# Patient Record
Sex: Male | Born: 1940 | Race: White | Hispanic: No | Marital: Married | Smoking: Former smoker
Health system: Southern US, Community
[De-identification: ages and names within clinical notes are randomized; demographics above are authoritative.]

## PROBLEM LIST (undated history)

## (undated) ENCOUNTER — Emergency Department (HOSPITAL_COMMUNITY): Payer: Medicare PPO

## (undated) DIAGNOSIS — I471 Supraventricular tachycardia, unspecified: Secondary | ICD-10-CM

## (undated) DIAGNOSIS — Z9289 Personal history of other medical treatment: Secondary | ICD-10-CM

## (undated) DIAGNOSIS — E7800 Pure hypercholesterolemia, unspecified: Secondary | ICD-10-CM

## (undated) DIAGNOSIS — M1990 Unspecified osteoarthritis, unspecified site: Secondary | ICD-10-CM

## (undated) DIAGNOSIS — R918 Other nonspecific abnormal finding of lung field: Secondary | ICD-10-CM

## (undated) DIAGNOSIS — I712 Thoracic aortic aneurysm, without rupture, unspecified: Secondary | ICD-10-CM

## (undated) DIAGNOSIS — Z8669 Personal history of other diseases of the nervous system and sense organs: Secondary | ICD-10-CM

## (undated) DIAGNOSIS — I259 Chronic ischemic heart disease, unspecified: Secondary | ICD-10-CM

## (undated) DIAGNOSIS — Z972 Presence of dental prosthetic device (complete) (partial): Secondary | ICD-10-CM

## (undated) DIAGNOSIS — I2510 Atherosclerotic heart disease of native coronary artery without angina pectoris: Secondary | ICD-10-CM

## (undated) DIAGNOSIS — Z8701 Personal history of pneumonia (recurrent): Secondary | ICD-10-CM

## (undated) DIAGNOSIS — I10 Essential (primary) hypertension: Secondary | ICD-10-CM

## (undated) DIAGNOSIS — Z973 Presence of spectacles and contact lenses: Secondary | ICD-10-CM

## (undated) DIAGNOSIS — K219 Gastro-esophageal reflux disease without esophagitis: Secondary | ICD-10-CM

## (undated) DIAGNOSIS — I4892 Unspecified atrial flutter: Secondary | ICD-10-CM

## (undated) DIAGNOSIS — I4819 Other persistent atrial fibrillation: Secondary | ICD-10-CM

## (undated) HISTORY — DX: Pure hypercholesterolemia, unspecified: E78.00

## (undated) HISTORY — PX: TONSILLECTOMY: SUR1361

## (undated) HISTORY — DX: Thoracic aortic aneurysm, without rupture, unspecified: I71.20

## (undated) HISTORY — DX: Supraventricular tachycardia, unspecified: I47.10

## (undated) HISTORY — DX: Personal history of other medical treatment: Z92.89

## (undated) HISTORY — DX: Chronic ischemic heart disease, unspecified: I25.9

## (undated) HISTORY — DX: Unspecified atrial flutter: I48.92

## (undated) HISTORY — PX: OTHER SURGICAL HISTORY: SHX169

## (undated) HISTORY — DX: Essential (primary) hypertension: I10

## (undated) HISTORY — DX: Thoracic aortic aneurysm, without rupture: I71.2

## (undated) HISTORY — PX: COLONOSCOPY: SHX174

## (undated) HISTORY — DX: Personal history of other diseases of the nervous system and sense organs: Z86.69

## (undated) HISTORY — DX: Other persistent atrial fibrillation: I48.19

## (undated) HISTORY — DX: Other nonspecific abnormal finding of lung field: R91.8

## (undated) HISTORY — PX: EYE SURGERY: SHX253

## (undated) HISTORY — DX: Supraventricular tachycardia: I47.1

---

## 1973-04-09 HISTORY — PX: RETINAL DETACHMENT SURGERY: SHX105

## 1995-04-10 HISTORY — PX: OTHER SURGICAL HISTORY: SHX169

## 1995-04-10 HISTORY — PX: ELBOW SURGERY: SHX618

## 1997-10-12 ENCOUNTER — Ambulatory Visit (HOSPITAL_COMMUNITY): Admission: RE | Admit: 1997-10-12 | Discharge: 1997-10-12 | Payer: Self-pay | Admitting: Neurological Surgery

## 1998-06-03 ENCOUNTER — Ambulatory Visit (HOSPITAL_BASED_OUTPATIENT_CLINIC_OR_DEPARTMENT_OTHER): Admission: RE | Admit: 1998-06-03 | Discharge: 1998-06-03 | Payer: Self-pay | Admitting: Orthopedic Surgery

## 2000-07-09 ENCOUNTER — Ambulatory Visit (HOSPITAL_COMMUNITY): Admission: RE | Admit: 2000-07-09 | Discharge: 2000-07-09 | Payer: Self-pay | Admitting: Gastroenterology

## 2002-04-09 HISTORY — PX: CORONARY STENT PLACEMENT: SHX1402

## 2002-06-22 ENCOUNTER — Ambulatory Visit (HOSPITAL_COMMUNITY): Admission: RE | Admit: 2002-06-22 | Discharge: 2002-06-23 | Payer: Self-pay | Admitting: Cardiology

## 2002-07-22 ENCOUNTER — Ambulatory Visit (HOSPITAL_COMMUNITY): Admission: RE | Admit: 2002-07-22 | Discharge: 2002-07-22 | Payer: Self-pay | Admitting: Cardiology

## 2002-07-22 ENCOUNTER — Encounter: Payer: Self-pay | Admitting: Cardiology

## 2003-09-03 ENCOUNTER — Ambulatory Visit (HOSPITAL_COMMUNITY): Admission: RE | Admit: 2003-09-03 | Discharge: 2003-09-03 | Payer: Self-pay | Admitting: Cardiology

## 2003-09-09 ENCOUNTER — Encounter: Admission: RE | Admit: 2003-09-09 | Discharge: 2003-09-09 | Payer: Self-pay | Admitting: Cardiology

## 2003-11-24 ENCOUNTER — Emergency Department (HOSPITAL_COMMUNITY): Admission: EM | Admit: 2003-11-24 | Discharge: 2003-11-24 | Payer: Self-pay | Admitting: Family Medicine

## 2004-04-17 ENCOUNTER — Encounter: Admission: RE | Admit: 2004-04-17 | Discharge: 2004-04-17 | Payer: Self-pay | Admitting: Internal Medicine

## 2004-09-05 ENCOUNTER — Emergency Department (HOSPITAL_COMMUNITY): Admission: EM | Admit: 2004-09-05 | Discharge: 2004-09-05 | Payer: Self-pay | Admitting: Nurse Practitioner

## 2005-11-05 ENCOUNTER — Encounter: Admission: RE | Admit: 2005-11-05 | Discharge: 2005-11-05 | Payer: Self-pay | Admitting: Cardiology

## 2005-12-06 ENCOUNTER — Encounter: Admission: RE | Admit: 2005-12-06 | Discharge: 2005-12-06 | Payer: Self-pay | Admitting: Internal Medicine

## 2006-01-01 ENCOUNTER — Ambulatory Visit (HOSPITAL_COMMUNITY): Admission: RE | Admit: 2006-01-01 | Discharge: 2006-01-01 | Payer: Self-pay | Admitting: Gastroenterology

## 2007-02-10 ENCOUNTER — Encounter: Admission: RE | Admit: 2007-02-10 | Discharge: 2007-02-10 | Payer: Self-pay | Admitting: Internal Medicine

## 2008-02-10 ENCOUNTER — Encounter: Admission: RE | Admit: 2008-02-10 | Discharge: 2008-02-10 | Payer: Self-pay | Admitting: Internal Medicine

## 2009-05-09 ENCOUNTER — Encounter: Admission: RE | Admit: 2009-05-09 | Discharge: 2009-05-09 | Payer: Self-pay | Admitting: Cardiology

## 2009-11-10 ENCOUNTER — Ambulatory Visit: Payer: Self-pay | Admitting: Cardiology

## 2009-11-20 ENCOUNTER — Encounter: Admission: RE | Admit: 2009-11-20 | Discharge: 2009-11-20 | Payer: Self-pay | Admitting: Internal Medicine

## 2010-02-10 ENCOUNTER — Ambulatory Visit: Payer: Self-pay | Admitting: Cardiology

## 2010-03-09 HISTORY — PX: CERVICAL FUSION: SHX112

## 2010-03-14 ENCOUNTER — Inpatient Hospital Stay (HOSPITAL_COMMUNITY)
Admission: RE | Admit: 2010-03-14 | Discharge: 2010-03-17 | Payer: Self-pay | Source: Home / Self Care | Attending: Neurosurgery | Admitting: Neurosurgery

## 2010-05-09 ENCOUNTER — Other Ambulatory Visit: Payer: Self-pay | Admitting: Cardiology

## 2010-05-09 DIAGNOSIS — I719 Aortic aneurysm of unspecified site, without rupture: Secondary | ICD-10-CM

## 2010-05-10 ENCOUNTER — Other Ambulatory Visit (INDEPENDENT_AMBULATORY_CARE_PROVIDER_SITE_OTHER): Payer: Medicare Other

## 2010-05-10 DIAGNOSIS — I2510 Atherosclerotic heart disease of native coronary artery without angina pectoris: Secondary | ICD-10-CM

## 2010-05-10 DIAGNOSIS — I10 Essential (primary) hypertension: Secondary | ICD-10-CM

## 2010-05-10 DIAGNOSIS — E7800 Pure hypercholesterolemia, unspecified: Secondary | ICD-10-CM

## 2010-05-12 ENCOUNTER — Ambulatory Visit
Admission: RE | Admit: 2010-05-12 | Discharge: 2010-05-12 | Disposition: A | Discharge status: Home / Self Care | Payer: Medicare Other | Source: Ambulatory Visit | Attending: Cardiology | Admitting: Cardiology

## 2010-05-12 DIAGNOSIS — I719 Aortic aneurysm of unspecified site, without rupture: Secondary | ICD-10-CM

## 2010-05-12 MED ORDER — IOHEXOL 300 MG/ML  SOLN: 125.0000 mL | Freq: Once | INTRAMUSCULAR | Status: AC | PRN

## 2010-05-29 ENCOUNTER — Ambulatory Visit (INDEPENDENT_AMBULATORY_CARE_PROVIDER_SITE_OTHER): Payer: Medicare Other | Admitting: Cardiology

## 2010-05-29 DIAGNOSIS — I471 Supraventricular tachycardia: Secondary | ICD-10-CM

## 2010-06-19 LAB — APTT: aPTT: 28 seconds (ref 24–37)

## 2010-06-19 LAB — URINALYSIS, ROUTINE W REFLEX MICROSCOPIC
Bilirubin Urine: NEGATIVE
Glucose, UA: NEGATIVE mg/dL
Protein, ur: NEGATIVE mg/dL
Specific Gravity, Urine: 1.021 (ref 1.005–1.030)
Urobilinogen, UA: 0.2 mg/dL (ref 0.0–1.0)

## 2010-06-19 LAB — DIFFERENTIAL
Basophils Absolute: 0 10*3/uL (ref 0.0–0.1)
Eosinophils Absolute: 0 10*3/uL (ref 0.0–0.7)
Eosinophils Relative: 1 % (ref 0–5)
Monocytes Absolute: 0.5 10*3/uL (ref 0.1–1.0)

## 2010-06-19 LAB — CBC
MCHC: 33.9 g/dL (ref 30.0–36.0)
Platelets: 224 10*3/uL (ref 150–400)
RBC: 4.77 MIL/uL (ref 4.22–5.81)
RDW: 12.7 % (ref 11.5–15.5)
WBC: 5.2 10*3/uL (ref 4.0–10.5)

## 2010-06-19 LAB — CROSSMATCH
ABO/RH(D): O POS
Unit division: 0
Unit division: 0

## 2010-06-19 LAB — COMPREHENSIVE METABOLIC PANEL
AST: 22 U/L (ref 0–37)
BUN: 17 mg/dL (ref 6–23)
Chloride: 107 mEq/L (ref 96–112)
Creatinine, Ser: 1.03 mg/dL (ref 0.4–1.5)
GFR calc Af Amer: >60 mL/min (ref >60)
Potassium: 3.9 mEq/L (ref 3.5–5.1)
Total Protein: 6.8 g/dL (ref 6.0–8.3)

## 2010-06-19 LAB — POCT I-STAT 4, (NA,K, GLUC, HGB,HCT)
Glucose, Bld: 119 mg/dL — ABNORMAL HIGH (ref 70–99)
HCT: 36 % — ABNORMAL LOW (ref 39.0–52.0)
Hemoglobin: 12.2 g/dL — ABNORMAL LOW (ref 13.0–17.0)
Potassium: 3.9 mEq/L (ref 3.5–5.1)

## 2010-06-19 LAB — PROTIME-INR
INR: 1.01 (ref 0.00–1.49)
Prothrombin Time: 13.5 seconds (ref 11.6–15.2)

## 2010-06-19 LAB — URINE MICROSCOPIC-ADD ON

## 2010-06-19 LAB — SURGICAL PCR SCREEN: Staphylococcus aureus: NEGATIVE

## 2010-06-19 LAB — GLUCOSE, CAPILLARY: Glucose-Capillary: 120 mg/dL — ABNORMAL HIGH (ref 70–99)
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
## 2010-08-25 NOTE — Cardiovascular Report (Signed)
NAME:  SPIKE, DESILETS NO.:  [id number]   MEDICAL RECORD NO.:  [id number]                   PATIENT TYPE:  OIB   LOCATION:  2870                                 FACILITY:  [facility]   PHYSICIAN:  Colleen Can. Deborah Chalk, M.D.            DATE OF BIRTH:  1940-07-24   DATE OF PROCEDURE:  06/22/2002  DATE OF DISCHARGE:                              CARDIAC CATHETERIZATION   HISTORY:  The patient presented with substernal chest pain, had abnormal  stress Cardiolite study.  He had no ischemia, but had a fixed inferior scar  without redistribution.  With that, he is referred for catheterization.   PROCEDURES:  1. Left heart catheterization with selective coronary angiography.  2. Left ventricular angiography.  3. Stent placement to the right coronary artery.   TYPE AND SITE OF ENTRY:  Percutaneous right femoral artery.   CATHETERS:  A 6 French 4 curved Judkins right and left coronary catheters, 6  French 5 curved Judkins left coronary catheter, a Amplatz L2 curved Judkins  left coronary catheter.   CONTRAST MATERIAL:  Omnipaque.   MEDICATIONS GIVEN PRIOR TO THE PROCEDURE:  Valium 10 mg p.o.   MEDICATIONS GIVEN DURING THE PROCEDURE:  Versed 2 mg IV, heparin,  Integrilin, IV nitroglycerin.   COMMENTS:  The patient tolerated the procedure well.  There is a dual ostium  of the left coronary artery that was difficult to engage and subsequently  was engaged with Amplatz catheters.   HEMODYNAMIC DATA:  The aortic pressure 114/93, LV is 107/1-22.  There was no  aortic gradient noted on pullback.   LEFT VENTRICULOGRAPHY:  Left ventricular angiogram was performed in the RAO  position.  Overall cardiac size and silhouette were normal.  There was mild  inferior hypokinesis.  The global ejection fraction would be estimated to be  55%.   CORONARY ARTERIOGRAPHY:  1. 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  There is a dual ostium of     the left circumflex and  left anterior descending.  2. Left circumflex:  The left circumflex arises with an intermediate     coronary with minimal irregularities.  The left circumflex continues as     an obtuse marginal and has 40-60% narrowing in its midportion.  There is     a small continuation branch of the left circumflex that has a subtotal     obstruction.  It would be small and would not be suitable for bypass     grafting. There clearly are irregularities in the main portion of this     obtuse marginal and proximal left circumflex.  3. Left anterior descending:  The left anterior descending was difficult to     engage.  There is no left main coronary artery present.  The left     anterior descending is a moderate sized and free of significant     obstructive disease.  4. Right coronary  artery:  The right coronary artery is a large dominant     vessel.  There is a 90% focal stenosis proximally before the first bend     in the branch.  There is a 60-70% stenosis in the midportion of the     posterior descending vessel, although the posterior descending is a     relatively small vessel.  There is a continuation branch in the right     coronary artery of the posterolateral branch and there is lumpy, bumpy     atherosclerosis scattered throughout the vessel.   ANGIOPLASTY PROCEDURE:  Using the JR4 guide as a pull guide, a 7 [place], we  easily placed the catheter in the ostia of the right coronary artery and Hi-  Torque Floppy guide wire was passed distally.  It was felt that a 3.5 x 18  mm stent was appropriate in size, although the proximal portion of the  vessel appeared to be 4.0 mm in diameter.  The stent was placed and inflated  to a maximum of 20 atmospheres.  Subsequently, returned with a 8 mm x 4.0 mm  Quantum Maverick balloon inflated to a maximum of 22 atmospheres to provide  a proximal flare to the stent of approximately 4.25 mm proximally.  The  final angiographic result was excellent with  essentially no residual  stenosis and no luminal irregularities after the stent was placed.   OVERALL IMPRESSION:  1. Well preserved global left ventricular function with minimal inferior     hypokinesis.  2. Successful stent placement to the proximal right coronary artery.  3. Residual disease in the posterior descending vessel and in the     continuation branch of the left circumflex with moderate proximal left     circumflex disease.  4. 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                                               Colleen Can. Deborah Chalk, M.D.    SNT/MEDQ  D:  06/22/2002  T:  06/22/2002  Job:  [id number]   cc:   Loraine Leriche A. Waynard Edwards, M.D.  [address]  [place]  [place] [address]  Fax: [phone]   Barry Dienes. Eloise Harman, M.D.  [address]  [place]  [place] [address]  Fax: [phone]
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
## 2010-08-25 NOTE — Cardiovascular Report (Signed)
NAME:  Ronald [name], DENZ NO.:  [id number]   MEDICAL RECORD NO.:  [id number]                   PATIENT TYPE:  OIB   LOCATION:  2861                                 FACILITY:  [facility]   PHYSICIAN:  Colleen Can. Deborah Chalk, M.D.            DATE OF BIRTH:  1941/04/03   DATE OF PROCEDURE:  09/03/2003  DATE OF DISCHARGE:  09/03/2003                              CARDIAC CATHETERIZATION   HISTORY:  Mr. Folz has had previous stent placement of the right coronary  artery.  He presents with atypical chest pain.   PROCEDURE:  1. Left heart catheterization with selective coronary angiography.  2. Left ventricular angiography.  3. Angio-Seal.   TYPE AND SITE OF ENTRY:  Percutaneous right femoral artery.   CATHETERS:  1. 6 French L2 and L3 Amplatz catheters.  2. 6 French right coronary catheter.  3. 6 French pigtail ventriculographic catheter.   CONTRAST MATERIAL:  Omnipaque.   COMMENTS:  The patient tolerated the procedure well.   HEMODYNAMIC DATA:  The aortic pressure was 112/73, LV was 114/15-18.   ANGIOGRAPHIC DATA:  1. The left main coronary artery is basically nonexistent and there is a     dual ostium to the left system.  2. Left circumflex:  There is a left circumflex with two large marginal     vessels.  The uppermost one would probably be what would be considered to     be an intermediate type distribution.  The lower marginal vessel is large     with a 30-40% narrowing.  There is initial concern that this may be more     severe, but in evaluation, there appears to be  branches that arise at     this location and there is no greater than 30-40% narrowing noted.  3. Left anterior descending.  Left anterior descending is a reasonably large     vessel that extends to the apex.  It has irregularities but no     significant focal narrowing.  4. Right coronary artery is a large dominant vessel.  The stent in the     proximal right coronary artery is widely  patent with no narrowing.     Posterior descending vessel has a 40-50% narrowing in midportion, there     is a very large posterolateral branch.   LEFT VENTRICULAR ANGIOGRAM:  Left ventricular angiogram was performed in the  RAO position.  Overall cardiac size was at the upper limits of normal.  The  regional wall motion was felt to be normal with a global ejection fraction  in the 50% range.  The aortic root was large and almost perpendicular to the  left ventricular cavity.   OVERALL IMPRESSION:  1. Normal left ventricular function.  2. Probable enlarged aortic root.  3. Patent stent in the right coronary artery with a distal 40-50% narrowing;     and a 40-50% narrowing in a left  circumflex marginal vessel.   DISCUSSION:  It is felt that Mr. Meenach probably does not have significant  ischemia or obstructive coronary disease at this point in time.  I think we  can manage him medically.  His aortic root is prominent and I would like to  make sure we don't have any excessive dilatation of his aortic root and have  a noninvasive way to monitor that and we will obtain a CT scan of the chest.                                               Colleen Can. Deborah Chalk, M.D.    SNT/MEDQ  D:  09/03/2003  T:  09/05/2003  Job:  [id number]

## 2010-09-29 ENCOUNTER — Other Ambulatory Visit: Payer: Self-pay | Admitting: Cardiovascular Disease

## 2010-09-29 DIAGNOSIS — I714 Abdominal aortic aneurysm, without rupture: Secondary | ICD-10-CM

## 2010-11-09 ENCOUNTER — Ambulatory Visit: Payer: Medicare Other | Admitting: Cardiovascular Disease

## 2010-11-13 ENCOUNTER — Encounter: Payer: Self-pay | Admitting: *Deleted

## 2010-11-14 ENCOUNTER — Ambulatory Visit (INDEPENDENT_AMBULATORY_CARE_PROVIDER_SITE_OTHER): Payer: Medicare Other | Admitting: Cardiovascular Disease

## 2010-11-14 ENCOUNTER — Encounter: Payer: Self-pay | Admitting: Cardiovascular Disease

## 2010-11-14 VITALS — BP 102/72 | HR 62 | Ht 79.0 in

## 2010-11-14 DIAGNOSIS — I712 Thoracic aortic aneurysm, without rupture, unspecified: Secondary | ICD-10-CM

## 2010-11-14 DIAGNOSIS — E785 Hyperlipidemia, unspecified: Secondary | ICD-10-CM

## 2010-11-14 DIAGNOSIS — I2510 Atherosclerotic heart disease of native coronary artery without angina pectoris: Secondary | ICD-10-CM

## 2010-11-14 LAB — LIPID PANEL
HDL: 48.6 mg/dL (ref >39.00)
LDL Cholesterol: 107 mg/dL — ABNORMAL HIGH (ref 0–99)
Total CHOL/HDL Ratio: 4

## 2010-11-14 LAB — HEPATIC FUNCTION PANEL
AST: 24 U/L (ref 0–37)
Alkaline Phosphatase: 52 U/L (ref 39–117)
Total Bilirubin: 0.9 mg/dL (ref 0.3–1.2)

## 2010-11-14 NOTE — Patient Instructions (Signed)
Your physician recommends that you schedule a follow-up appointment in: 6 months  Your physician has requested that you have an echocardiogram. Echocardiography is a painless test that uses sound waves to create images of your heart. It provides your doctor with information about the size and shape of your heart and how well your heart's chambers and valves are working. This procedure takes approximately one hour. There are no restrictions for this procedure.    

## 2010-11-14 NOTE — Assessment & Plan Note (Addendum)
Stable. No changes. Will get echo to assess LV function. Will check lipids today.

## 2010-11-14 NOTE — Assessment & Plan Note (Signed)
Stable by CTA February 2012. Will repeat February 2013.

## 2010-11-14 NOTE — Progress Notes (Signed)
History of Present Illness:69 yo WM with h/o CAD s/p stent RCA 2004, HTN, HLD, dilated aortic root (Ascending aortic aneurysm)  here today for cardiac follow up. He has been followed in the past by Dr. Deborah Chalk. His last CTA chest was February 2012. Last stress test was 3/10 and no ischemia.   He has had no chest pain or SOB. He is not limited by any dizziness, weakness. He does not tolerate statins which caused muscle aches and cramping.   Past Medical History  Diagnosis Date  . Hypertension   . Hypercholesteremia     managed by dr Waynard Edwards  . 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   . Thoracic aortic aneurysm     Past Surgical History  Procedure Date  . Cervical fusion     c2-c7  . Rotator cuff surgery   . Elbow surgery     Current Outpatient Prescriptions  Medication Sig Dispense Refill  . aspirin 325 MG tablet Take 325 mg by mouth daily.        . Cholecalciferol (VITAMIN D) 1000 UNITS capsule Take 1,000 Units by mouth daily.        . Coenzyme Q10 (COQ-10) 200 MG CAPS Take 1 capsule by mouth daily.        . colesevelam (WELCHOL) 625 MG tablet Take 1,875 mg by mouth 2 (two) times daily with a meal.        . ezetimibe (ZETIA) 10 MG tablet Take 5 mg by mouth daily.        . fenofibrate 160 MG tablet Take 160 mg by mouth daily.        . Glucosamine-Chondroit-Vit C-Mn (GLUCOSAMINE CHONDR 1500 COMPLX) CAPS Take 2 capsules by mouth daily.        Marland Kitchen lisinopril (PRINIVIL,ZESTRIL) 40 MG tablet Take 20 mg by mouth daily.        . Omega-3 Fatty Acids (FISH OIL) 1000 MG CAPS Take 6 capsules by mouth daily.        Marland Kitchen omeprazole (PRILOSEC) 20 MG capsule Take 20 mg by mouth daily.          Allergies  Allergen Reactions  . Statins   . Phenergan     History   Social History  . Marital Status: Married    Spouse Name: N/A    Number of Children: 1  . Years of Education: N/A   Occupational  History  . retired Engineer, site    Social History Main Topics  . Smoking status: Former Games developer  . Smokeless tobacco: Former Neurosurgeon  . Alcohol Use: Yes     occasional  . Drug Use: Not on file  . Sexually Active: Not on file   Other Topics Concern  . Not on file   Social History Narrative  . No narrative on file    Family History  Problem Relation Age of Onset  . Heart disease Father     Review of Systems:  As stated in the HPI and otherwise negative.   BP 102/72  Pulse 62  Ht 6\' 7"  (2.007 m)  Physical Examination: General: Well developed, well nourished, NAD HEENT: OP clear, mucus membranes moist SKIN: warm, dry. No rashes. Neuro: No focal deficits Musculoskeletal: Muscle strength 5/5 all ext Psychiatric: Mood and affect normal Neck: No JVD, no carotid bruits, no thyromegaly, no lymphadenopathy. Lungs:Clear bilaterally, no wheezes, rhonci,  crackles Cardiovascular: Regular rate and rhythm. No murmurs, gallops or rubs. Abdomen:Soft. Bowel sounds present. Non-tender.  Extremities: No lower extremity edema. Pulses are 2 + in the bilateral DP/PT.  EKG:NSR, rate 62 bpm. Incomplete RBBB.

## 2010-11-15 ENCOUNTER — Telehealth: Payer: Self-pay | Admitting: Cardiovascular Disease

## 2010-11-15 NOTE — Telephone Encounter (Signed)
Rescheduled patients blood work.

## 2010-11-15 NOTE — Telephone Encounter (Signed)
Pt has lab work Friday and was in yesterday, had lab work , wants to make sure he still needs to come friday

## 2010-11-16 ENCOUNTER — Encounter: Payer: Self-pay | Admitting: *Deleted

## 2010-11-17 ENCOUNTER — Other Ambulatory Visit: Payer: Medicare Other | Admitting: *Deleted

## 2010-11-27 ENCOUNTER — Ambulatory Visit (INDEPENDENT_AMBULATORY_CARE_PROVIDER_SITE_OTHER): Payer: Medicare Other | Admitting: *Deleted

## 2010-11-27 ENCOUNTER — Ambulatory Visit (HOSPITAL_COMMUNITY): Payer: Medicare Other | Attending: Cardiovascular Disease | Admitting: Radiology

## 2010-11-27 DIAGNOSIS — I712 Thoracic aortic aneurysm, without rupture, unspecified: Secondary | ICD-10-CM | POA: Insufficient documentation

## 2010-11-27 DIAGNOSIS — E785 Hyperlipidemia, unspecified: Secondary | ICD-10-CM | POA: Insufficient documentation

## 2010-11-27 DIAGNOSIS — I079 Rheumatic tricuspid valve disease, unspecified: Secondary | ICD-10-CM | POA: Insufficient documentation

## 2010-11-27 DIAGNOSIS — I2510 Atherosclerotic heart disease of native coronary artery without angina pectoris: Secondary | ICD-10-CM

## 2010-11-27 DIAGNOSIS — I10 Essential (primary) hypertension: Secondary | ICD-10-CM | POA: Insufficient documentation

## 2010-11-27 DIAGNOSIS — I080 Rheumatic disorders of both mitral and aortic valves: Secondary | ICD-10-CM | POA: Insufficient documentation

## 2010-11-27 LAB — BASIC METABOLIC PANEL
CO2: 27 mEq/L (ref 19–32)
Calcium: 9.4 mg/dL (ref 8.4–10.5)
Creatinine, Ser: 1 mg/dL (ref 0.4–1.5)
GFR: 78.08 mL/min (ref >60.00)
Glucose, Bld: 115 mg/dL — ABNORMAL HIGH (ref 70–99)
Sodium: 142 mEq/L (ref 135–145)

## 2011-05-11 ENCOUNTER — Other Ambulatory Visit: Payer: Self-pay | Admitting: Cardiovascular Disease

## 2011-05-11 LAB — CREATININE, SERUM: Creat: 0.9 mg/dL (ref 0.50–1.35)

## 2011-05-14 ENCOUNTER — Ambulatory Visit
Admission: RE | Admit: 2011-05-14 | Discharge: 2011-05-14 | Disposition: A | Discharge status: Home / Self Care | Payer: Medicare Other | Source: Ambulatory Visit | Attending: Cardiovascular Disease | Admitting: Cardiovascular Disease

## 2011-05-14 DIAGNOSIS — I714 Abdominal aortic aneurysm, without rupture: Secondary | ICD-10-CM

## 2011-05-14 MED ORDER — IOHEXOL 300 MG/ML  SOLN
100.0000 mL | Freq: Once | INTRAMUSCULAR | Status: AC | PRN
  Administered 2011-05-14: 100 mL via INTRAVENOUS

## 2011-05-17 ENCOUNTER — Ambulatory Visit: Payer: Medicare Other | Admitting: Cardiovascular Disease

## 2011-06-04 ENCOUNTER — Ambulatory Visit (INDEPENDENT_AMBULATORY_CARE_PROVIDER_SITE_OTHER): Payer: Medicare Other | Admitting: Cardiovascular Disease

## 2011-06-04 ENCOUNTER — Encounter: Payer: Self-pay | Admitting: Cardiovascular Disease

## 2011-06-04 VITALS — BP 127/76 | HR 68 | Wt 289.8 lb

## 2011-06-04 DIAGNOSIS — I712 Thoracic aortic aneurysm, without rupture, unspecified: Secondary | ICD-10-CM

## 2011-06-04 DIAGNOSIS — I2510 Atherosclerotic heart disease of native coronary artery without angina pectoris: Secondary | ICD-10-CM

## 2011-06-04 NOTE — Progress Notes (Signed)
History of Present Illness: 71 yo WM with h/o CAD s/p stent RCA 2004, HTN, HLD, dilated aortic root (Ascending aortic aneurysm) here today for cardiac follow up. He has been followed in the past by Dr. Deborah Chalk. His last CTA chest was February 2012. Last stress test was March 2010 and there was no ischemia. Echo August 2012 with LVEF 55-60%. CTA chest outlined below with 5.0 cm thoracic aortic aneurysm. This has grown 0.6 mm in the last year.   He tells me today that he has had no chest pain or SOB. He is not limited by any dizziness, weakness. He does not tolerate statins which caused muscle aches and cramping. He has been active.    Primary Care Physician: [name]  Last Lipid Profile: August 2012 with LDL 107, total chol 143  HDL 49  Past Medical History  Diagnosis Date  . Hypertension   . Hypercholesteremia     managed by dr Waynard Edwards  . 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   . Thoracic aortic aneurysm     Past Surgical History  Procedure Date  . Cervical fusion     c2-c7  . Rotator cuff surgery   . Elbow surgery   . Retinal detachment surgery     Current Outpatient Prescriptions  Medication Sig Dispense Refill  . aspirin 325 MG tablet Take 325 mg by mouth daily.        . Cholecalciferol (VITAMIN D) 1000 UNITS capsule Take 1,000 Units by mouth daily.        . Coenzyme Q10 (COQ-10) 200 MG CAPS Take 1 capsule by mouth daily.        . colesevelam (WELCHOL) 625 MG tablet Take 1,875 mg by mouth 2 (two) times daily with a meal.        . ezetimibe (ZETIA) 10 MG tablet Take 5 mg by mouth daily.        . fenofibrate 160 MG tablet Take 160 mg by mouth daily.        . Glucosamine-Chondroit-Vit C-Mn (GLUCOSAMINE CHONDR 1500 COMPLX) CAPS Take 2 capsules by mouth daily.        Marland Kitchen lisinopril (PRINIVIL,ZESTRIL) 40 MG tablet Take 20 mg by mouth daily.        . Omega-3 Fatty Acids (FISH OIL)  1000 MG CAPS Take 6 capsules by mouth daily.        Marland Kitchen omeprazole (PRILOSEC) 20 MG capsule Take 20 mg by mouth daily.          Allergies  Allergen Reactions  . Statins   . Phenergan     History   Social History  . Marital Status: Married    Spouse Name: N/A    Number of Children: 1  . Years of Education: N/A   Occupational History  . retired Engineer, site    Social History Main Topics  . Smoking status: Former Games developer  . Smokeless tobacco: Former Neurosurgeon  . Alcohol Use: Yes     occasional  . Drug Use: Not on file  . Sexually Active: Not on file   Other Topics Concern  . Not on file   Social History Narrative  . No narrative on file    Family History  Problem Relation Age of Onset  . Heart disease Father     Review of Systems:  As stated in the HPI  and otherwise negative.   BP 127/76  Pulse 68  Wt 289 lb 12.8 oz (131.452 kg)  Physical Examination: General: Well developed, well nourished, NAD HEENT: OP clear, mucus membranes moist SKIN: warm, dry. No rashes. Neuro: No focal deficits Musculoskeletal: Muscle strength 5/5 all ext Psychiatric: Mood and affect normal Neck: No JVD, no carotid bruits, no thyromegaly, no lymphadenopathy. Lungs:Clear bilaterally, no wheezes, rhonci, crackles Cardiovascular: Regular rate and rhythm. No murmurs, gallops or rubs. Abdomen:Soft. Bowel sounds present. Non-tender.  Extremities: No lower extremity edema. Pulses are 2 + in the bilateral DP/PT.

## 2011-06-04 NOTE — Assessment & Plan Note (Signed)
Stable. No angina. He does not tolerate statins. No changes

## 2011-06-04 NOTE — Patient Instructions (Signed)
Your physician wants you to follow-up in: 6 months.  You will receive a reminder letter in the mail two months in advance. If you don't receive a letter, please call our office to schedule the follow-up appointment.  You have been referred to Dr. Tyrone Sage at [facility]

## 2011-06-04 NOTE — Assessment & Plan Note (Addendum)
His thoracic aortic aneurysm is now 5.0 cm and has grown 0.33mm in the last year. He is asymptomatic. Will refer to Dr. Tyrone Sage for consideration for aortic root replacement. BP is well controlled.

## 2011-06-06 ENCOUNTER — Ambulatory Visit (INDEPENDENT_AMBULATORY_CARE_PROVIDER_SITE_OTHER): Payer: Medicare Other | Admitting: Nurse Practitioner

## 2011-06-06 ENCOUNTER — Other Ambulatory Visit: Payer: Self-pay | Admitting: Nurse Practitioner

## 2011-06-06 ENCOUNTER — Encounter: Payer: Self-pay | Admitting: Cardiothoracic Surgery

## 2011-06-06 ENCOUNTER — Institutional Professional Consult (permissible substitution) (INDEPENDENT_AMBULATORY_CARE_PROVIDER_SITE_OTHER): Payer: Medicare Other | Admitting: Cardiothoracic Surgery

## 2011-06-06 ENCOUNTER — Encounter: Payer: Self-pay | Admitting: Nurse Practitioner

## 2011-06-06 VITALS — BP 129/79 | HR 77 | Resp 16 | Ht 79.0 in | Wt 285.0 lb

## 2011-06-06 VITALS — BP 152/96 | HR 66 | Resp 20

## 2011-06-06 DIAGNOSIS — Z01810 Encounter for preprocedural cardiovascular examination: Secondary | ICD-10-CM

## 2011-06-06 DIAGNOSIS — I712 Thoracic aortic aneurysm, without rupture: Secondary | ICD-10-CM

## 2011-06-06 DIAGNOSIS — R079 Chest pain, unspecified: Secondary | ICD-10-CM

## 2011-06-06 DIAGNOSIS — I2510 Atherosclerotic heart disease of native coronary artery without angina pectoris: Secondary | ICD-10-CM

## 2011-06-06 DIAGNOSIS — Z01818 Encounter for other preprocedural examination: Secondary | ICD-10-CM

## 2011-06-06 DIAGNOSIS — IMO0001 Reserved for inherently not codable concepts without codable children: Secondary | ICD-10-CM

## 2011-06-06 NOTE — Progress Notes (Signed)
 Ronald [name] Date of Birth: 03/12/1941 Medical Record #[id number]  History of Present Illness: Ronald [name] is seen today as a work in visit. He is seen for Dr. McAlhany. He was here earlier this week to establish his cardiology care. He is a former patient of Dr. Tennant's. He has been doing well from a cardiac standpoint with no symptoms. He has had recent CT scan to follow up on his thoracic aneurysm. This has showed a change over the last year. He was referred to Dr. Rondo Spittler and saw him earlier this afternoon. Surgery has been advised. He will need to be recathed. He presents here to schedule this procedure. He is currently having no symptoms. No chest pain or shortness of breath. Blood pressure has been good at home.   Current Outpatient Prescriptions on File Prior to Visit  Medication Sig Dispense Refill  . aspirin 325 MG tablet Take 325 mg by mouth daily.        . Cholecalciferol (VITAMIN D) 1000 UNITS capsule Take 1,000 Units by mouth daily.        . Coenzyme Q10 (COQ-10) 200 MG CAPS Take 1 capsule by mouth daily.        . colesevelam (WELCHOL) 625 MG tablet Take 1,875 mg by mouth 2 (two) times daily with a meal.        . ezetimibe (ZETIA) 10 MG tablet Take 5 mg by mouth daily.        . Glucosamine-Chondroit-Vit C-Mn (GLUCOSAMINE CHONDR 1500 COMPLX) CAPS Take 2 capsules by mouth daily.        . lisinopril (PRINIVIL,ZESTRIL) 40 MG tablet Take 20 mg by mouth daily.        . Omega-3 Fatty Acids (FISH OIL) 1000 MG CAPS Take 6 capsules by mouth daily.        . omeprazole (PRILOSEC) 20 MG capsule Take 20 mg by mouth daily.          Allergies  Allergen Reactions  . Vesicare (Solifenacin Succinate) Itching and Rash  . Statins Other (See Comments)    myalgias  . Phenergan     Agitation and incoherence    Past Medical History  Diagnosis Date  . Hypertension   . Hypercholesteremia     managed by dr perini  . 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   . Thoracic aortic aneurysm     last scan in Jan 2013. Now measuring 5.0cm. Referred to Dr. Trino Higinbotham  . Malaria   . SVT (supraventricular tachycardia)     remote episode during exercise test    Past Surgical History  Procedure Date  . Cervical fusion 03-2010    c2-c7  . Rotator cuff surgery 1997  . Elbow surgery 1997  . Retinal detachment surgery 1975  . Coronary stent placement 2004    History  Smoking status  . Former Smoker  . Quit date: 04/09/1969  Smokeless tobacco  . Former User  . Types: Chew  . Quit date: 03/09/2010    History  Alcohol Use  . 0.5 oz/week  . 1 drink(s) per week    occasional    Family History  Problem Relation Age of Onset  . Heart disease Father     Review of Systems: The review of systems is per the HPI.  All other systems were reviewed and are negative.  Physical Exam: BP   152/96  Pulse 66  Resp 20 Patient is very pleasant and in no acute distress. He is tall. Skin is warm and dry. Color is normal.  HEENT is unremarkable. Normocephalic/atraumatic. PERRL. Sclera are nonicteric. Neck is supple. No masses. No JVD. Lungs are clear. Cardiac exam shows a regular rate and rhythm. Abdomen is soft. Extremities are without edema. Gait and ROM are intact. No gross neurologic deficits noted.  LABORATORY DATA: Pending  Ct Angio Chest W/cm &/or Wo Cm  05/14/2011  *RADIOLOGY REPORT*  Clinical Data: Ascending aortic aneurysm.  CT ANGIOGRAPHY CHEST  Technique:  Multidetector CT imaging of the chest using the standard protocol during bolus administration of intravenous contrast. Multiplanar reconstructed images including MIPs were obtained and reviewed to evaluate the vascular anatomy.  Contrast: 100mL OMNIPAQUE IOHEXOL 300 MG/ML IV SOLN  Comparison: Chest c t a to 06/2010.  Findings:  Mediastinum: When compared to the prior examination, the size of the ascending thoracic aortic aneurysm has  slightly increased, currently measuring approximately 5 cm in diameter (measured on images 69 and 74 of series 5).  The mid arch measures 3.4 cm (previously approximately 3.0 cm).  Descending thoracic aorta measures approximately 2.8 cm at the level of the left main pulmonary artery (unchanged).  Mild focal dilatation of the isthmus (3.9 cm) is unchanged as well.  No evidence of aortic dissection at this time.  Sinuses of Valsalva also appeared dilated (measuring approximately 5.2 cm on coronal images); please note that the accuracy of this measurement is exceedingly limited on a non gated examination). There is atherosclerosis of the thoracic aorta, the great vessels of the mediastinum and the coronary arteries, including calcified atherosclerotic plaque in the left anterior descending, left circumflex and right coronary arteries. Heart size is mildly enlarged. There is no significant pericardial fluid, thickening or pericardial calcification. No pathologically enlarged mediastinal or hilar lymph nodes. Esophagus is unremarkable in appearance.   .  Lungs/Pleura: As with prior examination as there are numerous tiny sub 4 mm pulmonary nodules scattered throughout the lungs bilaterally that are unchanged in size, number and distribution compared to a remote prior studies 11/05/2005, and at this time these can be considered radiographically benign. There are otherwise no new or enlarging suspicious appearing pulmonary nodules or masses identified on today's CT examination.  No consolidative airspace disease.  No pleural effusions.  Upper Abdomen: Visualized portions of the upper abdomen are unremarkable.  Musculoskeletal: There are no aggressive appearing lytic or blastic lesions noted in the visualized portions of the skeleton. Orthopedic fixation hardware in the lower cervical spine incompletely imaged.  IMPRESSION:  1.  Increase in size of the ascending thoracic aortic aneurysm, which currently measures 5 cm in  diameter (and has grown 6 mm in the prior year). There is also dilatation of the aortic root, as above (please note that accurate measurement of the root is not possible on this non gated CT examination). 2.  Atherosclerosis, including three-vessel coronary artery disease. Please note that although the presence of coronary artery calcium documents the presence of coronary artery disease, the severity of this disease and any potential stenosis cannot be assessed on this non-gated CT examination.  Assessment for potential risk factor modification, dietary therapy or pharmacologic therapy may be warranted, if clinically indicated. 3.  Unchanged tiny (4 mm and less) pulmonary nodules scattered throughout the lungs bilaterally, which could be considered radiographically benign and is these are stable compared to prior study from 2007.  No imaging follow-up for these nodules   is needed in the future.  Original Report Authenticated By: DANIEL W. ENTRIKIN, M.D.   Assessment / Plan:  

## 2011-06-06 NOTE — Patient Instructions (Signed)
You are scheduled for an outpatient cardiac catheterization on Friday, March 1st with Dr. Clifton Mclane.  Go the [facility] at [facility] on Friday, March 1st at 9:30am.  Call the Heart & Vascular Center at [phone] if you are unable to make your appointment.  The code to get into the parking garage under the building is 6000. You must have someone available to drive you home. Someone needs to be with you for the first 24 hours after you arrive home. Please wear clothes that are easy to get on and off.  Do not eat or drink after midnight on Thursday. You may have water only with your medications on the morning of your procedure.

## 2011-06-06 NOTE — Assessment & Plan Note (Signed)
Currently being evaluated by Dr. Tyrone Sage. Surgery has been recommended. Will proceed with cardiac cath.

## 2011-06-06 NOTE — Progress Notes (Deleted)
[address]            [place] [address]          [phone]      ZINEDINE ELLNER [facility] Medical Record #[id number] Date of Birth: 10-Jul-1940  Referring: Verne Carrow, * Primary Care: Ezequiel Kayser, MD, MD  Chief Complaint:    Chief Complaint  Patient presents with  . Thoracic Aortic Aneurysm    eval and treat...ct chest 05/14/11    History of Present Illness:          Current Activity/ Functional Status: {functional status:19517}   Past Medical History  Diagnosis Date  . Hypertension   . Hypercholesteremia     managed by dr Waynard Edwards  . 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   . Thoracic aortic aneurysm     Past Surgical History  Procedure Date  . Cervical fusion     c2-c7  . Rotator cuff surgery   . Elbow surgery   . Retinal detachment surgery     Family History  Problem Relation Age of Onset  . Heart disease Father     History   Social History  . Marital Status: Married    Spouse Name: N/A    Number of Children: 1  . Years of Education: N/A   Occupational History  . retired Engineer, site    Social History Main Topics  . Smoking status: Former Games developer  . Smokeless tobacco: Former Neurosurgeon  . Alcohol Use: 0.5 oz/week    1 drink(s) per week     occasional  . Drug Use: No  . Sexually Active: Not on file   Other Topics Concern  . Not on file   Social History Narrative  . No narrative on file    History  Smoking status  . Former Smoker  Smokeless tobacco  . Former Neurosurgeon    History  Alcohol Use  . 0.5 oz/week  . 1 drink(s) per week    occasional     Allergies  Allergen Reactions  . Statins Other (See Comments)    myalgias  . Phenergan     Agitation and incoherence    Current Outpatient Prescriptions  Medication Sig Dispense Refill  . aspirin 325 MG tablet Take 325 mg by mouth daily.         . Cholecalciferol (VITAMIN D) 1000 UNITS capsule Take 1,000 Units by mouth daily.        . Coenzyme Q10 (COQ-10) 200 MG CAPS Take 1 capsule by mouth daily.        . colesevelam (WELCHOL) 625 MG tablet Take 1,875 mg by mouth 2 (two) times daily with a meal.        . ezetimibe (ZETIA) 10 MG tablet Take 5 mg by mouth daily.        . Glucosamine-Chondroit-Vit C-Mn (GLUCOSAMINE CHONDR 1500 COMPLX) CAPS Take 2 capsules by mouth daily.        Marland Kitchen lisinopril (PRINIVIL,ZESTRIL) 40 MG tablet Take 20 mg by mouth daily.        . Omega-3 Fatty Acids (FISH OIL) 1000 MG CAPS Take 6 capsules by mouth daily.        Marland Kitchen omeprazole (PRILOSEC) 20 MG capsule Take 20 mg by mouth daily.  Review of Systems:     Cardiac Review of Systems: Y or N  Chest Pain [  n  ]  Resting SOB [  n ] Exertional SOB  [ n ]  Orthopnea [n  ]   Pedal Edema [ y  ]    Palpitations [n  ] Syncope  [ n ]   Presyncope [  n ]  General Review of Systems: [Y] = yes [  ]=no Constitional: recent weight change [  n]; anorexia [ n ]; fatigue [n  ]; nausea [ n ]; night sweats [n  ]; fever [ n ]; or chills [  ];                                                                                                                                          Dental: poor dentition[  ]; Last Dentist visit: ***  Eye : blurred vision [  ]; diplopia [   ]; vision changes [  ];  Amaurosis fugax[  ]; Resp: cough [  ];  wheezing[  ];  hemoptysis[  ]; shortness of breath[  ]; paroxysmal nocturnal dyspnea[  ]; dyspnea on exertion[  ]; or orthopnea[  ];  GI:  gallstones[  ], vomiting[  ];  dysphagia[  ]; melena[  ];  hematochezia [  ]; heartburn[  ];   Hx of  Colonoscopy[ y ]; GU: kidney stones [  ]; hematuria[n  ];   dysuria [ n ];  nocturia[  ];  history of     obstruction [  ];             Skin: rash, swelling[  ];, hair loss[  ];  peripheral edema[  ];  or itching[  ]; Musculosketetal: myalgias[  ];  joint swelling[  ];  joint erythema[  ];  joint  pain[  ];  back pain[  ];  Heme/Lymph: bruising[  ];  bleeding[  ];  anemia[  ];  Neuro: TIA[  n];  headaches[  ];  stroke[ n ];  vertigo[  ];  seizures[n  ];   paresthesias[  ];  difficulty walking[  ];  Psych:depression[  ]; anxiety[  ];  Endocrine: diabetes[  ];  thyroid dysfunction[n  ];  Immunizations: Flu [ y ]; Pneumococcal[y  ]; also shingles  Other:  Physical Exam: BP 129/79  Pulse 77  Resp 16  Ht 6\' 7"  (2.007 m)  Wt 285 lb (129.275 kg)  BMI 32.11 kg/m2  SpO2 94%  {Physical Exam:3041110}   Diagnostic Studies & Laboratory data:     Recent Radiology Findings:  Ct Angio Chest W/cm &/or Wo Cm  05/14/2011  *RADIOLOGY REPORT*  Clinical Data: Ascending aortic aneurysm.  CT ANGIOGRAPHY CHEST  Technique:  Multidetector CT imaging of the chest using the standard protocol during bolus administration of intravenous contrast. Multiplanar reconstructed images including MIPs were obtained  and reviewed to evaluate the vascular anatomy.  Contrast: OMNIPAQUE IOHEXOL 300 MG/ML IV SOLN  Comparison: Chest c t a to 06/2010.  Findings:  Mediastinum: When compared to the prior examination, the size of the ascending thoracic aortic aneurysm has slightly increased, currently measuring approximately 5 cm in diameter (measured on images 69 and 74 of series 5).  The mid arch measures 3.4 cm (previously approximately 3.0 cm).  Descending thoracic aorta measures approximately 2.8 cm at the level of the left main pulmonary artery (unchanged).  Mild focal dilatation of the isthmus (3.9 cm) is unchanged as well.  No evidence of aortic dissection at this time.  Sinuses of Valsalva also appeared dilated (measuring approximately 5.2 cm on coronal images); please note that the accuracy of this measurement is exceedingly limited on a non gated examination). There is atherosclerosis of the thoracic aorta, the great vessels of the mediastinum and the coronary arteries, including calcified atherosclerotic plaque in the  left anterior descending, left circumflex and right coronary arteries. Heart size is mildly enlarged. There is no significant pericardial fluid, thickening or pericardial calcification. No pathologically enlarged mediastinal or hilar lymph nodes. Esophagus is unremarkable in appearance.   .  Lungs/Pleura: As with prior examination as there are numerous tiny sub 4 mm pulmonary nodules scattered throughout the lungs bilaterally that are unchanged in size, number and distribution compared to a remote prior studies 11/05/2005, and at this time these can be considered radiographically benign. There are otherwise no new or enlarging suspicious appearing pulmonary nodules or masses identified on today's CT examination.  No consolidative airspace disease.  No pleural effusions.  Upper Abdomen: Visualized portions of the upper abdomen are unremarkable.  Musculoskeletal: There are no aggressive appearing lytic or blastic lesions noted in the visualized portions of the skeleton. Orthopedic fixation hardware in the lower cervical spine incompletely imaged.  IMPRESSION:  1.  Increase in size of the ascending thoracic aortic aneurysm, which currently measures 5 cm in diameter (and has grown 6 mm in the prior year). There is also dilatation of the aortic root, as above (please note that accurate measurement of the root is not possible on this non gated CT examination). 2.  Atherosclerosis, including three-vessel coronary artery disease. Please note that although the presence of coronary artery calcium documents the presence of coronary artery disease, the severity of this disease and any potential stenosis cannot be assessed on this non-gated CT examination.  Assessment for potential risk factor modification, dietary therapy or pharmacologic therapy may be warranted, if clinically indicated. 3.  Unchanged tiny (4 mm and less) pulmonary nodules scattered throughout the lungs bilaterally, which could be considered radiographically  benign and is these are stable compared to prior study from 2007.  No imaging follow-up for these nodules is needed in the future.  Original Report Authenticated By: Florencia Reasons, M.D.    Recent Lab Findings: Lab Results  Component Value Date   WBC 5.2 03/10/2010   HGB 12.2* 03/14/2010   HCT 36.0* 03/14/2010   PLT 224 03/10/2010   GLUCOSE 115* 11/27/2010   CHOL 184 11/14/2010   TRIG 143.0 11/14/2010   HDL 48.60 11/14/2010   LDLCALC 107* 11/14/2010   ALT 23 11/14/2010   AST 24 11/14/2010   NA 142 11/27/2010   K 4.7 11/27/2010   CL 108 11/27/2010   CREATININE 0.90 05/11/2011   BUN 16 05/11/2011   CO2 27 11/27/2010   INR 1.01 03/10/2010      Assessment / Plan:  Delight Ovens MD  Beeper [phone] Office [phone] 06/06/2011 2:39 PM

## 2011-06-06 NOTE — Progress Notes (Signed)
[address]            [place] [id number]          [phone]      BRADEN [facility] Medical Record #[id number] Date of Birth: 1940/05/06  Referring: Verne Carrow, * Primary Care: Ezequiel Kayser, MD, MD  Chief Complaint:    Chief Complaint  Patient presents with  . Thoracic Aortic Aneurysm    eval and treat...ct chest 05/14/11    History of Present Illness:    Patient is a 71 year old male with known coronary occlusive disease having a stents placed in his right coronary artery 7to 8 years ago. He is also known to have a dilated ascending aorta this is been followed since 2004 with serial CT scans. The most recent scan 2013 that showed his descending aorta to dilate to 5 cm. Scans dating back to 2004 had been very consistent without change at 4.5 cm. Because of the change over the past year and previously the size of been stable for 8 years precipitated evaluation by cardiac surgery. The patient has no history of aortic insufficiency has no signs or symptoms of congestive heart failure he has no angina.  There is no family history of aortic aneurysm or aortic dissections. His father died at age 54 in 74 sudden death while at a picnic attributed to "cardiac spasm".    Current Activity/ Functional Status: Patient is independent with mobility/ambulation, transfers, ADL's, IADL's.   Past Medical History  Diagnosis Date  . Hypertension   . Hypercholesteremia     managed by dr Waynard Edwards  . 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   . Thoracic aortic aneurysm     last scan in Jan 2013. Now measuring 5.0cm. Referred to Dr. Tyrone Sage  . Malaria  patient had 3 episodes of malaria while serving in [place]   . SVT (supraventricular tachycardia)     remote episode during exercise test  . Lung nodules     noted on past CT scans    Past  Surgical History  Procedure Date  . Cervical fusion 03-2010    c2-c7  . Rotator cuff surgery 1997  . Elbow surgery 1997  . Retinal detachment surgery 1975  . Coronary stent placement 2004    Family History  Problem Relation Age of Onset  . Heart disease Father     History   Social History  . Marital Status: Married    Spouse Name: N/A    Number of Children: 1  . Years of Education: N/A   Occupational History  . 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 Smoking status: Former Smoker    Quit date: 04/09/1969  . Smokeless tobacco: Former Neurosurgeon    Types: Chew    Quit date: 03/09/2010  . Alcohol Use: 0.5 oz/week    1 drink(s) per week     occasional  . Drug Use: No  . Sexually Active: Yes    Social History Narrative  .  patient's mother is currently undergoing treatment for metastatic terminal lung cancer     History  Smoking status  . Former Smoker  . Quit date: 04/09/1969  Smokeless tobacco  .  Former Neurosurgeon  . Types: Chew  . Quit date: 03/09/2010    History  Alcohol Use  . 0.5 oz/week  . 1 drink(s) per week    occasional     Allergies  Allergen Reactions  . Vesicare (Solifenacin Succinate) Itching and Rash  . Statins Other (See Comments)    myalgias  . Phenergan     Agitation and incoherence    Current Outpatient Prescriptions  Medication Sig Dispense Refill  . aspirin 325 MG tablet Take 325 mg by mouth daily.        . Cholecalciferol (VITAMIN D) 1000 UNITS capsule Take 1,000 Units by mouth daily.        . Coenzyme Q10 (COQ-10) 200 MG CAPS Take 1 capsule by mouth daily.        . colesevelam (WELCHOL) 625 MG tablet Take 1,875 mg by mouth 2 (two) times daily with a meal.        . ezetimibe (ZETIA) 10 MG tablet Take 5 mg by mouth daily.        . Glucosamine-Chondroit-Vit C-Mn (GLUCOSAMINE CHONDR 1500 COMPLX) CAPS Take 2 capsules by mouth daily.        Marland Kitchen lisinopril (PRINIVIL,ZESTRIL) 40 MG  tablet Take 20 mg by mouth daily.        . Omega-3 Fatty Acids (FISH OIL) 1000 MG CAPS Take 6 capsules by mouth daily.        Marland Kitchen omeprazole (PRILOSEC) 20 MG capsule Take 20 mg by mouth daily.             Review of Systems:     Cardiac Review of Systems: Y or N  Chest Pain [  n  ]  Resting SOB [ n  ] Exertional SOB  [ n ]  Orthopnea [n  ]   Pedal Edema [  n ]    Palpitations [ n ] Syncope  [ n ]   Presyncope [ n  ]  General Review of Systems: [Y] = yes [  ]=no Constitional: recent weight change [  ]; anorexia [  ]; fatigue [  ]; nausea [  ]; night sweats [  ]; fever [  ]; or chills [  ];                                                                                                                                          Dental: poor dentition[ y caps]; Last Dentist visit: last 6 months  Eye : blurred vision [  ]; diplopia [   ]; vision changes [  ];  Amaurosis fugax[  ]; Resp: cough [  ];  wheezing[  ];  hemoptysis[  ]; shortness of breath[  ]; paroxysmal nocturnal dyspnea[  ]; dyspnea on exertion[  ]; or orthopnea[  ];  GI:  gallstones[  ], vomiting[  ];  dysphagia[  ]; melena[  ];  hematochezia [  ]; heartburn[  ];   Hx of  Colonoscopy[y  ]; GU: kidney stones [  ]; hematuria[  ];   dysuria [ y ];  nocturia[  y];  history of     obstruction [ n ];             Skin: rash, swelling[  ];, hair loss[  ];  peripheral edema[  ];  or itching[  ]; Musculosketetal: myalgias[  ];  joint swelling[  ];  joint erythema[  ];  joint pain[  ];  back pain[  ];  Heme/Lymph: bruising[  ];  bleeding[  ];  anemia[  ];  Neuro: TIA[  ];  headaches[  ];  stroke[  ];  vertigo[  ];  seizures[  ];   paresthesias[  ];  difficulty walking[  ];  Psych:depression[  ]; anxiety[  ];  Endocrine: diabetes[ n ];  thyroid dysfunction[ n ];  Immunizations: Flu [ y ]; Pneumococcal[y  ]; and shingles  Other:  Physical Exam: BP 129/79  Pulse 77  Resp 16  Ht 6\' 7"  (2.007 m)  Wt 285 lb (129.275 kg)  BMI 32.11 kg/m2  SpO2  94%  General appearance: alert, cooperative, appears stated age and no distress Neurologic: intact Heart: regular rate and rhythm, S1, S2 normal, no murmur, click, rub or gallop Lungs: clear to auscultation bilaterally Abdomen: soft, non-tender; bowel sounds normal; no masses,  no organomegaly Extremities: extremities normal, atraumatic, no cyanosis or edema, Homans sign is negative, no sign of DVT and varicose veins noted no adenopathy cervical or axillary Patient has large body stature with a body surface area of 2.6 however he has no features of Marfan's,  Diagnostic Studies & Laboratory data:     Recent Radiology Findings:  Ct Angio Chest W/cm &/or Wo Cm  05/14/2011  *RADIOLOGY REPORT*  Clinical Data: Ascending aortic aneurysm.  CT ANGIOGRAPHY CHEST  Technique:  Multidetector CT imaging of the chest using the standard protocol during bolus administration of intravenous contrast. Multiplanar reconstructed images including MIPs were obtained and reviewed to evaluate the vascular anatomy.  Contrast: OMNIPAQUE IOHEXOL 300 MG/ML IV SOLN  Comparison: Chest c t a to 06/2010.  Findings:  Mediastinum: When compared to the prior examination, the size of the ascending thoracic aortic aneurysm has slightly increased, currently measuring approximately 5 cm in diameter (measured on images 69 and 74 of series 5).  The mid arch measures 3.4 cm (previously approximately 3.0 cm).  Descending thoracic aorta measures approximately 2.8 cm at the level of the left main pulmonary artery (unchanged).  Mild focal dilatation of the isthmus (3.9 cm) is unchanged as well.  No evidence of aortic dissection at this time.  Sinuses of Valsalva also appeared dilated (measuring approximately 5.2 cm on coronal images); please note that the accuracy of this measurement is exceedingly limited on a non gated examination). There is atherosclerosis of the thoracic aorta, the great vessels of the mediastinum and the coronary  arteries, including calcified atherosclerotic plaque in the left anterior descending, left circumflex and right coronary arteries. Heart size is mildly enlarged. There is no significant pericardial fluid, thickening or pericardial calcification. No pathologically enlarged mediastinal or hilar lymph nodes. Esophagus is unremarkable in appearance.   .  Lungs/Pleura: As with prior examination as there are numerous tiny sub 4 mm pulmonary nodules scattered throughout the lungs bilaterally that are unchanged in size, number and distribution compared to a remote prior studies 11/05/2005, and at this time  these can be considered radiographically benign. There are otherwise no new or enlarging suspicious appearing pulmonary nodules or masses identified on today's CT examination.  No consolidative airspace disease.  No pleural effusions.  Upper Abdomen: Visualized portions of the upper abdomen are unremarkable.  Musculoskeletal: There are no aggressive appearing lytic or blastic lesions noted in the visualized portions of the skeleton. Orthopedic fixation hardware in the lower cervical spine incompletely imaged.  IMPRESSION:  1.  Increase in size of the ascending thoracic aortic aneurysm, which currently measures 5 cm in diameter (and has grown 6 mm in the prior year). There is also dilatation of the aortic root, as above (please note that accurate measurement of the root is not possible on this non gated CT examination). 2.  Atherosclerosis, including three-vessel coronary artery disease. Please note that although the presence of coronary artery calcium documents the presence of coronary artery disease, the severity of this disease and any potential stenosis cannot be assessed on this non-gated CT examination.  Assessment for potential risk factor modification, dietary therapy or pharmacologic therapy may be warranted, if clinically indicated. 3.  Unchanged tiny (4 mm and less) pulmonary nodules scattered throughout the  lungs bilaterally, which could be considered radiographically benign and is these are stable compared to prior study from 2007.  No imaging follow-up for these nodules is needed in the future.  Original Report Authenticated By: Florencia Reasons, M.D.     Recent Lab Findings: Lab Results  Component Value Date   WBC 5.2 03/10/2010   HGB 12.2* 03/14/2010   HCT 36.0* 03/14/2010   PLT 224 03/10/2010   GLUCOSE 115* 11/27/2010   CHOL 184 11/14/2010   TRIG 143.0 11/14/2010   HDL 48.60 11/14/2010   LDLCALC 107* 11/14/2010   ALT 23 11/14/2010   AST 24 11/14/2010   NA 142 11/27/2010   K 4.7 11/27/2010   CL 108 11/27/2010   CREATININE 0.90 05/11/2011   BUN 16 05/11/2011   CO2 27 11/27/2010   INR 1.01 03/10/2010      Assessment / Plan:     Known dilated ascending aorta Since at least 2004, most concerning now is that over the past year the aorta is current half centimeter in one year time documented by CT with no change in the previous 7 years. Because of this speed of dilatation discussed with the patient the risks and options of replacement of a descending aorta to prevent catastrophic events of aortic dissection. The risks and options of her discussed with the patient in detail. The risks and lethal nature of acute aortic dissection was explained to him. The option of waiting with followup CT scan was also discussed. Although the aneurysm has not reached 5.5 cm the accelerated  rate of change is a concern. We will contact the [place] office to proceed with preop cardiac cath. Following outpatient cardiac catheterization I will see the patient in the office to review the findings and arrange for operative intervention. The patient and his wife have had their questions answered and are willing to proceed.      Delight Ovens MD  Beeper [phone] Office [phone] 06/06/2011 4:48 PM

## 2011-06-06 NOTE — Assessment & Plan Note (Signed)
Patient has known CAD with prior stenting of the RCA. 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 He is currently being evaluated for probably surgery for his thoracic aneurysm. Will need repeat cath. Brett Canales is anxious to proceed.  I have scheduled with Dr. Clifton Romone for this Friday. Procedure has been reviewed in full detail and he is willing to proceed. He will need left heart cath with aortic root.

## 2011-06-06 NOTE — Patient Instructions (Signed)
Thoracic Aortic Aneurysm An aneurysm is the enlargement (dilatation), bulging or ballooning out of part of the wall of a vein or artery. An Aortic Aneurysm is a bulging in the largest artery of the body. This artery supplies blood from the heart to the rest of the body. The first part of the aorta is called the thoracic aorta. It leaves the heart, ascends (rises), arches, and descends (goes down) through the chest until it reaches the diaphragm (the muscular partition between the chest and abdomen (belly). The second part of the aorta is then called the abdominal aorta after it has passed the diaphragm and continues down through the abdomen. The abdominal aorta ends where it splits to form the two iliac arteries that go to the legs. Aortic aneurysms can develop anywhere along the length of the aorta. A thoracic aortic aneurysm (TAA) occurs in the first part of the aorta, between the heart and the diaphragm. The major importance of an aneurysm is that it can rupture or tear (dissect), causing death unless diagnosed and treated promptly. CAUSES   Most thoracic aortic aneurysms are related to arteriosclerosis. The arteriosclerosis can weaken the aortic wall. The pressure of the blood being pumped through the aorta causes it to balloon out at the site of weakness. Therefore, elevated blood pressure (hypertension) is associated with aneurysm. Other risk factors include:  Age over 52.     Tobacco use.     Male sex.     Family history of aneurysm.  Additional causes of thoracic aortic aneurysms include:  Genetics (passed by birth).     Injury: After physical trauma to the aorta.     Inflammation of blood vessels.     Hardening of the arteries.     Infection.  SYMPTOMS  Many aneurysms do not cause problems. A small, unchanging or slowly changing aneurysm may produce no symptoms until it suddenly ruptures or dissects (separation of the layers of the aortic wall) without warning. It may then cause  death. The symptoms (problems) of a developing aneurysm will partly depend on its size and rate of growth. Thoracic aortic aneurysms may cause pain in the:  Chest.     Back.    Sides.    Abdomen.  The pain most often has a deep quality as if it is boring into the person. It may cause:  Heart failure.     Heart attack.     Hoarseness.    Cough.    Shortness of breath.     Swallowing problems.  DIAGNOSIS   A thoracic aortic aneurysm may be suspected based on your symptoms. It may also be detected by x-ray or CT studies done for unrelated reasons.   Several different imaging studies can be used to confirm a TAA:  An echocardiogram is an ultrasound test to examine the heart. It can also examine the first parts of the aorta. Sometimes, this test is done by putting you to sleep and inserting a flexible telescope through your mouth into your esophagus, which is next to the aorta; excellent pictures of the aorta can be obtained. This is called a transesophageal echocardiogram (TEE).     CT scanning of the chest is accurate at showing the exact size and shape of the aneurysm.     MRI scanning is accurate, and is used for certain types of TAA.     An aortic angiogram shows the source of the major blood vessels arising from the aorta. It reveals the size and extent  of any aneurysm. It can also show a clot clinging to the wall of the aneurysm (mural thrombus). The angiogram may give information about a tear of the aorta.  TREATMENT   Treatment for a thoracic aortic aneurysm depends on:  Location.     Size.    Other factors.     Rate of growth.     Underlying cause.  Medical treatment is used for smaller or complicated aneurysms, or those that do not cause symptoms. These include:  Stopping smoking.     Blood pressure control.     Control of cholesterol.  Surgical treatment is used for aneurysms that cause symptoms, or for those that are large or growing in size. The surgical  technique depends on the location of the aneurysm. HOME CARE INSTRUCTIONS    If you smoke, stop. If you do not, do not start.     Take all medications as prescribed.     Your caregiver will tell you when to have your aneurysm rechecked, either by echocardiogram or CT scan. Be sure to keep this and all follow-up appointments.  SEEK MEDICAL CARE IF:    You develop mild pain in your chest, upper back, sides, or abdomen.     You develop cough, hoarseness or trouble swallowing.  SEEK IMMEDIATE MEDICAL CARE IF:    You develop severe chest or abdominal pain, or severe pain moving (radiating) to your back.     You suddenly develop cold or blue toes or feet.     You suddenly develop lightheadedness or fainting spells.     You develop trouble breathing.  Document Released: 03/26/2005 Document Revised: 12/06/2010 Document Reviewed: 02/12/2007 [facility] Patient Information 2012 [place], [place].

## 2011-06-07 LAB — BASIC METABOLIC PANEL
BUN: 23 mg/dL (ref 6–23)
CO2: 30 mEq/L (ref 19–32)
Calcium: 9.1 mg/dL (ref 8.4–10.5)
Chloride: 105 mEq/L (ref 96–112)
Creatinine, Ser: 1.1 mg/dL (ref 0.4–1.5)
GFR: 69.91 mL/min (ref >60.00)
Glucose, Bld: 83 mg/dL (ref 70–99)
Potassium: 3.8 mEq/L (ref 3.5–5.1)
Sodium: 141 mEq/L (ref 135–145)

## 2011-06-07 LAB — PROTIME-INR
INR: 1 ratio (ref 0.8–1.0)
Prothrombin Time: 10.6 s (ref 10.2–12.4)

## 2011-06-07 LAB — APTT: aPTT: 23.5 s (ref 21.7–28.8)

## 2011-06-07 LAB — CBC WITH DIFFERENTIAL/PLATELET
Basophils Absolute: 0 10*3/uL (ref 0.0–0.1)
Basophils Relative: 0.2 % (ref 0.0–3.0)
Eosinophils Absolute: 0 10*3/uL (ref 0.0–0.7)
Eosinophils Relative: 0.3 % (ref 0.0–5.0)
HCT: 43.6 % (ref 39.0–52.0)
Hemoglobin: 14.8 g/dL (ref 13.0–17.0)
Lymphocytes Relative: 23.8 % (ref 12.0–46.0)
Lymphs Abs: 2.6 10*3/uL (ref 0.7–4.0)
MCHC: 34.1 g/dL (ref 30.0–36.0)
MCV: 93.7 fl (ref 78.0–100.0)
Monocytes Absolute: 1 10*3/uL (ref 0.1–1.0)
Monocytes Relative: 8.8 % (ref 3.0–12.0)
Neutro Abs: 7.4 10*3/uL (ref 1.4–7.7)
Neutrophils Relative %: 66.9 % (ref 43.0–77.0)
Platelets: 267 10*3/uL (ref 150.0–400.0)
RBC: 4.65 Mil/uL (ref 4.22–5.81)
RDW: 13.7 % (ref 11.5–14.6)
WBC: 11.1 10*3/uL — ABNORMAL HIGH (ref 4.5–10.5)

## 2011-06-08 ENCOUNTER — Inpatient Hospital Stay (HOSPITAL_BASED_OUTPATIENT_CLINIC_OR_DEPARTMENT_OTHER)
Admission: RE | Admit: 2011-06-08 | Discharge: 2011-06-08 | Disposition: A | Discharge status: Home / Self Care | Payer: Medicare Other | Source: Ambulatory Visit | Attending: Cardiovascular Disease | Admitting: Cardiovascular Disease

## 2011-06-08 ENCOUNTER — Telehealth: Payer: Self-pay | Admitting: Cardiothoracic Surgery

## 2011-06-08 ENCOUNTER — Encounter (HOSPITAL_BASED_OUTPATIENT_CLINIC_OR_DEPARTMENT_OTHER)
Admission: RE | Disposition: A | Discharge status: Home / Self Care | Payer: Self-pay | Source: Ambulatory Visit | Attending: Cardiovascular Disease

## 2011-06-08 DIAGNOSIS — I712 Thoracic aortic aneurysm, without rupture, unspecified: Secondary | ICD-10-CM | POA: Insufficient documentation

## 2011-06-08 DIAGNOSIS — I2510 Atherosclerotic heart disease of native coronary artery without angina pectoris: Secondary | ICD-10-CM

## 2011-06-08 DIAGNOSIS — I359 Nonrheumatic aortic valve disorder, unspecified: Secondary | ICD-10-CM

## 2011-06-08 DIAGNOSIS — Z01818 Encounter for other preprocedural examination: Secondary | ICD-10-CM

## 2011-06-08 SURGERY — JV LEFT HEART CATHETERIZATION WITH CORONARY ANGIOGRAM
Anesthesia: Moderate Sedation

## 2011-06-08 MED ORDER — ASPIRIN 81 MG PO CHEW
324.0000 mg | CHEWABLE_TABLET | ORAL | Status: AC
  Administered 2011-06-08: 324 mg via ORAL

## 2011-06-08 MED ORDER — DIAZEPAM 5 MG PO TABS: 10.0000 mg | ORAL_TABLET | ORAL | Status: DC

## 2011-06-08 MED ORDER — SODIUM CHLORIDE 0.9 % IV SOLN: 250.0000 mL | INTRAVENOUS | Status: DC | PRN

## 2011-06-08 MED ORDER — SODIUM CHLORIDE 0.9 % IV SOLN
INTRAVENOUS | Status: DC
  Administered 2011-06-08: 11:00:00 via INTRAVENOUS

## 2011-06-08 MED ORDER — ONDANSETRON HCL 4 MG/2ML IJ SOLN: 4.0000 mg | Freq: Four times a day (QID) | INTRAMUSCULAR | Status: DC | PRN

## 2011-06-08 MED ORDER — SODIUM CHLORIDE 0.9 % IJ SOLN: 3.0000 mL | Freq: Two times a day (BID) | INTRAMUSCULAR | Status: DC

## 2011-06-08 MED ORDER — SODIUM CHLORIDE 0.9 % IJ SOLN: 3.0000 mL | INTRAMUSCULAR | Status: DC | PRN

## 2011-06-08 MED ORDER — ACETAMINOPHEN 325 MG PO TABS: 650.0000 mg | ORAL_TABLET | ORAL | Status: DC | PRN

## 2011-06-08 MED ORDER — DIAZEPAM 5 MG PO TABS
5.0000 mg | ORAL_TABLET | ORAL | Status: AC
  Administered 2011-06-08: 5 mg via ORAL

## 2011-06-08 MED ORDER — SODIUM CHLORIDE 0.9 % IV SOLN: INTRAVENOUS | Status: AC

## 2011-06-08 NOTE — H&P (View-Only) (Signed)
Ronald [name] Date of Birth: 1940/12/23 Medical Record #[id number]  History of Present Illness: Ronald [name] is seen today as a work in visit. He is seen for Dr. Clifton [name]. He was here earlier this week to establish his cardiology care. He is a former patient of Dr. Ronnald [name]. He has been doing well from a cardiac standpoint with no symptoms. He has had recent CT scan to follow up on his thoracic aneurysm. This has showed a change over the last year. He was referred to Dr. [name] and saw him earlier this afternoon. Surgery has been advised. He will need to be recathed. He presents here to schedule this procedure. He is currently having no symptoms. No chest pain or shortness of breath. Blood pressure has been good at home.   Current Outpatient Prescriptions on File Prior to Visit  Medication Sig Dispense Refill  . aspirin 325 MG tablet Take 325 mg by mouth daily.        . Cholecalciferol (VITAMIN D) 1000 UNITS capsule Take 1,000 Units by mouth daily.        . Coenzyme Q10 (COQ-10) 200 MG CAPS Take 1 capsule by mouth daily.        . colesevelam (WELCHOL) 625 MG tablet Take 1,875 mg by mouth 2 (two) times daily with a meal.        . ezetimibe (ZETIA) 10 MG tablet Take 5 mg by mouth daily.        . Glucosamine-Chondroit-Vit C-Mn (GLUCOSAMINE CHONDR 1500 COMPLX) CAPS Take 2 capsules by mouth daily.        Marland Kitchen lisinopril (PRINIVIL,ZESTRIL) 40 MG tablet Take 20 mg by mouth daily.        . Omega-3 Fatty Acids (FISH OIL) 1000 MG CAPS Take 6 capsules by mouth daily.        Marland Kitchen omeprazole (PRILOSEC) 20 MG capsule Take 20 mg by mouth daily.          Allergies  Allergen Reactions  . Vesicare (Solifenacin Succinate) Itching and Rash  . Statins Other (See Comments)    myalgias  . Phenergan     Agitation and incoherence    Past Medical History  Diagnosis Date  . Hypertension   . Hypercholesteremia     managed by dr Ronald [name]  . 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   . Thoracic aortic aneurysm     last scan in Jan 2013. Now measuring 5.0cm. Referred to Dr. [name]  . Malaria   . SVT (supraventricular tachycardia)     remote episode during exercise test    Past Surgical History  Procedure Date  . Cervical fusion 03-2010    c2-c7  . Rotator cuff surgery 1997  . Elbow surgery 1997  . Retinal detachment surgery 1975  . Coronary stent placement 2004    History  Smoking status  . Former Smoker  . Quit date: 04/09/1969  Smokeless tobacco  . Former Neurosurgeon  . Types: Chew  . Quit date: 03/09/2010    History  Alcohol Use  . 0.5 oz/week  . 1 drink(s) per week    occasional    Family History  Problem Relation Age of Onset  . Heart disease Father     Review of Systems: The review of systems is per the HPI.  All other systems were reviewed and are negative.  Physical Exam: BP  152/96  Pulse 66  Resp 20 Patient is very pleasant and in no acute distress. He is tall. Skin is warm and dry. Color is normal.  HEENT is unremarkable. Normocephalic/atraumatic. PERRL. Sclera are nonicteric. Neck is supple. No masses. No JVD. Lungs are clear. Cardiac exam shows a regular rate and rhythm. Abdomen is soft. Extremities are without edema. Gait and ROM are intact. No gross neurologic deficits noted.  LABORATORY DATA: Pending  Ct Angio Chest W/cm &/or Wo Cm  05/14/2011  *RADIOLOGY REPORT*  Clinical Data: Ascending aortic aneurysm.  CT ANGIOGRAPHY CHEST  Technique:  Multidetector CT imaging of the chest using the standard protocol during bolus administration of intravenous contrast. Multiplanar reconstructed images including MIPs were obtained and reviewed to evaluate the vascular anatomy.  Contrast: OMNIPAQUE IOHEXOL 300 MG/ML IV SOLN  Comparison: Chest c t a to 06/2010.  Findings:  Mediastinum: When compared to the prior examination, the size of the ascending thoracic aortic aneurysm has  slightly increased, currently measuring approximately 5 cm in diameter (measured on images 69 and 74 of series 5).  The mid arch measures 3.4 cm (previously approximately 3.0 cm).  Descending thoracic aorta measures approximately 2.8 cm at the level of the left main pulmonary artery (unchanged).  Mild focal dilatation of the isthmus (3.9 cm) is unchanged as well.  No evidence of aortic dissection at this time.  Sinuses of Valsalva also appeared dilated (measuring approximately 5.2 cm on coronal images); please note that the accuracy of this measurement is exceedingly limited on a non gated examination). There is atherosclerosis of the thoracic aorta, the great vessels of the mediastinum and the coronary arteries, including calcified atherosclerotic plaque in the left anterior descending, left circumflex and right coronary arteries. Heart size is mildly enlarged. There is no significant pericardial fluid, thickening or pericardial calcification. No pathologically enlarged mediastinal or hilar lymph nodes. Esophagus is unremarkable in appearance.   .  Lungs/Pleura: As with prior examination as there are numerous tiny sub 4 mm pulmonary nodules scattered throughout the lungs bilaterally that are unchanged in size, number and distribution compared to a remote prior studies 11/05/2005, and at this time these can be considered radiographically benign. There are otherwise no new or enlarging suspicious appearing pulmonary nodules or masses identified on today's CT examination.  No consolidative airspace disease.  No pleural effusions.  Upper Abdomen: Visualized portions of the upper abdomen are unremarkable.  Musculoskeletal: There are no aggressive appearing lytic or blastic lesions noted in the visualized portions of the skeleton. Orthopedic fixation hardware in the lower cervical spine incompletely imaged.  IMPRESSION:  1.  Increase in size of the ascending thoracic aortic aneurysm, which currently measures 5 cm in  diameter (and has grown 6 mm in the prior year). There is also dilatation of the aortic root, as above (please note that accurate measurement of the root is not possible on this non gated CT examination). 2.  Atherosclerosis, including three-vessel coronary artery disease. Please note that although the presence of coronary artery calcium documents the presence of coronary artery disease, the severity of this disease and any potential stenosis cannot be assessed on this non-gated CT examination.  Assessment for potential risk factor modification, dietary therapy or pharmacologic therapy may be warranted, if clinically indicated. 3.  Unchanged tiny (4 mm and less) pulmonary nodules scattered throughout the lungs bilaterally, which could be considered radiographically benign and is these are stable compared to prior study from 2007.  No imaging follow-up for these nodules  is needed in the future.  Original Report Authenticated By: Florencia Reasons, M.D.   Assessment / Plan:

## 2011-06-08 NOTE — Interval H&P Note (Signed)
History and Physical Interval Note:  06/08/2011 11:24 AM  Ronald [name]  has presented today for cardiac cath with the diagnosis of enlarged aortic root.  The various methods of treatment have been discussed with the patient and family. After consideration of risks, benefits and other options for treatment, the patient has consented to  Procedure(s) (LRB): JV LEFT HEART CATHETERIZATION WITH CORONARY ANGIOGRAM (N/A) as a surgical intervention .  The patients' history has been reviewed, patient examined, no change in status, stable for surgery.  I have reviewed the patients' chart and labs.  Questions were answered to the patient's satisfaction.     [name]

## 2011-06-08 NOTE — Progress Notes (Signed)
Discharge instructions completed, ambulated in hall without bleeding from right groin site, discharged to home via wheelchair with wife. 

## 2011-06-08 NOTE — CV Procedure (Signed)
   Cardiac Catheterization Operative Report  Ronald [name] [id number] 3/1/201312:05 PM Ezequiel Kayser, MD, MD  Procedure Performed:  1. Left Heart Catheterization 2. Selective Coronary Angiography 3. Left ventricular angiogram 4. Aortic root angiogram  Operator: Verne Carrow, MD  Indication:  Pt with known CAD and thoracic aortic aneurysm. The aortic aneurysm has enlarged over the last year and we recently referred [name] to see Dr. Tyrone Sage. Plans are being made for aortic root replacement.   Cardiac cath today to exclude progression of CAD.                             Procedure Details: The risks, benefits, complications, treatment options, and expected outcomes were discussed with the patient. The patient and/or family concurred with the proposed plan, giving informed consent. The patient was brought to the cath lab after IV hydration was begun and oral premedication was given. The patient was further sedated with Versed and Fentanyl. The right groin was prepped and draped in the usual manner. Using the modified Seldinger access technique, a 4 French sheath was placed in the right femoral artery. A JL-5 catheter was used to engage the LAD with sub-selective shots of the Circumflex. An AL-1 catheter was used to non-selectively inject the Circumflex artery. A 3DRC catheter was used to engage and inject the RCA.  A pigtail catheter was used to perform a left ventricular angiogram. The catheter was pulled back across the aortic valve. An aortic root angiogram was performed.   There were no immediate complications. The patient was taken to the recovery area in stable condition.   Hemodynamic Findings: Central aortic pressure: 130/72 Left ventricular pressure: 130/17/24  Angiographic Findings:  Left main:  There are separate ostia for the LAD and Circumflex.  Left Anterior Descending Artery: Large vessel that courses to the apex. No obstructive disease noted.Small diagonal  branch.   Circumflex Artery: Large caliber vessel with no obstructive disease noted.   Right Coronary Artery: Very large, dominant vessel with patent proximal stent. There is mild plaque disease in the mid and distal RCA. The small caliber PDA has 60% stenosis.   Left Ventricular Angiogram: Dilated LV. LVEF=50%.   Aortic root: Dilated aortic root and ascending aorta.   Impression: 1. Single vessel CAD with patent stent RCA 2. Preserved LV systolic function 3. 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   Recommendations: Pt will follow up with Dr. Tyrone Sage for further discussions regarding aortic root replacement.        Complications:  None. The patient tolerated the procedure well.

## 2011-06-08 NOTE — Telephone Encounter (Signed)
Pt's wife Darel Hong called to schedule f/u appt with Dr. Tyrone Sage to further discuss surgery s/p Cath today.  Advised her we will check with Dr. Tyrone Sage & call them with an appt.

## 2011-06-11 ENCOUNTER — Ambulatory Visit: Payer: Medicare Other | Admitting: Cardiothoracic Surgery

## 2011-06-12 ENCOUNTER — Ambulatory Visit: Payer: Medicare Other | Admitting: Physician Assistant

## 2011-06-14 ENCOUNTER — Ambulatory Visit (INDEPENDENT_AMBULATORY_CARE_PROVIDER_SITE_OTHER): Payer: Medicare Other | Admitting: Cardiothoracic Surgery

## 2011-06-14 ENCOUNTER — Encounter: Payer: Self-pay | Admitting: Cardiothoracic Surgery

## 2011-06-14 VITALS — BP 122/80 | HR 64 | Resp 20 | Ht 79.0 in | Wt 286.0 lb

## 2011-06-14 DIAGNOSIS — I712 Thoracic aortic aneurysm, without rupture, unspecified: Secondary | ICD-10-CM

## 2011-06-14 DIAGNOSIS — I2510 Atherosclerotic heart disease of native coronary artery without angina pectoris: Secondary | ICD-10-CM

## 2011-06-14 NOTE — Progress Notes (Signed)
[address]            [place] [address]          [phone]        Ronald [name] [facility] Medical Record #[id number] Date of Birth: [birth year]  Referring: Verne Carrow, * Primary Care: Ezequiel Kayser, MD, MD  Chief Complaint:    Chief Complaint  Patient presents with  . Follow-up    Further discuss surgery, cardiac cath 06/08/11     History of Present Illness:    Patient is a 71 year old male with known coronary occlusive disease having a stents placed in his right coronary artery 7to 8 years ago. He is also known to have a dilated ascending aorta this is been followed since 2004 with serial CT scans. The most recent scan 2013 that showed his descending aorta to dilate to 5 cm. Scans dating back to 2004 had been very consistent without change at 4.5 cm. Because of the change over the past year and previously the size of been stable for 8 years precipitated evaluation by cardiac surgery. The patient has no history of aortic insufficiency has no signs or symptoms of congestive heart failure he has no angina.  There is no family history of aortic aneurysm or aortic dissections. His father died at age 69 in 8 sudden death while at a picnic attributed to "cardiac spasm". The patient returns today after a cardiac catheterization was performed last week.   Current Activity/ Functional Status: Patient is independent with mobility/ambulation, transfers, ADL's, IADL's.   Past Medical History  Diagnosis Date  . Hypertension   . Hypercholesteremia     managed by dr Waynard Edwards  . 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   . Thoracic aortic aneurysm     last scan in Jan 2013. Now measuring 5.0cm. Referred to Dr. Tyrone Sage  . Malaria  patient had 3 episodes of malaria while serving in [place]   . SVT (supraventricular tachycardia)     remote episode during exercise test  . Lung nodules     noted on past CT scans    Past Surgical History  Procedure Date  . Cervical fusion 03-2010    c2-c7  . Rotator cuff surgery 1997  . Elbow surgery 1997  . Retinal detachment surgery 1975  . Coronary stent placement 2004    Family History  Problem Relation Age of Onset  . Heart disease Father     History   Social History  . Marital Status: Married    Spouse Name: N/A    Number of Children: 1  . Years of Education: N/A   Occupational History  . 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 Smoking status: Former Smoker    Quit date: 04/09/1969  . Smokeless tobacco: Former Neurosurgeon    Types: Chew    Quit date: 03/09/2010  . Alcohol Use: 0.5 oz/week    1 drink(s) per week     occasional  . Drug Use: No  . Sexually Active: Yes    Social History Narrative  .  patient's  mother is currently undergoing treatment for metastatic terminal lung cancer     History  Smoking status  . Former Smoker  . Quit date: 04/09/1969  Smokeless tobacco  . Former Neurosurgeon  . Types: Chew  . Quit date: 03/09/2010    History  Alcohol Use  . 0.5 oz/week  . 1 drink(s) per week    occasional     Allergies  Allergen Reactions  . Vesicare (Solifenacin Succinate) Itching and Rash  . Statins Other (See Comments)    myalgias  . Phenergan     Agitation and incoherence    Current Outpatient Prescriptions  Medication Sig Dispense Refill  . aspirin 325 MG tablet Take 325 mg by mouth daily.        . Cholecalciferol (VITAMIN D) 1000 UNITS capsule Take 1,000 Units by mouth daily.        . Coenzyme Q10 (COQ-10) 200 MG CAPS Take 1 capsule by mouth daily.        . colesevelam (WELCHOL) 625 MG tablet Take 1,875 mg by mouth 2 (two) times daily with a meal.        . ezetimibe (ZETIA) 10 MG tablet Take 5 mg by mouth daily.        . Glucosamine-Chondroit-Vit C-Mn (GLUCOSAMINE CHONDR  1500 COMPLX) CAPS Take 2 capsules by mouth daily.        Marland Kitchen lisinopril (PRINIVIL,ZESTRIL) 40 MG tablet Take 20 mg by mouth daily.        . Omega-3 Fatty Acids (FISH OIL) 1000 MG CAPS Take 6 capsules by mouth daily.        Marland Kitchen omeprazole (PRILOSEC) 20 MG capsule Take 20 mg by mouth daily.             Review of Systems:     Cardiac Review of Systems: Y or N  Chest Pain [  n  ]  Resting SOB [ n  ] Exertional SOB  [ n ]  Orthopnea [n  ]   Pedal Edema [  n ]    Palpitations [ n ] Syncope  [ n ]   Presyncope [ n  ]  General Review of Systems: [Y] = yes [  ]=no Constitional: recent weight change [  ]; anorexia [  ]; fatigue [  ]; nausea [  ]; night sweats [  ]; fever [  ]; or chills [  ];                                                                                                                                          Dental: poor dentition[ y caps]; Last Dentist visit: last 6 months  Eye : blurred vision [  ]; diplopia [   ]; vision changes [  ];  Amaurosis fugax[  ]; Resp: cough [  ];  wheezing[  ];  hemoptysis[  ]; shortness of breath[  ];  paroxysmal nocturnal dyspnea[  ]; dyspnea on exertion[  ]; or orthopnea[  ];  GI:  gallstones[  ], vomiting[  ];  dysphagia[  ]; melena[  ];  hematochezia [  ]; heartburn[  ];   Hx of  Colonoscopy[y  ]; GU: kidney stones [  ]; hematuria[  ];   dysuria [ y ];  nocturia[  y];  history of     obstruction [ n ];             Skin: rash, swelling[  ];, hair loss[  ];  peripheral edema[  ];  or itching[  ]; Musculosketetal: myalgias[  ];  joint swelling[  ];  joint erythema[  ];  joint pain[  ];  back pain[  ];  Heme/Lymph: bruising[  ];  bleeding[  ];  anemia[  ];  Neuro: TIA[  ];  headaches[  ];  stroke[  ];  vertigo[  ];  seizures[  ];   paresthesias[  ];  difficulty walking[  ];  Psych:depression[  ]; anxiety[  ];  Endocrine: diabetes[ n ];  thyroid dysfunction[ n ];  Immunizations: Flu [ y ]; Pneumococcal[y  ]; and shingles  Other:  Physical Exam: BP  122/80  Pulse 64  Resp 20  Ht 6\' 7"  (2.007 m)  Wt 286 lb (129.729 kg)  BMI 32.22 kg/m2  SpO2 94%  General appearance: alert, cooperative, appears stated age and no distress Neurologic: intact Heart: regular rate and rhythm, S1, S2 normal, no murmur, click, rub or gallop Lungs: clear to auscultation bilaterally Abdomen: soft, non-tender; bowel sounds normal; no masses,  no organomegaly Extremities: extremities normal, atraumatic, no cyanosis or edema, Homans sign is negative, no sign of DVT and varicose veins noted no adenopathy cervical or axillary Patient has large body stature with a body surface area of 2.6 however he has no features of Marfan's,  Diagnostic Studies & Laboratory data:     Recent Radiology Findings:  Ct Angio Chest W/cm &/or Wo Cm  05/14/2011  *RADIOLOGY REPORT*  Clinical Data: Ascending aortic aneurysm.  CT ANGIOGRAPHY CHEST  Technique:  Multidetector CT imaging of the chest using the standard protocol during bolus administration of intravenous contrast. Multiplanar reconstructed images including MIPs were obtained and reviewed to evaluate the vascular anatomy.  Contrast: OMNIPAQUE IOHEXOL 300 MG/ML IV SOLN  Comparison: Chest c t a to 06/2010.  Findings:  Mediastinum: When compared to the prior examination, the size of the ascending thoracic aortic aneurysm has slightly increased, currently measuring approximately 5 cm in diameter (measured on images 69 and 74 of series 5).  The mid arch measures 3.4 cm (previously approximately 3.0 cm).  Descending thoracic aorta measures approximately 2.8 cm at the level of the left main pulmonary artery (unchanged).  Mild focal dilatation of the isthmus (3.9 cm) is unchanged as well.  No evidence of aortic dissection at this time.  Sinuses of Valsalva also appeared dilated (measuring approximately 5.2 cm on coronal images); please note that the accuracy of this measurement is exceedingly limited on a non gated examination). There is  atherosclerosis of the thoracic aorta, the great vessels of the mediastinum and the coronary arteries, including calcified atherosclerotic plaque in the left anterior descending, left circumflex and right coronary arteries. Heart size is mildly enlarged. There is no significant pericardial fluid, thickening or pericardial calcification. No pathologically enlarged mediastinal or hilar lymph nodes. Esophagus is unremarkable in appearance.   .  Lungs/Pleura: As with prior examination as there are  numerous tiny sub 4 mm pulmonary nodules scattered throughout the lungs bilaterally that are unchanged in size, number and distribution compared to a remote prior studies 11/05/2005, and at this time these can be considered radiographically benign. There are otherwise no new or enlarging suspicious appearing pulmonary nodules or masses identified on today's CT examination.  No consolidative airspace disease.  No pleural effusions.  Upper Abdomen: Visualized portions of the upper abdomen are unremarkable.  Musculoskeletal: There are no aggressive appearing lytic or blastic lesions noted in the visualized portions of the skeleton. Orthopedic fixation hardware in the lower cervical spine incompletely imaged.  IMPRESSION:  1.  Increase in size of the ascending thoracic aortic aneurysm, which currently measures 5 cm in diameter (and has grown 6 mm in the prior year). There is also dilatation of the aortic root, as above (please note that accurate measurement of the root is not possible on this non gated CT examination). 2.  Atherosclerosis, including three-vessel coronary artery disease. Please note that although the presence of coronary artery calcium documents the presence of coronary artery disease, the severity of this disease and any potential stenosis cannot be assessed on this non-gated CT examination.  Assessment for potential risk factor modification, dietary therapy or pharmacologic therapy may be warranted, if clinically  indicated. 3.  Unchanged tiny (4 mm and less) pulmonary nodules scattered throughout the lungs bilaterally, which could be considered radiographically benign and is these are stable compared to prior study from 2007.  No imaging follow-up for these nodules is needed in the future.  Original Report Authenticated By: Florencia Reasons, M.D.     Recent Lab Findings: Lab Results  Component Value Date   WBC 11.1* 06/06/2011   HGB 14.8 06/06/2011   HCT 43.6 06/06/2011   PLT 267.0 06/06/2011   GLUCOSE 83 06/06/2011   CHOL 184 11/14/2010   TRIG 143.0 11/14/2010   HDL 48.60 11/14/2010   LDLCALC 107* 11/14/2010   ALT 23 11/14/2010   AST 24 11/14/2010   NA 141 06/06/2011   K 3.8 06/06/2011   CL 105 06/06/2011   CREATININE 1.1 06/06/2011   BUN 23 06/06/2011   CO2 30 06/06/2011   INR 1.0 06/06/2011   BERDELL HOSTETLER  [id number]  3/1/201312:05 PM  [name] A, MD, MD  Procedure Performed:  1. Left Heart Catheterization 2. Selective Coronary Angiography 3. Left ventricular angiogram 4. Aortic root angiogram Operator: Verne Carrow, MD  Indication: Pt with known CAD and thoracic aortic aneurysm. The aortic aneurysm has enlarged over the last year and we recently referred Mr. Hatlestad to see Dr. Tyrone Sage. Plans are being made for aortic root replacement. Cardiac cath today to exclude progression of CAD.  Procedure Details:  The risks, benefits, complications, treatment options, and expected outcomes were discussed with the patient. The patient and/or family concurred with the proposed plan, giving informed consent. The patient was brought to the cath lab after IV hydration was begun and oral premedication was given. The patient was further sedated with Versed and Fentanyl. The right groin was prepped and draped in the usual manner. Using the modified Seldinger access technique, a 4 French sheath was placed in the right femoral artery. A JL-5 catheter was used to engage the LAD with sub-selective shots of the  Circumflex. An AL-1 catheter was used to non-selectively inject the Circumflex artery. A 3DRC catheter was used to engage and inject the RCA. A pigtail catheter was used to perform a left ventricular angiogram. The catheter was pulled  back across the aortic valve. An aortic root angiogram was performed.  There were no immediate complications. The patient was taken to the recovery area in stable condition.  Hemodynamic Findings:  Central aortic pressure: 130/72  Left ventricular pressure: 130/17/24  Angiographic Findings:  Left main: There are separate ostia for the LAD and Circumflex.  Left Anterior Descending Artery: Large vessel that courses to the apex. No obstructive disease noted.Small diagonal branch.  Circumflex Artery: Large caliber vessel with no obstructive disease noted.  Right Coronary Artery: Very large, dominant vessel with patent proximal stent. There is mild plaque disease in the mid and distal RCA. The small caliber PDA has 60% stenosis.  Left Ventricular Angiogram: Dilated LV. LVEF=50%.  Aortic root: Dilated aortic root and ascending aorta.  Impression:  1. 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 Preserved LV systolic function  3. 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       Assessment / Plan:     Known dilated ascending aorta Since at least 2004, most concerning now is that over the past year the aorta is current half centimeter in one year time documented by CT with no change in the previous 7 years.  The risks and lethal nature of acute aortic dissection was explained to him. On further examination of the CAT scan and the cardiac catheterization the patient's coronary disease is stable. He has no aortic insufficiency. Close measurement of the descending aorta above the root appears that it has not changed and remains in the range of 4.5 cm. The 5 cm measurement appeared to be done in the aortic root. I discussed these findings with the patient and with the  risks and options involved both with replacement of his ascending aorta or waiting we have come to the decision, to repeat his CT scan in 6 months cardiac gated CT to obtain good measurements of the aortic root.  The patient is on blood pressure medication but is not currently on a beta blocker, cardiology will review this and make sure there is no contraindication to beta blocker. I plan to see him back in 6 months    Delight Ovens MD  Beeper [phone] Office [phone] 06/14/2011 5:42 PM

## 2011-06-15 ENCOUNTER — Encounter: Payer: Self-pay | Admitting: *Deleted

## 2011-10-26 ENCOUNTER — Encounter: Payer: Self-pay | Admitting: Cardiology

## 2011-11-21 ENCOUNTER — Other Ambulatory Visit: Payer: Self-pay | Admitting: Cardiothoracic Surgery

## 2011-11-21 DIAGNOSIS — I2510 Atherosclerotic heart disease of native coronary artery without angina pectoris: Secondary | ICD-10-CM

## 2011-11-21 DIAGNOSIS — I712 Thoracic aortic aneurysm, without rupture: Secondary | ICD-10-CM

## 2011-11-23 ENCOUNTER — Encounter: Payer: Self-pay | Admitting: *Deleted

## 2011-11-26 ENCOUNTER — Ambulatory Visit (INDEPENDENT_AMBULATORY_CARE_PROVIDER_SITE_OTHER): Payer: Medicare Other | Admitting: *Deleted

## 2011-11-26 DIAGNOSIS — I2510 Atherosclerotic heart disease of native coronary artery without angina pectoris: Secondary | ICD-10-CM

## 2011-11-26 LAB — BASIC METABOLIC PANEL
BUN: 17 mg/dL (ref 6–23)
Chloride: 110 mEq/L (ref 96–112)
Glucose, Bld: 119 mg/dL — ABNORMAL HIGH (ref 70–99)
Potassium: 4.5 mEq/L (ref 3.5–5.1)

## 2011-12-04 ENCOUNTER — Ambulatory Visit (INDEPENDENT_AMBULATORY_CARE_PROVIDER_SITE_OTHER): Payer: Medicare Other | Admitting: Cardiovascular Disease

## 2011-12-04 ENCOUNTER — Encounter: Payer: Self-pay | Admitting: Cardiovascular Disease

## 2011-12-04 VITALS — BP 125/85 | HR 61 | Ht 79.0 in | Wt 289.0 lb

## 2011-12-04 DIAGNOSIS — I2510 Atherosclerotic heart disease of native coronary artery without angina pectoris: Secondary | ICD-10-CM

## 2011-12-04 DIAGNOSIS — I712 Thoracic aortic aneurysm, without rupture: Secondary | ICD-10-CM

## 2011-12-04 NOTE — Progress Notes (Signed)
History of Present Illness: 71 yo WM with h/o CAD s/p stent RCA 2004, HTN, HLD, dilated aortic root (Ascending aortic aneurysm) here today for cardiac follow up. He has been followed in the past by Dr. Deborah Chalk. His last CTA chest was February 2012. Last stress test was March 2010 and there was no ischemia. Echo August 2012 with LVEF 55-60%. CTA chest  February 2013 with questionable enlargement of thoracic aorta. He was seen by Dr. Tyrone Sage March 2013 and he felt the aorta size was stable. Plans were made for f/u CTA chest for next month.    He tells me today that he has had no chest pain or SOB. He is not limited by any dizziness, weakness. He does not tolerate statins which caused muscle aches and cramping but has tolerated Livalo and Zetia.  He has been active. No beta blocker secondary to bradycardia.   Primary Care Physician: [name]   Last Lipid Profile: August 2012 with LDL 107, total chol 143 HDL 49   Past Medical History  Diagnosis Date  . Hypertension   . Hypercholesteremia     managed by dr Waynard Edwards  . 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   . Thoracic aortic aneurysm     last scan in Jan 2013. Now measuring 5.0cm. Referred to Dr. Tyrone Sage  . Malaria   . SVT (supraventricular tachycardia)     remote episode during exercise test  . Lung nodules     noted on past CT scans  . Hx of Bell's palsy     left face    Past Surgical History  Procedure Date  . Cervical fusion 03-2010    c2-c7  . Rotator cuff surgery 1997  . Elbow surgery 1997  . Retinal detachment surgery 1975  . Coronary stent placement 2004    Current Outpatient Prescriptions  Medication Sig Dispense Refill  . aspirin 325 MG tablet Take 325 mg by mouth daily.        . Cholecalciferol (VITAMIN D) 1000 UNITS capsule Take 1,000 Units by mouth daily.        . Coenzyme Q10 (COQ-10) 200 MG CAPS Take 1 capsule by  mouth daily.        . colesevelam (WELCHOL) 625 MG tablet Take 1,875 mg by mouth 2 (two) times daily with a meal.        . ezetimibe (ZETIA) 10 MG tablet Take 5 mg by mouth daily.        . Glucosamine-Chondroit-Vit C-Mn (GLUCOSAMINE CHONDR 1500 COMPLX) CAPS Take 2 capsules by mouth daily.        Marland Kitchen lisinopril (PRINIVIL,ZESTRIL) 40 MG tablet Take 20 mg by mouth daily.        . Omega-3 Fatty Acids (FISH OIL) 1000 MG CAPS Take 6 capsules by mouth daily.        Marland Kitchen omeprazole (PRILOSEC) 20 MG capsule Take 20 mg by mouth daily.        . Pitavastatin Calcium (LIVALO) 2 MG TABS Take by mouth. 1 tab every other day        Allergies  Allergen Reactions  . Vesicare (Solifenacin Succinate) Itching and Rash  . Statins Other (See Comments)    myalgias  . Promethazine Hcl     Agitation and incoherence    History   Social History  . Marital Status: Married  Spouse Name: N/A    Number of Children: 1  . Years of Education: N/A   Occupational History  . retired Engineer, site    Social History Main Topics  . Smoking status: Former Smoker    Quit date: 04/09/1969  . Smokeless tobacco: Former Neurosurgeon    Types: Chew    Quit date: 03/09/2010  . Alcohol Use: 0.5 oz/week    1 drink(s) per week     occasional  . Drug Use: No  . Sexually Active: Yes   Other Topics Concern  . Not on file   Social History Narrative  . No narrative on file    Family History  Problem Relation Age of Onset  . Heart disease Father     Review of Systems:  As stated in the HPI and otherwise negative.   BP 125/85  Pulse 61  Ht 6\' 7"  (2.007 m)  Wt 289 lb (131.09 kg)  BMI 32.56 kg/m2  Physical Examination: General: Well developed, well nourished, NAD HEENT: OP clear, mucus membranes moist SKIN: warm, dry. No rashes. Neuro: No focal deficits Musculoskeletal: Muscle strength 5/5 all ext Psychiatric: Mood and affect normal Neck: No JVD, no carotid bruits, no thyromegaly, no lymphadenopathy. Lungs:Clear  bilaterally, no wheezes, rhonci, crackles Cardiovascular: Regular rate and rhythm. No murmurs, gallops or rubs. Abdomen:Soft. Bowel sounds present. Non-tender.  Extremities: No lower extremity edema. Pulses are 2 + in the bilateral DP/PT.  Assessment and Plan:   1. CAD: He has no angina. Will continue ASA, statin. No ischemic workup necessary at this time. BP and lipids well controlled.   2. Thoracic Aortic aneurysm: Will plan CTA chest next month to reassess. NO beta blocker at this time secondary to bradycardia. BP is well controlled.

## 2011-12-04 NOTE — Patient Instructions (Addendum)
Your physician wants you to follow-up in:  12 months.  You will receive a reminder letter in the mail two months in advance. If you don't receive a letter, please call our office to schedule the follow-up appointment.   

## 2011-12-26 ENCOUNTER — Ambulatory Visit (HOSPITAL_COMMUNITY)
Admission: RE | Admit: 2011-12-26 | Discharge: 2011-12-26 | Disposition: A | Discharge status: Home / Self Care | Payer: Medicare Other | Source: Ambulatory Visit | Attending: Cardiothoracic Surgery | Admitting: Cardiothoracic Surgery

## 2011-12-26 DIAGNOSIS — I2510 Atherosclerotic heart disease of native coronary artery without angina pectoris: Secondary | ICD-10-CM

## 2011-12-26 DIAGNOSIS — I712 Thoracic aortic aneurysm, without rupture, unspecified: Secondary | ICD-10-CM | POA: Insufficient documentation

## 2011-12-26 MED ORDER — METOPROLOL TARTRATE 1 MG/ML IV SOLN
5.0000 mg | Freq: Once | INTRAVENOUS | Status: AC
  Administered 2011-12-26: 5 mg via INTRAVENOUS

## 2011-12-26 MED ORDER — IOHEXOL 350 MG/ML SOLN
100.0000 mL | Freq: Once | INTRAVENOUS | Status: AC | PRN
  Administered 2011-12-26: 100 mL via INTRAVENOUS

## 2011-12-26 MED ORDER — NITROGLYCERIN 0.4 MG/SPRAY TL SOLN
1.0000 | Status: DC | PRN
Start: 2011-12-26 — End: 2011-12-26

## 2011-12-26 MED ORDER — NITROGLYCERIN 0.4 MG SL SUBL
0.4000 mg | SUBLINGUAL_TABLET | SUBLINGUAL | Status: DC | PRN
  Administered 2011-12-26: 0.4 mg via SUBLINGUAL

## 2011-12-26 MED ORDER — METOPROLOL TARTRATE 1 MG/ML IV SOLN
INTRAVENOUS | Status: AC
  Administered 2011-12-26: 5 mg via INTRAVENOUS
  Filled 2011-12-26: qty 5

## 2011-12-26 MED ORDER — NITROGLYCERIN 0.4 MG SL SUBL
SUBLINGUAL_TABLET | SUBLINGUAL | Status: AC
  Administered 2011-12-26: 0.4 mg via SUBLINGUAL
  Filled 2011-12-26: qty 25

## 2012-01-03 ENCOUNTER — Encounter: Payer: Self-pay | Admitting: Cardiothoracic Surgery

## 2012-01-03 ENCOUNTER — Ambulatory Visit (INDEPENDENT_AMBULATORY_CARE_PROVIDER_SITE_OTHER): Payer: Medicare Other | Admitting: Cardiothoracic Surgery

## 2012-01-03 VITALS — BP 121/79 | HR 83 | Resp 18 | Ht 79.0 in | Wt 285.0 lb

## 2012-01-03 DIAGNOSIS — I2510 Atherosclerotic heart disease of native coronary artery without angina pectoris: Secondary | ICD-10-CM

## 2012-01-03 DIAGNOSIS — I712 Thoracic aortic aneurysm, without rupture: Secondary | ICD-10-CM

## 2012-01-03 NOTE — Progress Notes (Signed)
[address]            [place] [address]          [phone]       Ronald [name] [facility] Medical Record #[id number] Date of Birth: Jul 18, 1940  Referring: Kathleene Hazel* Primary Care: Ezequiel Kayser, MD  Chief Complaint:    Chief Complaint  Patient presents with  . Follow-up    6 month f/y for dilated ascending aorta    History of Present Illness:    Patient is a 71 year old male with known coronary occlusive disease having a stents placed in his right coronary artery 7to 8 years ago. He is also known to have a dilated ascending aorta this is been followed since 2004 with serial CT scans. The most recent scan 2013 that showed his descending aorta to dilate to 5 cm. Scans dating back to 2004 had been very consistent without change at 4.5 cm. Because of the change over the past year and previously the size of been stable for 8 years precipitated evaluation by cardiac surgery. The patient has no history of aortic insufficiency has no signs or symptoms of congestive heart failure he has no angina.  There is no family history of aortic aneurysm or aortic dissections. His father died at age 25 in 18 sudden death while at a picnic attributed to "cardiac spasm". The patient returns today after a cardiac catheterization was performed last week.   Current Activity/ Functional Status: Patient is independent with mobility/ambulation, transfers, ADL's, IADL's.   Past Medical History  Diagnosis Date  . Hypertension   . Hypercholesteremia     managed by dr Waynard Edwards  . 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   . Thoracic aortic aneurysm     last scan in Jan 2013. Now measuring 5.0cm. Referred to Dr. Tyrone Sage  . Malaria  patient had 3 episodes of malaria while serving in [place]   . SVT (supraventricular  tachycardia)     remote episode during exercise test  . Lung nodules     noted on past CT scans    Past Surgical History  Procedure Date  . Cervical fusion 03-2010    c2-c7  . Rotator cuff surgery 1997  . Elbow surgery 1997  . Retinal detachment surgery 1975  . Coronary stent placement 2004    Family History  Problem Relation Age of Onset  . Heart disease Father     History   Social History  . Marital Status: Married    Spouse Name: N/A    Number of Children: 1  . Years of Education: N/A   Occupational History  . 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 Smoking status: Former Smoker    Quit date: 04/09/1969  . Smokeless tobacco: Former Neurosurgeon    Types: Chew    Quit date: 03/09/2010  . Alcohol Use: 0.5 oz/week    1 drink(s) per week     occasional  . Drug Use: No  .  Sexually Active: Yes    Social History Narrative  .  patient's mother is currently undergoing treatment for metastatic terminal lung cancer     History  Smoking status  . Former Smoker  . Quit date: 04/09/1969  Smokeless tobacco  . Former Neurosurgeon  . Types: Chew  . Quit date: 03/09/2010    History  Alcohol Use  . 0.5 oz/week  . 1 drink(s) per week    occasional     Allergies  Allergen Reactions  . Vesicare (Solifenacin Succinate) Itching and Rash  . Statins Other (See Comments)    myalgias  . Promethazine Hcl     Agitation and incoherence    Current Outpatient Prescriptions  Medication Sig Dispense Refill  . aspirin 325 MG tablet Take 325 mg by mouth daily.        . Cholecalciferol (VITAMIN D) 1000 UNITS capsule Take 1,000 Units by mouth daily.        . Coenzyme Q10 (COQ-10) 200 MG CAPS Take 1 capsule by mouth daily.        . colesevelam (WELCHOL) 625 MG tablet Take 1,875 mg by mouth 2 (two) times daily with a meal.        . ezetimibe (ZETIA) 10 MG tablet Take 5 mg by mouth daily.        . fluticasone (FLONASE) 50  MCG/ACT nasal spray Place 1 spray into the nose daily. As needed      . Glucosamine-Chondroit-Vit C-Mn (GLUCOSAMINE CHONDR 1500 COMPLX) CAPS Take 2 capsules by mouth daily.        Marland Kitchen lisinopril (PRINIVIL,ZESTRIL) 40 MG tablet Take 20 mg by mouth daily.        . Omega-3 Fatty Acids (FISH OIL) 1000 MG CAPS Take 6 capsules by mouth daily.        Marland Kitchen omeprazole (PRILOSEC) 20 MG capsule Take 20 mg by mouth daily.        . Pitavastatin Calcium (LIVALO) 2 MG TABS Take by mouth. 1 tab every day           Review of Systems:     Cardiac Review of Systems: Y or N  Chest Pain [  n  ]  Resting SOB [ n  ] Exertional SOB  [ n ]  Orthopnea [n  ]   Pedal Edema [  mild ]    Palpitations [ n ] Syncope  [ n ]   Presyncope [ n  ]  General Review of Systems: [Y] = yes [  ]=no Constitional: recent weight change [  ]; anorexia [  ]; fatigue [  ]; nausea [  ]; night sweats [  ]; fever [  ]; or chills [  ];  Dental: poor dentition[ y caps]; Last Dentist visit: last 6 months  Eye : blurred vision [  ]; diplopia [   ]; vision changes [  ];  Amaurosis fugax[  ]; Resp: cough [  ];  wheezing[  ];  hemoptysis[  ]; shortness of breath[  ]; paroxysmal nocturnal dyspnea[  ]; dyspnea on exertion[  ]; or orthopnea[  ];  GI:  gallstones[  ], vomiting[  ];  dysphagia[  ]; melena[  ];  hematochezia [  ]; heartburn[  ];   Hx of  Colonoscopy[y  ]; GU: kidney stones [  ]; hematuria[  ];   dysuria [ y ];  nocturia[  y];  history of     obstruction [ n ];             Skin: rash, swelling[  ];, hair loss[  ];  peripheral edema[  ];  or itching[  ]; Musculosketetal: myalgias[  ];  joint swelling[  ];  joint erythema[  ];  joint pain[  ];  back pain[  ];  Heme/Lymph: bruising[  ];  bleeding[  ];  anemia[  ];  Neuro: TIA[  ];  headaches[  ];  stroke[  ];  vertigo[  ];  seizures[  ];   paresthesias[  ];   difficulty walking[  ];  Psych:depression[  ]; anxiety[  ];  Endocrine: diabetes[ n ];  thyroid dysfunction[ n ];  Immunizations: Flu [ y ]; Pneumococcal[y  ]; and shingles  Other:  Physical Exam: BP 121/79  Pulse 83  Resp 18  Ht 6\' 7"  (2.007 m)  Wt 285 lb (129.275 kg)  BMI 32.11 kg/m2  SpO2 98%  General appearance: alert, cooperative, appears stated age and no distress Neurologic: intact Heart: regular rate and rhythm, S1, S2 normal, no murmur, click, rub or gallop Lungs: clear to auscultation bilaterally Abdomen: soft, non-tender; bowel sounds normal; no masses,  no organomegaly Extremities: extremities normal, atraumatic, no cyanosis or edema, Homans sign is negative, no sign of DVT and varicose veins noted no adenopathy cervical or axillary Patient has large body stature with a body surface area of 2.6 however he has no features of Marfan's,  Diagnostic Studies & Laboratory data:     Recent Radiology Findings:  Ct Coronary Morp W/cta Cor W/score W/ca W/cm &/or Wo/cm  12/26/2011  *RADIOLOGY REPORT*  INDICATION:  Ascending thoracic aortic aneurysm and aneurysmal dilatation of the aortic root.  Follow-up study.  CT ANGIOGRAPHY OF THE HEART, CORONARY ARTERY, STRUCTURE, AND MORPHOLOGY  CONTRAST:  100 ml of Omnipaque-300.  COMPARISON:  CTA chest 05/14/2011.  TECHNIQUE:  CT angiography of the coronary vessels was performed on a 256 channel system using prospective ECG gating.  A scout and noncontrast exam (for calcium scoring) were performed.  Circulation time was measured using a test bolus.  Coronary CTA was performed with sub mm slice collimation during portions of the cardiac cycle after prior injection of iodinated contrast.  Imaging post processing was performed on an independent workstation creating multiplanar and 3-D images, and quantitative analysis of the heart and coronary arteries.  Note that this exam targets the heart and the chest was not imaged in its entirety.   PREMEDICATION: Lopressor 5 mg, IV Nitroglycerin 400 mcg, sublingual.  FINDINGS: Technical quality:  Good  Heart rate:  55-60  CORONARY ARTERIES: Left main coronary artery:  Not present (there are separate ostia for the left anterior descending and left circumflex coronary arteries directly off the left sinus of  Valsalva). Left anterior descending:  Mildly diseased with mixed calcified noncalcified atherosclerotic plaque with areas of stenosis up to 25 - 50% in the mid LAD. Left circumflex:  Large caliber vessel that quickly gives rise to two large obtuse marginal vessels.  Proximal left circumflex demonstrates only minimal calcified plaque but no associated stenosis. Obtuse Marginal 1: Mildly diseased with calcified plaque with no associated luminal stenosis. Obtuse Marginal 2: Extensive calcified atherosclerotic plaque proximally with stenosis >50% (possibly >75%), however, secondary to the high degree of calcification, accurate assessment for luminal stenosis is not possible on this examination. Right coronary artery:  There is a stent in the proximal right coronary artery which is grossly patent.  There is some calcified plaque at the distal end of the stent where the vessel is slightly acutely angulated.  There appears to be some stenosis in this region, however, it is favored to be approximately 25 - 50% (accurate assessment is limited by the high-density the stent material and the heavily calcified plaque).  The remainder of the RCA is otherwise mildly diseased with nonobstructive plaque. Posterior descending artery:  Patent with a mixed calcified noncalcified plaque.  Secondary to the small size of the vessel and excessive image noise, accurate assessment for stenosis is not possible on this examination, however, there appears to be at least 25 - 50% stenosis of the mid PDA. Dominance:  Right  CORONARY CALCIUM: Total Agatston Score:  655 MESA database percentile:  80th Comment - it appears as though the  patient has moved or breathed during image acquisition, such that the a portion of the coronary arteries was incompletely visualized.  This likely underestimates the patient's true calcium score.  AORTA AND PULMONARY MEASUREMENTS: Aortic root (21 - 40 mm):             29 mm  at the annulus             49 mm  at the sinuses of Valsalva         36 mm  at the sinotubular junction Ascending aorta ( <  40 mm):  50 mm Aortic arch : 34 mm Descending aorta:  28 mm Main pulmonary artery:  ( <  30 mm):  35 mm  EXTRACARDIAC FINDINGS: Several tiny pulmonary nodules measuring 4 mm are scattered throughout the lungs bilaterally and are completely unchanged compared to prior examinations (dating back to 2007); these can be considered radiographically benign requiring no further imaging follow-up.  Small calcified granulomas are also noted in the posterior aspect of the left upper lobe.  Multiple calcified left hilar and mediastinal lymph nodes are noted.  No acute consolidative airspace disease.  No pleural effusions.  Visualized portions of the upper abdomen are unremarkable.  IMPRESSION:  1. Ascending thoracic aortic aneurysm is similar in size measuring 4.9 cm on today's examination.  No evidence of thoracic aortic dissection at this time. 2.  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  The patient's aortic valve appears to be tricuspid.  Additionally, there is no effacement of the sinotubular junction. 3.  Multivessel coronary artery disease, as detailed above.  Most importantly, there is a heavily calcified plaque in the obtuse marginal 2 branch (a large vessel) which appears to be stenotic (i.e., at least 50% diameter stenosis, and possbily in excess of 75%), however, accurate assessment for percentage stenosis is limited on this examination secondary to the high degree of calcification. Clinical correlation is recommended,  with consideration for further  evaluation with stress testing if there is clinical concern for inducible ischemia. 4.  Coronary artery calcium scoring is at least 655 (the patient moved during the image acquisition, resulting in incomplete coverage of the coronary arteries which likely underestimates the true calcium score), which is 80th percentile for patient's of matched age, gender and race/ethnicity. 5.  Right coronary artery dominance.   Original Report Authenticated By: Florencia Reasons, M.D.     Recent Lab Findings: Lab Results  Component Value Date   WBC 11.1* 06/06/2011   HGB 14.8 06/06/2011   HCT 43.6 06/06/2011   PLT 267.0 06/06/2011   GLUCOSE 119* 11/26/2011   CHOL 184 11/14/2010   TRIG 143.0 11/14/2010   HDL 48.60 11/14/2010   LDLCALC 107* 11/14/2010   ALT 23 11/14/2010   AST 24 11/14/2010   NA 142 11/26/2011   K 4.5 11/26/2011   CL 110 11/26/2011   CREATININE 0.9 11/26/2011   BUN 17 11/26/2011   CO2 26 11/26/2011   INR 1.0 06/06/2011   AVEL CUBBISON  [id number]  3/1/201312:05 PM  [name] A, MD, MD  Procedure Performed:  1. Left Heart Catheterization 2. Selective Coronary Angiography 3. Left ventricular angiogram 4. Aortic root angiogram Operator: Verne Carrow, MD  Indication: Pt with known CAD and thoracic aortic aneurysm. The aortic aneurysm has enlarged over the last year and we recently referred Ronald [name] to see Dr. Tyrone Sage. Plans are being made for aortic root replacement. Cardiac cath today to exclude progression of CAD.  Procedure Details:  The risks, benefits, complications, treatment options, and expected outcomes were discussed with the patient. The patient and/or family concurred with the proposed plan, giving informed consent. The patient was brought to the cath lab after IV hydration was begun and oral premedication was given. The patient was further sedated with Versed and Fentanyl. The right groin was prepped and draped in the usual manner. Using the modified Seldinger access technique, a  4 French sheath was placed in the right femoral artery. A JL-5 catheter was used to engage the LAD with sub-selective shots of the Circumflex. An AL-1 catheter was used to non-selectively inject the Circumflex artery. A 3DRC catheter was used to engage and inject the RCA. A pigtail catheter was used to perform a left ventricular angiogram. The catheter was pulled back across the aortic valve. An aortic root angiogram was performed.  There were no immediate complications. The patient was taken to the recovery area in stable condition.  Hemodynamic Findings:  Central aortic pressure: 130/72  Left ventricular pressure: 130/17/24  Angiographic Findings:  Left main: There are separate ostia for the LAD and Circumflex.  Left Anterior Descending Artery: Large vessel that courses to the apex. No obstructive disease noted.Small diagonal branch.  Circumflex Artery: Large caliber vessel with no obstructive disease noted.  Right Coronary Artery: Very large, dominant vessel with patent proximal stent. There is mild plaque disease in the mid and distal RCA. The small caliber PDA has 60% stenosis.  Left Ventricular Angiogram: Dilated LV. LVEF=50%.  Aortic root: Dilated aortic root and ascending aorta.  Impression:  1. 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 Preserved LV systolic function  3. 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       Assessment / Plan:     Known dilated ascending aorta Since at least 2004, most concerning now is that over the past year the aorta is current half centimeter in one year time documented  by CT with no change in the previous 7 years.  The risks and lethal nature of acute aortic dissection was explained to him. On further examination of the CAT scan and the cardiac catheterization the patient's coronary disease is stable. He has no aortic insufficiency. the aortic root.  More detailed ct of aortic root has measurement at 4.9 cm Will plan to continue to follow,  patient not anxious to have surgery  The patient is on blood pressure medication but is not currently on a beta blocker, cardiology has  Reviewed  this and make sure there is no contraindication to beta blocker. I plan to see him back in 9 months with repeat cta.    Delight Ovens MD  Beeper [phone] Office [phone] 01/03/2012 2:15 PM

## 2012-01-03 NOTE — Patient Instructions (Signed)
Watch salt intake and monitor home BP Return in 9-10 months

## 2012-09-24 ENCOUNTER — Other Ambulatory Visit: Payer: Self-pay

## 2012-09-24 DIAGNOSIS — I712 Thoracic aortic aneurysm, without rupture: Secondary | ICD-10-CM

## 2012-11-07 ENCOUNTER — Other Ambulatory Visit: Payer: Self-pay | Admitting: Cardiothoracic Surgery

## 2012-11-07 LAB — CREATININE, SERUM: Creat: 0.95 mg/dL (ref 0.50–1.35)

## 2012-11-07 LAB — BUN: BUN: 17 mg/dL (ref 6–23)

## 2012-11-13 ENCOUNTER — Ambulatory Visit (INDEPENDENT_AMBULATORY_CARE_PROVIDER_SITE_OTHER): Payer: Medicare PPO | Admitting: Cardiothoracic Surgery

## 2012-11-13 ENCOUNTER — Encounter: Payer: Self-pay | Admitting: Cardiothoracic Surgery

## 2012-11-13 ENCOUNTER — Ambulatory Visit
Admission: RE | Admit: 2012-11-13 | Discharge: 2012-11-13 | Disposition: A | Discharge status: Home / Self Care | Payer: Medicare PPO | Source: Ambulatory Visit | Attending: Cardiothoracic Surgery | Admitting: Cardiothoracic Surgery

## 2012-11-13 VITALS — BP 119/78 | HR 65 | Resp 20 | Ht 79.0 in | Wt 257.0 lb

## 2012-11-13 DIAGNOSIS — I712 Thoracic aortic aneurysm, without rupture: Secondary | ICD-10-CM

## 2012-11-13 MED ORDER — IOHEXOL 350 MG/ML SOLN
100.0000 mL | Freq: Once | INTRAVENOUS | Status: AC | PRN
  Administered 2012-11-13: 100 mL via INTRAVENOUS

## 2012-11-13 NOTE — Progress Notes (Signed)
[address]       [place] [address]             [phone]                                                       Ronald [name] [facility] Medical Record #[id number] Date of Birth: Jun 10, 1940  Referring: Ronald [name]* Primary Care: Ronald [name]  Chief Complaint:    Chief Complaint  Patient presents with  . Follow-up    9 month F/U with CTA Chest, surveillance of ascending aortic aneurysm     History of Present Illness:    Patient is [name] 72 year old male with known coronary occlusive disease having [name] stents placed in his right coronary artery 7 to 8 years ago. He is also known to have [name] dilated ascending aorta this is been followed since 2004 with serial CT scans. He returns today for follow up cta of the chest. Scans dating back to 2004 had been very consistent without change at 4.5 cm.  precipitated evaluation by cardiac The patient has no history of aortic insufficiency has no signs or symptoms of congestive heart failure he has no angina. There is no family history of aortic aneurysm or aortic dissections. His father died at age 31 in 46 sudden death while at [name] picnic attributed to "cardiac spasm". The patient returns today after [name] cardiac catheterization was performed last week.   Current Activity/ Functional Status: Patient is independent with mobility/ambulation, transfers, ADL's, IADL's.   Past Medical History  Diagnosis Date  . Hypertension   . Hypercholesteremia     managed by dr Waynard Edwards  . Ischemic heart disease     He had [name] stent in his right coronary artery in 2004; He does have [name] dual ostium of the left coronary system.   . Thoracic aortic aneurysm     last scan in Jan 2013. Now measuring 5.0cm. Referred to Dr. Tyrone Sage  . Malaria  patient had 3 episodes of malaria while serving in [place]   . SVT (supraventricular tachycardia)     remote episode during exercise test  . Lung nodules     noted on past CT scans     Past Surgical History  Procedure Laterality Date  . Cervical fusion  03-2010    c2-c7  . Rotator cuff surgery  1997  . Elbow surgery  1997  . Retinal detachment surgery  1975  . Coronary stent placement  2004    Family History  Problem Relation Age of Onset  . Heart disease Father     History   Social History  . Marital Status: Married    Spouse Name: N/[name]    Number of Children: 1  . Years of Education: N/[name]   Occupational History  . 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 Smoking status: Former Smoker    Quit date: 04/09/1969  . Smokeless tobacco: Former Neurosurgeon    Types: Chew    Quit date: 03/09/2010  . Alcohol Use: 0.5 oz/week    1 drink(s) per week     occasional  . Drug Use: No  . Sexually Active: Yes    Social History Narrative  .  patient's mother is currently undergoing treatment for metastatic terminal lung cancer     History  Smoking status  . Former Smoker  . Quit date: 04/09/1969  Smokeless tobacco  . Former Neurosurgeon  . Types: Chew  . Quit date: 03/09/2010    History  Alcohol Use  . 0.5 oz/week  . 1 drink(s) per week    Comment: occasional     Allergies  Allergen Reactions  . Vesicare (Solifenacin Succinate) Itching and Rash  . Statins Other (See Comments)    myalgias  . Promethazine Hcl     Agitation and incoherence    Current Outpatient Prescriptions  Medication Sig Dispense Refill  . aspirin 325 MG tablet Take 325 mg by mouth daily.        . Coenzyme Q10 (COQ-10) 200 MG CAPS Take 1 capsule by mouth daily.        . colesevelam (WELCHOL) 625 MG tablet Take 1,875 mg by mouth 2 (two) times daily with [name] meal.        . ezetimibe (ZETIA) 10 MG tablet Take 5 mg by mouth daily.        . fluticasone (FLONASE) 50 MCG/ACT nasal spray Place 1 spray into the nose daily. As needed      . Glucosamine-Chondroit-Vit C-Mn (GLUCOSAMINE CHONDR 1500 COMPLX) CAPS Take 2 capsules by mouth  daily. With Vit. D      . lisinopril (PRINIVIL,ZESTRIL) 40 MG tablet Take 20 mg by mouth daily.        . Omega-3 Fatty Acids (FISH OIL) 1000 MG CAPS Take 6 capsules by mouth daily.        Marland Kitchen omeprazole (PRILOSEC) 20 MG capsule Take 20 mg by mouth daily.        . Pitavastatin Calcium (LIVALO) 2 MG TABS Take by mouth. 1 tab every day       No current facility-administered medications for this visit.       Review of Systems:     Cardiac Review of Systems: Y or N  Chest Pain [  n  ]  Resting SOB [ n  ] Exertional SOB  [ n ]  Orthopnea [n  ]   Pedal Edema [  mild ]    Palpitations [ n ] Syncope  [ n ]   Presyncope [ n  ]  General Review of Systems: [Y] = yes [  ]=no Constitional: recent weight change [  ]; anorexia [  ]; fatigue [  ]; nausea [  ]; night sweats [  ]; fever [  ]; or chills [  ];                                                                                                                                          Dental: poor dentition[ y caps]; Last Dentist visit: last 6 months  Eye : blurred vision [  ];  diplopia [   ]; vision changes [  ];  Amaurosis fugax[  ]; Resp: cough [  ];  wheezing[  ];  hemoptysis[  ]; shortness of breath[  ]; paroxysmal nocturnal dyspnea[  ]; dyspnea on exertion[  ]; or orthopnea[  ];  GI:  gallstones[  ], vomiting[  ];  dysphagia[  ]; melena[  ];  hematochezia [  ]; heartburn[  ];   Hx of  Colonoscopy[y  ]; GU: kidney stones [  ]; hematuria[  ];   dysuria [ y ];  nocturia[  y];  history of     obstruction [ n ];             Skin: rash, swelling[  ];, hair loss[  ];  peripheral edema[  ];  or itching[  ]; Musculosketetal: myalgias[  ];  joint swelling[  ];  joint erythema[  ];  joint pain[  ];  back pain[  ];  Heme/Lymph: bruising[  ];  bleeding[  ];  anemia[  ];  Neuro: TIA[  ];  headaches[  ];  stroke[  ];  vertigo[  ];  seizures[  ];   paresthesias[  ];  difficulty walking[  ];  Psych:depression[  ]; anxiety[  ];  Endocrine: diabetes[ n ];   thyroid dysfunction[ n ];  Immunizations: Flu [ y ]; Pneumococcal[y  ]; and shingles  Other:  Physical Exam: BP 119/78  Pulse 65  Resp 20  Ht 6\' 7"  (2.007 m)  Wt 257 lb (116.574 kg)  BMI 28.94 kg/m2  SpO2 99%  General appearance: alert, cooperative, appears stated age and no distress Neurologic: intact Heart: regular rate and rhythm, S1, S2 normal, no murmur, click, rub or gallop Lungs: clear to auscultation bilaterally Abdomen: soft, non-tender; bowel sounds normal; no masses,  no organomegaly Extremities: extremities normal, atraumatic, no cyanosis or edema, Homans sign is negative, no sign of DVT and varicose veins noted no adenopathy cervical or axillary Patient has large body stature with [name] body surface area of 2.6 however he has no features of Marfan's,  Diagnostic Studies & Laboratory data:     Recent Radiology Findings: Ct Angio Chest Aorta W/cm &/or Wo/cm  11/13/2012   *RADIOLOGY REPORT*  Clinical Data: Aortic aneurysm  CT ANGIOGRAPHY CHEST  Technique:  Multidetector CT imaging of the chest using the standard protocol during bolus administration of intravenous contrast. Multiplanar reconstructed images including MIPs were obtained and reviewed to evaluate the vascular anatomy.  Contrast: OMNIPAQUE IOHEXOL 350 MG/ML SOLN  Comparison: 12/26/2011  Findings: Aortic diameter at the sinus of Valsalva, sinotubular junction, and ascending aorta are 5.0 cm, 3.7 cm, and 4.6 cm respectively.  No evidence of aortic dissection or transection. Little if any plaque within the thoracic aorta.  Innominate artery, right vertebral artery, right subclavian artery, right common carotid artery, left common carotid artery, left subclavian artery, and left vertebral artery are all widely patent within the confines of the examination.  Right coronary artery stent is noted.  Calcification in the circumflex and left anterior descending coronary vessels are noted. Distal right coronary artery  calcifications are noted.  No obvious filling defects in the pulmonary arterial tree to suggest acute pulmonary thromboembolism.  Negative abnormal mediastinal adenopathy.  No pericardial effusion.  Sub centimeter right thyroid hypodensity on image 17 of series 4.  No pneumothorax.  No pleural effusion.  Calcified lymph nodes in the mediastinum or are noted.  Sub centimeter pulmonary nodules are stable.  Cervical spine fusion  hardware is in place.  The right-sided screw within the C7 vertebral body has retracted and is within the prevertebral soft tissues.  Images of the upper abdomen demonstrate diffuse hepatic steatosis. Prominence of the adrenal glands without focal mass is stable.  IMPRESSION: Maximal diameter of the ascending aorta is 4.6 cm.  Previously, it was measured at 4.9 cm.  It is therefore not enlarging.  No evidence of aortic dissection.  Sub centimeter right thyroid hypodensity is stable in retrospect compared with [name] study from 2012.  This supports benign etiology.  Cervical spine fusion hardware is in place.  One of the screws in C7 has retracted and is residing within the prevertebral soft tissues.   Original Report Authenticated By: Jolaine Click, M.D.    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  Follow-up study.  CT ANGIOGRAPHY OF THE HEART, CORONARY ARTERY, STRUCTURE, AND MORPHOLOGY  CONTRAST:  100 ml of Omnipaque-300.  COMPARISON:  CTA chest 05/14/2011.  TECHNIQUE:  CT angiography of the coronary vessels was performed on [name] 256 channel system using prospective ECG gating.  [name] scout and noncontrast exam (for calcium scoring) were performed.  Circulation time was measured using [name] test bolus.  Coronary CTA was performed with sub mm slice collimation during portions of the cardiac cycle after prior injection of iodinated contrast.  Imaging post processing was  performed on an independent workstation creating multiplanar and 3-D images, and quantitative analysis of the heart and coronary arteries.  Note that this exam targets the heart and the chest was not imaged in its entirety.  PREMEDICATION: Lopressor 5 mg, IV Nitroglycerin 400 mcg, sublingual.  FINDINGS: Technical quality:  Good  Heart rate:  55-60  CORONARY ARTERIES: Left main coronary artery:  Not present (there are separate ostia for the left anterior descending and left circumflex coronary arteries directly off the left sinus of Valsalva). Left anterior descending:  Mildly diseased with mixed calcified noncalcified atherosclerotic plaque with areas of stenosis up to 25 - 50% in the mid LAD. Left circumflex:  Large caliber vessel that quickly gives rise to two large obtuse marginal vessels.  Proximal left circumflex demonstrates only minimal calcified plaque but no associated stenosis. Obtuse Marginal 1: Mildly diseased with calcified plaque with no associated luminal stenosis. Obtuse Marginal 2: Extensive calcified atherosclerotic plaque proximally with stenosis >50% (possibly >75%), however, secondary to the high degree of calcification, accurate assessment for luminal stenosis is not possible on this examination. Right coronary artery:  There is [name] stent in the proximal right coronary artery which is grossly patent.  There is some calcified plaque at the distal end of the stent where the vessel is slightly acutely angulated.  There appears to be some stenosis in this region, however, it is favored to be approximately 25 - 50% (accurate assessment is limited by the high-density the stent material and the heavily calcified plaque).  The remainder of the RCA is otherwise mildly diseased with nonobstructive plaque. Posterior descending artery:  Patent with [name] mixed calcified noncalcified plaque.  Secondary to the small size of the vessel and excessive image noise, accurate assessment for stenosis is not possible on  this examination, however, there appears to be at least 25 - 50% stenosis of the mid PDA. Dominance:  Right  CORONARY CALCIUM: Total Agatston Score:  655 MESA database percentile:  80th Comment - it appears as though  the patient has moved or breathed during image acquisition, such that the [name] portion of the coronary arteries was incompletely visualized.  This likely underestimates the patient's true calcium score.  AORTA AND PULMONARY MEASUREMENTS: Aortic root (21 - 40 mm):             29 mm  at the annulus             49 mm  at the sinuses of Valsalva         36 mm  at the sinotubular junction Ascending aorta ( <  40 mm):  50 mm Aortic arch : 34 mm Descending aorta:  28 mm Main pulmonary artery:  ( <  30 mm):  35 mm  EXTRACARDIAC FINDINGS: Several tiny pulmonary nodules measuring 4 mm are scattered throughout the lungs bilaterally and are completely unchanged compared to prior examinations (dating back to 2007); these can be considered radiographically benign requiring no further imaging follow-up.  Small calcified granulomas are also noted in the posterior aspect of the left upper lobe.  Multiple calcified left hilar and mediastinal lymph nodes are noted.  No acute consolidative airspace disease.  No pleural effusions.  Visualized portions of the upper abdomen are unremarkable.  IMPRESSION:  1. Ascending thoracic aortic aneurysm is similar in size measuring 4.9 cm on today's examination.  No evidence of thoracic aortic dissection at this time. 2.  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  The patient's aortic valve appears to be tricuspid.  Additionally, there is no effacement of the sinotubular junction. 3.  Multivessel coronary artery disease, as detailed above.  Most importantly, there is [name] heavily calcified plaque in the obtuse marginal 2 branch ([name] large vessel) which appears to be stenotic (i.e., at least 50% diameter stenosis, and possbily in  excess of 75%), however, accurate assessment for percentage stenosis is limited on this examination secondary to the high degree of calcification. Clinical correlation is recommended, with consideration for further evaluation with stress testing if there is clinical concern for inducible ischemia. 4.  Coronary artery calcium scoring is at least 655 (the patient moved during the image acquisition, resulting in incomplete coverage of the coronary arteries which likely underestimates the true calcium score), which is 80th percentile for patient's of matched age, gender and race/ethnicity. 5.  Right coronary artery dominance.   Original Report Authenticated By: Florencia Reasons, M.D.     Recent Lab Findings: Lab Results  Component Value Date   WBC 11.1* 06/06/2011   HGB 14.8 06/06/2011   HCT 43.6 06/06/2011   PLT 267.0 06/06/2011   GLUCOSE 119* 11/26/2011   CHOL 184 11/14/2010   TRIG 143.0 11/14/2010   HDL 48.60 11/14/2010   LDLCALC 107* 11/14/2010   ALT 23 11/14/2010   AST 24 11/14/2010   NA 142 11/26/2011   K 4.5 11/26/2011   CL 110 11/26/2011   CREATININE 0.95 11/07/2012   BUN 17 11/07/2012   CO2 26 11/26/2011   INR 1.0 06/06/2011   SKANDA WORLDS  [id number]  3/1/201312:05 PM  Ronald Ran A, MD, [name]  Procedure Performed:  1. Left Heart Catheterization 2. Selective Coronary Angiography 3. Left ventricular angiogram 4. Aortic root angiogram Operator: Verne Carrow, [name]  Indication: Pt with known CAD and thoracic aortic aneurysm. The aortic aneurysm has enlarged over the last year and we recently referred Mr. Brager to see Dr. Tyrone Sage. Plans are being made for aortic root replacement. Cardiac cath  today to exclude progression of CAD.  Procedure Details:  The risks, benefits, complications, treatment options, and expected outcomes were discussed with the patient. The patient and/or family concurred with the proposed plan, giving informed consent. The patient was brought to the cath lab after IV  hydration was begun and oral premedication was given. The patient was further sedated with Versed and Fentanyl. The right groin was prepped and draped in the usual manner. Using the modified Seldinger access technique, [name] 4 French sheath was placed in the right femoral artery. [name] JL-5 catheter was used to engage the LAD with sub-selective shots of the Circumflex. An AL-1 catheter was used to non-selectively inject the Circumflex artery. [name] 3DRC catheter was used to engage and inject the RCA. [name] pigtail catheter was used to perform [name] left ventricular angiogram. The catheter was pulled back across the aortic valve. An aortic root angiogram was performed.  There were no immediate complications. The patient was taken to the recovery area in stable condition.  Hemodynamic Findings:  Central aortic pressure: 130/72  Left ventricular pressure: 130/17/24  Angiographic Findings:  Left main: There are separate ostia for the LAD and Circumflex.  Left Anterior Descending Artery: Large vessel that courses to the apex. No obstructive disease noted.Small diagonal branch.  Circumflex Artery: Large caliber vessel with no obstructive disease noted.  Right Coronary Artery: Very large, dominant vessel with patent proximal stent. There is mild plaque disease in the mid and distal RCA. The small caliber PDA has 60% stenosis.  Left Ventricular Angiogram: Dilated LV. LVEF=50%.  Aortic root: Dilated aortic root and ascending aorta.  Impression:  1. 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 Preserved LV systolic function  3. 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     Aortic Size Index=     4.6    /Body surface area is 2.55 meters squared. = 1.8  < 2.75 cm/m2      4% risk per year 2.75 to 4.25          8% risk per year > 4.25 cm/m2    20% risk per year  cross sectional area of aorta cm2/height in meters > 10 consider  surgery 16.6/2= 8.3    Assessment / Plan:    Known dilated ascending aorta since at  least 2004,   The symptoms,  risks and lethal nature of acute aortic dissection was explained to him and his wife. CT scan reviewed with patient with current size recommend continued obervation No beta blocker per cardiology    I plan to see him back in 12  months with repeat cta.    Delight Ovens [name]  Beeper [phone] Office [phone] 11/13/2012 10:50 AM

## 2012-12-03 ENCOUNTER — Encounter: Payer: Self-pay | Admitting: Cardiovascular Disease

## 2012-12-03 ENCOUNTER — Ambulatory Visit (INDEPENDENT_AMBULATORY_CARE_PROVIDER_SITE_OTHER): Payer: Medicare PPO | Admitting: Cardiovascular Disease

## 2012-12-03 VITALS — BP 120/74 | HR 68 | Ht 79.0 in | Wt 262.0 lb

## 2012-12-03 DIAGNOSIS — I2510 Atherosclerotic heart disease of native coronary artery without angina pectoris: Secondary | ICD-10-CM

## 2012-12-03 DIAGNOSIS — I712 Thoracic aortic aneurysm, without rupture, unspecified: Secondary | ICD-10-CM

## 2012-12-03 NOTE — Patient Instructions (Addendum)
Your physician wants you to follow-up in:  12 months.  You will receive a reminder letter in the mail two months in advance. If you don't receive a letter, please call our office to schedule the follow-up appointment.   

## 2012-12-03 NOTE — Progress Notes (Signed)
History of Present Illness: 72 yo WM with h/o CAD s/p stent RCA 2004, HTN, HLD, dilated aortic root (Ascending aortic aneurysm) here today for cardiac follow up. He has been followed in the past by Dr. Deborah Chalk. Last stress test was March 2010 and there was no ischemia. Echo August 2012 with LVEF 55-60%. CTA chest February 2013 with questionable enlargement of thoracic aorta. He was seen by Dr. Tyrone Sage and he felt the aorta size was stable.   He tells me today that he has had no exertional chest pain, SOB, dizziness, near syncope or syncope. He had one episode of hearburn followed by diarrhea two weeks ago. No recurrence. He does not tolerate statins which caused muscle aches and cramping but has tolerated Welchol and Zetia. He has been active. No beta blocker secondary to bradycardia.   Primary Care Physician: [name]   Last Lipid Profile: Followed in primary care  Past Medical History  Diagnosis Date  . Hypertension   . Hypercholesteremia     managed by dr Waynard Edwards  . 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   . Thoracic aortic aneurysm     last scan in Jan 2013. Now measuring 5.0cm. Referred to Dr. Tyrone Sage  . Malaria   . SVT (supraventricular tachycardia)     remote episode during exercise test  . Lung nodules     noted on past CT scans  . Hx of Bell's palsy     left face    Past Surgical History  Procedure Laterality Date  . Cervical fusion  03-2010    c2-c7  . Rotator cuff surgery  1997  . Elbow surgery  1997  . Retinal detachment surgery  1975  . Coronary stent placement  2004    Current Outpatient Prescriptions  Medication Sig Dispense Refill  . aspirin 325 MG tablet Take 325 mg by mouth daily.        . Coenzyme Q10 (COQ-10) 200 MG CAPS Take 1 capsule by mouth daily.        . colesevelam (WELCHOL) 625 MG tablet Take 1,875 mg by mouth 2 (two) times daily with a meal.         . ezetimibe (ZETIA) 10 MG tablet Take 5 mg by mouth daily.        . fluticasone (FLONASE) 50 MCG/ACT nasal spray Place 1 spray into the nose daily. As needed      . Glucosamine-Chondroit-Vit C-Mn (GLUCOSAMINE CHONDR 1500 COMPLX) CAPS Take 2 capsules by mouth daily. With Vit. D      . lisinopril (PRINIVIL,ZESTRIL) 40 MG tablet Take 20 mg by mouth daily.        . Omega-3 Fatty Acids (FISH OIL) 1000 MG CAPS Take 6 capsules by mouth daily.        Marland Kitchen omeprazole (PRILOSEC) 20 MG capsule Take 20 mg by mouth daily.        . Pitavastatin Calcium (LIVALO) 2 MG TABS Take by mouth. 1 tab every day       No current facility-administered medications for this visit.    Allergies  Allergen Reactions  . Vesicare [Solifenacin Succinate] Itching and Rash  . Statins Other (See Comments)    myalgias  . Promethazine Hcl     Agitation and incoherence    History   Social History  . Marital Status: Married    Spouse  Name: N/A    Number of Children: 1  . Years of Education: N/A   Occupational History  . retired Engineer, site    Social History Main Topics  . Smoking status: Former Smoker    Quit date: 04/09/1969  . Smokeless tobacco: Former Neurosurgeon    Types: Chew    Quit date: 03/09/2010  . Alcohol Use: 0.5 oz/week    1 drink(s) per week     Comment: occasional  . Drug Use: No  . Sexual Activity: Yes   Other Topics Concern  . Not on file   Social History Narrative  . No narrative on file    Family History  Problem Relation Age of Onset  . Heart disease Father     Review of Systems:  As stated in the HPI and otherwise negative.   BP 120/74  Pulse 68  Ht 6\' 7"  (2.007 m)  Wt 262 lb (118.842 kg)  BMI 29.5 kg/m2  Physical Examination: General: Well developed, well nourished, NAD HEENT: OP clear, mucus membranes moist SKIN: warm, dry. No rashes. Neuro: No focal deficits Musculoskeletal: Muscle strength 5/5 all ext Psychiatric: Mood and affect normal Neck: No JVD, no carotid  bruits, no thyromegaly, no lymphadenopathy. Lungs:Clear bilaterally, no wheezes, rhonci, crackles Cardiovascular: Regular rate and rhythm. No murmurs, gallops or rubs. Abdomen:Soft. Bowel sounds present. Non-tender.  Extremities: No lower extremity edema. Pulses are 2 + in the bilateral DP/PT.  EKG: NSR, rate 62 bpm. Incomplete RBBB  Cardiac cath 06/08/11: Left main: There are separate ostia for the LAD and Circumflex.  Left Anterior Descending Artery: Large vessel that courses to the apex. No obstructive disease noted.Small diagonal branch.  Circumflex Artery: Large caliber vessel with no obstructive disease noted.  Right Coronary Artery: Very large, dominant vessel with patent proximal stent. There is mild plaque disease in the mid and distal RCA. The small caliber PDA has 60% stenosis.  Left Ventricular Angiogram: Dilated LV. LVEF=50%.  Aortic root: Dilated aortic root and ascending aorta.  Impression:  1. 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 Preserved LV systolic function  3. 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    Assessment and Plan:   1. CAD: He has no angina. Last cath March 2013 with stable disease. Will continue ASA, statin. BP and lipids well controlled.   2. Thoracic Aortic aneurysm: Followed closely by Dr. Tyrone Sage. No plans for surgical intervention as this has been stable for years. NO beta blocker at this time secondary to bradycardia. BP is well controlled.

## 2013-02-02 ENCOUNTER — Other Ambulatory Visit: Payer: Self-pay | Admitting: Internal Medicine

## 2013-02-02 DIAGNOSIS — M545 Low back pain: Secondary | ICD-10-CM

## 2013-02-10 ENCOUNTER — Ambulatory Visit
Admission: RE | Admit: 2013-02-10 | Discharge: 2013-02-10 | Disposition: A | Discharge status: Home / Self Care | Payer: Medicare PPO | Source: Ambulatory Visit | Attending: Internal Medicine | Admitting: Internal Medicine

## 2013-02-10 DIAGNOSIS — M545 Low back pain: Secondary | ICD-10-CM

## 2013-05-10 HISTORY — PX: SHOULDER ARTHROSCOPY: SHX128

## 2013-05-22 ENCOUNTER — Encounter: Payer: Self-pay | Admitting: Cardiovascular Disease

## 2013-05-22 ENCOUNTER — Ambulatory Visit (INDEPENDENT_AMBULATORY_CARE_PROVIDER_SITE_OTHER): Payer: Medicare PPO | Admitting: Cardiovascular Disease

## 2013-05-22 VITALS — BP 138/90 | HR 68 | Ht 79.0 in | Wt 267.0 lb

## 2013-05-22 DIAGNOSIS — I2510 Atherosclerotic heart disease of native coronary artery without angina pectoris: Secondary | ICD-10-CM

## 2013-05-22 DIAGNOSIS — Z01810 Encounter for preprocedural cardiovascular examination: Secondary | ICD-10-CM

## 2013-05-22 DIAGNOSIS — IMO0001 Reserved for inherently not codable concepts without codable children: Secondary | ICD-10-CM

## 2013-05-22 DIAGNOSIS — I712 Thoracic aortic aneurysm, without rupture, unspecified: Secondary | ICD-10-CM

## 2013-05-22 NOTE — Patient Instructions (Signed)
Your physician wants you to follow-up in:  September 2015. You will receive a reminder letter in the mail two months in advance. If you don't receive a letter, please call our office to schedule the follow-up appointment.

## 2013-05-22 NOTE — Progress Notes (Signed)
History of Present Illness:72 yo WM with h/o CAD s/p stent RCA 2004, HTN, HLD, dilated aortic root (Ascending aortic aneurysm) here today for cardiac follow up. He has been followed in the past by Dr. Deborah Chalk. Last stress test was March 2010 and there was no ischemia. Echo August 2012 with LVEF 55-60%. CTA chest February 2013 with questionable enlargement of thoracic aorta. He was seen by Dr. Tyrone Sage and he felt the aorta size was stable.   He tells me today that he has had no exertional chest pain, SOB, dizziness, near syncope or syncope. He has had muscle pains in the past on statins. He has been having muscle aches lately on Livalo and Zetia. This has been managed in primary care. He has been active. No beta blocker secondary to bradycardia.   Primary Care Physician: [name]   Last Lipid Profile: Followed in primary care  Past Medical History  Diagnosis Date  . Hypertension   . Hypercholesteremia     managed by dr Waynard Edwards  . 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   . Thoracic aortic aneurysm     last scan in Jan 2013. Now measuring 5.0cm. Referred to Dr. Tyrone Sage  . Malaria   . SVT (supraventricular tachycardia)     remote episode during exercise test  . Lung nodules     noted on past CT scans  . Hx of Bell's palsy     left face    Past Surgical History  Procedure Laterality Date  . Cervical fusion  03-2010    c2-c7  . Rotator cuff surgery  1997  . Elbow surgery  1997  . Retinal detachment surgery  1975  . Coronary stent placement  2004    Current Outpatient Prescriptions  Medication Sig Dispense Refill  . aspirin 325 MG tablet Take 325 mg by mouth daily.        . Coenzyme Q10 (COQ-10) 200 MG CAPS Take 1 capsule by mouth daily.        . colesevelam (WELCHOL) 625 MG tablet Take 1,875 mg by mouth 2 (two) times daily with a meal.        . ezetimibe (ZETIA) 10 MG tablet  Take 5 mg by mouth daily. Take 1 1/2 tab Tuesday ,Friday      . fluticasone (FLONASE) 50 MCG/ACT nasal spray Place 1 spray into the nose daily. As needed      . Glucosamine-Chondroit-Vit C-Mn (GLUCOSAMINE CHONDR 1500 COMPLX) CAPS Take 2 capsules by mouth daily. With Vit. D      . lisinopril (PRINIVIL,ZESTRIL) 40 MG tablet Take 20 mg by mouth daily.        . Omega-3 Fatty Acids (FISH OIL) 1000 MG CAPS Take 6 capsules by mouth daily.        Marland Kitchen omeprazole (PRILOSEC) 20 MG capsule Take 20 mg by mouth daily.        . Pitavastatin Calcium (LIVALO) 2 MG TABS Take 2 mg by mouth 2 (two) times a week. 1 tab Monday, Thursday       No current facility-administered medications for this visit.    Allergies  Allergen Reactions  . Vesicare [Solifenacin Succinate] Itching and Rash  . Statins Other (See Comments)    myalgias  . Promethazine Hcl     Agitation and incoherence    History   Social History  .  Marital Status: Married    Spouse Name: N/A    Number of Children: 1  . Years of Education: N/A   Occupational History  . retired Engineer, siteschool teacher    Social History Main Topics  . Smoking status: Former Smoker    Quit date: 04/09/1969  . Smokeless tobacco: Former NeurosurgeonUser    Types: Chew    Quit date: 03/09/2010  . Alcohol Use: 0.5 oz/week    1 drink(s) per week     Comment: occasional  . Drug Use: No  . Sexual Activity: Yes   Other Topics Concern  . Not on file   Social History Narrative  . No narrative on file    Family History  Problem Relation Age of Onset  . Heart disease Father     Review of Systems:  As stated in the HPI and otherwise negative.   BP 138/90  Pulse 68  Ht 6\' 7"  (2.007 m)  Wt 267 lb (121.11 kg)  BMI 30.07 kg/m2  Physical Examination: General: Well developed, well nourished, NAD HEENT: OP clear, mucus membranes moist SKIN: warm, dry. No rashes. Neuro: No focal deficits Musculoskeletal: Muscle strength 5/5 all ext Psychiatric: Mood and affect  normal Neck: No JVD, no carotid bruits, no thyromegaly, no lymphadenopathy. Lungs:Clear bilaterally, no wheezes, rhonci, crackles Cardiovascular: Regular rate and rhythm. No murmurs, gallops or rubs. Abdomen:Soft. Bowel sounds present. Non-tender.  Extremities: No lower extremity edema. Pulses are 2 + in the bilateral DP/PT.  Cardiac cath 06/08/11: Left main: There are separate ostia for the LAD and Circumflex.  Left Anterior Descending Artery: Large vessel that courses to the apex. No obstructive disease noted.Small diagonal branch.  Circumflex Artery: Large caliber vessel with no obstructive disease noted.  Right Coronary Artery: Very large, dominant vessel with patent proximal stent. There is mild plaque disease in the mid and distal RCA. The small caliber PDA has 60% stenosis.  Left Ventricular Angiogram: Dilated LV. LVEF=50%.  Aortic root: Dilated aortic root and ascending aorta.  Impression:  1. 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 Preserved LV systolic function  3. 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   Assessment and Plan:   1. CAD: He has no angina. Last cath March 2013 with stable disease. Will continue ASA, statin. BP and lipids well controlled.   2. Thoracic Aortic aneurysm: Followed closely by Dr. Tyrone SageGerhardt. No plans for surgical intervention as this has been stable for years. NO beta blocker at this time secondary to bradycardia. BP is well controlled. He will see Dr. Tyrone SageGerhardt in August and have CTA repeated then.   3. Pre-operative cardiovascular examination: He has had no recent chest pain worrisome for angina. He is very active. Only limited by his muscle pains. He does not need an ischemic workup prior to the planned surgical procedure.

## 2013-05-25 ENCOUNTER — Telehealth: Payer: Self-pay | Admitting: Cardiovascular Disease

## 2013-05-25 NOTE — Telephone Encounter (Signed)
Received request from Nurse fax box, documents faxed for surgical clearance. [facility] Fax number: [phone] Attention: [id number]/kdm

## 2013-06-22 ENCOUNTER — Telehealth: Payer: Self-pay | Admitting: *Deleted

## 2013-06-22 NOTE — Telephone Encounter (Signed)
I received my chart message from pt's wife regarding pt.  (message in pt's wife's chart--Judy Gaunce-DOB- 08/12/1939). I responded to my chart message that I would call her. I placed call to pt's wife and left message to call back.

## 2013-06-22 NOTE — Telephone Encounter (Signed)
Spoke with pt's wife and answered her questions about location of stent placement in 2004.

## 2013-06-22 NOTE — Telephone Encounter (Signed)
Follow up ° ° ° ° ° °Returning Pat's call °

## 2013-09-07 ENCOUNTER — Encounter: Payer: Self-pay | Admitting: Cardiology

## 2013-10-05 ENCOUNTER — Other Ambulatory Visit: Payer: Self-pay | Admitting: Orthopedic Surgery

## 2013-10-07 ENCOUNTER — Encounter (HOSPITAL_BASED_OUTPATIENT_CLINIC_OR_DEPARTMENT_OTHER): Payer: Self-pay | Admitting: *Deleted

## 2013-10-07 NOTE — Progress Notes (Signed)
10/07/13 1248  OBSTRUCTIVE SLEEP APNEA  Have you ever been diagnosed with sleep apnea through a sleep study? No  Do you snore loudly (loud enough to be heard through closed doors)?  0  Do you often feel tired, fatigued, or sleepy during the daytime? 0  Has anyone observed you stop breathing during your sleep? 0  Do you have, or are you being treated for high blood pressure? 1  BMI more than 35 kg/m2? 0  Age over 215 years old? 1  Neck circumference greater than 40 cm/16 inches? 1  Gender: 1  Obstructive Sleep Apnea Score 4  Score 4 or greater  Results sent to PCP
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
## 2013-10-11 NOTE — H&P (Signed)
Ronald [name] is an 73 y.o. male.   CC / Reason for Visit: Left long finger triggering HPI: This patient returns for reevaluation.  He indicates that the injection provided on 04-30-13 caused the catching to be improved for about 10 days, but then it recurred.  He has been in the midst of recovering from shoulder surgery.  He would like to proceed with more definitive treatment.    Presenting history follows: This patient is a 73 year old male who presents for evaluation of his left long finger.  He reports that it is locked in flexion in the mornings, but loosens up some throughout the day.  He also has a cord that has developed in the palm.  Past Medical History  Diagnosis Date  . Hypertension   . Hypercholesteremia     managed by dr Ronald [name]  . 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   . Thoracic aortic aneurysm     last scan in Jan 2013. Now measuring 5.0cm. Referred to Dr. [name]  . Malaria   . SVT (supraventricular tachycardia)     remote episode during exercise test  . Lung nodules     noted on past CT scans  . Hx of Bell's palsy     left face  . Wears glasses   . Wears dentures     bottom    Past Surgical History  Procedure Laterality Date  . Cervical fusion  03-2010    c2-c7  . Rotator cuff surgery  1997    lt  . Elbow surgery  1997    lt and rt  . Retinal detachment surgery  1975    lt  . Coronary stent placement  2004  . Tonsillectomy    . Eye surgery    . Colonoscopy      x4  . Shoulder arthroscopy  2/15    right-got cardiac clearance-[facility]    Family History  Problem Relation Age of Onset  . Heart disease Father    Social History:  reports that he quit smoking about 44 years ago. He quit smokeless tobacco use about 3 years ago. His smokeless tobacco use included Chew. He reports that he drinks about .5 ounces of alcohol per week. He reports that he does not use  illicit drugs.  Allergies:  Allergies  Allergen Reactions  . Vesicare [Solifenacin Succinate] Itching and Rash  . Statins Other (See Comments)    myalgias  . Promethazine Hcl     Agitation and incoherence    No prescriptions prior to admission    No results found for this or any previous visit (from the past 48 hour(s)). No results found.  Review of Systems  All other systems reviewed and are negative.   Height 6\' 7"  (2.007 m), weight 121.11 kg (267 lb). Physical Exam  Constitutional:  WD, WN, NAD HEENT:  NCAT, EOMI Neuro/Psych:  Alert & oriented to person, place, and time; appropriate mood & affect Lymphatic: No generalized UE edema or lymphadenopathy Extremities / MSK:  Both UE are normal with respect to appearance, ranges of motion, joint stability, muscle strength/tone, sensation, & perfusion except as otherwise noted:  Left long finger has a small pretendinous cord in the palm that causes no significant MP or distal contracture.  He does have tenderness over the A1 pulley, with catching with each flexion cycle  Labs /  Xrays:  No radiographic studies obtained today.  Assessment:  1.  Left hand Dupuytren's disease 2.  Left long finger stenosing tenosynovitis  Plan: We reviewed the treatment options.  He would like to proceed with surgical release.  Ronald MessierKathy will help to arrange this at a time that needs his approval.  The details of the operative procedure were discussed with the patient.  Questions were invited and answered.  In addition to the goal of the procedure, the risks of the procedure to include but not limited to bleeding; infection; damage to the nerves or blood vessels that could result in bleeding, numbness, weakness, chronic pain, and the need for additional procedures; stiffness; the need for revision surgery; and anesthetic risks, the worst of which is death, were reviewed.  No specific outcome was guaranteed or implied.  Informed consent was obtained.    Ronald [name] A. 10/11/2013, 12:13 PM

## 2013-10-12 ENCOUNTER — Ambulatory Visit (HOSPITAL_BASED_OUTPATIENT_CLINIC_OR_DEPARTMENT_OTHER): Payer: Medicare PPO | Admitting: Anesthesiology

## 2013-10-12 ENCOUNTER — Ambulatory Visit (HOSPITAL_BASED_OUTPATIENT_CLINIC_OR_DEPARTMENT_OTHER)
Admission: RE | Admit: 2013-10-12 | Discharge: 2013-10-12 | Disposition: A | Discharge status: Home / Self Care | Payer: Medicare PPO | Source: Ambulatory Visit | Attending: Orthopedic Surgery | Admitting: Orthopedic Surgery

## 2013-10-12 ENCOUNTER — Encounter (HOSPITAL_BASED_OUTPATIENT_CLINIC_OR_DEPARTMENT_OTHER)
Admission: RE | Disposition: A | Discharge status: Home / Self Care | Payer: Self-pay | Source: Ambulatory Visit | Attending: Orthopedic Surgery

## 2013-10-12 ENCOUNTER — Encounter (HOSPITAL_BASED_OUTPATIENT_CLINIC_OR_DEPARTMENT_OTHER): Payer: Medicare PPO | Admitting: Anesthesiology

## 2013-10-12 ENCOUNTER — Encounter (HOSPITAL_BASED_OUTPATIENT_CLINIC_OR_DEPARTMENT_OTHER): Payer: Self-pay | Admitting: Anesthesiology

## 2013-10-12 DIAGNOSIS — Z87891 Personal history of nicotine dependence: Secondary | ICD-10-CM | POA: Insufficient documentation

## 2013-10-12 DIAGNOSIS — Z9861 Coronary angioplasty status: Secondary | ICD-10-CM | POA: Insufficient documentation

## 2013-10-12 DIAGNOSIS — E7800 Pure hypercholesterolemia, unspecified: Secondary | ICD-10-CM | POA: Insufficient documentation

## 2013-10-12 DIAGNOSIS — I2510 Atherosclerotic heart disease of native coronary artery without angina pectoris: Secondary | ICD-10-CM | POA: Insufficient documentation

## 2013-10-12 DIAGNOSIS — M65849 Other synovitis and tenosynovitis, unspecified hand: Secondary | ICD-10-CM

## 2013-10-12 DIAGNOSIS — I10 Essential (primary) hypertension: Secondary | ICD-10-CM | POA: Insufficient documentation

## 2013-10-12 DIAGNOSIS — I498 Other specified cardiac arrhythmias: Secondary | ICD-10-CM | POA: Insufficient documentation

## 2013-10-12 DIAGNOSIS — I739 Peripheral vascular disease, unspecified: Secondary | ICD-10-CM | POA: Insufficient documentation

## 2013-10-12 DIAGNOSIS — M65839 Other synovitis and tenosynovitis, unspecified forearm: Secondary | ICD-10-CM | POA: Insufficient documentation

## 2013-10-12 DIAGNOSIS — M6530 Trigger finger, unspecified finger: Secondary | ICD-10-CM | POA: Insufficient documentation

## 2013-10-12 HISTORY — DX: Presence of dental prosthetic device (complete) (partial): Z97.2

## 2013-10-12 HISTORY — PX: TRIGGER FINGER RELEASE: SHX641

## 2013-10-12 HISTORY — DX: Presence of spectacles and contact lenses: Z97.3

## 2013-10-12 LAB — POCT I-STAT, CHEM 8
BUN: 18 mg/dL (ref 6–23)
CHLORIDE: 107 meq/L (ref 96–112)
CREATININE: 0.8 mg/dL (ref 0.50–1.35)
Calcium, Ion: 1.2 mmol/L (ref 1.13–1.30)
Glucose, Bld: 111 mg/dL — ABNORMAL HIGH (ref 70–99)
HEMATOCRIT: 47 % (ref 39.0–52.0)
Hemoglobin: 16 g/dL (ref 13.0–17.0)
Potassium: 3.9 mEq/L (ref 3.7–5.3)
Sodium: 143 mEq/L (ref 137–147)
TCO2: 25 mmol/L (ref 0–100)

## 2013-10-12 SURGERY — RELEASE, A1 PULLEY, FOR TRIGGER FINGER
Anesthesia: Monitor Anesthesia Care | Site: Finger | Laterality: Left

## 2013-10-12 MED ORDER — DEXTROSE 5 % IV SOLN
3.0000 g | INTRAVENOUS | Status: AC
  Administered 2013-10-12: 3 g via INTRAVENOUS

## 2013-10-12 MED ORDER — LIDOCAINE HCL 2 % IJ SOLN
INTRAMUSCULAR | Status: DC | PRN
  Administered 2013-10-12: 2.5 mL

## 2013-10-12 MED ORDER — LIDOCAINE HCL (CARDIAC) 20 MG/ML IV SOLN
INTRAVENOUS | Status: DC | PRN
  Administered 2013-10-12: 30 mg via INTRAVENOUS

## 2013-10-12 MED ORDER — HYDROCODONE-ACETAMINOPHEN 5-325 MG PO TABS: 1.0000 | ORAL_TABLET | ORAL | Status: DC | PRN

## 2013-10-12 MED ORDER — FENTANYL CITRATE 0.05 MG/ML IJ SOLN: 25.0000 ug | INTRAMUSCULAR | Status: DC | PRN

## 2013-10-12 MED ORDER — ONDANSETRON HCL 4 MG/2ML IJ SOLN: 4.0000 mg | Freq: Four times a day (QID) | INTRAMUSCULAR | Status: DC | PRN

## 2013-10-12 MED ORDER — FENTANYL CITRATE 0.05 MG/ML IJ SOLN
INTRAMUSCULAR | Status: DC | PRN
  Administered 2013-10-12 (×2): 50 ug via INTRAVENOUS

## 2013-10-12 MED ORDER — FENTANYL CITRATE 0.05 MG/ML IJ SOLN
INTRAMUSCULAR | Status: AC
  Filled 2013-10-12: qty 4

## 2013-10-12 MED ORDER — LACTATED RINGERS IV SOLN
INTRAVENOUS | Status: DC
  Administered 2013-10-12: 09:00:00 via INTRAVENOUS

## 2013-10-12 MED ORDER — BUPIVACAINE-EPINEPHRINE 0.5% -1:200000 IJ SOLN
INTRAMUSCULAR | Status: DC | PRN
  Administered 2013-10-12: 2.5 mL

## 2013-10-12 MED ORDER — OXYCODONE HCL 5 MG PO TABS: 5.0000 mg | ORAL_TABLET | Freq: Once | ORAL | Status: DC | PRN

## 2013-10-12 MED ORDER — BUPIVACAINE-EPINEPHRINE (PF) 0.5% -1:200000 IJ SOLN
INTRAMUSCULAR | Status: AC
  Filled 2013-10-12: qty 30

## 2013-10-12 MED ORDER — OXYCODONE HCL 5 MG/5ML PO SOLN: 5.0000 mg | Freq: Once | ORAL | Status: DC | PRN

## 2013-10-12 MED ORDER — CHLORHEXIDINE GLUCONATE 4 % EX LIQD: 60.0000 mL | Freq: Once | CUTANEOUS | Status: DC

## 2013-10-12 MED ORDER — LIDOCAINE HCL 2 % IJ SOLN
INTRAMUSCULAR | Status: AC
  Filled 2013-10-12: qty 20

## 2013-10-12 MED ORDER — MIDAZOLAM HCL 2 MG/2ML IJ SOLN
INTRAMUSCULAR | Status: AC
  Filled 2013-10-12: qty 2

## 2013-10-12 MED ORDER — MIDAZOLAM HCL 5 MG/5ML IJ SOLN
INTRAMUSCULAR | Status: DC | PRN
  Administered 2013-10-12: 2 mg via INTRAVENOUS

## 2013-10-12 MED ORDER — PROPOFOL INFUSION 10 MG/ML OPTIME
INTRAVENOUS | Status: DC | PRN
  Administered 2013-10-12: 50 ug/kg/min via INTRAVENOUS

## 2013-10-12 SURGICAL SUPPLY — 39 items
BANDAGE COBAN STERILE 2 (GAUZE/BANDAGES/DRESSINGS) ×3 IMPLANT
BLADE MINI RND TIP GREEN BEAV (BLADE) IMPLANT
BLADE SURG 15 STRL LF DISP TIS (BLADE) ×1 IMPLANT
BLADE SURG 15 STRL SS (BLADE) ×2
BNDG CMPR 9X4 STRL LF SNTH (GAUZE/BANDAGES/DRESSINGS) ×1
BNDG CONFORM 2 STRL LF (GAUZE/BANDAGES/DRESSINGS) ×3 IMPLANT
BNDG ESMARK 4X9 LF (GAUZE/BANDAGES/DRESSINGS) ×3 IMPLANT
CHLORAPREP W/TINT 26ML (MISCELLANEOUS) ×3 IMPLANT
COVER MAYO STAND STRL (DRAPES) ×3 IMPLANT
COVER TABLE BACK 60X90 (DRAPES) ×3 IMPLANT
CUFF TOURNIQUET SINGLE 18IN (TOURNIQUET CUFF) ×3 IMPLANT
DRAPE EXTREMITY T 121X128X90 (DRAPE) ×3 IMPLANT
DRAPE SURG 17X23 STRL (DRAPES) ×3 IMPLANT
DRSG EMULSION OIL 3X3 NADH (GAUZE/BANDAGES/DRESSINGS) ×3 IMPLANT
GLOVE BIO SURGEON STRL SZ7.5 (GLOVE) ×3 IMPLANT
GLOVE BIOGEL PI IND STRL 7.0 (GLOVE) ×1 IMPLANT
GLOVE BIOGEL PI IND STRL 8 (GLOVE) ×1 IMPLANT
GLOVE BIOGEL PI INDICATOR 7.0 (GLOVE) ×2
GLOVE BIOGEL PI INDICATOR 8 (GLOVE) ×2
GLOVE ECLIPSE 6.5 STRL STRAW (GLOVE) ×3 IMPLANT
GOWN STRL REUS W/ TWL LRG LVL3 (GOWN DISPOSABLE) ×2 IMPLANT
GOWN STRL REUS W/TWL LRG LVL3 (GOWN DISPOSABLE) ×4
NDL SAFETY ECLIPSE 18X1.5 (NEEDLE) IMPLANT
NEEDLE HYPO 18GX1.5 SHARP (NEEDLE)
NEEDLE HYPO 25X1 1.5 SAFETY (NEEDLE) ×3 IMPLANT
NS IRRIG 1000ML POUR BTL (IV SOLUTION) ×3 IMPLANT
PACK BASIN DAY SURGERY FS (CUSTOM PROCEDURE TRAY) ×3 IMPLANT
PADDING CAST ABS 4INX4YD NS (CAST SUPPLIES)
PADDING CAST ABS COTTON 4X4 ST (CAST SUPPLIES) IMPLANT
PADDING CAST COTTON 2X4 NS (CAST SUPPLIES) ×3 IMPLANT
SPONGE GAUZE 4X4 12PLY STER LF (GAUZE/BANDAGES/DRESSINGS) ×3 IMPLANT
STOCKINETTE 4X48 STRL (DRAPES) ×3 IMPLANT
SUT VICRYL RAPIDE 4-0 (SUTURE) ×3 IMPLANT
SUT VICRYL RAPIDE 4/0 PS 2 (SUTURE) IMPLANT
SYR BULB 3OZ (MISCELLANEOUS) ×3 IMPLANT
SYRINGE 12CC LL (MISCELLANEOUS) ×3 IMPLANT
TOWEL OR 17X24 6PK STRL BLUE (TOWEL DISPOSABLE) ×3 IMPLANT
TOWEL OR NON WOVEN STRL DISP B (DISPOSABLE) IMPLANT
UNDERPAD 30X30 INCONTINENT (UNDERPADS AND DIAPERS) ×3 IMPLANT

## 2013-10-12 NOTE — Op Note (Signed)
10/12/2013  9:48 AM  PATIENT:  Ronald [name]  73 y.o. male  PRE-OPERATIVE DIAGNOSIS:  Left long finger stenosing tenosynovitis  POST-OPERATIVE DIAGNOSIS:  Same  PROCEDURE:  Left long finger trigger release and tendon debridement  SURGEON: Cliffton Astersavid A. Janee Mornhompson, MD  PHYSICIAN ASSISTANT: None  ANESTHESIA:  local and MAC  SPECIMENS:  None  DRAINS:   None  PREOPERATIVE INDICATIONS:  Ronald [name] is a  73 y.o. male with left long finger stenosing tenosynovitis, having failed nonoperative management  The risks benefits and alternatives were discussed with the patient preoperatively including but not limited to the risks of infection, bleeding, nerve injury, cardiopulmonary complications, the need for revision surgery, among others, and the patient verbalized understanding and consented to proceed.  OPERATIVE IMPLANTS: None  OPERATIVE PROCEDURE:  After receiving prophylactic antibiotics, the patient was escorted to the operative theatre and placed in a supine position.   A surgical "time-out" was performed during which the planned procedure, proposed operative site, and the correct patient identity were compared to the operative consent and agreement confirmed by the circulating nurse according to current facility policy. The incision site was marked and anesthetized with a mixture of lidocaine and Marcaine bearing epinephrine. He was sedated by anesthesia. Following application of a tourniquet to the operative extremity, the exposed skin was prepped with Chloraprep and draped in the usual sterile fashion.  The limb was exsanguinated with an Esmarch bandage and the tourniquet inflated to approximately 100mmHg higher than systolic BP.  A curvilinear incision was made coursing just distal and parallel to the distal palmar crease over the long finger ray. The skin was incised sharply with scalpel, subcutaneous tissues dissected with blunt and spreading dissection. The radial and ulnar  neurovascular bundles were retracted and spreading dissection performed with a hemostat to reveal the A1 pulley. The distal subcutaneous flap was elevated and the A1 pulley was incised in its midline. Additionally, the exposure caused some degree of cordotomy of the forming Dupuytren's disease along this ray. The patient was asked to flex and extend the digit fully and there was no more catching or locking, no triggering could be palpated. The tendons were pulled into view, with thickened tenosynovium was encountered in the region of pathology and there was some fraying of the tendon which was debrided. The tendons were returned to her bed, the wound irrigated, and the tourniquet released. The skin was closed with 4-0 Vicryl Rapide interrupted sutures and a light dressing was applied. He was taken to the recovery room.  DISPOSITION: He will be discharged home today with typical instructions returning in 10-15 days.

## 2013-10-12 NOTE — Transfer of Care (Signed)
Immediate Anesthesia Transfer of Care Note  Patient: Ronald SquibbJames S Cressy  Procedure(s) Performed: Procedure(s): LEFT LONG FINGER TRIGGER RELEASE (Left)  Patient Location: PACU  Anesthesia Type:MAC  Level of Consciousness: awake  Airway & Oxygen Therapy: Patient Spontanous Breathing and Patient connected to face mask oxygen  Post-op Assessment: Report given to PACU RN and Post -op Vital signs reviewed and stable  Post vital signs: Reviewed and stable  Complications: No apparent anesthesia complications

## 2013-10-12 NOTE — Interval H&P Note (Signed)
History and Physical Interval Note:  10/12/2013 9:03 AM  Ronald [name]  has presented today for surgery, with the diagnosis of LEFT LONG FINGER TRIGGER FINGER  The various methods of treatment have been discussed with the patient and family. After consideration of risks, benefits and other options for treatment, the patient has consented to  Procedure(s): LEFT LONG FINGER TRIGGER RELEASE (Left) as a surgical intervention .  The patient's history has been reviewed, patient examined, no change in status, stable for surgery.  I have reviewed the patient's chart and labs.  Questions were answered to the patient's satisfaction.     Raea Magallon A.

## 2013-10-12 NOTE — Anesthesia Preprocedure Evaluation (Signed)
Anesthesia Evaluation  Patient identified by MRN, date of birth, ID band Patient awake    Reviewed: Allergy & Precautions, H&P , NPO status , Patient's Chart, lab work & pertinent test results  Airway Mallampati: II  Neck ROM: full    Dental   Pulmonary former smoker,          Cardiovascular hypertension, + CAD, + Cardiac Stents and + Peripheral Vascular Disease     Neuro/Psych    GI/Hepatic   Endo/Other  obese  Renal/GU      Musculoskeletal   Abdominal   Peds  Hematology   Anesthesia Other Findings   Reproductive/Obstetrics                           Anesthesia Physical Anesthesia Plan  ASA: III  Anesthesia Plan: MAC   Post-op Pain Management:    Induction: Intravenous  Airway Management Planned: Simple Face Mask  Additional Equipment:   Intra-op Plan:   Post-operative Plan:   Informed Consent: I have reviewed the patients History and Physical, chart, labs and discussed the procedure including the risks, benefits and alternatives for the proposed anesthesia with the patient or authorized representative who has indicated his/her understanding and acceptance.     Plan Discussed with: CRNA, Anesthesiologist and Surgeon  Anesthesia Plan Comments:         Anesthesia Quick Evaluation

## 2013-10-12 NOTE — Discharge Instructions (Addendum)
Discharge Instructions   You have a light dressing on your hand.  You may begin gentle FULL motion of your fingers and hand immediately, but you should not do any heavy lifting or gripping.  Elevate your hand to reduce pain & swelling of the digits.  Ice over the operative site may be helpful to reduce pain & swelling.  DO NOT USE HEAT. Pain medicine has been prescribed for you.  Use your medicine as needed over the first 48 hours, and then you can begin to taper your use. You may use Tylenol in place of your prescribed pain medication, but not IN ADDITION to it. Leave the dressing in place until the third day after your surgery and then remove it, leaving it open to air.  After the bandage has been removed you may shower, but do not soak the incision.  You may drive a car when you are off of prescription pain medications and can safely control your vehicle with both hands. We will address whether therapy will be required or not when you return to the office. You may have already made your follow-up appointment when we completed your preop visit.  If not, please call our office today or the next business day to make your return appointment for 10-15 days after surgery.   Please call [phone] during normal business hours or [phone] after hours for any problems. Including the following:  - excessive redness of the incisions - drainage for more than 4 days - fever of more than 101.5 F  *Please note that pain medications will not be refilled after hours or on weekends.   Post Anesthesia Home Care Instructions  Activity: Get plenty of rest for the remainder of the day. A responsible adult should stay with you for 24 hours following the procedure.  For the next 24 hours, DO NOT: -Drive a car -Advertising copywriterperate machinery -Drink alcoholic beverages -Take any medication unless instructed by your physician -Make any legal decisions or sign important papers.  Meals: Start with liquid foods  such as gelatin or soup. Progress to regular foods as tolerated. Avoid greasy, spicy, heavy foods. If nausea and/or vomiting occur, drink only clear liquids until the nausea and/or vomiting subsides. Call your physician if vomiting continues.  Special Instructions/Symptoms: Your throat may feel dry or sore from the anesthesia or the breathing tube placed in your throat during surgery. If this causes discomfort, gargle with warm salt water. The discomfort should disappear within 24 hours.

## 2013-10-12 NOTE — Anesthesia Postprocedure Evaluation (Signed)
Anesthesia Post Note  Patient: Ronald [name]  Procedure(s) Performed: Procedure(s) (LRB): LEFT LONG FINGER TRIGGER RELEASE (Left)  Anesthesia type: MAC  Patient location: PACU  Post pain: Pain level controlled and Adequate analgesia  Post assessment: Post-op Vital signs reviewed, Patient's Cardiovascular Status Stable and Respiratory Function Stable  Last Vitals:  Filed Vitals:   10/12/13 1025  BP: 117/80  Pulse: 51  Temp: 36.4 C  Resp: 18    Post vital signs: Reviewed and stable  Level of consciousness: awake, alert  and oriented  Complications: No apparent anesthesia complications

## 2013-10-13 ENCOUNTER — Encounter (HOSPITAL_BASED_OUTPATIENT_CLINIC_OR_DEPARTMENT_OTHER): Payer: Self-pay | Admitting: Orthopedic Surgery

## 2013-10-26 ENCOUNTER — Other Ambulatory Visit: Payer: Self-pay | Admitting: *Deleted

## 2013-10-26 DIAGNOSIS — I712 Thoracic aortic aneurysm, without rupture, unspecified: Secondary | ICD-10-CM

## 2013-11-17 ENCOUNTER — Encounter: Payer: Self-pay | Admitting: Cardiovascular Disease

## 2013-11-17 ENCOUNTER — Ambulatory Visit (INDEPENDENT_AMBULATORY_CARE_PROVIDER_SITE_OTHER): Payer: Medicare PPO | Admitting: Cardiovascular Disease

## 2013-11-17 VITALS — BP 131/84 | HR 68 | Ht 79.0 in | Wt 268.0 lb

## 2013-11-17 DIAGNOSIS — I2510 Atherosclerotic heart disease of native coronary artery without angina pectoris: Secondary | ICD-10-CM

## 2013-11-17 DIAGNOSIS — E785 Hyperlipidemia, unspecified: Secondary | ICD-10-CM

## 2013-11-17 DIAGNOSIS — I712 Thoracic aortic aneurysm, without rupture, unspecified: Secondary | ICD-10-CM

## 2013-11-17 DIAGNOSIS — I10 Essential (primary) hypertension: Secondary | ICD-10-CM

## 2013-11-17 NOTE — Progress Notes (Signed)
History of Present Illness:72 yo WM with h/o CAD s/p stent RCA 2004, HTN, HLD, dilated aortic root (Ascending aortic aneurysm) here today for cardiac follow up. He has been followed in the past by Dr. Deborah Chalkennant. Echo August 2012 with LVEF 55-60%. CTA chest February 2013 with questionable enlargement of thoracic aorta. He was seen by Dr. Tyrone SageGerhardt and he felt the aorta size was stable. He is followed yearly by Dr. Tyrone SageGerhardt with yearly CTA chest. Last Chest CTA August 2014 with maximal diameter of ascending aorta 4.6 cm. He has not tolerated statins due to muscle aches. This is followed in primary care. No beta blocker secondary to bradycardia.   He tells me today that he has had no exertional chest pain, SOB, dizziness, near syncope or syncope. He has been active. He has had finger surgery and shoulder surgery since his last visit here.   Primary Care Physician: Rodrigo RanMark Perini   Last Lipid Profile: Followed in primary care  Past Medical History  Diagnosis Date  . Hypertension   . Hypercholesteremia     managed by dr Waynard Edwardsperini  . 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   . Thoracic aortic aneurysm     last scan in Jan 2013. Now measuring 5.0cm. Referred to Dr. Tyrone SageGerhardt  . Malaria   . SVT (supraventricular tachycardia)     remote episode during exercise test  . Lung nodules     noted on past CT scans  . Hx of Bell's palsy     left face  . Wears glasses   . Wears dentures     bottom    Past Surgical History  Procedure Laterality Date  . Cervical fusion  03-2010    c2-c7  . Rotator cuff surgery  1997    lt  . Elbow surgery  1997    lt and rt  . Retinal detachment surgery  1975    lt  . Coronary stent placement  2004  . Tonsillectomy    . Eye surgery    . Colonoscopy      x4  . Shoulder arthroscopy  2/15    right-got cardiac clearance-gsc  . Trigger finger release Left 10/12/2013   Procedure: LEFT LONG FINGER TRIGGER RELEASE;  Surgeon: Jodi Marbleavid A Thompson, MD;  Location: [facility];  Service: Orthopedics;  Laterality: Left;    Current Outpatient Prescriptions  Medication Sig Dispense Refill  . aspirin 325 MG tablet Take 325 mg by mouth daily.        . Coenzyme Q10 (COQ-10) 200 MG CAPS Take 1 capsule by mouth daily.        . colesevelam (WELCHOL) 625 MG tablet Take 1,875 mg by mouth 2 (two) times daily with a meal.        . ezetimibe (ZETIA) 10 MG tablet Take 5 mg by mouth daily.       . fluticasone (FLONASE) 50 MCG/ACT nasal spray Place 1 spray into the nose daily. As needed      . Glucosamine-Chondroit-Vit C-Mn (GLUCOSAMINE CHONDR 1500 COMPLX) CAPS Take 2 capsules by mouth daily. With Vit. D      . HYDROcodone-acetaminophen (NORCO) 5-325 MG per tablet Take 1-2 tablets by mouth every 4 (four) hours as needed.  40 tablet  0  . lisinopril (PRINIVIL,ZESTRIL) 40 MG tablet Take 20 mg by mouth daily.        .Marland Kitchen  Omega-3 Fatty Acids (FISH OIL) 1000 MG CAPS Take 6 capsules by mouth daily.        Marland Kitchen omeprazole (PRILOSEC) 20 MG capsule Take 20 mg by mouth daily.        . Pitavastatin Calcium (LIVALO) 2 MG TABS Take 2 mg by mouth every morning.        No current facility-administered medications for this visit.    Allergies  Allergen Reactions  . Vesicare [Solifenacin Succinate] Itching and Rash  . Statins Other (See Comments)    myalgias  . Promethazine Hcl     Agitation and incoherence    History   Social History  . Marital Status: Married    Spouse Name: N/A    Number of Children: 1  . Years of Education: N/A   Occupational History  . retired Engineer, site    Social History Main Topics  . Smoking status: Former Smoker    Quit date: 04/09/1969  . Smokeless tobacco: Former Neurosurgeon    Types: Chew    Quit date: 03/09/2010  . Alcohol Use: 0.5 oz/week    1 drink(s) per week     Comment: occasional  . Drug Use: No  . Sexual Activity: Yes   Other  Topics Concern  . Not on file   Social History Narrative  . No narrative on file    Family History  Problem Relation Age of Onset  . Heart disease Father     Review of Systems:  As stated in the HPI and otherwise negative.   BP 131/84  Pulse 68  Ht 6\' 7"  (2.007 m)  Wt 268 lb (121.564 kg)  BMI 30.18 kg/m2  Physical Examination: General: Well developed, well nourished, NAD HEENT: OP clear, mucus membranes moist SKIN: warm, dry. No rashes. Neuro: No focal deficits Musculoskeletal: Muscle strength 5/5 all ext Psychiatric: Mood and affect normal Neck: No JVD, no carotid bruits, no thyromegaly, no lymphadenopathy. Lungs:Clear bilaterally, no wheezes, rhonci, crackles Cardiovascular: Regular rate and rhythm. No murmurs, gallops or rubs. Abdomen:Soft. Bowel sounds present. Non-tender.  Extremities: No lower extremity edema. Pulses are 2 + in the bilateral DP/PT.  Cardiac cath 06/08/11: Left main: There are separate ostia for the LAD and Circumflex.  Left Anterior Descending Artery: Large vessel that courses to the apex. No obstructive disease noted.Small diagonal branch.  Circumflex Artery: Large caliber vessel with no obstructive disease noted.  Right Coronary Artery: Very large, dominant vessel with patent proximal stent. There is mild plaque disease in the mid and distal RCA. The small caliber PDA has 60% stenosis.  Left Ventricular Angiogram: Dilated LV. LVEF=50%.  Aortic root: Dilated aortic root and ascending aorta.  Impression:  1. 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 Preserved LV systolic function  3. 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   EKG: NSR, rate 65 bpm. Incomplete RBBB.   Assessment and Plan:   1. CAD: He has no angina. Last cath March 2013 with stable disease. Will continue ASA, statin. No beta blocker due to bradycardia.   2. Thoracic Aortic aneurysm: Followed by Dr. Tyrone Sage. No plans for surgical intervention as this has been stable  for years. No beta blocker at this time secondary to bradycardia. BP is well controlled. He will see Dr. Tyrone Sage this month and have CTA repeated then.   3. HTN: BP well controlled. No changes today.   4. HLD: Managed in primary care.

## 2013-11-17 NOTE — Patient Instructions (Signed)
Your physician recommends that you continue on your current medications as directed. Please refer to the Current Medication list given to you today.  Your physician wants you to follow-up in: 12 months with Dr. Clifton JamesMcAlhany. You will receive a reminder letter in the mail two months in advance. If you don't receive a letter, please call our office to schedule the follow-up appointment.

## 2013-12-02 ENCOUNTER — Other Ambulatory Visit: Payer: Self-pay | Admitting: *Deleted

## 2013-12-02 DIAGNOSIS — I712 Thoracic aortic aneurysm, without rupture, unspecified: Secondary | ICD-10-CM

## 2013-12-02 LAB — BUN: BUN: 16 mg/dL (ref 6–23)

## 2013-12-02 LAB — CREATININE, SERUM: Creat: 0.6 mg/dL (ref 0.50–1.35)

## 2013-12-03 ENCOUNTER — Encounter: Payer: Self-pay | Admitting: Cardiothoracic Surgery

## 2013-12-03 ENCOUNTER — Ambulatory Visit
Admission: RE | Admit: 2013-12-03 | Discharge: 2013-12-03 | Disposition: A | Discharge status: Home / Self Care | Payer: Medicare PPO | Source: Ambulatory Visit | Attending: Cardiothoracic Surgery | Admitting: Cardiothoracic Surgery

## 2013-12-03 ENCOUNTER — Ambulatory Visit (INDEPENDENT_AMBULATORY_CARE_PROVIDER_SITE_OTHER): Payer: Medicare PPO | Admitting: Cardiothoracic Surgery

## 2013-12-03 VITALS — BP 115/78 | HR 72 | Ht 79.0 in | Wt 268.0 lb

## 2013-12-03 DIAGNOSIS — I712 Thoracic aortic aneurysm, without rupture, unspecified: Secondary | ICD-10-CM

## 2013-12-03 DIAGNOSIS — R918 Other nonspecific abnormal finding of lung field: Secondary | ICD-10-CM

## 2013-12-03 MED ORDER — IOHEXOL 350 MG/ML SOLN
80.0000 mL | Freq: Once | INTRAVENOUS | Status: AC | PRN
Start: 2013-12-03 — End: 2013-12-03
  Administered 2013-12-03: 80 mL via INTRAVENOUS

## 2013-12-03 NOTE — Progress Notes (Signed)
[address]       [place] [address]             [phone]                                                       Ronald [name] [facility] Medical Record #[id number] Date of Birth: 05/29/1940  Referring: Kathleene Hazel* Primary Care: Ezequiel Kayser, MD  Chief Complaint:    Dilated aorta   History of Present Illness:    Patient is a 73 year old male with known coronary occlusive disease having a stents placed in his right coronary artery 7 to 8 years ago. He is also known to have a dilated ascending aorta this is been followed since 2004 with serial CT scans. He returns today for follow up CTA  of the chest. Scans dating back to 2004 had been very consistent without change at 4.5 cm. The patient has no history of aortic insufficiency has no signs or symptoms of congestive heart failure he has no angina. There is no family history of aortic aneurysm or aortic dissections. His father died at age 42 in 62 sudden death while at a picnic attributed to "cardiac spasm".   Current Activity/ Functional Status: Patient is independent with mobility/ambulation, transfers, ADL's, IADL's.   Past Medical History  Diagnosis Date  . Hypertension   . Hypercholesteremia     managed by dr Waynard Edwards  . 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   . Thoracic aortic aneurysm     last scan in Jan 2013. Now measuring 5.0cm. Referred to Dr. Tyrone Sage  . Malaria  patient had 3 episodes of malaria while serving in [place]   . SVT (supraventricular tachycardia)     remote episode during exercise test  . Lung nodules     noted on past CT scans    Past Surgical History  Procedure Laterality Date  . Cervical fusion  03-2010    c2-c7  . Rotator cuff surgery  1997    lt  . Elbow surgery  1997    lt and rt  . Retinal detachment surgery  1975    lt  . Coronary stent  placement  2004  . Tonsillectomy    . Eye surgery    . Colonoscopy      x4  . Shoulder arthroscopy  2/15    right-got cardiac clearance-gsc  . Trigger finger release Left 10/12/2013    Procedure: LEFT LONG FINGER TRIGGER RELEASE;  Surgeon: Jodi Marble, MD;  Location: [facility];  Service: Orthopedics;  Laterality: Left;    Family History  Problem Relation Age of Onset  . Heart disease Father     History   Social History  . Marital Status: Married    Spouse Name: N/A    Number of Children: 1  . Years of Education: N/A   Occupational History  . 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 Smoking status: Former Smoker    Quit date: 04/09/1969  . Smokeless tobacco: Former Neurosurgeon    Types: Sports administrator  Quit date: 03/09/2010  . Alcohol Use: 0.5 oz/week    1 drink(s) per week     occasional  . Drug Use: No  . Sexually Active: Yes    Social History Narrative  .  patient's mother is currently undergoing treatment for metastatic terminal lung cancer     History  Smoking status  . Former Smoker  . Quit date: 04/09/1969  Smokeless tobacco  . Former Neurosurgeon  . Types: Chew  . Quit date: 03/09/2010    History  Alcohol Use  . 0.5 oz/week  . 1 drink(s) per week    Comment: occasional     Allergies  Allergen Reactions  . Vesicare [Solifenacin Succinate] Itching and Rash  . Statins Other (See Comments)    myalgias  . Promethazine Hcl     Agitation and incoherence    Current Outpatient Prescriptions  Medication Sig Dispense Refill  . aspirin 325 MG tablet Take 325 mg by mouth daily.        . Coenzyme Q10 (COQ-10) 200 MG CAPS Take 1 capsule by mouth daily.        . colesevelam (WELCHOL) 625 MG tablet Take by mouth 2 (two) times daily with a meal. All tabs taken  At same time(6)      . ezetimibe (ZETIA) 10 MG tablet Take 5 mg by mouth daily.       . fluticasone (FLONASE) 50 MCG/ACT nasal spray  Place 1 spray into the nose daily. As needed      . Glucosamine-Chondroit-Vit C-Mn (GLUCOSAMINE CHONDR 1500 COMPLX) CAPS Take 2 capsules by mouth daily. With Vit. D      . lisinopril (PRINIVIL,ZESTRIL) 40 MG tablet Take 20 mg by mouth daily.        . Omega-3 Fatty Acids (FISH OIL) 1000 MG CAPS Take 6 capsules by mouth daily.        Marland Kitchen omeprazole (PRILOSEC) 20 MG capsule Take 20 mg by mouth daily.        . Pitavastatin Calcium (LIVALO) 2 MG TABS Take 2 mg by mouth every morning.       Marland Kitchen HYDROcodone-acetaminophen (NORCO) 5-325 MG per tablet Take 1-2 tablets by mouth every 4 (four) hours as needed.  40 tablet  0   No current facility-administered medications for this visit.       Review of Systems:     Cardiac Review of Systems: Y or N  Chest Pain [  n  ]  Resting SOB [ n  ] Exertional SOB  [ n ]  Orthopnea [n  ]   Pedal Edema [  mild ]    Palpitations [ n ] Syncope  [ n ]   Presyncope [ n  ]  General Review of Systems: [Y] = yes [  ]=no Constitional: recent weight change [  ]; anorexia [  ]; fatigue [  ]; nausea [  ]; night sweats [  ]; fever [  ]; or chills [  ];  Dental: poor dentition[ y caps]; Last Dentist visit: last 6 months  Eye : blurred vision [  ]; diplopia [   ]; vision changes [  ];  Amaurosis fugax[  ]; Resp: cough [  ];  wheezing[  ];  hemoptysis[  ]; shortness of breath[  ]; paroxysmal nocturnal dyspnea[  ]; dyspnea on exertion[  ]; or orthopnea[  ];  GI:  gallstones[  ], vomiting[  ];  dysphagia[  ]; melena[  ];  hematochezia [  ]; heartburn[  ];   Hx of  Colonoscopy[y  ]; GU: kidney stones [  ]; hematuria[  ];   dysuria [ y ];  nocturia[  y];  history of     obstruction [ n ];             Skin: rash, swelling[  ];, hair loss[  ];  peripheral edema[  ];  or itching[  ]; Musculosketetal: myalgias[  ];  joint swelling[  ];  joint erythema[  ];   joint pain[  ];  back pain[  ];  Heme/Lymph: bruising[  ];  bleeding[  ];  anemia[  ];  Neuro: TIA[  ];  headaches[  ];  stroke[  ];  vertigo[  ];  seizures[  ];   paresthesias[  ];  difficulty walking[  ];  Psych:depression[  ]; anxiety[  ];  Endocrine: diabetes[ n ];  thyroid dysfunction[ n ];  Immunizations: Flu [ y ]; Pneumococcal[y  ]; and shingles  Other:  Physical Exam: BP 115/78  Pulse 72  Ht 6\' 7"  (2.007 m)  Wt 268 lb (121.564 kg)  BMI 30.18 kg/m2  SpO2 96%  General appearance: alert, cooperative, appears stated age and no distress Neurologic: intact Heart: regular rate and rhythm, S1, S2 normal, no murmur, click, rub or gallop Lungs: clear to auscultation bilaterally Abdomen: soft, non-tender; bowel sounds normal; no masses,  no organomegaly Extremities: extremities normal, atraumatic, no cyanosis or edema, Homans sign is negative, no sign of DVT and varicose veins noted no adenopathy cervical or axillary Patient has large body stature with a body surface area of 2.6 however he has no features of Marfan's,  Diagnostic Studies & Laboratory data:     Recent Radiology Findings: C  12/03/2013   CLINICAL DATA:  Thoracic aortic aneurysm.  EXAM: CT ANGIOGRAPHY CHEST WITH CONTRAST  TECHNIQUE: Multidetector CT imaging of the chest was performed using the standard protocol during bolus administration of intravenous contrast. Multiplanar CT image reconstructions and MIPs were obtained to evaluate the vascular anatomy.  CONTRAST:  80mL OMNIPAQUE IOHEXOL 350 MG/ML SOLN  COMPARISON:  CT scan of November 13, 2012 and December 26, 2011.  FINDINGS: No pneumothorax or pleural effusion is noted. Stable 4.5 mm nodule is noted in left lingula laterally which is unchanged compared with 2013 and can be considered benign at this point. 4.8 cm nodule is noted in the medial portion of the superior segment of the right lower lobe best seen on image number 26 of series 5. This is unchanged compared to prior  exam. No acute pulmonary disease is noted. Coronary artery calcifications are again noted. No significant mediastinal mass or adenopathy is noted. Ascending thoracic aorta has maximum measured diameter 4.6 cm which is not changed compared to prior exam. No dissection or significant atheromatous disease is noted. Normal caliber of descending thoracic aorta is noted. Visualized portion of upper abdomen appears normal. The great vessels appear to be widely patent without significant stenosis. No significant osseous  abnormality is noted.  Review of the MIP images confirms the above findings.  IMPRESSION: 4.6 cm ascending thoracic aortic aneurysm is noted which is unchanged compared to prior exam.  Coronary artery calcifications are noted consistent with coronary artery disease.  Stable stable 4.8 cm nodule seen in superior segment of right lower lobe ; followup chest CT in 12 months is recommended to ensure stability.   Electronically Signed   By: Roque Lias M.D.   On: 12/03/2013 14:47   I have discussed with radiology and reviewed the films, should be 4.8 mm not cm nodule  Ct Angio Chest Aorta W/cm &/or Wo/cm  11/13/2012   *RADIOLOGY REPORT*  Clinical Data: Aortic aneurysm  CT ANGIOGRAPHY CHEST  Technique:  Multidetector CT imaging of the chest using the standard protocol during bolus administration of intravenous contrast. Multiplanar reconstructed images including MIPs were obtained and reviewed to evaluate the vascular anatomy.  Contrast: OMNIPAQUE IOHEXOL 350 MG/ML SOLN  Comparison: 12/26/2011  Findings: Aortic diameter at the sinus of Valsalva, sinotubular junction, and ascending aorta are 5.0 cm, 3.7 cm, and 4.6 cm respectively.  No evidence of aortic dissection or transection. Little if any plaque within the thoracic aorta.  Innominate artery, right vertebral artery, right subclavian artery, right common carotid artery, left common carotid artery, left subclavian artery, and left vertebral artery are  all widely patent within the confines of the examination.  Right coronary artery stent is noted.  Calcification in the circumflex and left anterior descending coronary vessels are noted. Distal right coronary artery calcifications are noted.  No obvious filling defects in the pulmonary arterial tree to suggest acute pulmonary thromboembolism.  Negative abnormal mediastinal adenopathy.  No pericardial effusion.  Sub centimeter right thyroid hypodensity on image 17 of series 4.  No pneumothorax.  No pleural effusion.  Calcified lymph nodes in the mediastinum or are noted.  Sub centimeter pulmonary nodules are stable.  Cervical spine fusion hardware is in place.  The right-sided screw within the C7 vertebral body has retracted and is within the prevertebral soft tissues.  Images of the upper abdomen demonstrate diffuse hepatic steatosis. Prominence of the adrenal glands without focal mass is stable.  IMPRESSION: Maximal diameter of the ascending aorta is 4.6 cm.  Previously, it was measured at 4.9 cm.  It is therefore not enlarging.  No evidence of aortic dissection.  Sub centimeter right thyroid hypodensity is stable in retrospect compared with a study from 2012.  This supports benign etiology.  Cervical spine fusion hardware is in place.  One of the screws in C7 has retracted and is residing within the prevertebral soft tissues.   Original Report Authenticated By: Jolaine Click, M.D.    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  Follow-up study.  CT ANGIOGRAPHY OF THE HEART, CORONARY ARTERY, STRUCTURE, AND MORPHOLOGY  CONTRAST:  100 ml of Omnipaque-300.  COMPARISON:  CTA chest 05/14/2011.  TECHNIQUE:  CT angiography of the coronary vessels was performed on a 256 channel system using prospective ECG gating.  A scout and noncontrast exam (for calcium scoring) were performed.   Circulation time was measured using a test bolus.  Coronary CTA was performed with sub mm slice collimation during portions of the cardiac cycle after prior injection of iodinated contrast.  Imaging post processing was performed on an independent workstation creating multiplanar and 3-D images, and quantitative  analysis of the heart and coronary arteries.  Note that this exam targets the heart and the chest was not imaged in its entirety.  PREMEDICATION: Lopressor 5 mg, IV Nitroglycerin 400 mcg, sublingual.  FINDINGS: Technical quality:  Good  Heart rate:  55-60  CORONARY ARTERIES: Left main coronary artery:  Not present (there are separate ostia for the left anterior descending and left circumflex coronary arteries directly off the left sinus of Valsalva). Left anterior descending:  Mildly diseased with mixed calcified noncalcified atherosclerotic plaque with areas of stenosis up to 25 - 50% in the mid LAD. Left circumflex:  Large caliber vessel that quickly gives rise to two large obtuse marginal vessels.  Proximal left circumflex demonstrates only minimal calcified plaque but no associated stenosis. Obtuse Marginal 1: Mildly diseased with calcified plaque with no associated luminal stenosis. Obtuse Marginal 2: Extensive calcified atherosclerotic plaque proximally with stenosis >50% (possibly >75%), however, secondary to the high degree of calcification, accurate assessment for luminal stenosis is not possible on this examination. Right coronary artery:  There is a stent in the proximal right coronary artery which is grossly patent.  There is some calcified plaque at the distal end of the stent where the vessel is slightly acutely angulated.  There appears to be some stenosis in this region, however, it is favored to be approximately 25 - 50% (accurate assessment is limited by the high-density the stent material and the heavily calcified plaque).  The remainder of the RCA is otherwise mildly diseased with  nonobstructive plaque. Posterior descending artery:  Patent with a mixed calcified noncalcified plaque.  Secondary to the small size of the vessel and excessive image noise, accurate assessment for stenosis is not possible on this examination, however, there appears to be at least 25 - 50% stenosis of the mid PDA. Dominance:  Right  CORONARY CALCIUM: Total Agatston Score:  655 MESA database percentile:  80th Comment - it appears as though the patient has moved or breathed during image acquisition, such that the a portion of the coronary arteries was incompletely visualized.  This likely underestimates the patient's true calcium score.  AORTA AND PULMONARY MEASUREMENTS: Aortic root (21 - 40 mm):             29 mm  at the annulus             49 mm  at the sinuses of Valsalva         36 mm  at the sinotubular junction Ascending aorta ( <  40 mm):  50 mm Aortic arch : 34 mm Descending aorta:  28 mm Main pulmonary artery:  ( <  30 mm):  35 mm  EXTRACARDIAC FINDINGS: Several tiny pulmonary nodules measuring 4 mm are scattered throughout the lungs bilaterally and are completely unchanged compared to prior examinations (dating back to 2007); these can be considered radiographically benign requiring no further imaging follow-up.  Small calcified granulomas are also noted in the posterior aspect of the left upper lobe.  Multiple calcified left hilar and mediastinal lymph nodes are noted.  No acute consolidative airspace disease.  No pleural effusions.  Visualized portions of the upper abdomen are unremarkable.  IMPRESSION:  1. Ascending thoracic aortic aneurysm is similar in size measuring 4.9 cm on today's examination.  No evidence of thoracic aortic dissection at this time. 2.  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  The patient's aortic valve appears to be tricuspid.  Additionally, there is no effacement of the sinotubular junction. 3.  Multivessel coronary  artery disease, as detailed above.  Most importantly, there is a heavily calcified plaque in the obtuse marginal 2 branch (a large vessel) which appears to be stenotic (i.e., at least 50% diameter stenosis, and possbily in excess of 75%), however, accurate assessment for percentage stenosis is limited on this examination secondary to the high degree of calcification. Clinical correlation is recommended, with consideration for further evaluation with stress testing if there is clinical concern for inducible ischemia. 4.  Coronary artery calcium scoring is at least 655 (the patient moved during the image acquisition, resulting in incomplete coverage of the coronary arteries which likely underestimates the true calcium score), which is 80th percentile for patient's of matched age, gender and race/ethnicity. 5.  Right coronary artery dominance.   Original Report Authenticated By: Florencia Reasons, M.D.     Recent Lab Findings: Lab Results  Component Value Date   WBC 11.1* 06/06/2011   HGB 16.0 10/12/2013   HCT 47.0 10/12/2013   PLT 267.0 06/06/2011   GLUCOSE 111* 10/12/2013   CHOL 184 11/14/2010   TRIG 143.0 11/14/2010   HDL 48.60 11/14/2010   LDLCALC 107* 11/14/2010   ALT 23 11/14/2010   AST 24 11/14/2010   NA 143 10/12/2013   K 3.9 10/12/2013   CL 107 10/12/2013   CREATININE 0.60 12/02/2013   BUN 16 12/02/2013   CO2 26 11/26/2011   INR 1.0 06/06/2011    Procedure Performed:  1. Left Heart Catheterization 2. Selective Coronary Angiography 3. Left ventricular angiogram 4. Aortic root angiogram Operator: Verne Carrow, MD  Indication: Pt with known CAD and thoracic aortic aneurysm. The aortic aneurysm has enlarged over the last year and we recently referred Mr. Balling to see Dr. Tyrone Sage. Plans are being made for aortic root replacement. Cardiac cath today to exclude progression of CAD.  Procedure Details:  The risks, benefits, complications, treatment options, and expected outcomes were discussed with the  patient. The patient and/or family concurred with the proposed plan, giving informed consent. The patient was brought to the cath lab after IV hydration was begun and oral premedication was given. The patient was further sedated with Versed and Fentanyl. The right groin was prepped and draped in the usual manner. Using the modified Seldinger access technique, a 4 French sheath was placed in the right femoral artery. A JL-5 catheter was used to engage the LAD with sub-selective shots of the Circumflex. An AL-1 catheter was used to non-selectively inject the Circumflex artery. A 3DRC catheter was used to engage and inject the RCA. A pigtail catheter was used to perform a left ventricular angiogram. The catheter was pulled back across the aortic valve. An aortic root angiogram was performed.  There were no immediate complications. The patient was taken to the recovery area in stable condition.  Hemodynamic Findings:  Central aortic pressure: 130/72  Left ventricular pressure: 130/17/24  Angiographic Findings:  Left main: There are separate ostia for the LAD and Circumflex.  Left Anterior Descending Artery: Large vessel that courses to the apex. No obstructive disease noted.Small diagonal branch.  Circumflex Artery: Large caliber vessel with no obstructive disease noted.  Right Coronary Artery: Very large, dominant vessel with patent proximal stent. There is mild plaque disease in the mid and distal RCA. The small caliber PDA has 60% stenosis.  Left Ventricular Angiogram: Dilated LV. LVEF=50%.  Aortic root: Dilated aortic root and ascending aorta.  Impression:  1. Single  vessel CAD with patent stent RCA  2. Preserved LV systolic function  3. 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     Aortic Size Index=     4.6    /Body surface area is 2.60 meters squared. = 1.8  < 2.75 cm/m2      4% risk per year 2.75 to 4.25          8% risk per year > 4.25 cm/m2    20% risk per year  cross  sectional area of aorta cm2/height in meters > 10 consider  surgery 16.6/2= 8.3    Assessment / Plan:    Known dilated ascending aorta since at least 2004,   The symptoms,  risks and lethal nature of acute aortic dissection was explained to him and his wife. CT scan reviewed with patient with current size recommend continued obervation No beta blocker per cardiology    I plan to see him back in 12  months with repeat cta to follow both the size of the aorta and the small pulmonary nodules    Delight Ovens MD  Beeper [phone] Office [phone] 12/03/2013 4:19 PM

## 2014-04-29 ENCOUNTER — Encounter (HOSPITAL_COMMUNITY): Payer: Self-pay | Admitting: Emergency Medicine

## 2014-04-29 ENCOUNTER — Emergency Department (HOSPITAL_COMMUNITY): Payer: Medicare PPO

## 2014-04-29 ENCOUNTER — Emergency Department (HOSPITAL_COMMUNITY)
Admission: EM | Admit: 2014-04-29 | Discharge: 2014-04-30 | Disposition: A | Discharge status: Home / Self Care | Payer: Medicare PPO | Attending: Emergency Medicine | Admitting: Emergency Medicine

## 2014-04-29 DIAGNOSIS — M25551 Pain in right hip: Secondary | ICD-10-CM

## 2014-04-29 DIAGNOSIS — Y9389 Activity, other specified: Secondary | ICD-10-CM | POA: Diagnosis not present

## 2014-04-29 DIAGNOSIS — Y9289 Other specified places as the place of occurrence of the external cause: Secondary | ICD-10-CM | POA: Insufficient documentation

## 2014-04-29 DIAGNOSIS — Z8619 Personal history of other infectious and parasitic diseases: Secondary | ICD-10-CM | POA: Diagnosis not present

## 2014-04-29 DIAGNOSIS — Z8669 Personal history of other diseases of the nervous system and sense organs: Secondary | ICD-10-CM | POA: Diagnosis not present

## 2014-04-29 DIAGNOSIS — W11XXXA Fall on and from ladder, initial encounter: Secondary | ICD-10-CM | POA: Diagnosis not present

## 2014-04-29 DIAGNOSIS — Z955 Presence of coronary angioplasty implant and graft: Secondary | ICD-10-CM | POA: Insufficient documentation

## 2014-04-29 DIAGNOSIS — Y998 Other external cause status: Secondary | ICD-10-CM | POA: Insufficient documentation

## 2014-04-29 DIAGNOSIS — Z8673 Personal history of transient ischemic attack (TIA), and cerebral infarction without residual deficits: Secondary | ICD-10-CM | POA: Diagnosis not present

## 2014-04-29 DIAGNOSIS — S79911A Unspecified injury of right hip, initial encounter: Secondary | ICD-10-CM | POA: Diagnosis not present

## 2014-04-29 DIAGNOSIS — Z7982 Long term (current) use of aspirin: Secondary | ICD-10-CM | POA: Diagnosis not present

## 2014-04-29 DIAGNOSIS — Z79899 Other long term (current) drug therapy: Secondary | ICD-10-CM | POA: Diagnosis not present

## 2014-04-29 DIAGNOSIS — W19XXXA Unspecified fall, initial encounter: Secondary | ICD-10-CM

## 2014-04-29 DIAGNOSIS — E780 Pure hypercholesterolemia: Secondary | ICD-10-CM | POA: Insufficient documentation

## 2014-04-29 DIAGNOSIS — Z87891 Personal history of nicotine dependence: Secondary | ICD-10-CM | POA: Insufficient documentation

## 2014-04-29 DIAGNOSIS — I10 Essential (primary) hypertension: Secondary | ICD-10-CM | POA: Diagnosis not present

## 2014-04-29 MED ORDER — HYDROCODONE-ACETAMINOPHEN 5-325 MG PO TABS
1.0000 | ORAL_TABLET | Freq: Once | ORAL | Status: AC
  Administered 2014-04-29: 2 via ORAL
  Filled 2014-04-29: qty 2

## 2014-04-29 NOTE — ED Provider Notes (Signed)
CSN: [id number]     Arrival date & time 04/29/14  2203 History  This chart was scribed for non-physician practitioner, Jinny Sanders, PA-C working with Loren Racer, MD by Gwenyth Ober, ED scribe. This patient was seen in room TR10C/TR10C and the patient's care was started at 11:16 PM   Chief Complaint  Patient presents with  . Fall   The history is provided by the patient. No language interpreter was used.   HPI Comments: Ronald [name] is a 74 y.o. male who presents to the Emergency Department complaining of constant, gradually worsening, moderate right hip pain that started 9 hours ago after he fell off of a ladder. He states he fell about 4 feet, from the third rung when he missed a step climbing down and landed on his right hip. Pt reports pain is worse with bearing weight. He has tried rest and Aleve 8 hours ago with no relief to symptoms. Pt denies numbness, weakness and dizziness as associated symptoms.    Past Medical History  Diagnosis Date  . Hypertension   . Hypercholesteremia     managed by dr Waynard Edwards  . 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   . Thoracic aortic aneurysm     last scan in Jan 2013. Now measuring 5.0cm. Referred to Dr. Tyrone Sage  . Malaria   . SVT (supraventricular tachycardia)     remote episode during exercise test  . Lung nodules     noted on past CT scans  . Hx of Bell's palsy     left face  . Wears glasses   . Wears dentures     bottom   Past Surgical History  Procedure Laterality Date  . Cervical fusion  03-2010    c2-c7  . Rotator cuff surgery  1997    lt  . Elbow surgery  1997    lt and rt  . Retinal detachment surgery  1975    lt  . Coronary stent placement  2004  . Tonsillectomy    . Eye surgery    . Colonoscopy      x4  . Shoulder arthroscopy  2/15    right-got cardiac clearance-gsc  . Trigger finger release Left 10/12/2013     Procedure: LEFT LONG FINGER TRIGGER RELEASE;  Surgeon: Jodi Marble, MD;  Location: [facility];  Service: Orthopedics;  Laterality: Left;   Family History  Problem Relation Age of Onset  . Heart disease Father    History  Substance Use Topics  . Smoking status: Former Smoker    Quit date: 04/09/1969  . Smokeless tobacco: Former Neurosurgeon    Types: Chew    Quit date: 03/09/2010  . Alcohol Use: 0.5 oz/week    1 drink(s) per week     Comment: occasional    Review of Systems  Musculoskeletal: Positive for arthralgias.  Neurological: Negative for dizziness, weakness and numbness.   Allergies  Vesicare; Statins; and Promethazine hcl  Home Medications   Prior to Admission medications   Medication Sig Start Date End Date Taking? Authorizing Provider  aspirin 325 MG tablet Take 325 mg by mouth daily.      Historical Provider, MD  Coenzyme Q10 (COQ-10) 200 MG CAPS Take 1 capsule by mouth daily.      Historical Provider, MD  colesevelam (WELCHOL) 625 MG tablet Take by mouth 2 (  two) times daily with a meal. All tabs taken  At same time(6)    Historical Provider, MD  ezetimibe (ZETIA) 10 MG tablet Take 5 mg by mouth daily.     Historical Provider, MD  fluticasone (FLONASE) 50 MCG/ACT nasal spray Place 1 spray into the nose daily. As needed 12/20/11   Historical Provider, MD  Glucosamine-Chondroit-Vit C-Mn (GLUCOSAMINE CHONDR 1500 COMPLX) CAPS Take 2 capsules by mouth daily. With Vit. D    Historical Provider, MD  HYDROcodone-acetaminophen (NORCO/VICODIN) 5-325 MG per tablet Take 1-2 tablets by mouth every 6 (six) hours as needed for moderate pain or severe pain. 04/30/14   Monte FantasiaJoseph W Miciah Covelli, PA-C  lisinopril (PRINIVIL,ZESTRIL) 40 MG tablet Take 20 mg by mouth daily.      Historical Provider, MD  Omega-3 Fatty Acids (FISH OIL) 1000 MG CAPS Take 6 capsules by mouth daily.      Historical Provider, MD  omeprazole (PRILOSEC) 20 MG capsule Take 20 mg by mouth daily.      Historical  Provider, MD  Pitavastatin Calcium (LIVALO) 2 MG TABS Take 2 mg by mouth every morning.     Historical Provider, MD   BP 114/71 mmHg  Pulse 77  Temp(Src) 98.1 F (36.7 C) (Oral)  Resp 18  Ht 6\' 7"  (2.007 m)  Wt 275 lb (124.739 kg)  BMI 30.97 kg/m2  SpO2 95% Physical Exam  Constitutional: He appears well-developed and well-nourished. No distress.  HENT:  Head: Normocephalic and atraumatic.  Eyes: Conjunctivae and EOM are normal.  Neck: Neck supple. No tracheal deviation present.  Cardiovascular: Normal rate.   Pulmonary/Chest: Effort normal. No respiratory distress.  Musculoskeletal: Normal range of motion.  Right hip exam: No obvious erythema, deformity, edema, warmth, ecchymosis, signs of injury. Patient has limited range of motion to active range of motion due to pain, patient has full range of motion with passive range of motion. 5/5 motor strength of his hip, knee and ankle. DP pulse 2+. Distal sensation intact. Point tenderness noted in internal trochanteric region with circumduction, external/internal rotation of hip.  Neurological: He has normal strength. No cranial nerve deficit or sensory deficit. He displays a negative Romberg sign. Gait normal. GCS eye subscore is 4. GCS verbal subscore is 5. GCS motor subscore is 6.  Patient fully alert, answering questions appropriately in full, clear sentences. Cranial nerves II through XII grossly intact. Motor strength 5 out of 5 in all major muscle groups of upper and lower extremities. Distal sensation intact.  Skin: Skin is warm and dry.  Psychiatric: He has a normal mood and affect. His behavior is normal.  Nursing note and vitals reviewed.   ED Course  Procedures (including critical care time) DIAGNOSTIC STUDIES: Oxygen Saturation is 95% on RA, normal by my interpretation.    COORDINATION OF CARE: 11:36 PM Discussed x-ray results and treatment plan with pt. Pt agreed to plan.    Labs Review Labs Reviewed - No data to  display  Imaging Review Ct Hip Right Wo Contrast  04/30/2014   CLINICAL DATA:  Larey SeatFell off a ladder yesterday and landed on right leg. Twisted leg during fall. Patient states the fall did not hurt but rather the twisting motion. Right hip pain. Difficulty walking.  EXAM: CT OF THE RIGHT HIP WITHOUT CONTRAST  TECHNIQUE: Multidetector CT imaging of the right hip was performed according to the standard protocol. Multiplanar CT image reconstructions were also generated.  COMPARISON:  None.  FINDINGS: No fracture or bone lesion. There are degenerative  changes of the right hip with joint space narrowing, most prominent superior laterally, mild superior acetabular subchondral sclerosis and cystic change and marginal osteophytes from the base of the femoral head. No joint effusion. Surrounding musculature is unremarkable.  IMPRESSION: No fracture or acute finding. Degenerative changes of the right hip, chronic.   Electronically Signed   By: Amie Portland M.D.   On: 04/30/2014 01:38   Dg Hip Unilat With Pelvis 2-3 Views Right  04/29/2014   CLINICAL DATA:  Pt states that he was trying to change a lightbulb in his carport and missed a rung on the ladder as he was trying to come down. Pt states that he has right hip pain.  EXAM: DG HIP W/ PELVIS 2-3V*R*  COMPARISON:  None.  FINDINGS: No fracture.  No bone lesion.  Bilateral hip joint degenerative change with superior lateral joint space narrowing, mild subchondral sclerosis along the superior acetabula and marginal osteophytes from the bases of the femoral heads.  SI joints symphysis pubis are normally aligned.  Soft tissues are unremarkable.  IMPRESSION: No fracture or acute finding.  Bilateral hip osteoarthritis.   Electronically Signed   By: Amie Portland M.D.   On: 04/29/2014 23:12     EKG Interpretation None      MDM   Final diagnoses:  Right hip pain  Fall, initial encounter   Patient here complaining of right hip pain status post fall. Patient denying  loss of consciousness. Neuro exam benign. Patient has point tenderness to the trochanteric region of his right hip with range of motion of his leg. Patient neurovascularly intact.  Radiographs unremarkable for acute pathology, patient having pain out of proportion to his exam, we'll follow-up with CT hip.  CT hip with impression: No fracture or acute finding. Degenerative changes of the right hip, chronic.  Patient's pain treated in the ER, patient able to ambulate with mild difficulty due to pain. Patient hemodynamically stable, we'll discharge at this time, and have patient follow-up with his orthopedic surgeon at [facility]. I discussed RICE and conservative therapies with patient, discharged with prescription for pain medicine, discussed return precautions with patient, and patient verbalizes understanding and agreement of this plan. I encouraged patient to call or return to the ER should he have any questions or concerns.  I personally performed the services described in this documentation, which was scribed in my presence. The recorded information has been reviewed and is accurate.  BP 114/71 mmHg  Pulse 77  Temp(Src) 98.1 F (36.7 C) (Oral)  Resp 18  Ht  (2.007 m)  Wt 275 lb (124.739 kg)  BMI 30.97 kg/m2  SpO2 95%  Signed,  Ladona Mow, PA-C 2:00 AM  Patient seen and discussed with Dr. Loren Racer, M.D.   Monte Fantasia, PA-C 04/30/14 0200  Loren Racer, MD 04/30/14 [phone]

## 2014-04-29 NOTE — ED Notes (Signed)
Pt. missed his step and fell while going down the ladder this afternoon , no LOC / denies dizziness , alert and oriented , reports pain at right hip .

## 2014-04-30 ENCOUNTER — Emergency Department (HOSPITAL_COMMUNITY): Payer: Medicare PPO

## 2014-04-30 ENCOUNTER — Encounter (HOSPITAL_COMMUNITY): Payer: Self-pay

## 2014-04-30 MED ORDER — HYDROCODONE-ACETAMINOPHEN 5-325 MG PO TABS: 1.0000 | ORAL_TABLET | Freq: Four times a day (QID) | ORAL | Status: DC | PRN

## 2014-04-30 NOTE — Discharge Instructions (Signed)
Hip Pain Your hip is the joint between your upper legs and your lower pelvis. The bones, cartilage, tendons, and muscles of your hip joint perform a lot of work each day supporting your body weight and allowing you to move around. Hip pain can range from a minor ache to severe pain in one or both of your hips. Pain may be felt on the inside of the hip joint near the groin, or the outside near the buttocks and upper thigh. You may have swelling or stiffness as well.  HOME CARE INSTRUCTIONS   Take medicines only as directed by your health care provider.  Apply ice to the injured area:  Put ice in a plastic bag.  Place a towel between your skin and the bag.  Leave the ice on for 15-20 minutes at a time, 3-4 times a day.  Keep your leg raised (elevated) when possible to lessen swelling.  Avoid activities that cause pain.  Follow specific exercises as directed by your health care provider.  Sleep with a pillow between your legs on your most comfortable side.  Record how often you have hip pain, the location of the pain, and what it feels like. SEEK MEDICAL CARE IF:   You are unable to put weight on your leg.  Your hip is red or swollen or very tender to touch.  Your pain or swelling continues or worsens after 1 week.  You have increasing difficulty walking.  You have a fever. SEEK IMMEDIATE MEDICAL CARE IF:   You have fallen.  You have a sudden increase in pain and swelling in your hip. MAKE SURE YOU:   Understand these instructions.  Will watch your condition.  Will get help right away if you are not doing well or get worse. Document Released: 09/13/2009 Document Revised: 08/10/2013 Document Reviewed: 11/20/2012 [employer] Patient Information 2015 [employer]. This information is not intended to replace advice given to you by your health care provider. Make sure you discuss any questions you have with your health care provider.  

## 2014-04-30 NOTE — ED Notes (Signed)
Pt transported to CT ?

## 2014-05-07 DIAGNOSIS — M25551 Pain in right hip: Secondary | ICD-10-CM | POA: Diagnosis not present

## 2014-05-10 NOTE — Progress Notes (Signed)
Anesthesia Chart Review: SAME DAY WORK-UP. Case was just added on today, so Tresa EndoKelly from Dr. Greig RightMurphy's called and asked for anesthesia to review chart for any preoperative recommendations.  Patient is a 74 year old male scheduled for ORIF right femoral neck fracture on 05/11/14 by Dr. Renaye Rakersim Murphy. He fell off a ladder on 04/29/14.  History includes CAD s/p RCA stent '04, TAA and pulmonary nodules (followed by CT surgeon Dr. 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  PCP is Dr. Waynard EdwardsPerini. Primary cardiologist is Dr. Clifton JamesMcAlhany, last visit 11/17/13. Ascending TAA measured 4.6 cm by 12/03/13 CT and was felt stable. 12 month follow-up recommended.  11/17/13 EKG: NSR, incomplete right BBB. Borderline LAD.  06/08/11 cardiac cath:  Impression: 1. Single vessel CAD with patent stent RCA. 2. Preserved LV systolic function. LVEF 50%.  3. 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   11/27/10 Echo: - Left ventricle: The cavity size was normal. Wall thickness was increased in a pattern of moderate LVH. There was mild concentric hypertrophy. Systolic function was normal. The estimated ejection fraction was in the range of 55% to 60%. Wall motion was normal; there were no regional wall motion abnormalities. Left ventricular diastolic function parameters were normal. - Aortic valve: Trivial regurgitation. - Pulmonary arteries: Systolic pressure was mildly increased.  He is for labs on arrival.   I called and spoke with patient.  He denied chest pain, SOB, new edema, palpitations, syncope.  He denied any activity restrictions prior to his fall.  He feels at his baseline from a CV standpoint--denies any new symptoms since his last cardiology and CT surgery visits. Discussed with anesthesiologist Dr. Michelle Piperssey who agrees that if no acute change/new CV symptomology and if labs are acceptable then would plan to proceed. Further face to face evaluation tomorrow by  his anesthesiologist. I left a voice message with Tresa EndoKelly regarding above and also notified that patient is still on his ASA.  Velna Ochsllison Heaven Meeker, PA-C [facility]iology Phone [phone] 05/10/2014 2:58 PM

## 2014-05-11 ENCOUNTER — Ambulatory Visit (HOSPITAL_COMMUNITY)
Admission: RE | Admit: 2014-05-11 | Discharge: 2014-05-11 | Disposition: A | Discharge status: Home / Self Care | Payer: Medicare PPO | Source: Ambulatory Visit | Attending: Orthopedic Surgery | Admitting: Orthopedic Surgery

## 2014-05-11 ENCOUNTER — Ambulatory Visit (HOSPITAL_COMMUNITY): Payer: Medicare PPO | Admitting: Vascular Surgery

## 2014-05-11 ENCOUNTER — Encounter (HOSPITAL_COMMUNITY)
Admission: RE | Disposition: A | Discharge status: Home / Self Care | Payer: Self-pay | Source: Ambulatory Visit | Attending: Orthopedic Surgery

## 2014-05-11 ENCOUNTER — Encounter (HOSPITAL_COMMUNITY): Payer: Self-pay | Admitting: *Deleted

## 2014-05-11 ENCOUNTER — Ambulatory Visit (HOSPITAL_COMMUNITY): Payer: Medicare PPO

## 2014-05-11 DIAGNOSIS — S72001A Fracture of unspecified part of neck of right femur, initial encounter for closed fracture: Secondary | ICD-10-CM | POA: Insufficient documentation

## 2014-05-11 DIAGNOSIS — I712 Thoracic aortic aneurysm, without rupture: Secondary | ICD-10-CM | POA: Diagnosis not present

## 2014-05-11 DIAGNOSIS — Z9861 Coronary angioplasty status: Secondary | ICD-10-CM | POA: Diagnosis not present

## 2014-05-11 DIAGNOSIS — Z87891 Personal history of nicotine dependence: Secondary | ICD-10-CM | POA: Insufficient documentation

## 2014-05-11 DIAGNOSIS — I739 Peripheral vascular disease, unspecified: Secondary | ICD-10-CM | POA: Diagnosis not present

## 2014-05-11 DIAGNOSIS — K219 Gastro-esophageal reflux disease without esophagitis: Secondary | ICD-10-CM | POA: Insufficient documentation

## 2014-05-11 DIAGNOSIS — S72009A Fracture of unspecified part of neck of unspecified femur, initial encounter for closed fracture: Secondary | ICD-10-CM

## 2014-05-11 DIAGNOSIS — I2510 Atherosclerotic heart disease of native coronary artery without angina pectoris: Secondary | ICD-10-CM | POA: Diagnosis not present

## 2014-05-11 DIAGNOSIS — I10 Essential (primary) hypertension: Secondary | ICD-10-CM | POA: Diagnosis not present

## 2014-05-11 DIAGNOSIS — X58XXXA Exposure to other specified factors, initial encounter: Secondary | ICD-10-CM | POA: Insufficient documentation

## 2014-05-11 HISTORY — DX: Personal history of pneumonia (recurrent): Z87.01

## 2014-05-11 HISTORY — DX: Gastro-esophageal reflux disease without esophagitis: K21.9

## 2014-05-11 HISTORY — DX: Unspecified osteoarthritis, unspecified site: M19.90

## 2014-05-11 HISTORY — DX: Atherosclerotic heart disease of native coronary artery without angina pectoris: I25.10

## 2014-05-11 HISTORY — PX: HIP PINNING,CANNULATED: SHX1758

## 2014-05-11 LAB — CBC
HEMATOCRIT: 41.2 % (ref 39.0–52.0)
HEMOGLOBIN: 14 g/dL (ref 13.0–17.0)
MCH: 30.4 pg (ref 26.0–34.0)
MCHC: 34 g/dL (ref 30.0–36.0)
MCV: 89.4 fL (ref 78.0–100.0)
Platelets: 240 10*3/uL (ref 150–400)
RBC: 4.61 MIL/uL (ref 4.22–5.81)
RDW: 12.5 % (ref 11.5–15.5)
WBC: 5.9 10*3/uL (ref 4.0–10.5)

## 2014-05-11 LAB — BASIC METABOLIC PANEL
ANION GAP: 4 — AB (ref 5–15)
BUN: 22 mg/dL (ref 6–23)
CHLORIDE: 108 mmol/L (ref 96–112)
CO2: 26 mmol/L (ref 19–32)
CREATININE: 0.9 mg/dL (ref 0.50–1.35)
Calcium: 9.2 mg/dL (ref 8.4–10.5)
GFR calc Af Amer: >90 mL/min (ref >90)
GFR, EST NON AFRICAN AMERICAN: 84 mL/min — AB (ref >90)
Glucose, Bld: 105 mg/dL — ABNORMAL HIGH (ref 70–99)
Potassium: 4.3 mmol/L (ref 3.5–5.1)
Sodium: 138 mmol/L (ref 135–145)

## 2014-05-11 SURGERY — FIXATION, FEMUR, NECK, PERCUTANEOUS, USING SCREW
Anesthesia: General | Laterality: Right

## 2014-05-11 MED ORDER — PROPOFOL 10 MG/ML IV BOLUS
INTRAVENOUS | Status: DC | PRN
  Administered 2014-05-11: 160 mg via INTRAVENOUS

## 2014-05-11 MED ORDER — MIDAZOLAM HCL 2 MG/2ML IJ SOLN
INTRAMUSCULAR | Status: AC
  Filled 2014-05-11: qty 2

## 2014-05-11 MED ORDER — GLYCOPYRROLATE 0.2 MG/ML IJ SOLN
INTRAMUSCULAR | Status: AC
  Filled 2014-05-11: qty 2

## 2014-05-11 MED ORDER — CEFAZOLIN SODIUM 10 G IJ SOLR
3.0000 g | INTRAMUSCULAR | Status: AC
  Administered 2014-05-11: 3 g via INTRAVENOUS
  Filled 2014-05-11: qty 3000

## 2014-05-11 MED ORDER — NEOSTIGMINE METHYLSULFATE 10 MG/10ML IV SOLN
INTRAVENOUS | Status: DC | PRN
Start: 2014-05-11 — End: 2014-05-11
  Administered 2014-05-11: 4 mg via INTRAVENOUS

## 2014-05-11 MED ORDER — DEXTROSE-NACL 5-0.45 % IV SOLN: 100.0000 mL/h | INTRAVENOUS | Status: DC

## 2014-05-11 MED ORDER — HYDROMORPHONE HCL 1 MG/ML IJ SOLN
0.2500 mg | INTRAMUSCULAR | Status: DC | PRN
  Administered 2014-05-11 (×2): 0.5 mg via INTRAVENOUS

## 2014-05-11 MED ORDER — PHENYLEPHRINE HCL 10 MG/ML IJ SOLN
INTRAMUSCULAR | Status: DC | PRN
  Administered 2014-05-11 (×5): 80 ug via INTRAVENOUS

## 2014-05-11 MED ORDER — ONDANSETRON HCL 4 MG/2ML IJ SOLN
INTRAMUSCULAR | Status: DC | PRN
  Administered 2014-05-11: 4 mg via INTRAVENOUS

## 2014-05-11 MED ORDER — ROCURONIUM BROMIDE 100 MG/10ML IV SOLN
INTRAVENOUS | Status: DC | PRN
  Administered 2014-05-11: 50 mg via INTRAVENOUS

## 2014-05-11 MED ORDER — GLYCOPYRROLATE 0.2 MG/ML IJ SOLN
INTRAMUSCULAR | Status: DC | PRN
  Administered 2014-05-11: 0.6 mg via INTRAVENOUS

## 2014-05-11 MED ORDER — ACETAMINOPHEN 500 MG PO TABS
1000.0000 mg | ORAL_TABLET | Freq: Once | ORAL | Status: AC
  Administered 2014-05-11: 1000 mg via ORAL
  Filled 2014-05-11: qty 2

## 2014-05-11 MED ORDER — MUPIROCIN 2 % EX OINT
TOPICAL_OINTMENT | CUTANEOUS | Status: AC
  Filled 2014-05-11: qty 22

## 2014-05-11 MED ORDER — OXYCODONE-ACETAMINOPHEN 5-325 MG PO TABS: 2.0000 | ORAL_TABLET | ORAL | Status: DC | PRN

## 2014-05-11 MED ORDER — 0.9 % SODIUM CHLORIDE (POUR BTL) OPTIME
TOPICAL | Status: DC | PRN
  Administered 2014-05-11: 1000 mL

## 2014-05-11 MED ORDER — LIDOCAINE HCL (CARDIAC) 20 MG/ML IV SOLN
INTRAVENOUS | Status: AC
  Filled 2014-05-11: qty 5

## 2014-05-11 MED ORDER — LACTATED RINGERS IV SOLN
INTRAVENOUS | Status: DC
  Administered 2014-05-11 (×2): via INTRAVENOUS

## 2014-05-11 MED ORDER — FENTANYL CITRATE 0.05 MG/ML IJ SOLN
INTRAMUSCULAR | Status: DC | PRN
  Administered 2014-05-11: 100 ug via INTRAVENOUS

## 2014-05-11 MED ORDER — MIDAZOLAM HCL 5 MG/5ML IJ SOLN
INTRAMUSCULAR | Status: DC | PRN
  Administered 2014-05-11: 2 mg via INTRAVENOUS

## 2014-05-11 MED ORDER — MUPIROCIN 2 % EX OINT: 1.0000 "application " | TOPICAL_OINTMENT | Freq: Once | CUTANEOUS | Status: DC

## 2014-05-11 MED ORDER — PHENYLEPHRINE HCL 10 MG/ML IJ SOLN
10.0000 mg | INTRAVENOUS | Status: DC | PRN
  Administered 2014-05-11: 25 ug/min via INTRAVENOUS

## 2014-05-11 MED ORDER — ONDANSETRON HCL 4 MG/2ML IJ SOLN
INTRAMUSCULAR | Status: AC
  Filled 2014-05-11: qty 2

## 2014-05-11 MED ORDER — ROCURONIUM BROMIDE 50 MG/5ML IV SOLN
INTRAVENOUS | Status: AC
  Filled 2014-05-11: qty 1

## 2014-05-11 MED ORDER — DOCUSATE SODIUM 100 MG PO CAPS: 100.0000 mg | ORAL_CAPSULE | Freq: Two times a day (BID) | ORAL | Status: DC

## 2014-05-11 MED ORDER — HYDROMORPHONE HCL 1 MG/ML IJ SOLN
INTRAMUSCULAR | Status: AC
Start: 2014-05-11 — End: 2014-05-11
  Administered 2014-05-11: 0.5 mg via INTRAVENOUS
  Filled 2014-05-11: qty 1

## 2014-05-11 MED ORDER — LIDOCAINE HCL (CARDIAC) 20 MG/ML IV SOLN
INTRAVENOUS | Status: DC | PRN
  Administered 2014-05-11: 80 mg via INTRAVENOUS

## 2014-05-11 MED ORDER — FENTANYL CITRATE 0.05 MG/ML IJ SOLN
INTRAMUSCULAR | Status: AC
  Filled 2014-05-11: qty 5

## 2014-05-11 MED ORDER — PROPOFOL 10 MG/ML IV BOLUS
INTRAVENOUS | Status: AC
  Filled 2014-05-11: qty 20

## 2014-05-11 MED ORDER — ASPIRIN EC 325 MG PO TBEC: 325.0000 mg | DELAYED_RELEASE_TABLET | Freq: Every day | ORAL | Status: DC

## 2014-05-11 MED ORDER — PHENYLEPHRINE 40 MCG/ML (10ML) SYRINGE FOR IV PUSH (FOR BLOOD PRESSURE SUPPORT)
PREFILLED_SYRINGE | INTRAVENOUS | Status: AC
  Filled 2014-05-11: qty 10

## 2014-05-11 MED ORDER — NEOSTIGMINE METHYLSULFATE 10 MG/10ML IV SOLN
INTRAVENOUS | Status: AC
  Filled 2014-05-11: qty 1

## 2014-05-11 MED ORDER — ARTIFICIAL TEARS OP OINT
TOPICAL_OINTMENT | OPHTHALMIC | Status: DC | PRN
  Administered 2014-05-11: 1 via OPHTHALMIC

## 2014-05-11 SURGICAL SUPPLY — 36 items
COVER PERINEAL POST (MISCELLANEOUS) ×3 IMPLANT
COVER SURGICAL LIGHT HANDLE (MISCELLANEOUS) ×3 IMPLANT
DRAPE IMP U-DRAPE 54X76 (DRAPES) ×3 IMPLANT
DRAPE STERI IOBAN 125X83 (DRAPES) ×3 IMPLANT
DRSG EMULSION OIL 3X3 NADH (GAUZE/BANDAGES/DRESSINGS) ×3 IMPLANT
DRSG MEPILEX BORDER 4X4 (GAUZE/BANDAGES/DRESSINGS) ×3 IMPLANT
DRSG TEGADERM 4X4.75 (GAUZE/BANDAGES/DRESSINGS) ×3 IMPLANT
DURAPREP 26ML APPLICATOR (WOUND CARE) ×3 IMPLANT
ELECT REM PT RETURN 9FT ADLT (ELECTROSURGICAL) ×3
ELECTRODE REM PT RTRN 9FT ADLT (ELECTROSURGICAL) ×1 IMPLANT
GAUZE SPONGE 4X4 12PLY STRL (GAUZE/BANDAGES/DRESSINGS) ×3 IMPLANT
GLOVE BIO SURGEON STRL SZ7.5 (GLOVE) ×6 IMPLANT
GLOVE BIOGEL PI IND STRL 7.0 (GLOVE) ×3 IMPLANT
GLOVE BIOGEL PI IND STRL 8 (GLOVE) ×1 IMPLANT
GLOVE BIOGEL PI INDICATOR 7.0 (GLOVE) ×6
GLOVE BIOGEL PI INDICATOR 8 (GLOVE) ×2
GOWN STRL REUS W/ TWL LRG LVL3 (GOWN DISPOSABLE) ×1 IMPLANT
GOWN STRL REUS W/TWL LRG LVL3 (GOWN DISPOSABLE) ×2
KIT BASIN OR (CUSTOM PROCEDURE TRAY) ×3 IMPLANT
KIT ROOM TURNOVER OR (KITS) ×3 IMPLANT
LINER BOOT UNIVERSAL DISP (MISCELLANEOUS) ×3 IMPLANT
MANIFOLD NEPTUNE II (INSTRUMENTS) ×3 IMPLANT
NS IRRIG 1000ML POUR BTL (IV SOLUTION) ×3 IMPLANT
PACK GENERAL/GYN (CUSTOM PROCEDURE TRAY) ×3 IMPLANT
PAD ARMBOARD 7.5X6 YLW CONV (MISCELLANEOUS) ×6 IMPLANT
SCREW ASNIS 6.5X120MM 326120S (Screw) ×3 IMPLANT
SCREW CANNULATED 120X8.0MM (Screw) ×3 IMPLANT
SCREW CANNULATED 125X8.0MM (Screw) ×6 IMPLANT
STAPLER VISISTAT 35W (STAPLE) ×3 IMPLANT
SUT MON AB 2-0 CT1 36 (SUTURE) ×3 IMPLANT
SUT VIC AB 0 CT1 27 (SUTURE) ×2
SUT VIC AB 0 CT1 27XBRD ANBCTR (SUTURE) ×1 IMPLANT
TOWEL OR 17X24 6PK STRL BLUE (TOWEL DISPOSABLE) ×3 IMPLANT
TOWEL OR 17X26 10 PK STRL BLUE (TOWEL DISPOSABLE) ×3 IMPLANT
TOWEL OR NON WOVEN STRL DISP B (DISPOSABLE) ×3 IMPLANT
WATER STERILE IRR 1000ML POUR (IV SOLUTION) ×3 IMPLANT

## 2014-05-11 NOTE — Progress Notes (Signed)
   05/11/14 0823  OBSTRUCTIVE SLEEP APNEA  Have you ever been diagnosed with sleep apnea through a sleep study? No  Do you snore loudly (loud enough to be heard through closed doors)?  0  Do you often feel tired, fatigued, or sleepy during the daytime? 0  Has anyone observed you stop breathing during your sleep? 0  Do you have, or are you being treated for high blood pressure? 1  BMI more than 35 kg/m2? 0  Age over 72 years old? 1  Neck circumference greater than 40 cm/16 inches? 1  Gender: 1  Obstructive Sleep Apnea Score 4  Score 4 or greater  Results sent to PCP

## 2014-05-11 NOTE — Discharge Instructions (Signed)
Keep your dressings on for 5 days. On the 5th day you may remove your dressing and shower.  Bear weight as tolerated  General Anesthesia, Adult, Care After  Refer to this sheet in the next few weeks. These instructions provide you with information on caring for yourself after your procedure. Your health care provider may also give you more specific instructions. Your treatment has been planned according to current medical practices, but problems sometimes occur. Call your health care provider if you have any problems or questions after your procedure.  WHAT TO EXPECT AFTER THE PROCEDURE  After the procedure, it is typical to experience:  Sleepiness.  Nausea and vomiting. HOME CARE INSTRUCTIONS  For the first 24 hours after general anesthesia:  Have a responsible person with you.  Do not drive a car. If you are alone, do not take public transportation.  Do not drink alcohol.  Do not take medicine that has not been prescribed by your health care provider.  Do not sign important papers or make important decisions.  You may resume a normal diet and activities as directed by your health care provider.  Change bandages (dressings) as directed.  If you have questions or problems that seem related to general anesthesia, call the hospital and ask for the anesthetist or anesthesiologist on call. SEEK MEDICAL CARE IF:  You have nausea and vomiting that continue the day after anesthesia.  You develop a rash. SEEK IMMEDIATE MEDICAL CARE IF:  You have difficulty breathing.  You have chest pain.  You have any allergic problems. Document Released: 07/02/2000 Document Revised: 11/26/2012 Document Reviewed: 10/09/2012  [facility] Patient Information 2014 [place], [place].   What to eat:  For your first meals, you should eat lightly; only small meals initially.  If you do not have nausea, you may eat larger meals.  Avoid spicy, greasy and heavy food.    Sore Throat    A sore throat is a painful,  burning, sore, or scratchy feeling of the throat. There may be pain or tenderness when swallowing or talking. You may have other symptoms with a sore throat. These include coughing, sneezing, fever, or a swollen neck. A sore throat is often the first sign of another sickness. These sicknesses may include a cold, flu, strep throat, or an infection called mono. Most sore throats go away without medical treatment.  HOME CARE  Only take medicine as told by your doctor.  Drink enough fluids to keep your pee (urine) clear or pale yellow.  Rest as needed.  Try using throat sprays, lozenges, or suck on hard candy (if older than 4 years or as told).  Sip warm liquids, such as broth, herbal tea, or warm water with honey. Try sucking on frozen ice pops or drinking cold liquids.  Rinse the mouth (gargle) with salt water. Mix 1 teaspoon salt with 8 ounces of water.  Do not smoke. Avoid being around others when they are smoking.  Put a humidifier in your bedroom at night to moisten the air. You can also turn on a hot shower and sit in the bathroom for 5-10 minutes. Be sure the bathroom door is closed. GET HELP RIGHT AWAY IF:  You have trouble breathing.  You cannot swallow fluids, soft foods, or your spit (saliva).  You have more puffiness (swelling) in the throat.  Your sore throat does not get better in 7 days.  You feel sick to your stomach (nauseous) and throw up (vomit).  You have a fever or  lasting symptoms for more than 2-3 days.  You have a fever and your symptoms suddenly get worse. MAKE SURE YOU:  Understand these instructions.  Will watch your condition.  Will get help right away if you are not doing well or get worse. Document Released: 01/03/2008 Document Revised: 12/19/2011 Document Reviewed: 12/02/2011  [facility] Patient Information 2015 [place], [place]. This information is not intended to replace advice given to you by your health care provider. Make sure you discuss any questions you have  with your health care provider.

## 2014-05-11 NOTE — Anesthesia Procedure Notes (Signed)
Procedure Name: Intubation Date/Time: 05/11/2014 11:05 AM Performed by: Angelica PouSMITH, Ronald [name] Pre-anesthesia Checklist: Patient identified, Patient being monitored, Emergency Drugs available, Timeout performed and Suction available Patient Re-evaluated:Patient Re-evaluated prior to inductionOxygen Delivery Method: Circle system utilized Preoxygenation: Pre-oxygenation with 100% oxygen Intubation Type: IV induction Ventilation: Mask ventilation without difficulty Laryngoscope Size: Mac and 3 Grade View: Grade I Tube type: Oral Tube size: 7.0 mm Number of attempts: 1 Airway Equipment and Method: Stylet Placement Confirmation: ETT inserted through vocal cords under direct vision,  breath sounds checked- equal and bilateral and positive ETCO2 Secured at: 22 cm Tube secured with: Tape Dental Injury: Teeth and Oropharynx as per pre-operative assessment

## 2014-05-11 NOTE — Anesthesia Preprocedure Evaluation (Addendum)
Anesthesia Evaluation  Patient identified by MRN, date of birth, ID band Patient awake    Reviewed: Allergy & Precautions, NPO status , Patient's Chart, lab work & pertinent test results  History of Anesthesia Complications Negative for: history of anesthetic complications  Airway Mallampati: II  TM Distance: >3 FB Neck ROM: Full    Dental  (+) Dental Advisory Given   Pulmonary former smoker,  breath sounds clear to auscultation  Pulmonary exam normal       Cardiovascular hypertension, + CAD and + Peripheral Vascular Disease Rhythm:Regular Rate:Normal  Thoracic aortic aneurysm-- 5cm.   Neuro/Psych    GI/Hepatic Neg liver ROS, GERD-  Medicated and Controlled,  Endo/Other  negative endocrine ROS  Renal/GU negative Renal ROS     Musculoskeletal   Abdominal   Peds  Hematology   Anesthesia Other Findings   Reproductive/Obstetrics                           Anesthesia Physical Anesthesia Plan  ASA: II  Anesthesia Plan: General   Post-op Pain Management:    Induction: Intravenous  Airway Management Planned: Oral ETT  Additional Equipment:   Intra-op Plan:   Post-operative Plan: Extubation in OR  Informed Consent: I have reviewed the patients History and Physical, chart, labs and discussed the procedure including the risks, benefits and alternatives for the proposed anesthesia with the patient or authorized representative who has indicated his/her understanding and acceptance.   Dental advisory given  Plan Discussed with: CRNA, Surgeon and Anesthesiologist  Anesthesia Plan Comments:         Anesthesia Quick Evaluation

## 2014-05-11 NOTE — H&P (Signed)
ORTHOPAEDIC CONSULTATION  REQUESTING PHYSICIAN: Renette Butters, MD  Chief Complaint: right nondisplaced femoral neck fracture  HPI: Ronald [name] is a 74 y.o. male who complains of  pain  Past Medical History  Diagnosis Date  . Hypertension   . Hypercholesteremia     managed by dr Joylene Draft  . 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   . Thoracic aortic aneurysm     last scan in Jan 2013. Now measuring 5.0cm. Referred to Dr. Servando Snare  . Malaria   . SVT (supraventricular tachycardia)     remote episode during exercise test  . Lung nodules     noted on past CT scans  . Hx of Bell's palsy     left face  . Wears glasses   . Wears dentures     bottom   Past Surgical History  Procedure Laterality Date  . Cervical fusion  03-2010    c2-c7  . Rotator cuff surgery  1997    lt  . Elbow surgery  1997    lt and rt  . Retinal detachment surgery  1975    lt  . Coronary stent placement  2004  . Tonsillectomy    . Eye surgery    . Colonoscopy      x4  . Shoulder arthroscopy  2/15    right-got cardiac clearance-gsc  . Trigger finger release Left 10/12/2013    Procedure: LEFT LONG FINGER TRIGGER RELEASE;  Surgeon: Jolyn Nap, MD;  Location: [facility];  Service: Orthopedics;  Laterality: Left;   History   Social History  . Marital Status: Married    Spouse Name: N/A    Number of Children: 1  . Years of Education: N/A   Occupational History  . retired Education officer, [employer]    Social History Main Topics  . Smoking status: Former Smoker    Quit date: 04/09/1969  . Smokeless tobacco: Former Systems developer    Types: [place] date: 03/09/2010  . Alcohol Use: 0.5 oz/week    1 drink(s) per week     Comment: occasional  . Drug Use: No  . Sexual Activity: Yes   Other Topics Concern  . Not on file   Social History Narrative   Family History  Problem Relation  Age of Onset  . Heart disease Father    Allergies  Allergen Reactions  . Vesicare [Solifenacin Succinate] Itching and Rash  . Statins Other (See Comments)    myalgias  . Promethazine Hcl Other (See Comments)    Agitation and incoherence (phenergan)   Prior to Admission medications   Medication Sig Start Date End Date Taking? Authorizing Provider  aspirin 325 MG tablet Take 325 mg by mouth daily after supper.    Yes Historical Provider, MD  Coenzyme Q10 (COQ-10) 200 MG CAPS Take 200 mg by mouth daily after supper.    Yes Historical Provider, MD  colesevelam (WELCHOL) 625 MG tablet Take 3,750 mg by mouth daily after supper. 6 tablets   Yes Historical Provider, MD  ezetimibe (ZETIA) 10 MG tablet Take 5 mg by mouth daily.    Yes Historical Provider, MD  fluticasone (FLONASE) 50 MCG/ACT nasal spray Place 1 spray into the nose 2 (two) times daily as needed (seasonal allergies/ drainage).  12/20/11  Yes Historical Provider, MD  Glucosamine-Chondroit-Vit  C-Mn (GLUCOSAMINE CHONDR 1500 COMPLX) CAPS Take 2 capsules by mouth daily after supper.    Yes Historical Provider, MD  HYDROcodone-acetaminophen (NORCO/VICODIN) 5-325 MG per tablet Take 1-2 tablets by mouth every 6 (six) hours as needed for moderate pain or severe pain. 04/30/14  Yes Carrie Mew, PA-C  lisinopril (PRINIVIL,ZESTRIL) 40 MG tablet Take 20 mg by mouth daily after supper.    Yes Historical Provider, MD  Multiple Vitamin (MULTIVITAMIN WITH MINERALS) TABS tablet Take 1 tablet by mouth daily after supper.   Yes Historical Provider, MD  naproxen sodium (ANAPROX) 220 MG tablet Take 220-440 mg by mouth 2 (two) times daily as needed (pain).   Yes Historical Provider, MD  Omega-3 Fatty Acids (FISH OIL TRIPLE STRENGTH) 1400 MG CAPS Take 2,800 mg by mouth daily.   Yes Historical Provider, MD  omeprazole (PRILOSEC) 20 MG capsule Take 20 mg by mouth daily after supper.    Yes Historical Provider, MD  Pitavastatin Calcium 4 MG TABS Take 4 mg by  mouth daily after supper.   Yes Historical Provider, MD   No results found.  Positive ROS: All other systems have been reviewed and were otherwise negative with the exception of those mentioned in the HPI and as above.  Labs cbc No results for input(s): WBC, HGB, HCT, PLT in the last 72 hours.  Labs inflam No results for input(s): CRP in the last 72 hours.  Invalid input(s): ESR  Labs coag No results for input(s): INR, PTT in the last 72 hours.  Invalid input(s): PT  No results for input(s): NA, K, CL, CO2, GLUCOSE, BUN, CREATININE, CALCIUM in the last 72 hours.  Physical Exam: There were no vitals filed for this visit. General: Alert, no acute distress Cardiovascular: No pedal edema Respiratory: No cyanosis, no use of accessory musculature GI: No organomegaly, abdomen is soft and non-tender Skin: No lesions in the area of chief complaint other than those listed below in MSK exam.  Neurologic: Sensation intact distally Psychiatric: Patient is competent for consent with normal mood and affect Lymphatic: No axillary or cervical lymphadenopathy  MUSCULOSKELETAL:  RLE: pain with any ROM Other extremities are atraumatic with painless ROM and NVI.  Assessment: Nondisplaced R femoral neck fracture  Plan: Hip pinning    [name], D, MD Cell [phone]   05/11/2014 7:08 AM

## 2014-05-11 NOTE — Anesthesia Postprocedure Evaluation (Signed)
  Anesthesia Post-op Note  Patient: Ronald [name]  Procedure(s) Performed: Procedure(s): CANNULATED HIP PINNING (Right)  Patient Location: PACU  Anesthesia Type:General  Level of Consciousness: awake  Airway and Oxygen Therapy: Patient Spontanous Breathing  Post-op Pain: mild  Post-op Assessment: Post-op Vital signs reviewed  Post-op Vital Signs: Reviewed  Last Vitals:  Filed Vitals:   05/11/14 1315  BP: 120/75  Pulse: 50  Temp:   Resp: 15    Complications: No apparent anesthesia complications

## 2014-05-11 NOTE — Op Note (Signed)
05/11/2014  11:44 AM  PATIENT:  Ronald [name]    PRE-OPERATIVE DIAGNOSIS:  RIGHT FEMORAL NECK FRACTURE  POST-OPERATIVE DIAGNOSIS:  Same  PROCEDURE:  CANNULATED HIP PINNING  SURGEON:  Tani Virgo, D, MD  ASSISTANT: None  ANESTHESIA:   General  PREOPERATIVE INDICATIONS:  Ronald [name] is a  74 y.o. male who fell and was found to have a diagnosis of RIGHT FEMORAL NECK FRACTURE who elected for surgical management.    The risks benefits and alternatives were discussed with the patient preoperatively including but not limited to the risks of infection, bleeding, nerve injury, cardiopulmonary complications, blood clots, malunion, nonunion, avascular necrosis, the need for revision surgery, the potential for conversion to hemiarthroplasty, among others, and the patient was willing to proceed.  OPERATIVE IMPLANTS: 6.5 mm cannulated screws x1, 8.0 cannulated screws x 2  OPERATIVE FINDINGS: Clinical osteoporosis with weak bone, proximal femur  OPERATIVE PROCEDURE: The patient was brought to the operating room and placed in supine position. IV antibiotics were given. General anesthesia administered. Foley was also given. The patient was placed on the fracture table. The operative extremity was positioned, without any significant reduction maneuver and was prepped and draped in usual sterile fashion.  Time out was performed.  Small incisions were made distal to the greater trochanter, and 3 guidewires were introduced Into an inverted triangle configuration. The lengths were measured. The reduction was slightly valgus, and near-anatomic. I opened the cortex with a cannulated drill, and then placed the screws into position. Satisfactory fixation was achieved. I sequentially tightened the screws by hand.  I performed a live fluoroscopic exam and no screw penetrance was noted. All threads crossed the fracture site.   The wounds were irrigated copiously, and repaired with Vicryl with Steri-Strips  and sterile gauze. There no complications and the patient tolerated the procedure well.  The patient will be weightbearing as tolerated, VTE prophylaxis will be: ASA 325 daily   This note was generated using a template and dragon dictation system. In light of that, I have reviewed the note and all aspects of it are applicable to this case. Any dictation errors are due to the computerized dictation system.

## 2014-05-11 NOTE — Interval H&P Note (Signed)
History and Physical Interval Note:  05/11/2014 7:09 AM  Ronald [name]  has presented today for surgery, with the diagnosis of RIGHT FEMORAL NECK FRACTURE  The various methods of treatment have been discussed with the patient and family. After consideration of risks, benefits and other options for treatment, the patient has consented to  Procedure(s): CANNULATED HIP PINNING (Right) as a surgical intervention .  The patient's history has been reviewed, patient examined, no change in status, stable for surgery.  I have reviewed the patient's chart and labs.  Questions were answered to the patient's satisfaction.     MURPHY, TIMOTHY, D

## 2014-05-11 NOTE — Anesthesia Postprocedure Evaluation (Signed)
  Anesthesia Post-op Note  Patient: Ronald SquibbJames S [name]  Procedure(s) Performed: Procedure(s): CANNULATED HIP PINNING (Right)  Patient Location: PACU  Anesthesia Type:General  Level of Consciousness: awake  Airway and Oxygen Therapy: Patient Spontanous Breathing  Post-op Pain: mild  Post-op Assessment: Post-op Vital signs reviewed  Post-op Vital Signs: Reviewed  Last Vitals:  Filed Vitals:   05/11/14 1200  BP: 122/74  Pulse: 55  Temp: 36.4 C  Resp: 12    Complications: No apparent anesthesia complications

## 2014-05-11 NOTE — Transfer of Care (Signed)
Immediate Anesthesia Transfer of Care Note  Patient: Ronald [name]  Procedure(s) Performed: Procedure(s): CANNULATED HIP PINNING (Right)  Patient Location: PACU  Anesthesia Type:General  Level of Consciousness: awake, alert , oriented and patient cooperative  Airway & Oxygen Therapy: Patient Spontanous Breathing and Patient connected to nasal cannula oxygen  Post-op Assessment: Report given to RN, Post -op Vital signs reviewed and stable and Patient moving all extremities  Post vital signs: Reviewed and stable  Complications: No apparent anesthesia complications

## 2014-05-17 ENCOUNTER — Encounter (HOSPITAL_COMMUNITY): Payer: Self-pay | Admitting: Orthopedic Surgery

## 2014-05-24 ENCOUNTER — Encounter (HOSPITAL_COMMUNITY): Payer: Self-pay | Admitting: Orthopedic Surgery

## 2014-05-25 ENCOUNTER — Encounter (HOSPITAL_COMMUNITY): Payer: Self-pay | Admitting: Orthopedic Surgery

## 2014-08-06 ENCOUNTER — Emergency Department (HOSPITAL_COMMUNITY): Payer: Medicare PPO

## 2014-08-06 ENCOUNTER — Emergency Department (HOSPITAL_COMMUNITY)
Admission: EM | Admit: 2014-08-06 | Discharge: 2014-08-06 | Disposition: A | Discharge status: Home / Self Care | Payer: Medicare PPO | Attending: Emergency Medicine | Admitting: Emergency Medicine

## 2014-08-06 ENCOUNTER — Encounter (HOSPITAL_COMMUNITY): Payer: Self-pay

## 2014-08-06 DIAGNOSIS — N200 Calculus of kidney: Secondary | ICD-10-CM | POA: Insufficient documentation

## 2014-08-06 DIAGNOSIS — N201 Calculus of ureter: Secondary | ICD-10-CM | POA: Diagnosis not present

## 2014-08-06 DIAGNOSIS — K219 Gastro-esophageal reflux disease without esophagitis: Secondary | ICD-10-CM | POA: Diagnosis not present

## 2014-08-06 DIAGNOSIS — I10 Essential (primary) hypertension: Secondary | ICD-10-CM | POA: Insufficient documentation

## 2014-08-06 DIAGNOSIS — R109 Unspecified abdominal pain: Secondary | ICD-10-CM

## 2014-08-06 DIAGNOSIS — N3090 Cystitis, unspecified without hematuria: Secondary | ICD-10-CM | POA: Diagnosis not present

## 2014-08-06 DIAGNOSIS — M1990 Unspecified osteoarthritis, unspecified site: Secondary | ICD-10-CM | POA: Insufficient documentation

## 2014-08-06 DIAGNOSIS — I471 Supraventricular tachycardia: Secondary | ICD-10-CM | POA: Insufficient documentation

## 2014-08-06 DIAGNOSIS — E780 Pure hypercholesterolemia: Secondary | ICD-10-CM | POA: Diagnosis not present

## 2014-08-06 DIAGNOSIS — Z7951 Long term (current) use of inhaled steroids: Secondary | ICD-10-CM | POA: Insufficient documentation

## 2014-08-06 DIAGNOSIS — Z8701 Personal history of pneumonia (recurrent): Secondary | ICD-10-CM | POA: Insufficient documentation

## 2014-08-06 DIAGNOSIS — I2510 Atherosclerotic heart disease of native coronary artery without angina pectoris: Secondary | ICD-10-CM | POA: Insufficient documentation

## 2014-08-06 DIAGNOSIS — J9811 Atelectasis: Secondary | ICD-10-CM | POA: Diagnosis not present

## 2014-08-06 DIAGNOSIS — Z79899 Other long term (current) drug therapy: Secondary | ICD-10-CM | POA: Diagnosis not present

## 2014-08-06 DIAGNOSIS — Z7982 Long term (current) use of aspirin: Secondary | ICD-10-CM | POA: Insufficient documentation

## 2014-08-06 DIAGNOSIS — Z87891 Personal history of nicotine dependence: Secondary | ICD-10-CM | POA: Insufficient documentation

## 2014-08-06 DIAGNOSIS — K5730 Diverticulosis of large intestine without perforation or abscess without bleeding: Secondary | ICD-10-CM | POA: Diagnosis not present

## 2014-08-06 DIAGNOSIS — N1330 Unspecified hydronephrosis: Secondary | ICD-10-CM | POA: Diagnosis not present

## 2014-08-06 LAB — CBC WITH DIFFERENTIAL/PLATELET
BASOS ABS: 0 10*3/uL (ref 0.0–0.1)
Basophils Relative: 0 % (ref 0–1)
EOS PCT: 0 % (ref 0–5)
Eosinophils Absolute: 0 10*3/uL (ref 0.0–0.7)
HEMATOCRIT: 44 % (ref 39.0–52.0)
Hemoglobin: 14.7 g/dL (ref 13.0–17.0)
Lymphocytes Relative: 14 % (ref 12–46)
Lymphs Abs: 1.7 10*3/uL (ref 0.7–4.0)
MCH: 30.5 pg (ref 26.0–34.0)
MCHC: 33.4 g/dL (ref 30.0–36.0)
MCV: 91.3 fL (ref 78.0–100.0)
Monocytes Absolute: 1.5 10*3/uL — ABNORMAL HIGH (ref 0.1–1.0)
Monocytes Relative: 12 % (ref 3–12)
NEUTROS ABS: 9.2 10*3/uL — AB (ref 1.7–7.7)
Neutrophils Relative %: 74 % (ref 43–77)
Platelets: 234 10*3/uL (ref 150–400)
RBC: 4.82 MIL/uL (ref 4.22–5.81)
RDW: 13.3 % (ref 11.5–15.5)
WBC: 12.4 10*3/uL — ABNORMAL HIGH (ref 4.0–10.5)

## 2014-08-06 LAB — I-STAT CHEM 8, ED
BUN: 28 mg/dL — AB (ref 6–23)
CALCIUM ION: 1.2 mmol/L (ref 1.13–1.30)
CHLORIDE: 104 mmol/L (ref 96–112)
Creatinine, Ser: 1.7 mg/dL — ABNORMAL HIGH (ref 0.50–1.35)
Glucose, Bld: 141 mg/dL — ABNORMAL HIGH (ref 70–99)
HCT: 45 % (ref 39.0–52.0)
Hemoglobin: 15.3 g/dL (ref 13.0–17.0)
Potassium: 4.4 mmol/L (ref 3.5–5.1)
Sodium: 140 mmol/L (ref 135–145)
TCO2: 23 mmol/L (ref 0–100)

## 2014-08-06 LAB — URINALYSIS, ROUTINE W REFLEX MICROSCOPIC
BILIRUBIN URINE: NEGATIVE
Glucose, UA: NEGATIVE mg/dL
KETONES UR: 15 mg/dL — AB
Leukocytes, UA: NEGATIVE
NITRITE: NEGATIVE
Protein, ur: NEGATIVE mg/dL
Specific Gravity, Urine: 1.031 — ABNORMAL HIGH (ref 1.005–1.030)
Urobilinogen, UA: 0.2 mg/dL (ref 0.0–1.0)
pH: 5 (ref 5.0–8.0)

## 2014-08-06 LAB — COMPREHENSIVE METABOLIC PANEL
ALK PHOS: 68 U/L (ref 39–117)
ALT: 16 U/L (ref 0–53)
ANION GAP: 8 (ref 5–15)
AST: 22 U/L (ref 0–37)
Albumin: 4 g/dL (ref 3.5–5.2)
BILIRUBIN TOTAL: 1.1 mg/dL (ref 0.3–1.2)
BUN: 22 mg/dL (ref 6–23)
CALCIUM: 9 mg/dL (ref 8.4–10.5)
CHLORIDE: 104 mmol/L (ref 96–112)
CO2: 25 mmol/L (ref 19–32)
Creatinine, Ser: 1.91 mg/dL — ABNORMAL HIGH (ref 0.50–1.35)
GFR calc Af Amer: 39 mL/min — ABNORMAL LOW (ref >90)
GFR calc non Af Amer: 34 mL/min — ABNORMAL LOW (ref >90)
GLUCOSE: 141 mg/dL — AB (ref 70–99)
POTASSIUM: 4.4 mmol/L (ref 3.5–5.1)
Sodium: 137 mmol/L (ref 135–145)
TOTAL PROTEIN: 7.3 g/dL (ref 6.0–8.3)

## 2014-08-06 LAB — URINE MICROSCOPIC-ADD ON

## 2014-08-06 LAB — I-STAT CG4 LACTIC ACID, ED: LACTIC ACID, VENOUS: 0.62 mmol/L (ref 0.5–2.0)

## 2014-08-06 LAB — LIPASE, BLOOD: LIPASE: 38 U/L (ref 11–59)

## 2014-08-06 MED ORDER — SODIUM CHLORIDE 0.9 % IV BOLUS (SEPSIS)
1000.0000 mL | Freq: Once | INTRAVENOUS | Status: AC
  Administered 2014-08-06: 1000 mL via INTRAVENOUS

## 2014-08-06 MED ORDER — IOHEXOL 300 MG/ML  SOLN
80.0000 mL | Freq: Once | INTRAMUSCULAR | Status: AC | PRN
  Administered 2014-08-06: 80 mL via INTRAVENOUS

## 2014-08-06 MED ORDER — IOHEXOL 300 MG/ML  SOLN: 25.0000 mL | Freq: Once | INTRAMUSCULAR | Status: DC | PRN

## 2014-08-06 MED ORDER — HYDROCODONE-ACETAMINOPHEN 5-325 MG PO TABS: 1.0000 | ORAL_TABLET | Freq: Two times a day (BID) | ORAL | Status: DC | PRN

## 2014-08-06 MED ORDER — MORPHINE SULFATE 4 MG/ML IJ SOLN
6.0000 mg | Freq: Once | INTRAMUSCULAR | Status: AC
  Administered 2014-08-06: 6 mg via INTRAVENOUS
  Filled 2014-08-06 (×2): qty 2

## 2014-08-06 MED ORDER — CEPHALEXIN 500 MG PO CAPS: 500.0000 mg | ORAL_CAPSULE | Freq: Two times a day (BID) | ORAL | Status: DC

## 2014-08-06 MED ORDER — DEXTROSE 5 % IV SOLN
1.0000 g | Freq: Once | INTRAVENOUS | Status: AC
  Administered 2014-08-06: 1 g via INTRAVENOUS
  Filled 2014-08-06: qty 10

## 2014-08-06 MED ORDER — HYDROMORPHONE HCL 1 MG/ML IJ SOLN
1.0000 mg | Freq: Once | INTRAMUSCULAR | Status: AC
Start: 2014-08-06 — End: 2014-08-06
  Administered 2014-08-06: 1 mg via INTRAVENOUS
  Filled 2014-08-06: qty 1

## 2014-08-06 MED ORDER — KETOROLAC TROMETHAMINE 30 MG/ML IJ SOLN
15.0000 mg | Freq: Once | INTRAMUSCULAR | Status: AC
  Administered 2014-08-06: 15 mg via INTRAVENOUS
  Filled 2014-08-06: qty 1

## 2014-08-06 MED ORDER — ONDANSETRON HCL 4 MG/2ML IJ SOLN
4.0000 mg | Freq: Once | INTRAMUSCULAR | Status: AC
  Administered 2014-08-06: 4 mg via INTRAVENOUS
  Filled 2014-08-06: qty 2

## 2014-08-06 MED ORDER — MAGNESIUM CITRATE PO SOLN: 1.0000 | Freq: Once | ORAL | Status: DC

## 2014-08-06 NOTE — ED Provider Notes (Signed)
CSN: [id number]     Arrival date & time 08/06/14  6213 History  This chart was scribed for Tomasita Crumble, MD by Annye Asa, ED Scribe. This patient was seen in room D31C/D31C and the patient's care was started at 3:57 AM.    No chief complaint on file.  The history is provided by the patient. No language interpreter was used.     HPI Comments: Ronald [name] is a 74 y.o. male who presents to the Emergency Department complaining of 2 days of constant abdominal pain, intermittent nausea and vomiting (1x). Patient is unable to further specify the sensation of his abdominal pain; states, "It just hurts." His pain is unaffected by applied pressure. No modifying factors noted at this time. He denies diarrhea, fevers, diaphoresis, dysuria, hematuria. He denies recent medication changes.   Past Medical History  Diagnosis Date  . Hypertension   . Hypercholesteremia     managed by dr Waynard Edwards  . 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   . Thoracic aortic aneurysm     last scan in Jan 2013. Now measuring 5.0cm. Referred to Dr. Tyrone Sage  . Malaria   . SVT (supraventricular tachycardia)     remote episode during exercise test  . Lung nodules     noted on past CT scans  . Hx of Bell's palsy     left face  . Wears glasses   . Wears dentures     bottom  . History of pneumonia     74 years old  . GERD (gastroesophageal reflux disease)   . Arthritis   . Coronary artery disease     Stent 2004   Past Surgical History  Procedure Laterality Date  . Cervical fusion  03-2010    c2-c7  . Rotator cuff surgery  1997    lt  . Elbow surgery  1997    lt and rt  . Retinal detachment surgery  1975    lt  . Coronary stent placement  2004  . Tonsillectomy    . Eye surgery    . Colonoscopy      x4  . Shoulder arthroscopy  2/15    right-got cardiac clearance-gsc  . Trigger finger release Left 10/12/2013     Procedure: LEFT LONG FINGER TRIGGER RELEASE;  Surgeon: Jodi Marble, MD;  Location: [facility];  Service: Orthopedics;  Laterality: Left;  . Dental implant      x 2  . Hip pinning,cannulated Right 05/11/2014    Procedure: CANNULATED HIP PINNING;  Surgeon: Sheral Apley, MD;  Location: [facility] OR;  Service: Orthopedics;  Laterality: Right;   Family History  Problem Relation Age of Onset  . Heart disease Father    History  Substance Use Topics  . Smoking status: Former Smoker    Quit date: 04/09/1969  . Smokeless tobacco: Current User    Types: Chew     Comment: reports that when he smoked it was intermittent  . Alcohol Use: 0.5 oz/week    1 Standard drinks or equivalent per week     Comment: occasional mainly during holiday    Review of Systems  Constitutional: Negative for fever and diaphoresis.  Gastrointestinal: Positive for nausea, vomiting and abdominal pain. Negative for diarrhea.  Genitourinary: Negative for dysuria and hematuria.  All other systems reviewed and are negative.  Allergies  Vesicare; Statins; and Promethazine hcl  Home Medications   Prior to Admission medications   Medication Sig Start Date End Date Taking? Authorizing Provider  aspirin 325 MG tablet Take 325 mg by mouth daily after supper.     Historical Provider, MD  aspirin EC 325 MG tablet Take 1 tablet (325 mg total) by mouth daily. 05/11/14   Sheral Apley, MD  Coenzyme Q10 (COQ-10) 200 MG CAPS Take 200 mg by mouth daily after supper.     Historical Provider, MD  colesevelam (WELCHOL) 625 MG tablet Take 3,750 mg by mouth daily after supper. 6 tablets    Historical Provider, MD  docusate sodium (COLACE) 100 MG capsule Take 1 capsule (100 mg total) by mouth 2 (two) times daily. 05/11/14   Sheral Apley, MD  ezetimibe (ZETIA) 10 MG tablet Take 5 mg by mouth daily.     Historical Provider, MD  fluticasone (FLONASE) 50 MCG/ACT nasal spray Place 1 spray into the nose 2 (two) times  daily as needed (seasonal allergies/ drainage).  12/20/11   Historical Provider, MD  Glucosamine-Chondroit-Vit C-Mn (GLUCOSAMINE CHONDR 1500 COMPLX) CAPS Take 2 capsules by mouth daily after supper.     Historical Provider, MD  lisinopril (PRINIVIL,ZESTRIL) 40 MG tablet Take 20 mg by mouth daily after supper.     Historical Provider, MD  Multiple Vitamin (MULTIVITAMIN WITH MINERALS) TABS tablet Take 1 tablet by mouth daily after supper.    Historical Provider, MD  naproxen sodium (ANAPROX) 220 MG tablet Take 220-440 mg by mouth 2 (two) times daily as needed (pain).    Historical Provider, MD  Omega-3 Fatty Acids (FISH OIL TRIPLE STRENGTH) 1400 MG CAPS Take 2,800 mg by mouth daily.    Historical Provider, MD  omeprazole (PRILOSEC) 20 MG capsule Take 20 mg by mouth daily after supper.     Historical Provider, MD  oxyCODONE-acetaminophen (ROXICET) 5-325 MG per tablet Take 2 tablets by mouth every 4 (four) hours as needed. 05/11/14   Sheral Apley, MD  Pitavastatin Calcium 4 MG TABS Take 4 mg by mouth daily after supper.    Historical Provider, MD   BP 135/81 mmHg  Pulse 72  Temp(Src) 98.8 F (37.1 C) (Oral)  Resp 20  Ht  (2.007 m)  Wt 268 lb (121.564 kg)  BMI 30.18 kg/m2  SpO2 97% Physical Exam  Constitutional: He is oriented to person, place, and time. Vital signs are normal. He appears well-developed and well-nourished.  Non-toxic appearance. He does not appear ill. No distress.  HENT:  Head: Normocephalic and atraumatic.  Nose: Nose normal.  Mouth/Throat: Oropharynx is clear and moist. No oropharyngeal exudate.  Eyes: Conjunctivae and EOM are normal. Pupils are equal, round, and reactive to light. No scleral icterus.  Neck: Normal range of motion. Neck supple. No tracheal deviation, no edema, no erythema and normal range of motion present. No thyroid mass and no thyromegaly present.  Cardiovascular: Normal rate, regular rhythm, S1 normal, S2 normal, normal heart sounds, intact distal  pulses and normal pulses.  Exam reveals no gallop and no friction rub.   No murmur heard. Pulses:      Radial pulses are 2+ on the right side, and 2+ on the left side.       Dorsalis pedis pulses are 2+ on the right side, and 2+ on the left side.  Pulmonary/Chest: Effort normal and breath sounds normal. No respiratory distress. He has no wheezes. He has no rhonchi. He has no  rales.  Abdominal: Soft. Normal appearance and bowel sounds are normal. He exhibits no distension, no ascites and no mass. There is no hepatosplenomegaly. There is no tenderness. There is no rebound, no guarding and no CVA tenderness.  Musculoskeletal: Normal range of motion. He exhibits no edema or tenderness.  Lymphadenopathy:    He has no cervical adenopathy.  Neurological: He is alert and oriented to person, place, and time. He has normal strength. No cranial nerve deficit or sensory deficit.  Skin: Skin is warm, dry and intact. No petechiae and no rash noted. He is not diaphoretic. No erythema. No pallor.  Psychiatric: He has a normal mood and affect. His behavior is normal. Judgment normal.  Nursing note and vitals reviewed.   ED Course  Procedures   DIAGNOSTIC STUDIES: Oxygen Saturation is 97% on RA, adequate by my interpretation.    COORDINATION OF CARE: 3:59 AM Discussed treatment plan with pt at bedside and pt agreed to plan.   Labs Review Labs Reviewed  CBC WITH DIFFERENTIAL/PLATELET - Abnormal; Notable for the following:    WBC 12.4 (*)    Neutro Abs 9.2 (*)    Monocytes Absolute 1.5 (*)    All other components within normal limits  COMPREHENSIVE METABOLIC PANEL - Abnormal; Notable for the following:    Glucose, Bld 141 (*)    Creatinine, Ser 1.91 (*)    GFR calc non Af Amer 34 (*)    GFR calc Af Amer 39 (*)    All other components within normal limits  URINALYSIS, ROUTINE W REFLEX MICROSCOPIC - Abnormal; Notable for the following:    Color, Urine AMBER (*)    Specific Gravity, Urine 1.031 (*)     Hgb urine dipstick LARGE (*)    Ketones, ur 15 (*)    All other components within normal limits  I-STAT CHEM 8, ED - Abnormal; Notable for the following:    BUN 28 (*)    Creatinine, Ser 1.70 (*)    Glucose, Bld 141 (*)    All other components within normal limits  LIPASE, BLOOD  URINE MICROSCOPIC-ADD ON  I-STAT CG4 LACTIC ACID, ED    Imaging Review Ct Chest W Contrast  08/06/2014   CLINICAL DATA:  Persistent abdominal pain, intermittent nausea and vomiting, acute onset. Initial encounter.  EXAM: CT CHEST, ABDOMEN, AND PELVIS WITH CONTRAST  TECHNIQUE: Multidetector CT imaging of the chest, abdomen and pelvis was performed following the standard protocol during bolus administration of intravenous contrast.  CONTRAST:  80mL OMNIPAQUE IOHEXOL 300 MG/ML  SOLN  COMPARISON:  CTA of the chest performed 12/03/2013, and abdominal ultrasound performed 04/17/2004  FINDINGS: CT CHEST FINDINGS  Minimal bilateral dependent subsegmental atelectasis is noted. A calcified granuloma is noted near the left lung base. The lungs are otherwise clear. No focal consolidation, pleural effusion or pneumothorax is seen. No masses are identified.  Scattered coronary artery calcifications are seen. No mediastinal lymphadenopathy is appreciated. No pericardial effusion is identified. The great vessels are grossly unremarkable in appearance.  The thyroid gland is unremarkable in appearance. No axillary lymphadenopathy is seen.  No acute osseous abnormalities are identified. The patient is status post anterior cervical spinal fusion, partially imaged on this study.  CT ABDOMEN AND PELVIS FINDINGS  The liver and spleen are unremarkable in appearance. The gallbladder is within normal limits. The pancreas is unremarkable in appearance. There is mild prominence of the adrenal glands bilaterally, possibly reflecting mild adrenal hyperplasia.  Mild left-sided hydronephrosis is noted, with left-sided  perinephric stranding, and  prominence of the left ureter to the level of an obstructing 4 mm stone at the mid left ureter, 11 cm below the left renal pelvis. Mild nonspecific right-sided perinephric stranding is seen. No nonobstructing renal stones are identified.  Trace fluid tracking over the left iliopsoas muscle may reflect the ureteral obstruction.  The small bowel is unremarkable in appearance. The stomach is within normal limits. No acute vascular abnormalities are seen.  The appendix is normal in caliber, without evidence of appendicitis. The colon is partially filled with stool. Scattered diverticulosis is noted along the proximal sigmoid colon, without evidence of diverticulitis.  The bladder is mildly distended. Mild soft tissue inflammation about the bladder could reflect cystitis. The prostate remains normal in size. No inguinal lymphadenopathy is seen.  No acute osseous abnormalities are identified. Three pins are noted within the right femoral neck. A sclerotic focus within the left transverse process of L2 may reflect a bone island.  IMPRESSION: 1. Mild left-sided hydronephrosis, with an obstructing 4 mm stone noted at the mid left ureter, 11 cm below the left renal pelvis. 2. Mild soft tissue inflammation about the bladder could reflect cystitis. 3. Trace fluid tracking over the left iliopsoas muscle may reflect the ureteral obstruction. 4. Scattered diverticulosis along the proximal sigmoid colon, without evidence of diverticulitis. 5. Minimal bilateral dependent subsegmental atelectasis noted. Lungs otherwise clear. 6. Scattered coronary artery calcifications seen. 7. Mild prominence of the adrenal glands bilaterally may reflect mild adrenal hyperplasia.   Electronically Signed   By: Roanna Raider M.D.   On: 08/06/2014 05:44   Ct Abdomen Pelvis W Contrast  08/06/2014   CLINICAL DATA:  Persistent abdominal pain, intermittent nausea and vomiting, acute onset. Initial encounter.  EXAM: CT CHEST, ABDOMEN, AND PELVIS WITH  CONTRAST  TECHNIQUE: Multidetector CT imaging of the chest, abdomen and pelvis was performed following the standard protocol during bolus administration of intravenous contrast.  CONTRAST:  80mL OMNIPAQUE IOHEXOL 300 MG/ML  SOLN  COMPARISON:  CTA of the chest performed 12/03/2013, and abdominal ultrasound performed 04/17/2004  FINDINGS: CT CHEST FINDINGS  Minimal bilateral dependent subsegmental atelectasis is noted. A calcified granuloma is noted near the left lung base. The lungs are otherwise clear. No focal consolidation, pleural effusion or pneumothorax is seen. No masses are identified.  Scattered coronary artery calcifications are seen. No mediastinal lymphadenopathy is appreciated. No pericardial effusion is identified. The great vessels are grossly unremarkable in appearance.  The thyroid gland is unremarkable in appearance. No axillary lymphadenopathy is seen.  No acute osseous abnormalities are identified. The patient is status post anterior cervical spinal fusion, partially imaged on this study.  CT ABDOMEN AND PELVIS FINDINGS  The liver and spleen are unremarkable in appearance. The gallbladder is within normal limits. The pancreas is unremarkable in appearance. There is mild prominence of the adrenal glands bilaterally, possibly reflecting mild adrenal hyperplasia.  Mild left-sided hydronephrosis is noted, with left-sided perinephric stranding, and prominence of the left ureter to the level of an obstructing 4 mm stone at the mid left ureter, 11 cm below the left renal pelvis. Mild nonspecific right-sided perinephric stranding is seen. No nonobstructing renal stones are identified.  Trace fluid tracking over the left iliopsoas muscle may reflect the ureteral obstruction.  The small bowel is unremarkable in appearance. The stomach is within normal limits. No acute vascular abnormalities are seen.  The appendix is normal in caliber, without evidence of appendicitis. The colon is partially filled with  stool. Scattered  diverticulosis is noted along the proximal sigmoid colon, without evidence of diverticulitis.  The bladder is mildly distended. Mild soft tissue inflammation about the bladder could reflect cystitis. The prostate remains normal in size. No inguinal lymphadenopathy is seen.  No acute osseous abnormalities are identified. Three pins are noted within the right femoral neck. A sclerotic focus within the left transverse process of L2 may reflect a bone island.  IMPRESSION: 1. Mild left-sided hydronephrosis, with an obstructing 4 mm stone noted at the mid left ureter, 11 cm below the left renal pelvis. 2. Mild soft tissue inflammation about the bladder could reflect cystitis. 3. Trace fluid tracking over the left iliopsoas muscle may reflect the ureteral obstruction. 4. Scattered diverticulosis along the proximal sigmoid colon, without evidence of diverticulitis. 5. Minimal bilateral dependent subsegmental atelectasis noted. Lungs otherwise clear. 6. Scattered coronary artery calcifications seen. 7. Mild prominence of the adrenal glands bilaterally may reflect mild adrenal hyperplasia.   Electronically Signed   By: Roanna Raider M.D.   On: 08/06/2014 05:44     EKG Interpretation None      MDM   Final diagnoses:  None   Patient presents to the ED for worsening abdominal pain over the last 2 days. History is difficult to obtain but patient can not describe his symptoms well.  Will obtain CT scan of the abdomen for further assessment.  CT chest was also ordered to evaluate if his thoracic aneurysm has expanded into the abdomen at all.  CT reports kidney stone that is obstructing and causing hydro on the Left.  Creatine has also increased to 1.9.  Patient pain was controlled with morphine and dilaudid.  I withheld toradol due to AKI.  I spoke with Dr. Alfredia Ferguson with urology who rec for outpatient follow up despite these findings.  He has good pain control now.  UA is neg for infection however CT  findings show cystitis.  In addition to inc Cr, I will cover the patient in case there is a brewing infection.  He got ceftriaxone in the ED and he will be sent home with keflex.  Norco and stool softner Rx was provided for the patient as well.  He was told to see urology within 3 days.  His VS remain within his normal limits and he is safe for DC.  I personally performed the services described in this documentation, which was scribed in my presence. The recorded information has been reviewed and is accurate.       Tomasita Crumble, MD 08/06/14 [phone]

## 2014-08-06 NOTE — ED Notes (Signed)
Pt came from home, daughter at bedside, c/o abdominal pain 8/10 for 48 hours.

## 2014-08-06 NOTE — Discharge Instructions (Signed)
Kidney Stones Ronald [name], take antibiotics as prescribed. Use Motrin at home as needed for pain. Take prescribed pain medication for severe pain only. See urology within 3 days for close follow-up of your kidney stones and to recheck your kidney function. If any symptoms worsen come back to the emergency department immediately.  Thank you Kidney stones (urolithiasis) are solid masses that form inside your kidneys. The intense pain is caused by the stone moving through the kidney, ureter, bladder, and urethra (urinary tract). When the stone moves, the ureter starts to spasm around the stone. The stone is usually passed in your pee (urine).  HOME CARE  Drink enough fluids to keep your pee clear or pale yellow. This helps to get the stone out.  Strain all pee through the provided strainer. Do not pee without peeing through the strainer, not even once. If you pee the stone out, catch it in the strainer. The stone may be as small as a grain of salt. Take this to your doctor. This will help your doctor figure out what you can do to try to prevent more kidney stones.  Only take medicine as told by your doctor.  Follow up with your doctor as told.  Get follow-up X-rays as told by your doctor. GET HELP IF: You have pain that gets worse even if you have been taking pain medicine. GET HELP RIGHT AWAY IF:   Your pain does not get better with medicine.  You have a fever or shaking chills.  Your pain increases and gets worse over 18 hours.  You have new belly (abdominal) pain.  You feel faint or pass out.  You are unable to pee. MAKE SURE YOU:   Understand these instructions.  Will watch your condition.  Will get help right away if you are not doing well or get worse. Document Released: 09/12/2007 Document Revised: 11/26/2012 Document Reviewed: 08/27/2012 [facility] HospitalExit[facility] Patient Information 2015 [employer], [place]. This information is not intended to replace advice given to you by your health care  provider. Make sure you discuss any questions you have with your health care provider.

## 2014-09-28 DIAGNOSIS — Z13820 Encounter for screening for osteoporosis: Secondary | ICD-10-CM | POA: Diagnosis not present

## 2014-09-28 DIAGNOSIS — R7309 Other abnormal glucose: Secondary | ICD-10-CM | POA: Diagnosis not present

## 2014-09-28 DIAGNOSIS — Z125 Encounter for screening for malignant neoplasm of prostate: Secondary | ICD-10-CM | POA: Diagnosis not present

## 2014-09-28 DIAGNOSIS — I2510 Atherosclerotic heart disease of native coronary artery without angina pectoris: Secondary | ICD-10-CM | POA: Diagnosis not present

## 2014-09-28 DIAGNOSIS — I10 Essential (primary) hypertension: Secondary | ICD-10-CM | POA: Diagnosis not present

## 2014-09-28 DIAGNOSIS — E785 Hyperlipidemia, unspecified: Secondary | ICD-10-CM | POA: Diagnosis not present

## 2014-10-05 DIAGNOSIS — M542 Cervicalgia: Secondary | ICD-10-CM | POA: Diagnosis not present

## 2014-10-05 DIAGNOSIS — M545 Low back pain: Secondary | ICD-10-CM | POA: Diagnosis not present

## 2014-10-05 DIAGNOSIS — K130 Diseases of lips: Secondary | ICD-10-CM | POA: Diagnosis not present

## 2014-10-05 DIAGNOSIS — N200 Calculus of kidney: Secondary | ICD-10-CM | POA: Diagnosis not present

## 2014-10-05 DIAGNOSIS — N318 Other neuromuscular dysfunction of bladder: Secondary | ICD-10-CM | POA: Diagnosis not present

## 2014-10-05 DIAGNOSIS — E669 Obesity, unspecified: Secondary | ICD-10-CM | POA: Diagnosis not present

## 2014-10-05 DIAGNOSIS — I712 Thoracic aortic aneurysm, without rupture: Secondary | ICD-10-CM | POA: Diagnosis not present

## 2014-10-05 DIAGNOSIS — Q254 Other congenital malformations of aorta: Secondary | ICD-10-CM | POA: Diagnosis not present

## 2014-10-05 DIAGNOSIS — Z0000 Encounter for general adult medical examination without abnormal findings: Secondary | ICD-10-CM | POA: Diagnosis not present

## 2014-10-06 ENCOUNTER — Encounter: Payer: Self-pay | Admitting: Cardiovascular Disease

## 2014-10-07 DIAGNOSIS — Z1212 Encounter for screening for malignant neoplasm of rectum: Secondary | ICD-10-CM | POA: Diagnosis not present

## 2014-10-20 ENCOUNTER — Other Ambulatory Visit: Payer: Self-pay | Admitting: *Deleted

## 2014-10-20 DIAGNOSIS — R911 Solitary pulmonary nodule: Secondary | ICD-10-CM

## 2014-11-03 DIAGNOSIS — M778 Other enthesopathies, not elsewhere classified: Secondary | ICD-10-CM | POA: Diagnosis not present

## 2014-11-04 DIAGNOSIS — M25522 Pain in left elbow: Secondary | ICD-10-CM | POA: Diagnosis not present

## 2014-11-04 DIAGNOSIS — M7702 Medial epicondylitis, left elbow: Secondary | ICD-10-CM | POA: Diagnosis not present

## 2014-11-08 DIAGNOSIS — M25522 Pain in left elbow: Secondary | ICD-10-CM | POA: Diagnosis not present

## 2014-11-08 DIAGNOSIS — M7702 Medial epicondylitis, left elbow: Secondary | ICD-10-CM | POA: Diagnosis not present

## 2014-11-10 DIAGNOSIS — M25522 Pain in left elbow: Secondary | ICD-10-CM | POA: Diagnosis not present

## 2014-11-10 DIAGNOSIS — M7702 Medial epicondylitis, left elbow: Secondary | ICD-10-CM | POA: Diagnosis not present

## 2014-11-12 DIAGNOSIS — M25522 Pain in left elbow: Secondary | ICD-10-CM | POA: Diagnosis not present

## 2014-11-12 DIAGNOSIS — M7702 Medial epicondylitis, left elbow: Secondary | ICD-10-CM | POA: Diagnosis not present

## 2014-11-15 DIAGNOSIS — M7702 Medial epicondylitis, left elbow: Secondary | ICD-10-CM | POA: Diagnosis not present

## 2014-11-15 DIAGNOSIS — M25522 Pain in left elbow: Secondary | ICD-10-CM | POA: Diagnosis not present

## 2014-11-17 DIAGNOSIS — M7702 Medial epicondylitis, left elbow: Secondary | ICD-10-CM | POA: Diagnosis not present

## 2014-11-18 DIAGNOSIS — H31002 Unspecified chorioretinal scars, left eye: Secondary | ICD-10-CM | POA: Diagnosis not present

## 2014-11-18 DIAGNOSIS — H43813 Vitreous degeneration, bilateral: Secondary | ICD-10-CM | POA: Diagnosis not present

## 2014-11-18 DIAGNOSIS — H25813 Combined forms of age-related cataract, bilateral: Secondary | ICD-10-CM | POA: Diagnosis not present

## 2014-11-18 DIAGNOSIS — S0501XA Injury of conjunctiva and corneal abrasion without foreign body, right eye, initial encounter: Secondary | ICD-10-CM | POA: Diagnosis not present

## 2014-11-29 DIAGNOSIS — R911 Solitary pulmonary nodule: Secondary | ICD-10-CM | POA: Diagnosis not present

## 2014-11-29 LAB — CREATININE, SERUM: Creat: 0.95 mg/dL (ref 0.70–1.18)

## 2014-11-29 LAB — BUN: BUN: 17 mg/dL (ref 7–25)

## 2014-12-02 ENCOUNTER — Ambulatory Visit (INDEPENDENT_AMBULATORY_CARE_PROVIDER_SITE_OTHER): Payer: Medicare PPO | Admitting: Cardiothoracic Surgery

## 2014-12-02 ENCOUNTER — Encounter: Payer: Self-pay | Admitting: Cardiothoracic Surgery

## 2014-12-02 ENCOUNTER — Ambulatory Visit
Admission: RE | Admit: 2014-12-02 | Discharge: 2014-12-02 | Disposition: A | Discharge status: Home / Self Care | Payer: Medicare PPO | Source: Ambulatory Visit | Attending: Cardiothoracic Surgery | Admitting: Cardiothoracic Surgery

## 2014-12-02 VITALS — BP 132/84 | HR 74 | Resp 20 | Ht 79.0 in | Wt 274.0 lb

## 2014-12-02 DIAGNOSIS — I712 Thoracic aortic aneurysm, without rupture, unspecified: Secondary | ICD-10-CM

## 2014-12-02 DIAGNOSIS — I2510 Atherosclerotic heart disease of native coronary artery without angina pectoris: Secondary | ICD-10-CM | POA: Diagnosis not present

## 2014-12-02 DIAGNOSIS — R918 Other nonspecific abnormal finding of lung field: Secondary | ICD-10-CM

## 2014-12-02 DIAGNOSIS — R911 Solitary pulmonary nodule: Secondary | ICD-10-CM

## 2014-12-02 MED ORDER — IOPAMIDOL (ISOVUE-370) INJECTION 76%
75.0000 mL | Freq: Once | INTRAVENOUS | Status: AC | PRN
  Administered 2014-12-02: 75 mL via INTRAVENOUS

## 2014-12-02 NOTE — Progress Notes (Signed)
[address]       [place] [address]             [phone]                                                       Ronald [name] [facility] Medical Record #[id number] Date of Birth: Jan 21, 1941  Referring: Kathleene Hazel* Primary Care: Ezequiel Kayser, MD  Chief Complaint:    Dilated aorta   History of Present Illness:    Patient is a 74 year old male with known coronary occlusive disease having a stents placed in his right coronary artery 7 to 8 years ago. He is also known to have a dilated ascending aorta this is been followed since 2004 with serial CT scans. He returns today for follow up CTA  of the chest. Scans dating back to 2004 had been very consistent without change at 4.5 cm. The patient has no history of aortic insufficiency has no signs or symptoms of congestive heart failure he has no angina. There is no family history of aortic aneurysm or aortic dissections. His father died at age 54 in 5 sudden death while at a picnic attributed to "cardiac spasm". The patient does chew tobacco, previously smoked cigarettes more than 25 years ago for 1 year.   Recently he fell from a ladder and had to have pending of his right hip.  Current Activity/ Functional Status: Patient is independent with mobility/ambulation, transfers, ADL's, IADL's.   Past Medical History  Diagnosis Date  . Hypertension   . Hypercholesteremia     managed by dr Waynard Edwards  . 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   . Thoracic aortic aneurysm     last scan in Jan 2013. Now measuring 5.0cm. Referred to Dr. Tyrone Sage  . Malaria  patient had 3 episodes of malaria while serving in [place]   . SVT (supraventricular tachycardia)     remote episode during exercise test  . Lung nodules     noted on past CT scans    Past Surgical History  Procedure Laterality Date  . Cervical  fusion  03-2010    c2-c7  . Rotator cuff surgery  1997    lt  . Elbow surgery  1997    lt and rt  . Retinal detachment surgery  1975    lt  . Coronary stent placement  2004  . Tonsillectomy    . Eye surgery    . Colonoscopy      x4  . Shoulder arthroscopy  2/15    right-got cardiac clearance-gsc  . Trigger finger release Left 10/12/2013    Procedure: LEFT LONG FINGER TRIGGER RELEASE;  Surgeon: Jodi Marble, MD;  Location: [facility];  Service: Orthopedics;  Laterality: Left;  . Dental implant      x 2  . Hip pinning,cannulated Right 05/11/2014    Procedure: CANNULATED HIP PINNING;  Surgeon: Sheral Apley, MD;  Location[facility] OR;  Service: Orthopedics;  Laterality: Right;    Family History  Problem Relation Age of Onset  . Heart disease Father     History   Social History  .  Marital Status: Married    Spouse Name: N/A    Number of Children: 1  . Years of Education: N/A   Occupational History  . 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 Smoking status: Former Smoker    Quit date: 04/09/1969  . Smokeless tobacco: Former Neurosurgeon    Types: Chew    Quit date: 03/09/2010  . Alcohol Use: 0.5 oz/week    1 drink(s) per week     occasional  . Drug Use: No  . Sexually Active: Yes    Social History Narrative  .  patient's mother is currently undergoing treatment for metastatic terminal lung cancer     History  Smoking status  . Former Smoker  . Quit date: 04/09/1969  Smokeless tobacco  . Current User  . Types: Chew    Comment: reports that when he smoked it was intermittent    History  Alcohol Use  . 0.5 oz/week  . 1 Standard drinks or equivalent per week    Comment: occasional mainly during holiday     Allergies  Allergen Reactions  . Vesicare [Solifenacin Succinate] Itching and Rash  . Statins Other (See Comments)    myalgias  . Promethazine Hcl Other (See Comments)     Agitation and incoherence (phenergan)    Current Outpatient Prescriptions  Medication Sig Dispense Refill  . aspirin 325 MG tablet Take 325 mg by mouth daily after supper.     . Coenzyme Q10 (COQ-10) 200 MG CAPS Take 200 mg by mouth daily after supper.     . colesevelam (WELCHOL) 625 MG tablet Take 3,750 mg by mouth daily after supper. 6 tablets    . ezetimibe (ZETIA) 10 MG tablet Take 5 mg by mouth daily. Takes half tab of 10mg     . fluticasone (FLONASE) 50 MCG/ACT nasal spray Place 1 spray into the nose 2 (two) times daily as needed (seasonal allergies/ drainage).     . Glucosamine-Chondroit-Vit C-Mn (GLUCOSAMINE CHONDR 1500 COMPLX) CAPS Take 2 capsules by mouth daily after supper.     . hydrocortisone 2.5 % cream Apply 1 application topically as needed. Use on head    . lisinopril (PRINIVIL,ZESTRIL) 40 MG tablet Take 20 mg by mouth daily after supper. Takes half of 40mg  tab    . Multiple Vitamin (MULTIVITAMIN WITH MINERALS) TABS tablet Take 1 tablet by mouth daily after supper.    Marland Kitchen omeprazole (PRILOSEC) 20 MG capsule Take 20 mg by mouth daily after supper.     . Pitavastatin Calcium 4 MG TABS Take 2 mg by mouth daily after supper. Takes half of 4mg  tab    . bisacodyl (DULCOLAX) 5 MG EC tablet Take 5 mg by mouth daily as needed for moderate constipation (constipation). Took 3 tabs yesterday     No current facility-administered medications for this visit.       Review of Systems:     Cardiac Review of Systems: Y or N  Chest Pain [  n  ]  Resting SOB [ n  ] Exertional SOB  [ n ]  Orthopnea [n  ]   Pedal Edema [  mild ]    Palpitations [ n ] Syncope  [ n ]   Presyncope [ n  ]  General Review of Systems: [Y] = yes [  ]=no Constitional: recent weight change [  ]; anorexia [  ]; fatigue [  ]; nausea [  ];  night sweats [  ]; fever [  ]; or chills [  ];                                                                                                                                            Dental: poor dentition[ y caps]; Last Dentist visit: last 6 months  Eye : blurred vision [  ]; diplopia [   ]; vision changes [  ];  Amaurosis fugax[  ]; Resp: cough [  ];  wheezing[  ];  hemoptysis[  ]; shortness of breath[  ]; paroxysmal nocturnal dyspnea[  ]; dyspnea on exertion[  ]; or orthopnea[  ];  GI:  gallstones[  ], vomiting[  ];  dysphagia[  ]; melena[  ];  hematochezia [  ]; heartburn[  ];   Hx of  Colonoscopy[y  ]; GU: kidney stones [  ]; hematuria[  ];   dysuria [ y ];  nocturia[  y];  history of     obstruction [ n ];             Skin: rash, swelling[  ];, hair loss[  ];  peripheral edema[  ];  or itching[  ]; Musculosketetal: myalgias[  ];  joint swelling[  ];  joint erythema[  ];  joint pain[  ];  back pain[  ];  Heme/Lymph: bruising[  ];  bleeding[  ];  anemia[  ];  Neuro: TIA[  ];  headaches[  ];  stroke[  ];  vertigo[  ];  seizures[  ];   paresthesias[  ];  difficulty walking[  ];  Psych:depression[  ]; anxiety[  ];  Endocrine: diabetes[ n ];  thyroid dysfunction[ n ];  Immunizations: Flu [ y ]; Pneumococcal[y  ]; and shingles  Other:  Physical Exam: BP 132/84 mmHg  Pulse 74  Resp 20  Ht  (2.007 m)  Wt 274 lb (124.286 kg)  BMI 30.86 kg/m2  SpO2 97%  General appearance: alert, cooperative, appears stated age and no distress Neurologic: intact Heart: regular rate and rhythm, S1, S2 normal, no murmur, click, rub or gallop Lungs: clear to auscultation bilaterally Abdomen: soft, non-tender; bowel sounds normal; no masses,  no organomegaly Extremities: extremities normal, atraumatic, no cyanosis or edema, Homans sign is negative, no sign of DVT and varicose veins noted no adenopathy cervical or axillary Patient has large body stature with a body surface area of 2.6 however he has no features of Marfan's,  Diagnostic Studies & Laboratory data:     Recent Radiology Findings: Ct Angio Chest Aorta W/cm &/or Wo/cm  12/02/2014   CLINICAL DATA:  Follow-up lung  nodule and thoracic aortic aneurysm, ex-smoker.  EXAM: CT ANGIOGRAPHY CHEST WITH CONTRAST  TECHNIQUE: Multidetector CT imaging of the chest was performed using the standard protocol during bolus administration of intravenous contrast. Multiplanar CT image reconstructions and MIPs were obtained to  evaluate the vascular anatomy.  CONTRAST:  75 cc of Isovue 370  COMPARISON:  Chest CTs dated 08/06/2014, 12/03/2013, and 11/13/2012.  FINDINGS: The benign 4 mm pulmonary nodule within the left lateral lingula is stable. The 5 mm pulmonary nodule within the medial aspects of the superior segment of the right lower lobe is stable compared to the chest CT of 08/06/2014 and is also stable compared to the earlier study of 11/13/2012 indicating benignity.  There is a new 6 mm pulmonary nodule within the lateral aspects of the right lower lobe and there is a new 5 mm pulmonary nodule within the medial aspects of the right lower lobe (series 5, images 41 and 40 respectively). There is an additional questionable pulmonary nodule within the right lower lobe posteriorly measuring 7 mm (series 5, image 38). No other new lung findings seen.  The dilatation of the ascending thoracic aorta is unchanged at 4.6 cm diameter. Distal thoracic aortic arch is also again mildly prominent measuring 3.7 cm diameter, unchanged. No aortic dissection.  Heart size is upper normal, unchanged. Coronary artery calcifications noted within the left anterior descending and left circumflex coronary arteries. No mass or enlarged lymph nodes seen within the mediastinum or perihilar regions. Supraclavicular regions are unremarkable. Limited images of the upper abdomen are unremarkable. Degenerative changes within the thoracic spine are unchanged. No acute osseous abnormality.  Review of the MIP images confirms the above findings.  IMPRESSION: 1. New pulmonary nodules within the right lower lobe, as detailed above. These include a new 6 mm pulmonary nodule within  the lateral aspects of the right lower lobe and a new 5 mm pulmonary nodule within the medial aspects of the right lower lobe. There is also a questionable pulmonary nodule within the right lower lobe posteriorly measuring 7 mm greatest dimension. As the patient is at high risk for bronchogenic carcinoma, follow-up chest CT at 3-9months is recommended. This recommendation follows the consensus statement: Guidelines for Management of Small Pulmonary Nodules Detected on CT Scans: A Statement from the Fleischner Society as published in Radiology 2005; 237:395-400. 2. Remainder of the pulmonary nodules have been stable for at least 2 years indicating benignity. 3. Stable ascending thoracic aortic aneurysm measuring 4.6 cm diameter. 4. Coronary artery calcifications.   Electronically Signed   By: Bary Richard M.D.   On: 12/02/2014 14:26  I have independently reviewed the above radiology studies  and reviewed the findings with the patient.   12/03/2013   CLINICAL DATA:  Thoracic aortic aneurysm.  EXAM: CT ANGIOGRAPHY CHEST WITH CONTRAST  TECHNIQUE: Multidetector CT imaging of the chest was performed using the standard protocol during bolus administration of intravenous contrast. Multiplanar CT image reconstructions and MIPs were obtained to evaluate the vascular anatomy.  CONTRAST:  80mL OMNIPAQUE IOHEXOL 350 MG/ML SOLN  COMPARISON:  CT scan of November 13, 2012 and December 26, 2011.  FINDINGS: No pneumothorax or pleural effusion is noted. Stable 4.5 mm nodule is noted in left lingula laterally which is unchanged compared with 2013 and can be considered benign at this point. 4.8 cm nodule is noted in the medial portion of the superior segment of the right lower lobe best seen on image number 26 of series 5. This is unchanged compared to prior exam. No acute pulmonary disease is noted. Coronary artery calcifications are again noted. No significant mediastinal mass or adenopathy is noted. Ascending thoracic aorta has  maximum measured diameter 4.6 cm which is not changed compared to prior exam. No dissection  or significant atheromatous disease is noted. Normal caliber of descending thoracic aorta is noted. Visualized portion of upper abdomen appears normal. The great vessels appear to be widely patent without significant stenosis. No significant osseous abnormality is noted.  Review of the MIP images confirms the above findings.  IMPRESSION: 4.6 cm ascending thoracic aortic aneurysm is noted which is unchanged compared to prior exam.  Coronary artery calcifications are noted consistent with coronary artery disease.  Stable stable 4.8 cm nodule seen in superior segment of right lower lobe ; followup chest CT in 12 months is recommended to ensure stability.   Electronically Signed   By: Roque Lias M.D.   On: 12/03/2013 14:47   I have discussed with radiology and reviewed the films, should be 4.8 mm not cm nodule  Ct Angio Chest Aorta W/cm &/or Wo/cm  11/13/2012   *RADIOLOGY REPORT*  Clinical Data: Aortic aneurysm  CT ANGIOGRAPHY CHEST  Technique:  Multidetector CT imaging of the chest using the standard protocol during bolus administration of intravenous contrast. Multiplanar reconstructed images including MIPs were obtained and reviewed to evaluate the vascular anatomy.  Contrast: OMNIPAQUE IOHEXOL 350 MG/ML SOLN  Comparison: 12/26/2011  Findings: Aortic diameter at the sinus of Valsalva, sinotubular junction, and ascending aorta are 5.0 cm, 3.7 cm, and 4.6 cm respectively.  No evidence of aortic dissection or transection. Little if any plaque within the thoracic aorta.  Innominate artery, right vertebral artery, right subclavian artery, right common carotid artery, left common carotid artery, left subclavian artery, and left vertebral artery are all widely patent within the confines of the examination.  Right coronary artery stent is noted.  Calcification in the circumflex and left anterior descending coronary vessels  are noted. Distal right coronary artery calcifications are noted.  No obvious filling defects in the pulmonary arterial tree to suggest acute pulmonary thromboembolism.  Negative abnormal mediastinal adenopathy.  No pericardial effusion.  Sub centimeter right thyroid hypodensity on image 17 of series 4.  No pneumothorax.  No pleural effusion.  Calcified lymph nodes in the mediastinum or are noted.  Sub centimeter pulmonary nodules are stable.  Cervical spine fusion hardware is in place.  The right-sided screw within the C7 vertebral body has retracted and is within the prevertebral soft tissues.  Images of the upper abdomen demonstrate diffuse hepatic steatosis. Prominence of the adrenal glands without focal mass is stable.  IMPRESSION: Maximal diameter of the ascending aorta is 4.6 cm.  Previously, it was measured at 4.9 cm.  It is therefore not enlarging.  No evidence of aortic dissection.  Sub centimeter right thyroid hypodensity is stable in retrospect compared with a study from 2012.  This supports benign etiology.  Cervical spine fusion hardware is in place.  One of the screws in C7 has retracted and is residing within the prevertebral soft tissues.   Original Report Authenticated By: Jolaine Click, M.D.    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  Follow-up study.  CT ANGIOGRAPHY OF THE HEART, CORONARY ARTERY, STRUCTURE, AND MORPHOLOGY  CONTRAST:  100 ml of Omnipaque-300.  COMPARISON:  CTA chest 05/14/2011.  TECHNIQUE:  CT angiography of the coronary vessels was performed on a 256 channel system using prospective ECG gating.  A scout and noncontrast exam (for calcium scoring) were performed.  Circulation time was measured using a test bolus.  Coronary CTA was performed  with sub mm slice collimation during portions of the cardiac cycle after prior injection of iodinated  contrast.  Imaging post processing was performed on an independent workstation creating multiplanar and 3-D images, and quantitative analysis of the heart and coronary arteries.  Note that this exam targets the heart and the chest was not imaged in its entirety.  PREMEDICATION: Lopressor 5 mg, IV Nitroglycerin 400 mcg, sublingual.  FINDINGS: Technical quality:  Good  Heart rate:  55-60  CORONARY ARTERIES: Left main coronary artery:  Not present (there are separate ostia for the left anterior descending and left circumflex coronary arteries directly off the left sinus of Valsalva). Left anterior descending:  Mildly diseased with mixed calcified noncalcified atherosclerotic plaque with areas of stenosis up to 25 - 50% in the mid LAD. Left circumflex:  Large caliber vessel that quickly gives rise to two large obtuse marginal vessels.  Proximal left circumflex demonstrates only minimal calcified plaque but no associated stenosis. Obtuse Marginal 1: Mildly diseased with calcified plaque with no associated luminal stenosis. Obtuse Marginal 2: Extensive calcified atherosclerotic plaque proximally with stenosis >50% (possibly >75%), however, secondary to the high degree of calcification, accurate assessment for luminal stenosis is not possible on this examination. Right coronary artery:  There is a stent in the proximal right coronary artery which is grossly patent.  There is some calcified plaque at the distal end of the stent where the vessel is slightly acutely angulated.  There appears to be some stenosis in this region, however, it is favored to be approximately 25 - 50% (accurate assessment is limited by the high-density the stent material and the heavily calcified plaque).  The remainder of the RCA is otherwise mildly diseased with nonobstructive plaque. Posterior descending artery:  Patent with a mixed calcified noncalcified plaque.  Secondary to the small size of the vessel and excessive image noise, accurate  assessment for stenosis is not possible on this examination, however, there appears to be at least 25 - 50% stenosis of the mid PDA. Dominance:  Right  CORONARY CALCIUM: Total Agatston Score:  655 MESA database percentile:  80th Comment - it appears as though the patient has moved or breathed during image acquisition, such that the a portion of the coronary arteries was incompletely visualized.  This likely underestimates the patient's true calcium score.  AORTA AND PULMONARY MEASUREMENTS: Aortic root (21 - 40 mm):             29 mm  at the annulus             49 mm  at the sinuses of Valsalva         36 mm  at the sinotubular junction Ascending aorta ( <  40 mm):  50 mm Aortic arch : 34 mm Descending aorta:  28 mm Main pulmonary artery:  ( <  30 mm):  35 mm  EXTRACARDIAC FINDINGS: Several tiny pulmonary nodules measuring 4 mm are scattered throughout the lungs bilaterally and are completely unchanged compared to prior examinations (dating back to 2007); these can be considered radiographically benign requiring no further imaging follow-up.  Small calcified granulomas are also noted in the posterior aspect of the left upper lobe.  Multiple calcified left hilar and mediastinal lymph nodes are noted.  No acute consolidative airspace disease.  No pleural effusions.  Visualized portions of the upper abdomen are unremarkable.  IMPRESSION:  1. Ascending thoracic aortic aneurysm is similar in size measuring 4.9 cm on today's examination.  No evidence  of thoracic aortic dissection at this time. 2.  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  The patient's aortic valve appears to be tricuspid.  Additionally, there is no effacement of the sinotubular junction. 3.  Multivessel coronary artery disease, as detailed above.  Most importantly, there is a heavily calcified plaque in the obtuse marginal 2 branch (a large vessel) which appears to be stenotic (i.e., at least  50% diameter stenosis, and possbily in excess of 75%), however, accurate assessment for percentage stenosis is limited on this examination secondary to the high degree of calcification. Clinical correlation is recommended, with consideration for further evaluation with stress testing if there is clinical concern for inducible ischemia. 4.  Coronary artery calcium scoring is at least 655 (the patient moved during the image acquisition, resulting in incomplete coverage of the coronary arteries which likely underestimates the true calcium score), which is 80th percentile for patient's of matched age, gender and race/ethnicity. 5.  Right coronary artery dominance.   Original Report Authenticated By: Florencia Reasons, M.D.     Recent Lab Findings: Lab Results  Component Value Date   WBC 12.4* 08/06/2014   HGB 15.3 08/06/2014   HCT 45.0 08/06/2014   PLT 234 08/06/2014   GLUCOSE 141* 08/06/2014   CHOL 184 11/14/2010   TRIG 143.0 11/14/2010   HDL 48.60 11/14/2010   LDLCALC 107* 11/14/2010   ALT 16 08/06/2014   AST 22 08/06/2014   NA 140 08/06/2014   K 4.4 08/06/2014   CL 104 08/06/2014   CREATININE 0.95 11/29/2014   BUN 17 11/29/2014   CO2 25 08/06/2014   INR 1.0 06/06/2011    Procedure Performed:  1. Left Heart Catheterization 2. Selective Coronary Angiography 3. Left ventricular angiogram 4. Aortic root angiogram Operator: Verne Carrow, MD  Indication: Pt with known CAD and thoracic aortic aneurysm. The aortic aneurysm has enlarged over the last year and we recently referred Ronald [name] to see Dr. Tyrone Sage. Plans are being made for aortic root replacement. Cardiac cath today to exclude progression of CAD.  Procedure Details:  The risks, benefits, complications, treatment options, and expected outcomes were discussed with the patient. The patient and/or family concurred with the proposed plan, giving informed consent. The patient was brought to the cath lab after IV hydration was  begun and oral premedication was given. The patient was further sedated with Versed and Fentanyl. The right groin was prepped and draped in the usual manner. Using the modified Seldinger access technique, a 4 French sheath was placed in the right femoral artery. A JL-5 catheter was used to engage the LAD with sub-selective shots of the Circumflex. An AL-1 catheter was used to non-selectively inject the Circumflex artery. A 3DRC catheter was used to engage and inject the RCA. A pigtail catheter was used to perform a left ventricular angiogram. The catheter was pulled back across the aortic valve. An aortic root angiogram was performed.  There were no immediate complications. The patient was taken to the recovery area in stable condition.  Hemodynamic Findings:  Central aortic pressure: 130/72  Left ventricular pressure: 130/17/24  Angiographic Findings:  Left main: There are separate ostia for the LAD and Circumflex.  Left Anterior Descending Artery: Large vessel that courses to the apex. No obstructive disease noted.Small diagonal branch.  Circumflex Artery: Large caliber vessel with no obstructive disease noted.  Right Coronary Artery: Very large, dominant vessel with patent proximal stent. There is mild plaque  disease in the mid and distal RCA. The small caliber PDA has 60% stenosis.  Left Ventricular Angiogram: Dilated LV. LVEF=50%.  Aortic root: Dilated aortic root and ascending aorta.  Impression:  1. 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 Preserved LV systolic function  3. 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     Aortic Size Index=     4.6    /Body surface area is 2.63 meters squared. = 1.8  < 2.75 cm/m2      4% risk per year 2.75 to 4.25          8% risk per year > 4.25 cm/m2    20% risk per year  cross sectional area of aorta cm2/height in meters > 10 consider  surgery 16.6/2= 8.3    Assessment / Plan:  New pulmonary nodules within the right lower lobe. Noted   include a new 6 mm pulmonary nodule within the lateral aspects of the right lower lobe and a new 5 mm pulmonary nodule within the medial aspects of the right lower lobe. Known dilated ascending aorta since at least 2004,   The symptoms,  risks and lethal nature of acute aortic dissection was explained to him and his wife.  No beta blocker per cardiology    I plan to see him back in 6  months with repeat CT of chest without contrast to follow both the size of the aorta and the small pulmonary nodules    Delight Ovens MD  Beeper [phone] Office [phone] 12/02/2014 3:20 PM

## 2014-12-24 DIAGNOSIS — L718 Other rosacea: Secondary | ICD-10-CM | POA: Diagnosis not present

## 2015-01-18 ENCOUNTER — Encounter: Payer: Self-pay | Admitting: Cardiovascular Disease

## 2015-01-18 ENCOUNTER — Ambulatory Visit (INDEPENDENT_AMBULATORY_CARE_PROVIDER_SITE_OTHER): Payer: Medicare PPO | Admitting: Cardiovascular Disease

## 2015-01-18 VITALS — BP 120/87 | HR 74 | Ht 79.0 in | Wt 280.0 lb

## 2015-01-18 DIAGNOSIS — R0600 Dyspnea, unspecified: Secondary | ICD-10-CM

## 2015-01-18 DIAGNOSIS — E785 Hyperlipidemia, unspecified: Secondary | ICD-10-CM | POA: Diagnosis not present

## 2015-01-18 DIAGNOSIS — I2510 Atherosclerotic heart disease of native coronary artery without angina pectoris: Secondary | ICD-10-CM | POA: Diagnosis not present

## 2015-01-18 DIAGNOSIS — I712 Thoracic aortic aneurysm, without rupture, unspecified: Secondary | ICD-10-CM

## 2015-01-18 DIAGNOSIS — I10 Essential (primary) hypertension: Secondary | ICD-10-CM

## 2015-01-18 NOTE — Patient Instructions (Signed)
Medication Instructions:  Your physician recommends that you continue on your current medications as directed. Please refer to the Current Medication list given to you today.   Labwork: none  Testing/Procedures: Your physician has requested that you have an echocardiogram. Echocardiography is a painless test that uses sound waves to create images of your heart. It provides your doctor with information about the size and shape of your heart and how well your heart's chambers and valves are working. This procedure takes approximately one hour. There are no restrictions for this procedure.    Follow-Up: Your physician wants you to follow-up in: 6 months.  You will receive a reminder letter in the mail two months in advance. If you don't receive a letter, please call our office to schedule the follow-up appointment.    Any Other Special Instructions Will Be Listed Below (If Applicable).   

## 2015-01-18 NOTE — Progress Notes (Signed)
Chief Complaint  Patient presents with  . Shortness of Breath      History of Present Illness:74 yo WM with h/o CAD s/p stent RCA 2004, HTN, HLD, dilated aortic root (Ascending aortic aneurysm) here today for cardiac follow up. He has been followed in the past by Dr. Deborah Chalk. Echo August 2012 with LVEF 55-60%. CTA chest February 2013 with enlargement of thoracic aorta. He is followed yearly by Dr. Tyrone Sage with yearly CTA chest. Last Chest CTA August 2016 with maximal diameter of ascending aorta 4.6 cm, stable. He has not tolerated statins due to muscle aches. This is followed in primary care. No beta blocker secondary to bradycardia. Right hip fracture February 2016 now s/p repair.   He tells me today that he has had no exertional chest pain, dizziness, near syncope or syncope. He has been active. He has SOB with heavy exertion.   Primary Care Physician: [name]   Last Lipid Profile: Followed in primary care  Past Medical History  Diagnosis Date  . Hypertension   . Hypercholesteremia     managed by dr Waynard Edwards  . 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   . Thoracic aortic aneurysm [facility])     last scan in Jan 2013. Now measuring 5.0cm. Referred to Dr. Tyrone Sage  . Malaria   . SVT (supraventricular tachycardia) (HCC)     remote episode during exercise test  . Lung nodules     noted on past CT scans  . Hx of Bell's palsy     left face  . Wears glasses   . Wears dentures     bottom  . History of pneumonia     74 years old  . GERD (gastroesophageal reflux disease)   . Arthritis   . Coronary artery disease     Stent 2004    Past Surgical History  Procedure Laterality Date  . Cervical fusion  03-2010    c2-c7  . Rotator cuff surgery  1997    lt  . Elbow surgery  1997    lt and rt  . Retinal detachment surgery  1975    lt  . Coronary stent placement  2004  . Tonsillectomy     . Eye surgery    . Colonoscopy      x4  . Shoulder arthroscopy  2/15    right-got cardiac clearance-gsc  . Trigger finger release Left 10/12/2013    Procedure: LEFT LONG FINGER TRIGGER RELEASE;  Surgeon: Jodi Marble, MD;  Location: [facility];  Service: Orthopedics;  Laterality: Left;  . Dental implant      x 2  . Hip pinning,cannulated Right 05/11/2014    Procedure: CANNULATED HIP PINNING;  Surgeon: Sheral Apley, MD;  Location: [facility];  Service: Orthopedics;  Laterality: Right;    Current Outpatient Prescriptions  Medication Sig Dispense Refill  . aspirin 325 MG tablet Take 325 mg by mouth daily after supper.     . Coenzyme Q10 (COQ-10) 200 MG CAPS Take 200 mg by mouth daily after supper.     . colesevelam (WELCHOL) 625 MG tablet Take 3,750 mg by mouth daily after supper. 6 tablets    . ezetimibe (ZETIA) 10 MG tablet Take 5 mg by mouth daily. Takes half tab of 10mg     . fluticasone (FLONASE) 50 MCG/ACT nasal spray Place 1  spray into the nose 2 (two) times daily as needed (seasonal allergies/ drainage).     . Glucosamine-Chondroit-Vit C-Mn (GLUCOSAMINE CHONDR 1500 COMPLX) CAPS Take 2 capsules by mouth daily after supper.     . hydrocortisone 2.5 % cream Apply 1 application topically as needed (RASH). Use on head    . lisinopril (PRINIVIL,ZESTRIL) 40 MG tablet Take 20 mg by mouth daily after supper. Takes half of  tab    . metroNIDAZOLE (METROGEL) 0.75 % gel Apply 1 application topically 2 (two) times daily.     . Multiple Vitamin (MULTIVITAMIN WITH MINERALS) TABS tablet Take 1 tablet by mouth daily after supper.    Marland Kitchen omeprazole (PRILOSEC) 20 MG capsule Take 20 mg by mouth daily after supper.     . Pitavastatin Calcium 4 MG TABS Take 2 mg by mouth daily after supper. Takes half of  tab     No current facility-administered medications for this visit.    Allergies  Allergen Reactions  . Vesicare [Solifenacin Succinate] Itching and Rash  . Statins Other (See  Comments)    myalgias  . Promethazine Hcl Other (See Comments)    Agitation and incoherence (phenergan)    Social History   Social History  . Marital Status: Married    Spouse Name: N/A  . Number of Children: 1  . Years of Education: N/A   Occupational History  . retired Engineer, site    Social History Main Topics  . Smoking status: Former Smoker    Quit date: 04/09/1969  . Smokeless tobacco: Current User    Types: Chew     Comment: reports that when he smoked it was intermittent  . Alcohol Use: 0.5 oz/week    1 Standard drinks or equivalent per week     Comment: occasional mainly during holiday  . Drug Use: No  . Sexual Activity: Yes   Other Topics Concern  . Not on file   Social History Narrative    Family History  Problem Relation Age of Onset  . Heart disease Father   . Heart attack Father     76  . Hypertension Neg Hx   . Stroke Neg Hx   . Cancer Mother 53    LUNG    Review of Systems:  As stated in the HPI and otherwise negative.   BP 120/87 mmHg  Pulse 74  Ht  (2.007 m)  Wt 280 lb (127.007 kg)  BMI 31.53 kg/m2  Physical Examination: General: Well developed, well nourished, NAD HEENT: OP clear, mucus membranes moist SKIN: warm, dry. No rashes. Neuro: No focal deficits Musculoskeletal: Muscle strength 5/5 all ext Psychiatric: Mood and affect normal Neck: No JVD, no carotid bruits, no thyromegaly, no lymphadenopathy. Lungs:Clear bilaterally, no wheezes, rhonci, crackles Cardiovascular: Regular rate and rhythm. No murmurs, gallops or rubs. Abdomen:Soft. Bowel sounds present. Non-tender.  Extremities: No lower extremity edema. Pulses are 2 + in the bilateral DP/PT.  Cardiac cath 06/08/11: Left main: There are separate ostia for the LAD and Circumflex.  Left Anterior Descending Artery: Large vessel that courses to the apex. No obstructive disease noted.Small diagonal branch.  Circumflex Artery: Large caliber vessel with no obstructive disease  noted.  Right Coronary Artery: Very large, dominant vessel with patent proximal stent. There is mild plaque disease in the mid and distal RCA. The small caliber PDA has 60% stenosis.  Left Ventricular Angiogram: Dilated LV. LVEF=50%.  Aortic root: Dilated aortic root and ascending aorta.  Impression:  1. Single vessel  CAD with patent stent RCA  2. Preserved LV systolic function  3. 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   EKG:  EKG is ordered today. The ekg ordered today demonstrates NSR, rate 74 bpm. Incomplete RBBB  Recent Labs: 08/06/2014: ALT 16; Hemoglobin 15.3; Platelets 234; Potassium 4.4; Sodium 140 11/29/2014: BUN 17; Creat 0.95   Lipid Panel Followed in primary care   Wt Readings from Last 3 Encounters:  01/18/15 280 lb (127.007 kg)  12/02/14 274 lb (124.286 kg)  08/06/14 268 lb (121.564 kg)     Other studies Reviewed: Additional studies/ records that were reviewed today include: . Review of the above records demonstrates:    Assessment and Plan:   1. CAD: He has no angina. Last cath March 2013 with stable disease. Will continue ASA, statin. No beta blocker due to bradycardia.   2. Thoracic Aortic aneurysm: Followed by Dr. Tyrone Sage. No plans for surgical intervention as this has been stable for years. No beta blocker at this time secondary to bradycardia. BP is well controlled.   3. HTN: BP well controlled. No changes today.   4. HLD: Managed in primary care. Continue statin.   5 Dyspnea: Will arrange echo to assess LV function and exclude valve disease.   Current medicines are reviewed at length with the patient today.  The patient does not have concerns regarding medicines.  The following changes have been made:  no change  Labs/ tests ordered today include:   Orders Placed This Encounter  Procedures  . EKG 12-Lead  . Echocardiogram    Disposition:   FU with me in 6 months  Signed, Verne Carrow, MD 01/18/2015 5:13 PM    [facility]  [facility] [address] [place], [place], [place]  [address] Phone: [phone]; Fax: [phone]

## 2015-01-25 ENCOUNTER — Other Ambulatory Visit: Payer: Self-pay

## 2015-01-25 ENCOUNTER — Ambulatory Visit (HOSPITAL_COMMUNITY): Payer: Medicare PPO | Attending: Internal Medicine

## 2015-01-25 DIAGNOSIS — I071 Rheumatic tricuspid insufficiency: Secondary | ICD-10-CM | POA: Diagnosis not present

## 2015-01-25 DIAGNOSIS — I77810 Thoracic aortic ectasia: Secondary | ICD-10-CM | POA: Insufficient documentation

## 2015-01-25 DIAGNOSIS — R0600 Dyspnea, unspecified: Secondary | ICD-10-CM | POA: Diagnosis not present

## 2015-01-25 DIAGNOSIS — I517 Cardiomegaly: Secondary | ICD-10-CM | POA: Diagnosis not present

## 2015-01-25 DIAGNOSIS — Z87891 Personal history of nicotine dependence: Secondary | ICD-10-CM | POA: Diagnosis not present

## 2015-02-17 ENCOUNTER — Other Ambulatory Visit: Payer: Self-pay | Admitting: Internal Medicine

## 2015-02-17 DIAGNOSIS — M545 Low back pain: Secondary | ICD-10-CM | POA: Diagnosis not present

## 2015-02-17 DIAGNOSIS — R109 Unspecified abdominal pain: Secondary | ICD-10-CM

## 2015-02-17 DIAGNOSIS — Z6831 Body mass index (BMI) 31.0-31.9, adult: Secondary | ICD-10-CM | POA: Diagnosis not present

## 2015-02-17 DIAGNOSIS — R350 Frequency of micturition: Secondary | ICD-10-CM | POA: Diagnosis not present

## 2015-02-17 DIAGNOSIS — M25562 Pain in left knee: Secondary | ICD-10-CM | POA: Diagnosis not present

## 2015-02-17 DIAGNOSIS — N401 Enlarged prostate with lower urinary tract symptoms: Secondary | ICD-10-CM | POA: Diagnosis not present

## 2015-02-17 DIAGNOSIS — M25561 Pain in right knee: Secondary | ICD-10-CM | POA: Diagnosis not present

## 2015-02-24 ENCOUNTER — Ambulatory Visit
Admission: RE | Admit: 2015-02-24 | Discharge: 2015-02-24 | Disposition: A | Discharge status: Home / Self Care | Payer: Medicare PPO | Source: Ambulatory Visit | Attending: Internal Medicine | Admitting: Internal Medicine

## 2015-02-24 DIAGNOSIS — R109 Unspecified abdominal pain: Secondary | ICD-10-CM | POA: Diagnosis not present

## 2015-02-24 DIAGNOSIS — K760 Fatty (change of) liver, not elsewhere classified: Secondary | ICD-10-CM | POA: Diagnosis not present

## 2015-02-24 MED ORDER — IOPAMIDOL (ISOVUE-300) INJECTION 61%
125.0000 mL | Freq: Once | INTRAVENOUS | Status: AC | PRN
  Administered 2015-02-24: 125 mL via INTRAVENOUS

## 2015-02-25 ENCOUNTER — Other Ambulatory Visit: Payer: Self-pay | Admitting: Internal Medicine

## 2015-02-25 DIAGNOSIS — N289 Disorder of kidney and ureter, unspecified: Secondary | ICD-10-CM

## 2015-03-17 DIAGNOSIS — L218 Other seborrheic dermatitis: Secondary | ICD-10-CM | POA: Diagnosis not present

## 2015-03-17 DIAGNOSIS — L718 Other rosacea: Secondary | ICD-10-CM | POA: Diagnosis not present

## 2015-03-28 ENCOUNTER — Other Ambulatory Visit: Payer: Self-pay | Admitting: Internal Medicine

## 2015-03-28 DIAGNOSIS — M545 Low back pain: Secondary | ICD-10-CM

## 2015-03-28 DIAGNOSIS — M546 Pain in thoracic spine: Secondary | ICD-10-CM

## 2015-04-08 ENCOUNTER — Ambulatory Visit
Admission: RE | Admit: 2015-04-08 | Discharge: 2015-04-08 | Disposition: A | Discharge status: Home / Self Care | Payer: Medicare PPO | Source: Ambulatory Visit | Attending: Internal Medicine | Admitting: Internal Medicine

## 2015-04-08 DIAGNOSIS — M4806 Spinal stenosis, lumbar region: Secondary | ICD-10-CM | POA: Diagnosis not present

## 2015-04-08 DIAGNOSIS — M545 Low back pain: Secondary | ICD-10-CM

## 2015-04-08 DIAGNOSIS — M546 Pain in thoracic spine: Secondary | ICD-10-CM

## 2015-04-08 DIAGNOSIS — M5134 Other intervertebral disc degeneration, thoracic region: Secondary | ICD-10-CM | POA: Diagnosis not present

## 2015-05-03 ENCOUNTER — Other Ambulatory Visit: Payer: Self-pay | Admitting: Cardiothoracic Surgery

## 2015-05-03 DIAGNOSIS — R918 Other nonspecific abnormal finding of lung field: Secondary | ICD-10-CM

## 2015-05-19 ENCOUNTER — Ambulatory Visit: Payer: Medicare PPO | Admitting: Cardiothoracic Surgery

## 2015-05-19 ENCOUNTER — Other Ambulatory Visit: Payer: Medicare PPO

## 2015-05-26 ENCOUNTER — Ambulatory Visit (INDEPENDENT_AMBULATORY_CARE_PROVIDER_SITE_OTHER): Payer: Medicare Other | Admitting: Cardiothoracic Surgery

## 2015-05-26 ENCOUNTER — Encounter: Payer: Self-pay | Admitting: Cardiothoracic Surgery

## 2015-05-26 ENCOUNTER — Ambulatory Visit
Admission: RE | Admit: 2015-05-26 | Discharge: 2015-05-26 | Disposition: A | Discharge status: Home / Self Care | Payer: Medicare Other | Source: Ambulatory Visit | Attending: Cardiothoracic Surgery | Admitting: Cardiothoracic Surgery

## 2015-05-26 VITALS — BP 133/83 | HR 97 | Resp 16 | Ht 79.0 in | Wt 271.0 lb

## 2015-05-26 DIAGNOSIS — I712 Thoracic aortic aneurysm, without rupture, unspecified: Secondary | ICD-10-CM

## 2015-05-26 DIAGNOSIS — R918 Other nonspecific abnormal finding of lung field: Secondary | ICD-10-CM

## 2015-05-26 NOTE — Progress Notes (Signed)
[address]       [place] [address]             [phone]                                                       Ronald [name] [facility] Medical Record #[id number] Date of Birth: 26-May-1940  Referring: Kathleene Hazel* Primary Care: Ezequiel Kayser, MD  Chief Complaint:    Dilated aorta   History of Present Illness:    Patient is a 75 year old male with known coronary occlusive disease having a stents placed in his right coronary artery 12  years ago. He is also known to have a dilated ascending aorta this is been followed since 2004 with serial CT scans. He returns today for follow up CTA  of the chest. Scans dating back to 2004 had been very consistent without change at 4.5 cm. The patient has no history of aortic insufficiency has no signs or symptoms of congestive heart failure he has no angina. There is no family history of aortic aneurysm or aortic dissections. His father died at age 38 in 77 sudden death while at a picnic attributed to "cardiac spasm". The patient does chew tobacco, previously smoked cigarettes more than 25 years ago for 1 year.   His primary complaint remains chronic back pain and knee pain making it difficult to be active.  Current Activity/ Functional Status: Patient is independent with mobility/ambulation, transfers, ADL's, IADL's.   Past Medical History  Diagnosis Date  . Hypertension   . Hypercholesteremia     managed by dr Waynard Edwards  . 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   . Thoracic aortic aneurysm     last scan in Jan 2013. Now measuring 5.0cm. Referred to Dr. Tyrone Sage  . Malaria  patient had 3 episodes of malaria while serving in [place]   . SVT (supraventricular tachycardia)     remote episode during exercise test  . Lung nodules     noted on past CT scans    Past Surgical History  Procedure Laterality  Date  . Cervical fusion  03-2010    c2-c7  . Rotator cuff surgery  1997    lt  . Elbow surgery  1997    lt and rt  . Retinal detachment surgery  1975    lt  . Coronary stent placement  2004  . Tonsillectomy    . Eye surgery    . Colonoscopy      x4  . Shoulder arthroscopy  2/15    right-got cardiac clearance-gsc  . Trigger finger release Left 10/12/2013    Procedure: LEFT LONG FINGER TRIGGER RELEASE;  Surgeon: Jodi Marble, MD;  Location: [facility];  Service: Orthopedics;  Laterality: Left;  . Dental implant      x 2  . Hip pinning,cannulated Right 05/11/2014    Procedure: CANNULATED HIP PINNING;  Surgeon: Sheral Apley, MD;  Location: [facility] OR;  Service: Orthopedics;  Laterality: Right;    Family History  Problem Relation Age of Onset  . Heart disease Father   . Heart attack Father  45  . Hypertension Neg Hx   . Stroke Neg Hx   . Cancer Mother 60    LUNG    History   Social History  . Marital Status: Married    Spouse Name: N/A    Number of Children: 1  . Years of Education: N/A   Occupational History  . 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 Smoking status: Former Smoker    Quit date: 04/09/1969  . Smokeless tobacco: Former Neurosurgeon    Types: Chew    Quit date: 03/09/2010  . Alcohol Use: 0.5 oz/week    1 drink(s) per week     occasional  . Drug Use: No  . Sexually Active: Yes    Social History Narrative       History  Smoking status  . Former Smoker  . Quit date: 04/09/1969  Smokeless tobacco  . Current User  . Types: Chew    Comment: reports that when he smoked it was intermittent    History  Alcohol Use  . 0.5 oz/week  . 1 Standard drinks or equivalent per week    Comment: occasional mainly during holiday     Allergies  Allergen Reactions  . Vesicare [Solifenacin Succinate] Itching and Rash  . Statins Other (See Comments)    myalgias  . Promethazine  Hcl Other (See Comments)    Agitation and incoherence (phenergan)    Current Outpatient Prescriptions  Medication Sig Dispense Refill  . aspirin 325 MG tablet Take 325 mg by mouth daily after supper.     . Coenzyme Q10 (COQ-10) 200 MG CAPS Take 200 mg by mouth daily after supper.     . colesevelam (WELCHOL) 625 MG tablet Take 3,750 mg by mouth daily after supper. 6 tablets    . ezetimibe (ZETIA) 10 MG tablet Take 5 mg by mouth daily. Takes half tab of     . fluticasone (FLONASE) 50 MCG/ACT nasal spray Place 1 spray into the nose 2 (two) times daily as needed (seasonal allergies/ drainage).     . Glucosamine-Chondroit-Vit C-Mn (GLUCOSAMINE CHONDR 1500 COMPLX) CAPS Take 2 capsules by mouth daily after supper.     . hydrocortisone 2.5 % cream Apply 1 application topically as needed (RASH). Use on head    . lisinopril (PRINIVIL,ZESTRIL) 40 MG tablet Take 40 mg by mouth daily after supper.     . metroNIDAZOLE (METROGEL) 0.75 % gel Apply 1 application topically 2 (two) times daily.     . Multiple Vitamin (MULTIVITAMIN WITH MINERALS) TABS tablet Take 1 tablet by mouth daily after supper.    Marland Kitchen omeprazole (PRILOSEC) 20 MG capsule Take 20 mg by mouth daily after supper.     . Pitavastatin Calcium 4 MG TABS Take 2 mg by mouth daily after supper. Takes half of  tab     No current facility-administered medications for this visit.       Review of Systems:     Cardiac Review of Systems: Y or N  Chest Pain [  n  ]  Resting SOB [ n  ] Exertional SOB  [ n ]  Orthopnea [n  ]   Pedal Edema [  mild ]    Palpitations [ n ] Syncope  [ n ]   Presyncope [ n  ]  General Review of Systems: [Y] = yes [  ]=no Constitional: recent weight change [  ]; anorexia [  ];  fatigue [  ]; nausea [  ]; night sweats [  ]; fever [  ]; or chills [  ];                                                                                                                                          Dental: poor dentition[ y caps];  Last Dentist visit: last 6 months  Eye : blurred vision [  ]; diplopia [   ]; vision changes [  ];  Amaurosis fugax[  ]; Resp: cough [  ];  wheezing[  ];  hemoptysis[  ]; shortness of breath[  ]; paroxysmal nocturnal dyspnea[  ]; dyspnea on exertion[  ]; or orthopnea[  ];  GI:  gallstones[  ], vomiting[  ];  dysphagia[  ]; melena[  ];  hematochezia [  ]; heartburn[  ];   Hx of  Colonoscopy[y  ]; GU: kidney stones [  ]; hematuria[  ];   dysuria [ y ];  nocturia[  y];  history of     obstruction [ n ];             Skin: rash, swelling[  ];, hair loss[  ];  peripheral edema[  ];  or itching[  ]; Musculosketetal: myalgias[  ];  joint swelling[  ];  joint erythema[  ];  joint pain[  ];  back pain[  ];  Heme/Lymph: bruising[  ];  bleeding[  ];  anemia[  ];  Neuro: TIA[  ];  headaches[  ];  stroke[  ];  vertigo[  ];  seizures[  ];   paresthesias[  ];  difficulty walking[ yes, chronic back pain and bilaterial knee pain ];  Psych:depression[  ]; anxiety[  ];  Endocrine: diabetes[ n ];  thyroid dysfunction[ n ];  Immunizations: Flu [ y ]; Pneumococcal[y  ]; and shingles  Other:  Physical Exam: BP 133/83 mmHg  Pulse 97  Resp 16  Ht 6\' 7"  (2.007 m)  Wt 271 lb (122.925 kg)  BMI 30.52 kg/m2  SpO2 98%  General appearance: alert, cooperative, appears stated age and no distress Neurologic: intact Heart: regular rate and rhythm, S1, S2 normal, no murmur, click, rub or gallop Lungs: clear to auscultation bilaterally Abdomen: soft, non-tender; bowel sounds normal; no masses,  no organomegaly Extremities: extremities normal, atraumatic, no cyanosis or edema, Homans sign is negative, no sign of DVT and varicose veins noted no adenopathy cervical or axillary Patient has large body stature with a body surface area of 2.6 however he has no features of Marfan's,  Diagnostic Studies & Laboratory data:     Recent Radiology Findings:  Ct Chest Wo Contrast  05/26/2015  CLINICAL DATA:  Follow-up right lower  lobe pulmonary nodules. EXAM: CT CHEST WITHOUT CONTRAST TECHNIQUE: Multidetector CT imaging of the chest was performed following the standard protocol without IV contrast. COMPARISON:  12/02/2014 chest CT. FINDINGS:  Mediastinum/Nodes: Normal heart size. No significant pericardial fluid/thickening. Coronary atherosclerosis. Stable 4.8 cm ascending thoracic aortic aneurysm. Normal caliber pulmonary arteries. Normal visualized thyroid. Normal esophagus. No pathologically enlarged axillary, mediastinal or gross hilar lymph nodes, noting limited sensitivity for the detection of hilar adenopathy on this noncontrast study. Lungs/Pleura: No pneumothorax. No pleural effusion. Six tiny pulmonary nodules in the right upper lobe, largest 4 mm, all stable since 05/09/2009 and benign. There are two right middle lobe pulmonary nodules, largest 4 mm CT both stable since 05/09/2009 and benign. There are at least 8 scattered pulmonary nodules in the right lower lobe, largest 5 mm (series 4/image 44), all stable since 05/09/2009 and benign. Subcentimeter stable calcified posterior left upper lobe granuloma. There are at least 3 scattered left upper lobe tiny pulmonary nodules, largest 3 mm, all stable since 05/09/2009 and benign. There are at least 5 scattered tiny left lower lobe pulmonary nodules, largest 4 mm, all stable since 05/09/2009 and benign. No acute consolidative airspace disease, new significant pulmonary nodules or lung masses. Upper abdomen: Unremarkable. Musculoskeletal: No aggressive appearing focal osseous lesions. Moderate degenerative changes in the thoracic spine. Surgical hardware from [facility] is partially visualized in the lower cervical spine. IMPRESSION: 1. Numerous tiny pulmonary nodules throughout both lungs are all stable since 05/09/2009 and benign. No active pulmonary disease. 2. Stable 4.8 cm ascending thoracic aortic aneurysm. Ascending thoracic aortic aneurysm. Recommend semi-annual imaging followup by  CTA or MRA and referral to cardiothoracic surgery if not already obtained. This recommendation follows 2010 ACCF/AHA/AATS/ACR/ASA/SCA/SCAI/SIR/STS/SVM Guidelines for the Diagnosis and Management of Patients With Thoracic Aortic Disease. Circulation. 2010; 121: W960-A540 3. Coronary atherosclerosis. Electronically Signed   By: Delbert Phenix M.D.   On: 05/26/2015 08:40   Ct Angio Chest Aorta W/cm &/or Wo/cm  12/02/2014   CLINICAL DATA:  Follow-up lung nodule and thoracic aortic aneurysm, ex-smoker.  EXAM: CT ANGIOGRAPHY CHEST WITH CONTRAST  TECHNIQUE: Multidetector CT imaging of the chest was performed using the standard protocol during bolus administration of intravenous contrast. Multiplanar CT image reconstructions and MIPs were obtained to evaluate the vascular anatomy.  CONTRAST:  75 cc of Isovue 370  COMPARISON:  Chest CTs dated 08/06/2014, 12/03/2013, and 11/13/2012.  FINDINGS: The benign 4 mm pulmonary nodule within the left lateral lingula is stable. The 5 mm pulmonary nodule within the medial aspects of the superior segment of the right lower lobe is stable compared to the chest CT of 08/06/2014 and is also stable compared to the earlier study of 11/13/2012 indicating benignity.  There is a new 6 mm pulmonary nodule within the lateral aspects of the right lower lobe and there is a new 5 mm pulmonary nodule within the medial aspects of the right lower lobe (series 5, images 41 and 40 respectively). There is an additional questionable pulmonary nodule within the right lower lobe posteriorly measuring 7 mm (series 5, image 38). No other new lung findings seen.  The dilatation of the ascending thoracic aorta is unchanged at 4.6 cm diameter. Distal thoracic aortic arch is also again mildly prominent measuring 3.7 cm diameter, unchanged. No aortic dissection.  Heart size is upper normal, unchanged. Coronary artery calcifications noted within the left anterior descending and left circumflex coronary arteries. No  mass or enlarged lymph nodes seen within the mediastinum or perihilar regions. Supraclavicular regions are unremarkable. Limited images of the upper abdomen are unremarkable. Degenerative changes within the thoracic spine are unchanged. No acute osseous abnormality.  Review of the MIP images confirms the above  findings.  IMPRESSION: 1. New pulmonary nodules within the right lower lobe, as detailed above. These include a new 6 mm pulmonary nodule within the lateral aspects of the right lower lobe and a new 5 mm pulmonary nodule within the medial aspects of the right lower lobe. There is also a questionable pulmonary nodule within the right lower lobe posteriorly measuring 7 mm greatest dimension. As the patient is at high risk for bronchogenic carcinoma, follow-up chest CT at 3-13months is recommended. This recommendation follows the consensus statement: Guidelines for Management of Small Pulmonary Nodules Detected on CT Scans: A Statement from the Fleischner Society as published in Radiology 2005; 237:395-400. 2. Remainder of the pulmonary nodules have been stable for at least 2 years indicating benignity. 3. Stable ascending thoracic aortic aneurysm measuring 4.6 cm diameter. 4. Coronary artery calcifications.   Electronically Signed   By: Bary Richard M.D.   On: 12/02/2014 14:26  I have independently reviewed the above radiology studies  and reviewed the findings with the patient.   12/03/2013   CLINICAL DATA:  Thoracic aortic aneurysm.  EXAM: CT ANGIOGRAPHY CHEST WITH CONTRAST  TECHNIQUE: Multidetector CT imaging of the chest was performed using the standard protocol during bolus administration of intravenous contrast. Multiplanar CT image reconstructions and MIPs were obtained to evaluate the vascular anatomy.  CONTRAST:  80mL OMNIPAQUE IOHEXOL 350 MG/ML SOLN  COMPARISON:  CT scan of November 13, 2012 and December 26, 2011.  FINDINGS: No pneumothorax or pleural effusion is noted. Stable 4.5 mm nodule is noted  in left lingula laterally which is unchanged compared with 2013 and can be considered benign at this point. 4.8 cm nodule is noted in the medial portion of the superior segment of the right lower lobe best seen on image number 26 of series 5. This is unchanged compared to prior exam. No acute pulmonary disease is noted. Coronary artery calcifications are again noted. No significant mediastinal mass or adenopathy is noted. Ascending thoracic aorta has maximum measured diameter 4.6 cm which is not changed compared to prior exam. No dissection or significant atheromatous disease is noted. Normal caliber of descending thoracic aorta is noted. Visualized portion of upper abdomen appears normal. The great vessels appear to be widely patent without significant stenosis. No significant osseous abnormality is noted.  Review of the MIP images confirms the above findings.  IMPRESSION: 4.6 cm ascending thoracic aortic aneurysm is noted which is unchanged compared to prior exam.  Coronary artery calcifications are noted consistent with coronary artery disease.  Stable stable 4.8 cm nodule seen in superior segment of right lower lobe ; followup chest CT in 12 months is recommended to ensure stability.   Electronically Signed   By: Roque Lias M.D.   On: 12/03/2013 14:47   I have discussed with radiology and reviewed the films, should be 4.8 mm not cm nodule  Ct Angio Chest Aorta W/cm &/or Wo/cm  11/13/2012   *RADIOLOGY REPORT*  Clinical Data: Aortic aneurysm  CT ANGIOGRAPHY CHEST  Technique:  Multidetector CT imaging of the chest using the standard protocol during bolus administration of intravenous contrast. Multiplanar reconstructed images including MIPs were obtained and reviewed to evaluate the vascular anatomy.  Contrast: OMNIPAQUE IOHEXOL 350 MG/ML SOLN  Comparison: 12/26/2011  Findings: Aortic diameter at the sinus of Valsalva, sinotubular junction, and ascending aorta are 5.0 cm, 3.7 cm, and 4.6 cm  respectively.  No evidence of aortic dissection or transection. Little if any plaque within the thoracic aorta.  Innominate  artery, right vertebral artery, right subclavian artery, right common carotid artery, left common carotid artery, left subclavian artery, and left vertebral artery are all widely patent within the confines of the examination.  Right coronary artery stent is noted.  Calcification in the circumflex and left anterior descending coronary vessels are noted. Distal right coronary artery calcifications are noted.  No obvious filling defects in the pulmonary arterial tree to suggest acute pulmonary thromboembolism.  Negative abnormal mediastinal adenopathy.  No pericardial effusion.  Sub centimeter right thyroid hypodensity on image 17 of series 4.  No pneumothorax.  No pleural effusion.  Calcified lymph nodes in the mediastinum or are noted.  Sub centimeter pulmonary nodules are stable.  Cervical spine fusion hardware is in place.  The right-sided screw within the C7 vertebral body has retracted and is within the prevertebral soft tissues.  Images of the upper abdomen demonstrate diffuse hepatic steatosis. Prominence of the adrenal glands without focal mass is stable.  IMPRESSION: Maximal diameter of the ascending aorta is 4.6 cm.  Previously, it was measured at 4.9 cm.  It is therefore not enlarging.  No evidence of aortic dissection.  Sub centimeter right thyroid hypodensity is stable in retrospect compared with a study from 2012.  This supports benign etiology.  Cervical spine fusion hardware is in place.  One of the screws in C7 has retracted and is residing within the prevertebral soft tissues.   Original Report Authenticated By: Jolaine Click, M.D.    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  Follow-up study.  CT ANGIOGRAPHY OF THE HEART, CORONARY ARTERY,  STRUCTURE, AND MORPHOLOGY  CONTRAST:  100 ml of Omnipaque-300.  COMPARISON:  CTA chest 05/14/2011.  TECHNIQUE:  CT angiography of the coronary vessels was performed on a 256 channel system using prospective ECG gating.  A scout and noncontrast exam (for calcium scoring) were performed.  Circulation time was measured using a test bolus.  Coronary CTA was performed with sub mm slice collimation during portions of the cardiac cycle after prior injection of iodinated contrast.  Imaging post processing was performed on an independent workstation creating multiplanar and 3-D images, and quantitative analysis of the heart and coronary arteries.  Note that this exam targets the heart and the chest was not imaged in its entirety.  PREMEDICATION: Lopressor 5 mg, IV Nitroglycerin 400 mcg, sublingual.  FINDINGS: Technical quality:  Good  Heart rate:  55-60  CORONARY ARTERIES: Left main coronary artery:  Not present (there are separate ostia for the left anterior descending and left circumflex coronary arteries directly off the left sinus of Valsalva). Left anterior descending:  Mildly diseased with mixed calcified noncalcified atherosclerotic plaque with areas of stenosis up to 25 - 50% in the mid LAD. Left circumflex:  Large caliber vessel that quickly gives rise to two large obtuse marginal vessels.  Proximal left circumflex demonstrates only minimal calcified plaque but no associated stenosis. Obtuse Marginal 1: Mildly diseased with calcified plaque with no associated luminal stenosis. Obtuse Marginal 2: Extensive calcified atherosclerotic plaque proximally with stenosis >50% (possibly >75%), however, secondary to the high degree of calcification, accurate assessment for luminal stenosis is not possible on this examination. Right coronary artery:  There is a stent in the proximal right coronary artery which is grossly patent.  There is some calcified plaque at the distal end of the stent where the vessel is  slightly acutely  angulated.  There appears to be some stenosis in this region, however, it is favored to be approximately 25 - 50% (accurate assessment is limited by the high-density the stent material and the heavily calcified plaque).  The remainder of the RCA is otherwise mildly diseased with nonobstructive plaque. Posterior descending artery:  Patent with a mixed calcified noncalcified plaque.  Secondary to the small size of the vessel and excessive image noise, accurate assessment for stenosis is not possible on this examination, however, there appears to be at least 25 - 50% stenosis of the mid PDA. Dominance:  Right  CORONARY CALCIUM: Total Agatston Score:  655 MESA database percentile:  80th Comment - it appears as though the patient has moved or breathed during image acquisition, such that the a portion of the coronary arteries was incompletely visualized.  This likely underestimates the patient's true calcium score.  AORTA AND PULMONARY MEASUREMENTS: Aortic root (21 - 40 mm):             29 mm  at the annulus             49 mm  at the sinuses of Valsalva         36 mm  at the sinotubular junction Ascending aorta ( <  40 mm):  50 mm Aortic arch : 34 mm Descending aorta:  28 mm Main pulmonary artery:  ( <  30 mm):  35 mm  EXTRACARDIAC FINDINGS: Several tiny pulmonary nodules measuring 4 mm are scattered throughout the lungs bilaterally and are completely unchanged compared to prior examinations (dating back to 2007); these can be considered radiographically benign requiring no further imaging follow-up.  Small calcified granulomas are also noted in the posterior aspect of the left upper lobe.  Multiple calcified left hilar and mediastinal lymph nodes are noted.  No acute consolidative airspace disease.  No pleural effusions.  Visualized portions of the upper abdomen are unremarkable.  IMPRESSION:  1. Ascending thoracic aortic aneurysm is similar in size measuring 4.9 cm on today's examination.  No evidence of thoracic aortic  dissection at this time. 2.  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  The patient's aortic valve appears to be tricuspid.  Additionally, there is no effacement of the sinotubular junction. 3.  Multivessel coronary artery disease, as detailed above.  Most importantly, there is a heavily calcified plaque in the obtuse marginal 2 branch (a large vessel) which appears to be stenotic (i.e., at least 50% diameter stenosis, and possbily in excess of 75%), however, accurate assessment for percentage stenosis is limited on this examination secondary to the high degree of calcification. Clinical correlation is recommended, with consideration for further evaluation with stress testing if there is clinical concern for inducible ischemia. 4.  Coronary artery calcium scoring is at least 655 (the patient moved during the image acquisition, resulting in incomplete coverage of the coronary arteries which likely underestimates the true calcium score), which is 80th percentile for patient's of matched age, gender and race/ethnicity. 5.  Right coronary artery dominance.   Original Report Authenticated By: Florencia Reasons, M.D.     Recent Lab Findings: Lab Results  Component Value Date   WBC 12.4* 08/06/2014   HGB 15.3 08/06/2014   HCT 45.0 08/06/2014   PLT 234 08/06/2014   GLUCOSE 141* 08/06/2014   CHOL 184 11/14/2010   TRIG 143.0 11/14/2010   HDL 48.60 11/14/2010   LDLCALC 107* 11/14/2010  ALT 16 08/06/2014   AST 22 08/06/2014   NA 140 08/06/2014   K 4.4 08/06/2014   CL 104 08/06/2014   CREATININE 0.95 11/29/2014   BUN 17 11/29/2014   CO2 25 08/06/2014   INR 1.0 06/06/2011    Procedure Performed:  1. Left Heart Catheterization 2. Selective Coronary Angiography 3. Left ventricular angiogram 4. Aortic root angiogram Operator: Verne Carrow, MD  Indication: Pt with known CAD and thoracic aortic aneurysm. The aortic aneurysm has  enlarged over the last year and we recently referred Mr. Kann to see Dr. Tyrone Sage. Plans are being made for aortic root replacement. Cardiac cath today to exclude progression of CAD.  Procedure Details:  The risks, benefits, complications, treatment options, and expected outcomes were discussed with the patient. The patient and/or family concurred with the proposed plan, giving informed consent. The patient was brought to the cath lab after IV hydration was begun and oral premedication was given. The patient was further sedated with Versed and Fentanyl. The right groin was prepped and draped in the usual manner. Using the modified Seldinger access technique, a 4 French sheath was placed in the right femoral artery. A JL-5 catheter was used to engage the LAD with sub-selective shots of the Circumflex. An AL-1 catheter was used to non-selectively inject the Circumflex artery. A 3DRC catheter was used to engage and inject the RCA. A pigtail catheter was used to perform a left ventricular angiogram. The catheter was pulled back across the aortic valve. An aortic root angiogram was performed.  There were no immediate complications. The patient was taken to the recovery area in stable condition.  Hemodynamic Findings:  Central aortic pressure: 130/72  Left ventricular pressure: 130/17/24  Angiographic Findings:  Left main: There are separate ostia for the LAD and Circumflex.  Left Anterior Descending Artery: Large vessel that courses to the apex. No obstructive disease noted.Small diagonal branch.  Circumflex Artery: Large caliber vessel with no obstructive disease noted.  Right Coronary Artery: Very large, dominant vessel with patent proximal stent. There is mild plaque disease in the mid and distal RCA. The small caliber PDA has 60% stenosis.  Left Ventricular Angiogram: Dilated LV. LVEF=50%.  Aortic root: Dilated aortic root and ascending aorta.  Impression:  1. 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 Preserved LV systolic function  3. 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     ECHO: 01/2015             [facility]            [address]            [place], [place] [address]              [phone]  ------------------------------------------------------------------- Echocardiography  Patient:  Miki, Blank MR #:    [id number] Study Date: 01/25/2015 Gender:   M Age:    74 Height:   200.7 cm Weight:   127.3 kg BSA:    2.69 m^2 Pt. Status: Room:  SONOGRAPHER Dewitt Hoes, RDCS ATTENDING  Teresita Madura, Christopher REFERRING  McAlhany, Christopher PERFORMING  [facility]  cc:  ------------------------------------------------------------------- LV EF: 55% -  60%  ------------------------------------------------------------------- Indications:   Dyspnea (R06.00).  ------------------------------------------------------------------- History:  PMH:  Coronary artery disease. Risk factors: Thoracic aortic aneurysm. Former tobacco use.  ------------------------------------------------------------------- Study Conclusions  - Left ventricle: The cavity size was normal. Wall thickness was increased in a pattern of mild LVH.  Systolic function was normal. The estimated ejection fraction was in the range of 55% to 60%. Wall motion was normal; there were no regional wall motion abnormalities. Doppler parameters are consistent with abnormal left ventricular relaxation (grade 1 diastolic dysfunction). The E/e&' ratio is between 8-15, suggesting indeterminate LV filling pressure. - Aorta: Aortic root dimension: 53 mm (ED). Ascending aortic diameter: 47 mm (S). - Ascending aorta: The ascending aorta and root are dilated. - Left atrium: The atrium was normal in size. - Right atrium: Moderately dilated at 25  cm2. - Tricuspid valve: There was mild regurgitation.  Impressions:  - Compared to the prior study in 2012, the EF remains unchanged. The ascending aorta now measures up to 5.3 cm. There is moderate RAE, mild TR and RVSP is 27 mmHg + RAP (IVC not visualized).  ------------------------------------------------------------------- Labs, prior tests, procedures, and surgery: Transthoracic echocardiography (11/27/2010).   EF was 60%.  Echocardiography. M-mode, complete 2D, spectral Doppler, and color Doppler. Birthdate: Patient birthdate: 09-27-1940. Age: Patient is 75 yr old. Sex: Gender: male.  BMI: 31.6 kg/m^2. Blood pressure:   120/87 Patient status: Outpatient. Study date: Study date: 01/25/2015. Study time: 07:35 AM. Location: [facility]  -------------------------------------------------------------------  ------------------------------------------------------------------- Left ventricle: The cavity size was normal. Wall thickness was increased in a pattern of mild LVH. Systolic function was normal. The estimated ejection fraction was in the range of 55% to 60%. Wall motion was normal; there were no regional wall motion abnormalities. Doppler parameters are consistent with abnormal left ventricular relaxation (grade 1 diastolic dysfunction). The E/e&' ratio is between 8-15, suggesting indeterminate LV filling pressure.  ------------------------------------------------------------------- Aortic valve:  Structurally normal valve. Trileaflet. Cusp separation was normal. Doppler: Transvalvular velocity was within the normal range. There was no stenosis. There was no regurgitation.  ------------------------------------------------------------------- Aorta: Ascending aorta: The ascending aorta and root are dilated.  ------------------------------------------------------------------- Mitral valve:  Structurally normal valve.  Leaflet separation  was normal. Doppler: Transvalvular velocity was within the normal range. There was no evidence for stenosis. There was no regurgitation.  Peak gradient (D): 3 mm Hg.  ------------------------------------------------------------------- Left atrium: The atrium was normal in size.  ------------------------------------------------------------------- Atrial septum: Poorly visualized.  ------------------------------------------------------------------- Right ventricle: The cavity size was normal. Wall thickness was normal. The moderator band was prominent. Systolic function was normal.  ------------------------------------------------------------------- Pulmonic valve:  The valve appears to be grossly normal. Doppler: There was trivial regurgitation.  ------------------------------------------------------------------- Tricuspid valve:  Doppler: There was mild regurgitation.  ------------------------------------------------------------------- Pulmonary artery:  The main pulmonary artery was normal-sized.  ------------------------------------------------------------------- Right atrium: Moderately dilated at 25 cm2.  ------------------------------------------------------------------- Pericardium: There was no pericardial effusion.  ------------------------------------------------------------------- Systemic veins: Not visualized.  ------------------------------------------------------------------- Measurements  Left ventricle             Value    Reference LV ID, ED, PLAX chordal    (H)   59  mm   43 - 52 LV ID, ES, PLAX chordal    (H)   41  mm   23 - 38 LV fx shortening, PLAX chordal     31  %   >=29 LV PW thickness, ED          12  mm   --------- IVS/LV PW ratio, ED          0.92     <=1.3 Stroke volume, 2D           130  ml   --------- Stroke volume/bsa, 2D  48  ml/m^2 --------- LV e&', lateral             7.95 cm/s  --------- LV E/e&', lateral            10.38    --------- LV e&', medial             4.58 cm/s  --------- LV E/e&', medial            18.01    --------- LV e&', average             6.27 cm/s  --------- LV E/e&', average            13.17    ---------  Ventricular septum           Value    Reference IVS thickness, ED           11  mm   ---------  LVOT                  Value    Reference LVOT ID, S               26  mm   --------- LVOT area               5.31 cm^2  --------- LVOT mean velocity, S         66.6 cm/s  --------- LVOT VTI, S              24.4 cm   ---------  Aorta                 Value    Reference Aortic root ID, ED           53  mm   --------- Ascending aorta ID, A-P, S       47  mm   ---------  Left atrium              Value    Reference LA ID, A-P, ES             37  mm   --------- LA ID/bsa, A-P             1.38 cm/m^2 <=2.2 LA volume, S              56.4 ml   --------- LA volume/bsa, S            21  ml/m^2 --------- LA volume, ES, 1-p A4C         44.5 ml   --------- LA volume/bsa, ES, 1-p A4C       16.5 ml/m^2 --------- LA volume, ES, 1-p A2C         72  ml   --------- LA volume/bsa, ES, 1-p A2C       26.8 ml/m^2 ---------  Mitral valve              Value    Reference Mitral E-wave peak velocity      82.5 cm/s  --------- Mitral deceleration time        201  ms   150 - 230 Mitral peak gradient, D        3   mm Hg --------- Mitral E/A ratio, peak         1.1      ---------  Pulmonary arteries           Value    Reference PA pressure, S, DP  27  mm Hg <=30  Tricuspid valve            Value    Reference Tricuspid regurg peak velocity     261  cm/s  --------- Tricuspid peak RV-RA gradient     27  mm Hg ---------  Systemic veins             Value    Reference Estimated CVP             8   mm Hg ---------  Right ventricle            Value    Reference RV pressure, S, DP       (H)   35  mm Hg <=30 RV s&', lateral, S           12.2 cm/s  ---------  Legend: (L) and (H) mark values outside specified reference range.  ------------------------------------------------------------------- Prepared and Electronically Authenticated by  Zoila Shutter MD 2016-10-18T15:10:47  Aortic Size Index=     4.8   /Body surface area is 2.62 meters squared. = 1.8  < 2.75 cm/m2      4% risk per year 2.75 to 4.25          8% risk per year > 4.25 cm/m2    20% risk per year  cross sectional area of aorta cm2/height in meters > 10 consider  surgery 16.6/2= 8.3    Assessment / Plan:   Pulmonary nodules  On further review and repeat scan today stable since 2011   Known dilated ascending aorta 4.6-4.8 cm since at least 2004,   The symptoms,  risks and lethal nature of acute aortic dissection was explained to him and his wife.  No beta blocker per cardiology   I plan to see him back in 12 months with repeat CTA of chest ules    Delight Ovens MD  Beeper [phone] Office [phone] 05/26/2015 9:34 AM

## 2015-07-13 ENCOUNTER — Other Ambulatory Visit: Payer: Self-pay | Admitting: Internal Medicine

## 2015-07-13 DIAGNOSIS — N289 Disorder of kidney and ureter, unspecified: Secondary | ICD-10-CM

## 2015-08-01 ENCOUNTER — Telehealth: Payer: Self-pay | Admitting: Cardiovascular Disease

## 2015-08-01 NOTE — Telephone Encounter (Signed)
NeWMessage  Pt wife calling to speak w/ RN cocnernign pt c/o of recent discomfort in chest- pt wife stated that pulse is normal and issue may be muscular- Please call back and discuss.

## 2015-08-01 NOTE — Telephone Encounter (Signed)
Left message to call back  

## 2015-08-02 ENCOUNTER — Encounter: Payer: Self-pay | Admitting: Physician Assistant

## 2015-08-02 ENCOUNTER — Ambulatory Visit (INDEPENDENT_AMBULATORY_CARE_PROVIDER_SITE_OTHER): Payer: Medicare Other | Admitting: Physician Assistant

## 2015-08-02 ENCOUNTER — Ambulatory Visit
Admission: RE | Admit: 2015-08-02 | Discharge: 2015-08-02 | Disposition: A | Discharge status: Home / Self Care | Payer: Medicare Other | Source: Ambulatory Visit | Attending: Physician Assistant | Admitting: Physician Assistant

## 2015-08-02 VITALS — BP 120/80 | HR 78 | Ht 79.0 in | Wt 275.0 lb

## 2015-08-02 DIAGNOSIS — R0789 Other chest pain: Secondary | ICD-10-CM

## 2015-08-02 DIAGNOSIS — I2510 Atherosclerotic heart disease of native coronary artery without angina pectoris: Secondary | ICD-10-CM

## 2015-08-02 DIAGNOSIS — I10 Essential (primary) hypertension: Secondary | ICD-10-CM | POA: Diagnosis not present

## 2015-08-02 DIAGNOSIS — I712 Thoracic aortic aneurysm, without rupture, unspecified: Secondary | ICD-10-CM

## 2015-08-02 DIAGNOSIS — E785 Hyperlipidemia, unspecified: Secondary | ICD-10-CM

## 2015-08-02 LAB — TROPONIN I

## 2015-08-02 MED ORDER — NITROGLYCERIN 0.4 MG SL SUBL: 0.4000 mg | SUBLINGUAL_TABLET | SUBLINGUAL | Status: DC | PRN

## 2015-08-02 NOTE — Patient Instructions (Addendum)
Medication Instructions:  Your physician has recommended you make the following change in your medication: An Rx for sublingual Nitro has been sent to your pharmacy. Use as directed Labwork: Bmet, Cbc, Troponin today Testing/Procedures: Your physician has requested that you have en exercise stress myoview. For further information please visit [email or web address] Please follow instruction sheet, as given. A chest x-ray takes a picture of the organs and structures inside the chest, including the heart, lungs, and blood vessels. This test can show several things, including, whether the heart is enlarges; whether fluid is building up in the lungs; and whether pacemaker / defibrillator leads are still in place. (To be done at [facility] [address]) Follow-Up: Follow up as planned with Dr.Mcalhany in May 2017 Any Other Special Instructions Will Be Listed Below (If Applicable).   Nitroglycerin sublingual tablets What is this medicine? NITROGLYCERIN (nye troe GLI ser in) is a type of vasodilator. It relaxes blood vessels, increasing the blood and oxygen supply to your heart. This medicine is used to relieve chest pain caused by angina. It is also used to prevent chest pain before activities like climbing stairs, going outdoors in cold weather, or sexual activity. This medicine may be used for other purposes; ask your health care provider or pharmacist if you have questions. What should I tell my health care provider before I take this medicine? They need to know if you have any of these conditions: -anemia -head injury, recent stroke, or bleeding in the brain -liver disease -previous heart attack -an unusual or allergic reaction to nitroglycerin, other medicines, foods, dyes, or preservatives -pregnant or trying to get pregnant -breast-feeding How should I use this medicine? Take this medicine by mouth as needed. At the first sign of an angina attack  (chest pain or tightness) place one tablet under your tongue. You can also take this medicine 5 to 10 minutes before an event likely to produce chest pain. Follow the directions on the prescription label. Let the tablet dissolve under the tongue. Do not swallow whole. Replace the dose if you accidentally swallow it. It will help if your mouth is not dry. Saliva around the tablet will help it to dissolve more quickly. Do not eat or drink, smoke or chew tobacco while a tablet is dissolving. If you are not better within 5 minutes after taking ONE dose of nitroglycerin, call 9-1-1 immediately to seek emergency medical care. Do not take more than 3 nitroglycerin tablets over 15 minutes. If you take this medicine often to relieve symptoms of angina, your doctor or health care professional may provide you with different instructions to manage your symptoms. If symptoms do not go away after following these instructions, it is important to call 9-1-1 immediately. Do not take more than 3 nitroglycerin tablets over 15 minutes. Talk to your pediatrician regarding the use of this medicine in children. Special care may be needed. Overdosage: If you think you have taken too much of this medicine contact a poison control center or emergency room at once. NOTE: This medicine is only for you. Do not share this medicine with others. What if I miss a dose? This does not apply. This medicine is only used as needed. What may interact with this medicine? Do not take this medicine with any of the following medications: -certain migraine medicines like ergotamine and dihydroergotamine (DHE) -medicines used to treat erectile dysfunction like sildenafil, tadalafil, and vardenafil -riociguat This medicine may also interact with the following medications: -alteplase -aspirin -  heparin -medicines for high blood pressure -medicines for mental depression -other medicines used to treat angina -phenothiazines like chlorpromazine,  mesoridazine, prochlorperazine, thioridazine This list may not describe all possible interactions. Give your health care provider a list of all the medicines, herbs, non-prescription drugs, or dietary supplements you use. Also tell them if you smoke, drink alcohol, or use illegal drugs. Some items may interact with your medicine. What should I watch for while using this medicine? Tell your doctor or health care professional if you feel your medicine is no longer working. Keep this medicine with you at all times. Sit or lie down when you take your medicine to prevent falling if you feel dizzy or faint after using it. Try to remain calm. This will help you to feel better faster. If you feel dizzy, take several deep breaths and lie down with your feet propped up, or bend forward with your head resting between your knees. You may get drowsy or dizzy. Do not drive, use machinery, or do anything that needs mental alertness until you know how this drug affects you. Do not stand or sit up quickly, especially if you are an older patient. This reduces the risk of dizzy or fainting spells. Alcohol can make you more drowsy and dizzy. Avoid alcoholic drinks. Do not treat yourself for coughs, colds, or pain while you are taking this medicine without asking your doctor or health care professional for advice. Some ingredients may increase your blood pressure. What side effects may I notice from receiving this medicine? Side effects that you should report to your doctor or health care professional as soon as possible: -blurred vision -dry mouth -skin rash -sweating -the feeling of extreme pressure in the head -unusually weak or tired Side effects that usually do not require medical attention (report to your doctor or health care professional if they continue or are bothersome): -flushing of the face or neck -headache -irregular heartbeat, palpitations -nausea, vomiting This list may not describe all possible side  effects. Call your doctor for medical advice about side effects. You may report side effects to FDA at 1-800-FDA-1088. Where should I keep my medicine? Keep out of the reach of children. Store at room temperature between 20 and 25 degrees C (68 and 77 degrees F). Store in Retail buyeroriginal container. Protect from light and moisture. Keep tightly closed. Throw away any unused medicine after the expiration date. NOTE: This sheet is a summary. It may not cover all possible information. If you have questions about this medicine, talk to your doctor, pharmacist, or health care provider.    2016, Elsevier/[employer] Standard. (2013-01-22 17:57:36)      If you need a refill on your cardiac medications before your next appointment, please call your pharmacy.

## 2015-08-02 NOTE — Telephone Encounter (Signed)
Follow Up ° °Pt returned call//  °

## 2015-08-02 NOTE — Telephone Encounter (Signed)
Thanks

## 2015-08-02 NOTE — Telephone Encounter (Signed)
Spoke with pt. 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 On Sunday he start having chest tightness. This is constant. Has not worsened or decreased since it started on Sunday.  He has not taken anything for the tightness. No shortness of breath. No nausea. Not related to exertion.  Does not change with movement. He was able to sleep last night.  This AM he continues with chest tightness and pain in upper back below shoulder blades.  His wife checked BP and heart rate yesterday and it is OK.  He does have history of back pain and neck surgery.  Will review with provider in office.
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
## 2015-08-02 NOTE — Telephone Encounter (Signed)
Reviewed with Tereso NewcomerScott Weaver, PA and he can see pt this afternoon.  I spoke with pt and made appt for him to see Lorin PicketScott today at 2 PM

## 2015-08-03 ENCOUNTER — Telehealth: Payer: Self-pay | Admitting: *Deleted

## 2015-08-03 LAB — BASIC METABOLIC PANEL
BUN: 15 mg/dL (ref 7–25)
CO2: 20 mmol/L (ref 20–31)
Calcium: 9.1 mg/dL (ref 8.6–10.3)
Chloride: 105 mmol/L (ref 98–110)
Creat: 0.92 mg/dL (ref 0.70–1.18)
GLUCOSE: 87 mg/dL (ref 65–99)
Potassium: 4.1 mmol/L (ref 3.5–5.3)
Sodium: 142 mmol/L (ref 135–146)

## 2015-08-03 LAB — CBC WITH DIFFERENTIAL/PLATELET
BASOS PCT: 0 %
Basophils Absolute: 0 cells/uL (ref 0–200)
Eosinophils Absolute: 77 cells/uL (ref 15–500)
Eosinophils Relative: 1 %
HEMATOCRIT: 44.3 % (ref 38.5–50.0)
HEMOGLOBIN: 15.2 g/dL (ref 13.2–17.1)
LYMPHS ABS: 2079 {cells}/uL (ref 850–3900)
Lymphocytes Relative: 27 %
MCH: 31.3 pg (ref 27.0–33.0)
MCHC: 34.3 g/dL (ref 32.0–36.0)
MCV: 91.2 fL (ref 80.0–100.0)
MONO ABS: 693 {cells}/uL (ref 200–950)
MPV: 9.1 fL (ref 7.5–12.5)
Monocytes Relative: 9 %
NEUTROS PCT: 63 %
Neutro Abs: 4851 cells/uL (ref 1500–7800)
Platelets: 214 10*3/uL (ref 140–400)
RBC: 4.86 MIL/uL (ref 4.20–5.80)
RDW: 13.6 % (ref 11.0–15.0)
WBC: 7.7 10*3/uL (ref 3.8–10.8)

## 2015-08-03 NOTE — Telephone Encounter (Signed)
Pt had given permission to s/w wife who has been notified of lab results for pt by phone with verbal understanding.

## 2015-08-10 ENCOUNTER — Telehealth (HOSPITAL_COMMUNITY): Payer: Self-pay | Admitting: *Deleted

## 2015-08-10 NOTE — Telephone Encounter (Signed)
Left message on voicemail in reference to upcoming appointment scheduled for 08/16/15. Phone number given for a call back so details instructions can be given. Aaleah Hirsch J Kyley Laurel, RN  

## 2015-08-15 ENCOUNTER — Telehealth (HOSPITAL_COMMUNITY): Payer: Self-pay | Admitting: *Deleted

## 2015-08-15 NOTE — Telephone Encounter (Signed)
Patient given detailed instructions per Myocardial Perfusion Study Information Sheet for the test on 08/16/15 at 0715. Patient notified to arrive 15 minutes early and that it is imperative to arrive on time for appointment to keep from having the test rescheduled.  If you need to cancel or reschedule your appointment, please call the office within 24 hours of your appointment. Failure to do so may result in a cancellation of your appointment, and a $50 no show fee. Patient verbalized understanding.Ronald [name] W    

## 2015-08-16 ENCOUNTER — Ambulatory Visit (HOSPITAL_COMMUNITY): Payer: Medicare Other | Attending: Cardiology

## 2015-08-16 ENCOUNTER — Ambulatory Visit (HOSPITAL_COMMUNITY): Payer: Medicare Other

## 2015-08-16 ENCOUNTER — Encounter (HOSPITAL_COMMUNITY): Payer: Self-pay

## 2015-08-16 DIAGNOSIS — R0609 Other forms of dyspnea: Secondary | ICD-10-CM | POA: Insufficient documentation

## 2015-08-16 DIAGNOSIS — R0789 Other chest pain: Secondary | ICD-10-CM | POA: Insufficient documentation

## 2015-08-16 LAB — MYOCARDIAL PERFUSION IMAGING
CHL CUP NUCLEAR SSS: 8
CSEPPHR: 82 {beats}/min
LHR: 0.17
LVDIAVOL: 152 mL (ref 62–150)
LVSYSVOL: 79 mL
Rest HR: 63 {beats}/min
SDS: 2
SRS: 6
TID: 0.98

## 2015-08-16 MED ORDER — TECHNETIUM TC 99M SESTAMIBI GENERIC - CARDIOLITE
10.7000 | Freq: Once | INTRAVENOUS | Status: AC | PRN
  Administered 2015-08-16: 11 via INTRAVENOUS

## 2015-08-16 MED ORDER — TECHNETIUM TC 99M SESTAMIBI GENERIC - CARDIOLITE
32.6000 | Freq: Once | INTRAVENOUS | Status: AC | PRN
  Administered 2015-08-16: 33 via INTRAVENOUS

## 2015-08-16 MED ORDER — REGADENOSON 0.4 MG/5ML IV SOLN
0.4000 mg | Freq: Once | INTRAVENOUS | Status: AC
  Administered 2015-08-16: 0.4 mg via INTRAVENOUS

## 2015-08-17 ENCOUNTER — Ambulatory Visit (HOSPITAL_COMMUNITY): Payer: Medicare Other

## 2015-08-23 ENCOUNTER — Other Ambulatory Visit: Payer: Medicare Other

## 2015-08-26 ENCOUNTER — Other Ambulatory Visit: Payer: Medicare Other

## 2015-08-26 ENCOUNTER — Ambulatory Visit
Admission: RE | Admit: 2015-08-26 | Discharge: 2015-08-26 | Disposition: A | Discharge status: Home / Self Care | Payer: Medicare Other | Source: Ambulatory Visit | Attending: Internal Medicine | Admitting: Internal Medicine

## 2015-08-26 DIAGNOSIS — N289 Disorder of kidney and ureter, unspecified: Secondary | ICD-10-CM

## 2015-08-26 MED ORDER — GADOBENATE DIMEGLUMINE 529 MG/ML IV SOLN
20.0000 mL | Freq: Once | INTRAVENOUS | Status: AC | PRN
  Administered 2015-08-26: 20 mL via INTRAVENOUS

## 2015-09-01 ENCOUNTER — Ambulatory Visit (HOSPITAL_COMMUNITY): Payer: Medicare Other | Attending: Cardiovascular Disease

## 2015-09-01 ENCOUNTER — Encounter: Payer: Self-pay | Admitting: Physician Assistant

## 2015-09-01 ENCOUNTER — Other Ambulatory Visit: Payer: Self-pay

## 2015-09-01 DIAGNOSIS — I2510 Atherosclerotic heart disease of native coronary artery without angina pectoris: Secondary | ICD-10-CM | POA: Diagnosis not present

## 2015-09-01 DIAGNOSIS — I517 Cardiomegaly: Secondary | ICD-10-CM | POA: Insufficient documentation

## 2015-09-01 DIAGNOSIS — Z720 Tobacco use: Secondary | ICD-10-CM | POA: Diagnosis not present

## 2015-09-01 DIAGNOSIS — R0789 Other chest pain: Secondary | ICD-10-CM | POA: Insufficient documentation

## 2015-09-01 DIAGNOSIS — I712 Thoracic aortic aneurysm, without rupture: Secondary | ICD-10-CM | POA: Insufficient documentation

## 2015-09-01 DIAGNOSIS — I351 Nonrheumatic aortic (valve) insufficiency: Secondary | ICD-10-CM | POA: Insufficient documentation

## 2015-09-02 ENCOUNTER — Encounter: Payer: Self-pay | Admitting: Cardiovascular Disease

## 2015-09-02 ENCOUNTER — Telehealth: Payer: Self-pay | Admitting: *Deleted

## 2015-09-02 ENCOUNTER — Ambulatory Visit (INDEPENDENT_AMBULATORY_CARE_PROVIDER_SITE_OTHER): Payer: Medicare Other | Admitting: Cardiovascular Disease

## 2015-09-02 VITALS — BP 120/72 | HR 64 | Ht 79.0 in | Wt 272.0 lb

## 2015-09-02 DIAGNOSIS — I10 Essential (primary) hypertension: Secondary | ICD-10-CM

## 2015-09-02 DIAGNOSIS — I712 Thoracic aortic aneurysm, without rupture, unspecified: Secondary | ICD-10-CM

## 2015-09-02 DIAGNOSIS — E785 Hyperlipidemia, unspecified: Secondary | ICD-10-CM

## 2015-09-02 DIAGNOSIS — I2510 Atherosclerotic heart disease of native coronary artery without angina pectoris: Secondary | ICD-10-CM

## 2015-09-02 NOTE — Progress Notes (Signed)
Chief Complaint  Patient presents with  . Follow-up      History of Present Illness:75 yo WM with h/o CAD s/p stent RCA 2004, HTN, HLD, dilated aortic root (Ascending aortic aneurysm) here today for cardiac follow up. He has been followed in the past by Dr. Deborah Chalk. Echo August 2012 with LVEF 55-60%. He has a thoracic arotic aneurysm and is followed yearly by Dr. Tyrone Sage with yearly CTA chest. Last Chest CTA February 2016 with stable dilated ascending aorta. He has not tolerated statins due to muscle aches. No beta blocker secondary to bradycardia. He was seen in our office 08/02/15 by Tereso Newcomer, PA-C and c/o chest pain after working digging holes. Nuclear stress test 08/16/15 with no ischemia. Troponin was negative. Chest x-ray was normal. Echo 09/01/15 with normal LV systolic function, mild to moderate AI.   He tells me today that he has had no exertional chest pain, dizziness, near syncope or syncope. He has been active. He has SOB with heavy exertion.   Primary Care Physician: [name]   Last Lipid Profile: Followed in primary care  Past Medical History  Diagnosis Date  . Hypertension   . Hypercholesteremia     managed by dr Waynard Edwards  . 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   . Thoracic aortic aneurysm [facility])     last scan in Jan 2013. Now measuring 5.0cm. Referred to Dr. Tyrone Sage  . Malaria   . SVT (supraventricular tachycardia) (HCC)     remote episode during exercise test  . Lung nodules     noted on past CT scans  . Hx of Bell's palsy     left face  . Wears glasses   . Wears dentures     bottom  . History of pneumonia     75 years old  . GERD (gastroesophageal reflux disease)   . Arthritis   . Coronary artery disease     Stent 2004  . History of nuclear stress test     Myoview 5/17 - EF 48%, no scar, no ischemia. Low Risk  . History of echocardiogram     Echo 5/17:  mild LVH, EF 50-55%, no RWMA, Gr 2 DD, mild to mod AI, PASP 32 mmHg    Past Surgical History  Procedure Laterality Date  . Cervical fusion  03-2010    c2-c7  . Rotator cuff surgery  1997    lt  . Elbow surgery  1997    lt and rt  . Retinal detachment surgery  1975    lt  . Coronary stent placement  2004  . Tonsillectomy    . Eye surgery    . Colonoscopy      x4  . Shoulder arthroscopy  2/15    right-got cardiac clearance-gsc  . Trigger finger release Left 10/12/2013    Procedure: LEFT LONG FINGER TRIGGER RELEASE;  Surgeon: Jodi Marble, MD;  Location: [facility];  Service: Orthopedics;  Laterality: Left;  . Dental implant      x 2  . Hip pinning,cannulated Right 05/11/2014    Procedure: CANNULATED HIP PINNING;  Surgeon: Sheral Apley, MD;  Location: [facility] OR;  Service: Orthopedics;  Laterality: Right;    Current Outpatient Prescriptions  Medication Sig Dispense Refill  . aspirin 325 MG tablet Take 325 mg by mouth daily after supper.     Marland Kitchen  Coenzyme Q10 (COQ-10) 200 MG CAPS Take 200 mg by mouth daily after supper.     . colesevelam (WELCHOL) 625 MG tablet Take 3,750 mg by mouth daily after supper. 6 tablets    . ezetimibe (ZETIA) 10 MG tablet Take 5 mg by mouth daily. Takes half tab of 10mg     . fluticasone (FLONASE) 50 MCG/ACT nasal spray Place 1 spray into the nose 2 (two) times daily as needed (seasonal allergies/ drainage).     . Glucosamine-Chondroit-Vit C-Mn (GLUCOSAMINE CHONDR 1500 COMPLX) CAPS Take 2 capsules by mouth daily after supper.     . hydrocortisone 2.5 % cream Apply 1 application topically as needed (RASH). Use on head    . lisinopril (PRINIVIL,ZESTRIL) 40 MG tablet Take 40 mg by mouth daily after supper.     . metroNIDAZOLE (METROGEL) 0.75 % gel Apply 1 application topically 2 (two) times daily.     . Multiple Vitamin (MULTIVITAMIN WITH MINERALS) TABS tablet Take 1 tablet by mouth daily after supper.    . nitroGLYCERIN (NITROSTAT) 0.4 MG SL  tablet Place 1 tablet (0.4 mg total) under the tongue every 5 (five) minutes as needed for chest pain. 25 tablet 11  . omeprazole (PRILOSEC) 20 MG capsule Take 20 mg by mouth daily after supper.     . Pitavastatin Calcium 4 MG TABS Take 2 mg by mouth daily after supper. Takes half of 4mg  tab     No current facility-administered medications for this visit.    Allergies  Allergen Reactions  . Vesicare [Solifenacin Succinate] Itching and Rash  . Statins Other (See Comments)    myalgias  . Promethazine Hcl Other (See Comments)    Agitation and incoherence (phenergan)    Social History   Social History  . Marital Status: Married    Spouse Name: N/A  . Number of Children: 1  . Years of Education: N/A   Occupational History  . retired Engineer, siteschool teacher    Social History Main Topics  . Smoking status: Former Smoker    Quit date: 04/09/1969  . Smokeless tobacco: Current User    Types: Chew     Comment: reports that when he smoked it was intermittent  . Alcohol Use: 0.5 oz/week    1 Standard drinks or equivalent per week     Comment: occasional mainly during holiday  . Drug Use: No  . Sexual Activity: Yes   Other Topics Concern  . Not on file   Social History Narrative    Family History  Problem Relation Age of Onset  . Heart disease Father   . Heart attack Father     4545  . Hypertension Neg Hx   . Stroke Neg Hx   . Cancer Mother 5589    LUNG    Review of Systems:  As stated in the HPI and otherwise negative.   BP 120/72 mmHg  Pulse 64  Ht 6\' 7"  (2.007 m)  Wt 272 lb (123.378 kg)  BMI 30.63 kg/m2  SpO2 96%  Physical Examination: General: Well developed, well nourished, NAD HEENT: OP clear, mucus membranes moist SKIN: warm, dry. No rashes. Neuro: No focal deficits Musculoskeletal: Muscle strength 5/5 all ext Psychiatric: Mood and affect normal Neck: No JVD, no carotid bruits, no thyromegaly, no lymphadenopathy. Lungs:Clear bilaterally, no wheezes, rhonci,  crackles Cardiovascular: Regular rate and rhythm. No murmurs, gallops or rubs. Abdomen:Soft. Bowel sounds present. Non-tender.  Extremities: No lower extremity edema. Pulses are 2 + in the bilateral DP/PT.  Cardiac  cath 06/08/11: Left main: There are separate ostia for the LAD and Circumflex.  Left Anterior Descending Artery: Large vessel that courses to the apex. No obstructive disease noted.Small diagonal branch.  Circumflex Artery: Large caliber vessel with no obstructive disease noted.  Right Coronary Artery: Very large, dominant vessel with patent proximal stent. There is mild plaque disease in the mid and distal RCA. The small caliber PDA has 60% stenosis.  Left Ventricular Angiogram: Dilated LV. LVEF=50%.  Aortic root: Dilated aortic root and ascending aorta.  Impression:  1. 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 Preserved LV systolic function  3. 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   Echo 09/01/15: Left ventricle: The cavity size was mildly dilated. Wall  thickness was increased in a pattern of mild LVH. Systolic  function was at the lower limits of normal. The estimated  ejection fraction was in the range of 50% to 55%. Wall motion was  normal; there were no regional wall motion abnormalities.  Features are consistent with a pseudonormal left ventricular  filling pattern, with concomitant abnormal relaxation and  increased filling pressure (grade 2 diastolic dysfunction). - Aortic valve: There was mild to moderate regurgitation directed  centrally in the LVOT, eccentrically in the LVOT, and towards the  mitral anterior leaflet. - Pulmonary arteries: PA peak pressure: 32 mm Hg (S).  EKG:  EKG is not ordered today. The ekg ordered today demonstrates   Recent Labs: 08/02/2015: BUN 15; Creat 0.92; Hemoglobin 15.2; Platelets 214; Potassium 4.1; Sodium 142   Lipid Panel Followed in primary care   Wt Readings from Last 3 Encounters:  09/02/15 272  lb (123.378 kg)  08/16/15 275 lb (124.739 kg)  08/02/15 275 lb (124.739 kg)     Other studies Reviewed: Additional studies/ records that were reviewed today include: . Review of the above records demonstrates:    Assessment and Plan:   1. CAD: He has no angina. Last cath March 2013 with stable disease. Nuclear stress test 08/16/15 with no ischemia. Will continue ASA, statin. No beta blocker due to bradycardia.   2. Thoracic Aortic aneurysm: Followed by Dr. Tyrone Sage. No plans for surgical intervention as this has been stable for years. No beta blocker at this time secondary to bradycardia. BP is well controlled.   3. HTN: BP well controlled. No changes today.   4. HLD: Managed in primary care. Continue statin.   Current medicines are reviewed at length with the patient today.  The patient does not have concerns regarding medicines.  The following changes have been made:  no change  Labs/ tests ordered today include:   No orders of the defined types were placed in this encounter.    Disposition:   FU with me in 12 months  Signed, Verne Carrow, MD 09/02/2015 9:45 AM    [facility] [address] [place], [place], [place]  [address] Phone: [phone]; Fax: [phone]

## 2015-09-02 NOTE — Telephone Encounter (Signed)
Pt notified of echo results and findings by phone with verbal understanding. Will forward echo results to Dr. Waynard EdwardsPerini.

## 2015-09-02 NOTE — Patient Instructions (Signed)

## 2016-02-04 IMAGING — RF DG HIP (WITH PELVIS) OPERATIVE*R*
1 series · 2 of 2 positions shown · non-contrast
Comparison: Right hip series and April 29, 2014.

CLINICAL DATA: Right hip pinning.

EXAM:
OPERATIVE right HIP (WITH PELVIS IF PERFORMED) 2 VIEWS
TECHNIQUE: Fluoroscopic spot image(s) were submitted for interpretation
post-operatively. Fluoroscopy time reported was 48 seconds.

[Series 1: run · 2 of 2 slices shown]
[im 1/2]
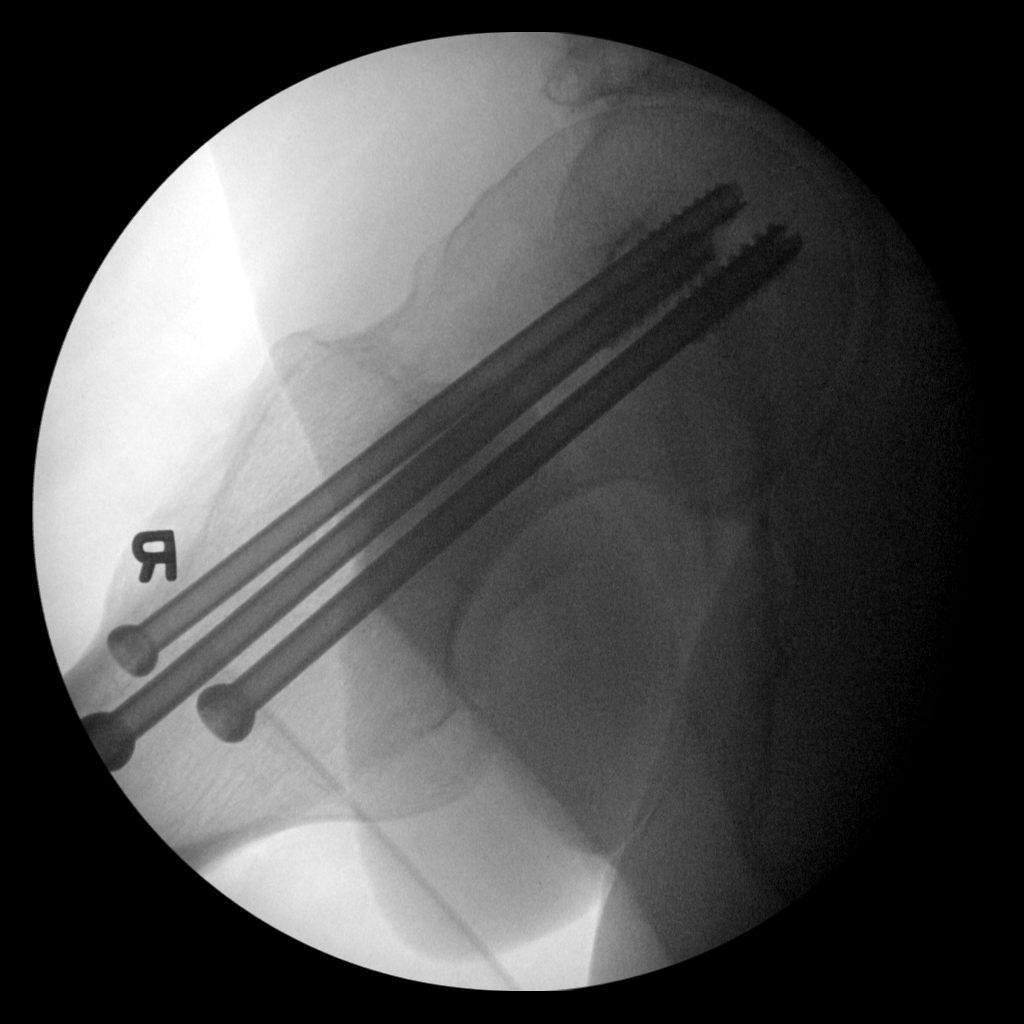
[im 2/2]
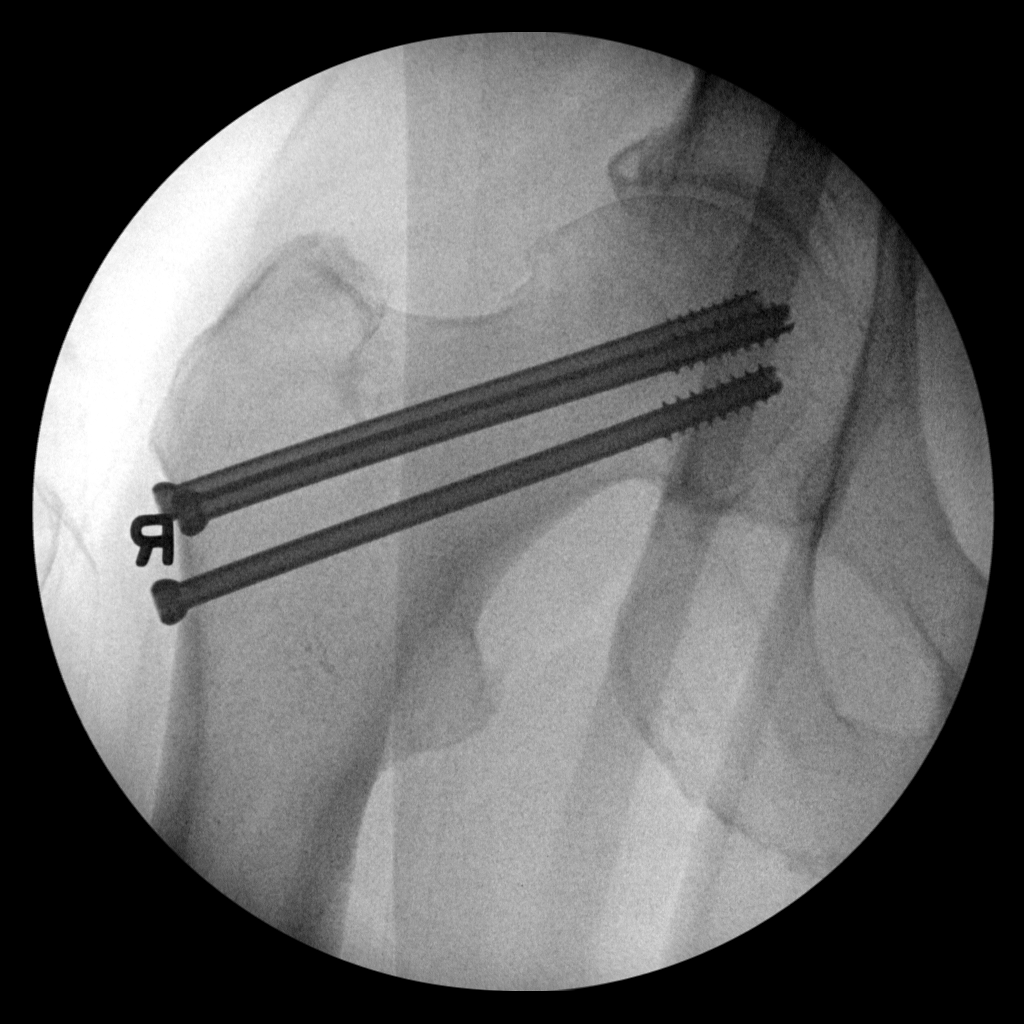

[2 of 2 positions shown; findings below may reference images not displayed]

FINDINGS: Two fluoro spot films reveal the patient to of undergone placement
of metallic screws along the axis of the femoral neck extending to
the femoral head. Alignment is anatomic.
IMPRESSION: The patient has undergone ORIF for right hip fracture. There is no
evidence of immediate postprocedure complication.

## 2016-05-04 ENCOUNTER — Other Ambulatory Visit: Payer: Self-pay | Admitting: Cardiothoracic Surgery

## 2016-05-04 DIAGNOSIS — R918 Other nonspecific abnormal finding of lung field: Secondary | ICD-10-CM

## 2016-06-07 ENCOUNTER — Ambulatory Visit
Admission: RE | Admit: 2016-06-07 | Discharge: 2016-06-07 | Disposition: A | Discharge status: Home / Self Care | Payer: Medicare Other | Source: Ambulatory Visit | Attending: Cardiothoracic Surgery | Admitting: Cardiothoracic Surgery

## 2016-06-07 ENCOUNTER — Encounter: Payer: Self-pay | Admitting: Cardiothoracic Surgery

## 2016-06-07 ENCOUNTER — Ambulatory Visit (INDEPENDENT_AMBULATORY_CARE_PROVIDER_SITE_OTHER): Payer: Medicare Other | Admitting: Cardiothoracic Surgery

## 2016-06-07 VITALS — BP 112/75 | HR 80 | Resp 16 | Ht 79.0 in | Wt 270.0 lb

## 2016-06-07 DIAGNOSIS — R918 Other nonspecific abnormal finding of lung field: Secondary | ICD-10-CM

## 2016-06-07 DIAGNOSIS — I712 Thoracic aortic aneurysm, without rupture, unspecified: Secondary | ICD-10-CM

## 2016-06-07 MED ORDER — IOPAMIDOL (ISOVUE-300) INJECTION 61%
75.0000 mL | Freq: Once | INTRAVENOUS | Status: AC | PRN
  Administered 2016-06-07: 75 mL via INTRAVENOUS

## 2016-06-07 NOTE — Progress Notes (Signed)
[address]       [place] [address]             [phone]                                                       Ronald [name] [facility] Medical Record #[id number] Date of Birth: October 11, 1940  Referring: Kathleene Hazel* Primary Care: Ezequiel Kayser, MD  Chief Complaint:    Dilated aorta    History of Present Illness:    Patient is a 76 year old male with known coronary occlusive disease having a stents placed in his right coronary artery 12  years ago. He is also known to have a dilated ascending aorta this is been followed since 2004 with serial CT scans. He returns today for follow up CTA  of the chest. Scans dating back to 2004 had been very consistent without change at 4.5 cm. The patient has no history of aortic insufficiency has no signs or symptoms of congestive heart failure he has no angina. There is no family history of aortic aneurysm or aortic dissections. His father died at age 72 in 44 sudden death while at a picnic attributed to "cardiac spasm". The patient does chew tobacco, previously smoked cigarettes more than 25 years ago for 1 year.   His primary complaint remains chronic back pain and knee pain making it difficult to be active.  Current Activity/ Functional Status: Patient is independent with mobility/ambulation, transfers, ADL's, IADL's.   Past Medical History  Diagnosis Date  . Hypertension   . Hypercholesteremia     managed by dr Waynard Edwards  . 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   . Thoracic aortic aneurysm     last scan in Jan 2013. Now measuring 5.0cm. Referred to Dr. Tyrone Sage  . Malaria  patient had 3 episodes of malaria while serving in [place]   . SVT (supraventricular tachycardia)     remote episode during exercise test  . Lung nodules     noted on past CT scans    Past Surgical History:  Procedure  Laterality Date  . CERVICAL FUSION  03-2010   c2-c7  . COLONOSCOPY     x4  . CORONARY STENT PLACEMENT  2004  . dental implant     x 2  . ELBOW SURGERY  1997   lt and rt  . EYE SURGERY    . HIP PINNING,CANNULATED Right 05/11/2014   Procedure: CANNULATED HIP PINNING;  Surgeon: Sheral Apley, MD;  Location: [facility] OR;  Service: Orthopedics;  Laterality: Right;  . RETINAL DETACHMENT SURGERY  1975   lt  . rotator cuff surgery  1997   lt  . SHOULDER ARTHROSCOPY  2/15   right-got cardiac clearance-gsc  . TONSILLECTOMY    . TRIGGER FINGER RELEASE Left 10/12/2013   Procedure: LEFT LONG FINGER TRIGGER RELEASE;  Surgeon: Jodi Marble, MD;  Location: [facility];  Service: Orthopedics;  Laterality: Left;    Family History  Problem Relation Age of Onset  . Heart disease Father   . Heart attack Father     64  . Hypertension Neg Hx   .  Stroke Neg Hx   . Cancer Mother 58    LUNG    History   Social History  . Marital Status: Married    Spouse Name: N/A    Number of Children: 1  . Years of Education: N/A   Occupational History  . 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 Smoking status: Former Smoker    Quit date: 04/09/1969  . Smokeless tobacco: Former Neurosurgeon    Types: Chew    Quit date: 03/09/2010  . Alcohol Use: 0.5 oz/week    1 drink(s) per week     occasional  . Drug Use: No  . Sexually Active: Yes    Social History Narrative       History  Smoking Status  . Former Smoker  . Quit date: 04/09/1969  Smokeless Tobacco  . Current User  . Types: Chew    Comment: reports that when he smoked it was intermittent    History  Alcohol Use  . 0.5 oz/week  . 1 Standard drinks or equivalent per week    Comment: occasional mainly during holiday     Allergies  Allergen Reactions  . Vesicare [Solifenacin Succinate] Itching and Rash  . Statins Other (See Comments)    myalgias  . Promethazine  Hcl Other (See Comments)    Agitation and incoherence (phenergan)    Current Outpatient Prescriptions  Medication Sig Dispense Refill  . aspirin 325 MG tablet Take 325 mg by mouth daily after supper.     . Coenzyme Q10 (COQ-10) 200 MG CAPS Take 200 mg by mouth daily after supper.     . colesevelam (WELCHOL) 625 MG tablet Take 3,750 mg by mouth daily after supper. 6 tablets    . ezetimibe (ZETIA) 10 MG tablet Take 5 mg by mouth daily. Takes half tab of 10mg     . fluticasone (FLONASE) 50 MCG/ACT nasal spray Place 1 spray into the nose 2 (two) times daily as needed (seasonal allergies/ drainage).     . Glucosamine-Chondroit-Vit C-Mn (GLUCOSAMINE CHONDR 1500 COMPLX) CAPS Take 2 capsules by mouth daily after supper.     . hydrocortisone 2.5 % cream Apply 1 application topically as needed (RASH). Use on head    . lisinopril (PRINIVIL,ZESTRIL) 40 MG tablet Take 40 mg by mouth daily after supper.     . metroNIDAZOLE (METROGEL) 0.75 % gel Apply 1 application topically 2 (two) times daily.     . Multiple Vitamin (MULTIVITAMIN WITH MINERALS) TABS tablet Take 1 tablet by mouth daily after supper.    . nitroGLYCERIN (NITROSTAT) 0.4 MG SL tablet Place 1 tablet (0.4 mg total) under the tongue every 5 (five) minutes as needed for chest pain. 25 tablet 11  . omeprazole (PRILOSEC) 20 MG capsule Take 20 mg by mouth daily after supper.     . Pitavastatin Calcium 4 MG TABS Take 2 mg by mouth daily after supper. Takes half of 4mg  tab     No current facility-administered medications for this visit.        Review of Systems:     Cardiac Review of Systems: Y or N  Chest Pain [  n  ]  Resting SOB [ n  ] Exertional SOB  [ n ]  Orthopnea [n  ]   Pedal Edema [  mild ]    Palpitations [ n ] Syncope  [ n ]   Presyncope [ n  ]  General Review of Systems: [Y] = yes [  ]=no Constitional: recent weight change [  ]; anorexia [  ]; fatigue [  ]; nausea [  ]; night sweats [  ]; fever [  ]; or chills [  ];                                                                                                                                           Dental: poor dentition[ y caps]; Last Dentist visit: last 6 months  Eye : blurred vision [  ]; diplopia [   ]; vision changes [  ];  Amaurosis fugax[  ]; Resp: cough [  ];  wheezing[  ];  hemoptysis[  ]; shortness of breath[  ]; paroxysmal nocturnal dyspnea[  ]; dyspnea on exertion[  ]; or orthopnea[  ];  GI:  gallstones[  ], vomiting[  ];  dysphagia[  ]; melena[  ];  hematochezia [  ]; heartburn[  ];   Hx of  Colonoscopy[y  ]; GU: kidney stones [  ]; hematuria[  ];   dysuria [ y ];  nocturia[  y];  history of     obstruction [ n ];             Skin: rash, swelling[  ];, hair loss[  ];  peripheral edema[  ];  or itching[  ]; Musculosketetal: myalgias[  ];  joint swelling[  ];  joint erythema[  ];  joint pain[  ];  back pain[  ];  Heme/Lymph: bruising[  ];  bleeding[  ];  anemia[  ];  Neuro: TIA[  ];  headaches[  ];  stroke[  ];  vertigo[  ];  seizures[  ];   paresthesias[  ];  difficulty walking[ yes, chronic back pain and bilaterial knee pain ];  Psych:depression[  ]; anxiety[  ];  Endocrine: diabetes[ n ];  thyroid dysfunction[ n ];  Immunizations: Flu [ y ]; Pneumococcal[y  ]; and shingles  Other:  Physical Exam: BP 112/75 (BP Location: Left Arm, Patient Position: Sitting, Cuff Size: Large)   Pulse 80   Resp 16   Ht 6\' 7"  (2.007 m)   Wt 270 lb (122.5 kg)   SpO2 97% Comment: RA  BMI 30.42 kg/m   General appearance: alert, cooperative, appears stated age and no distress Neurologic: intact Heart: regular rate and rhythm, S1, S2 normal, no murmur, click, rub or gallop, no mur of AI Lungs: clear to auscultation bilaterally Abdomen: soft, non-tender; bowel sounds normal; no masses,  no organomegaly Extremities: extremities normal, atraumatic, no cyanosis or edema, Homans sign is negative, no sign of DVT and varicose veins noted no adenopathy cervical or  axillary Patient has large body stature with a body surface area of 2.6 however he has no features of Marfan's,  Diagnostic Studies & Laboratory data:     Recent Radiology Findings:  Ct  Chest W Contrast  Result Date: 06/07/2016 CLINICAL DATA:  Lung nodules.  Known thoracic aneurysm. EXAM: CT CHEST WITH CONTRAST TECHNIQUE: Multidetector CT imaging of the chest was performed during intravenous contrast administration. CONTRAST:  75mL ISOVUE-300 IOPAMIDOL (ISOVUE-300) INJECTION 61% COMPARISON:  05/26/2015.  12/02/2014. FINDINGS: Cardiovascular: Heart size upper normal. Coronary artery calcification is noted. Atherosclerotic calcification is noted in the wall of the thoracic aorta. Ascending thoracic aorta measures 4.7 cm diameter. Mediastinum/Nodes: No mediastinal lymphadenopathy. No evidence for gross hilar lymphadenopathy although assessment is limited by the lack of intravenous contrast on today's study. The esophagus has normal imaging features. There is no axillary lymphadenopathy. Lungs/Pleura: Multiple tiny bilateral pulmonary nodules are again identified. Imaging today is degraded by breathing motion which obscures fine architectural detail in the lungs. Within this limitation, no change in the numerous bilateral scattered pulmonary nodules is apparent. Previous study compare these nodules back to an exam from 05/09/2009 and demonstrates stability, consistent with benign process. No new or progressive pulmonary nodule on today's study. No focal airspace consolidation. No pulmonary edema or pleural effusion. Upper Abdomen: Unremarkable. Musculoskeletal: Sclerotic lesion left L2 transverse process stable back to a study from 02/24/2015. IMPRESSION: 1. Continued interval stability of numerous tiny bilateral pulmonary nodules. 2. Stable aneurysm of the ascending thoracic aorta, measuring 4.7 cm today compared to 4.8 cm previously. Recommend semi-annual imaging followup by CTA or MRA and referral to  cardiothoracic surgery if not already obtained. This recommendation follows 2010 ACCF/AHA/AATS/ACR/ASA/SCA/SCAI/SIR/STS/SVM Guidelines for the Diagnosis and Management of Patients With Thoracic Aortic Disease. Circulation. 2010; 121: [id number]. 3. Coronary artery atherosclerosis. Electronically Signed   By: Kennith CenterEric  Mansell M.D.   On: 06/07/2016 16:00   Ct Angio Chest Aorta W/cm &/or Wo/cm  12/02/2014   CLINICAL DATA:  Follow-up lung nodule and thoracic aortic aneurysm, ex-smoker.  EXAM: CT ANGIOGRAPHY CHEST WITH CONTRAST  TECHNIQUE: Multidetector CT imaging of the chest was performed using the standard protocol during bolus administration of intravenous contrast. Multiplanar CT image reconstructions and MIPs were obtained to evaluate the vascular anatomy.  CONTRAST:  75 cc of Isovue 370  COMPARISON:  Chest CTs dated 08/06/2014, 12/03/2013, and 11/13/2012.  FINDINGS: The benign 4 mm pulmonary nodule within the left lateral lingula is stable. The 5 mm pulmonary nodule within the medial aspects of the superior segment of the right lower lobe is stable compared to the chest CT of 08/06/2014 and is also stable compared to the earlier study of 11/13/2012 indicating benignity.  There is a new 6 mm pulmonary nodule within the lateral aspects of the right lower lobe and there is a new 5 mm pulmonary nodule within the medial aspects of the right lower lobe (series 5, images 41 and 40 respectively). There is an additional questionable pulmonary nodule within the right lower lobe posteriorly measuring 7 mm (series 5, image 38). No other new lung findings seen.  The dilatation of the ascending thoracic aorta is unchanged at 4.6 cm diameter. Distal thoracic aortic arch is also again mildly prominent measuring 3.7 cm diameter, unchanged. No aortic dissection.  Heart size is upper normal, unchanged. Coronary artery calcifications noted within the left anterior descending and left circumflex coronary arteries. No mass or enlarged  lymph nodes seen within the mediastinum or perihilar regions. Supraclavicular regions are unremarkable. Limited images of the upper abdomen are unremarkable. Degenerative changes within the thoracic spine are unchanged. No acute osseous abnormality.  Review of the MIP images confirms the above findings.  IMPRESSION: 1. New pulmonary nodules within  the right lower lobe, as detailed above. These include a new 6 mm pulmonary nodule within the lateral aspects of the right lower lobe and a new 5 mm pulmonary nodule within the medial aspects of the right lower lobe. There is also a questionable pulmonary nodule within the right lower lobe posteriorly measuring 7 mm greatest dimension. As the patient is at high risk for bronchogenic carcinoma, follow-up chest CT at 3-48months is recommended. This recommendation follows the consensus statement: Guidelines for Management of Small Pulmonary Nodules Detected on CT Scans: A Statement from the Fleischner Society as published in Radiology 2005; 237:395-400. 2. Remainder of the pulmonary nodules have been stable for at least 2 years indicating benignity. 3. Stable ascending thoracic aortic aneurysm measuring 4.6 cm diameter. 4. Coronary artery calcifications.   Electronically Signed   By: Bary Richard M.D.   On: 12/02/2014 14:26  I have independently reviewed the above radiology studies  and reviewed the findings with the patient.   12/03/2013   CLINICAL DATA:  Thoracic aortic aneurysm.  EXAM: CT ANGIOGRAPHY CHEST WITH CONTRAST  TECHNIQUE: Multidetector CT imaging of the chest was performed using the standard protocol during bolus administration of intravenous contrast. Multiplanar CT image reconstructions and MIPs were obtained to evaluate the vascular anatomy.  CONTRAST:  80mL OMNIPAQUE IOHEXOL 350 MG/ML SOLN  COMPARISON:  CT scan of November 13, 2012 and December 26, 2011.  FINDINGS: No pneumothorax or pleural effusion is noted. Stable 4.5 mm nodule is noted in left lingula  laterally which is unchanged compared with 2013 and can be considered benign at this point. 4.8 cm nodule is noted in the medial portion of the superior segment of the right lower lobe best seen on image number 26 of series 5. This is unchanged compared to prior exam. No acute pulmonary disease is noted. Coronary artery calcifications are again noted. No significant mediastinal mass or adenopathy is noted. Ascending thoracic aorta has maximum measured diameter 4.6 cm which is not changed compared to prior exam. No dissection or significant atheromatous disease is noted. Normal caliber of descending thoracic aorta is noted. Visualized portion of upper abdomen appears normal. The great vessels appear to be widely patent without significant stenosis. No significant osseous abnormality is noted.  Review of the MIP images confirms the above findings.  IMPRESSION: 4.6 cm ascending thoracic aortic aneurysm is noted which is unchanged compared to prior exam.  Coronary artery calcifications are noted consistent with coronary artery disease.  Stable stable 4.8 cm nodule seen in superior segment of right lower lobe ; followup chest CT in 12 months is recommended to ensure stability.   Electronically Signed   By: Roque Lias M.D.   On: 12/03/2013 14:47   I have discussed with radiology and reviewed the films, should be 4.8 mm not cm nodule  Ct Angio Chest Aorta W/cm &/or Wo/cm  11/13/2012   *RADIOLOGY REPORT*  Clinical Data: Aortic aneurysm  CT ANGIOGRAPHY CHEST  Technique:  Multidetector CT imaging of the chest using the standard protocol during bolus administration of intravenous contrast. Multiplanar reconstructed images including MIPs were obtained and reviewed to evaluate the vascular anatomy.  Contrast: OMNIPAQUE IOHEXOL 350 MG/ML SOLN  Comparison: 12/26/2011  Findings: Aortic diameter at the sinus of Valsalva, sinotubular junction, and ascending aorta are 5.0 cm, 3.7 cm, and 4.6 cm respectively.  No evidence  of aortic dissection or transection. Little if any plaque within the thoracic aorta.  Innominate artery, right vertebral artery, right subclavian artery, right  common carotid artery, left common carotid artery, left subclavian artery, and left vertebral artery are all widely patent within the confines of the examination.  Right coronary artery stent is noted.  Calcification in the circumflex and left anterior descending coronary vessels are noted. Distal right coronary artery calcifications are noted.  No obvious filling defects in the pulmonary arterial tree to suggest acute pulmonary thromboembolism.  Negative abnormal mediastinal adenopathy.  No pericardial effusion.  Sub centimeter right thyroid hypodensity on image 17 of series 4.  No pneumothorax.  No pleural effusion.  Calcified lymph nodes in the mediastinum or are noted.  Sub centimeter pulmonary nodules are stable.  Cervical spine fusion hardware is in place.  The right-sided screw within the C7 vertebral body has retracted and is within the prevertebral soft tissues.  Images of the upper abdomen demonstrate diffuse hepatic steatosis. Prominence of the adrenal glands without focal mass is stable.  IMPRESSION: Maximal diameter of the ascending aorta is 4.6 cm.  Previously, it was measured at 4.9 cm.  It is therefore not enlarging.  No evidence of aortic dissection.  Sub centimeter right thyroid hypodensity is stable in retrospect compared with a study from 2012.  This supports benign etiology.  Cervical spine fusion hardware is in place.  One of the screws in C7 has retracted and is residing within the prevertebral soft tissues.   Original Report Authenticated By: Jolaine Click, M.D.    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  Follow-up study.  CT ANGIOGRAPHY OF THE HEART, CORONARY ARTERY, STRUCTURE, AND MORPHOLOGY   CONTRAST:  100 ml of Omnipaque-300.  COMPARISON:  CTA chest 05/14/2011.  TECHNIQUE:  CT angiography of the coronary vessels was performed on a 256 channel system using prospective ECG gating.  A scout and noncontrast exam (for calcium scoring) were performed.  Circulation time was measured using a test bolus.  Coronary CTA was performed with sub mm slice collimation during portions of the cardiac cycle after prior injection of iodinated contrast.  Imaging post processing was performed on an independent workstation creating multiplanar and 3-D images, and quantitative analysis of the heart and coronary arteries.  Note that this exam targets the heart and the chest was not imaged in its entirety.  PREMEDICATION: Lopressor 5 mg, IV Nitroglycerin 400 mcg, sublingual.  FINDINGS: Technical quality:  Good  Heart rate:  55-60  CORONARY ARTERIES: Left main coronary artery:  Not present (there are separate ostia for the left anterior descending and left circumflex coronary arteries directly off the left sinus of Valsalva). Left anterior descending:  Mildly diseased with mixed calcified noncalcified atherosclerotic plaque with areas of stenosis up to 25 - 50% in the mid LAD. Left circumflex:  Large caliber vessel that quickly gives rise to two large obtuse marginal vessels.  Proximal left circumflex demonstrates only minimal calcified plaque but no associated stenosis. Obtuse Marginal 1: Mildly diseased with calcified plaque with no associated luminal stenosis. Obtuse Marginal 2: Extensive calcified atherosclerotic plaque proximally with stenosis >50% (possibly >75%), however, secondary to the high degree of calcification, accurate assessment for luminal stenosis is not possible on this examination. Right coronary artery:  There is a stent in the proximal right coronary artery which is grossly patent.  There is some calcified plaque at the distal end of the stent where the vessel is slightly acutely angulated.  There appears  to  be some stenosis in this region, however, it is favored to be approximately 25 - 50% (accurate assessment is limited by the high-density the stent material and the heavily calcified plaque).  The remainder of the RCA is otherwise mildly diseased with nonobstructive plaque. Posterior descending artery:  Patent with a mixed calcified noncalcified plaque.  Secondary to the small size of the vessel and excessive image noise, accurate assessment for stenosis is not possible on this examination, however, there appears to be at least 25 - 50% stenosis of the mid PDA. Dominance:  Right  CORONARY CALCIUM: Total Agatston Score:  655 MESA database percentile:  80th Comment - it appears as though the patient has moved or breathed during image acquisition, such that the a portion of the coronary arteries was incompletely visualized.  This likely underestimates the patient's true calcium score.  AORTA AND PULMONARY MEASUREMENTS: Aortic root (21 - 40 mm):             29 mm  at the annulus             49 mm  at the sinuses of Valsalva         36 mm  at the sinotubular junction Ascending aorta ( <  40 mm):  50 mm Aortic arch : 34 mm Descending aorta:  28 mm Main pulmonary artery:  ( <  30 mm):  35 mm  EXTRACARDIAC FINDINGS: Several tiny pulmonary nodules measuring 4 mm are scattered throughout the lungs bilaterally and are completely unchanged compared to prior examinations (dating back to 2007); these can be considered radiographically benign requiring no further imaging follow-up.  Small calcified granulomas are also noted in the posterior aspect of the left upper lobe.  Multiple calcified left hilar and mediastinal lymph nodes are noted.  No acute consolidative airspace disease.  No pleural effusions.  Visualized portions of the upper abdomen are unremarkable.  IMPRESSION:  1. Ascending thoracic aortic aneurysm is similar in size measuring 4.9 cm on today's examination.  No evidence of thoracic aortic dissection at this time.  2.  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  The patient's aortic valve appears to be tricuspid.  Additionally, there is no effacement of the sinotubular junction. 3.  Multivessel coronary artery disease, as detailed above.  Most importantly, there is a heavily calcified plaque in the obtuse marginal 2 branch (a large vessel) which appears to be stenotic (i.e., at least 50% diameter stenosis, and possbily in excess of 75%), however, accurate assessment for percentage stenosis is limited on this examination secondary to the high degree of calcification. Clinical correlation is recommended, with consideration for further evaluation with stress testing if there is clinical concern for inducible ischemia. 4.  Coronary artery calcium scoring is at least 655 (the patient moved during the image acquisition, resulting in incomplete coverage of the coronary arteries which likely underestimates the true calcium score), which is 80th percentile for patient's of matched age, gender and race/ethnicity. 5.  Right coronary artery dominance.   Original Report Authenticated By: Florencia Reasons, M.D.     Recent Lab Findings: Lab Results  Component Value Date   WBC 7.7 08/02/2015   HGB 15.2 08/02/2015   HCT 44.3 08/02/2015   PLT 214 08/02/2015   GLUCOSE 87 08/02/2015   CHOL 184 11/14/2010   TRIG 143.0 11/14/2010   HDL 48.60 11/14/2010   LDLCALC 107 (H) 11/14/2010   ALT 16 08/06/2014  AST 22 08/06/2014   NA 142 08/02/2015   K 4.1 08/02/2015   CL 105 08/02/2015   CREATININE 0.92 08/02/2015   BUN 15 08/02/2015   CO2 20 08/02/2015   INR 1.0 06/06/2011    Procedure Performed:  1. Left Heart Catheterization 2. Selective Coronary Angiography 3. Left ventricular angiogram 4. Aortic root angiogram Operator: Verne Carrow, MD  Indication: Pt with known CAD and thoracic aortic aneurysm. The aortic aneurysm has enlarged over the last year  and we recently referred Mr. Olazabal to see Dr. Tyrone Sage. Plans are being made for aortic root replacement. Cardiac cath today to exclude progression of CAD.  Procedure Details:  The risks, benefits, complications, treatment options, and expected outcomes were discussed with the patient. The patient and/or family concurred with the proposed plan, giving informed consent. The patient was brought to the cath lab after IV hydration was begun and oral premedication was given. The patient was further sedated with Versed and Fentanyl. The right groin was prepped and draped in the usual manner. Using the modified Seldinger access technique, a 4 French sheath was placed in the right femoral artery. A JL-5 catheter was used to engage the LAD with sub-selective shots of the Circumflex. An AL-1 catheter was used to non-selectively inject the Circumflex artery. A 3DRC catheter was used to engage and inject the RCA. A pigtail catheter was used to perform a left ventricular angiogram. The catheter was pulled back across the aortic valve. An aortic root angiogram was performed.  There were no immediate complications. The patient was taken to the recovery area in stable condition.  Hemodynamic Findings:  Central aortic pressure: 130/72  Left ventricular pressure: 130/17/24  Angiographic Findings:  Left main: There are separate ostia for the LAD and Circumflex.  Left Anterior Descending Artery: Large vessel that courses to the apex. No obstructive disease noted.Small diagonal branch.  Circumflex Artery: Large caliber vessel with no obstructive disease noted.  Right Coronary Artery: Very large, dominant vessel with patent proximal stent. There is mild plaque disease in the mid and distal RCA. The small caliber PDA has 60% stenosis.  Left Ventricular Angiogram: Dilated LV. LVEF=50%.  Aortic root: Dilated aortic root and ascending aorta.  Impression:  1. 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 Preserved LV systolic  function  3. 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     08/2015:   Result status: Final result                           [facility]                        [address]                        [place], [place] [address]                            [phone]  ------------------------------------------------------------------- Transthoracic Echocardiography  Patient:    Shyhiem, Beeney MR #:       [id number] Study Date: 09/01/2015 Gender:     M Age:        74 Height:     200.7 cm Weight:     124.7 kg BSA:        2.66 m^2 Pt. Status: Room:   ORDERING  Alben Spittle, Scott T  REFERRING    Tereso Newcomer T  SONOGRAPHER  [place], RDCS  ATTENDING    Thurmon Fair, MD  PERFORMING   [facility], Outpatient  cc:  ------------------------------------------------------------------- LV EF: 50% -   55%  ------------------------------------------------------------------- Indications:      Chest pain (R07.89).  ------------------------------------------------------------------- History:   PMH:   Coronary artery disease.  Risk factors:  Thoracic aortic aneurysm. Current tobacco use.  ------------------------------------------------------------------- Study Conclusions  - Left ventricle: The cavity size was mildly dilated. Wall   thickness was increased in a pattern of mild LVH. Systolic   function was at the lower limits of normal. The estimated   ejection fraction was in the range of 50% to 55%. Wall motion was   normal; there were no regional wall motion abnormalities.   Features are consistent with a pseudonormal left ventricular   filling pattern, with concomitant abnormal relaxation and   increased filling pressure (grade 2 diastolic dysfunction). - Aortic valve: There was mild to moderate regurgitation directed   centrally in the LVOT, eccentrically in the LVOT, and towards the   mitral anterior leaflet. - Pulmonary arteries: PA peak  pressure: 32 mm Hg (S).  ------------------------------------------------------------------- Labs, prior tests, procedures, and surgery: Transthoracic echocardiography (01/25/2015).     EF was 60%. Aortic root: 53mm. Ascending aorta 47mm.  Transthoracic echocardiography.  M-mode, complete 2D, spectral Doppler, and color Doppler.  Birthdate:  Patient birthdate: [birth year].  Age:  Patient is 76 yr old.  Sex:  Gender: male. BMI: 31 kg/m^2.  Blood pressure:     120/80  Patient status: Outpatient.  Study date:  Study date: 09/01/2015. Study time: 07:19 AM.  Location:  [facility]  -------------------------------------------------------------------  ------------------------------------------------------------------- Left ventricle:  The cavity size was mildly dilated. Wall thickness was increased in a pattern of mild LVH. Systolic function was at the lower limits of normal. The estimated ejection fraction was in the range of 50% to 55%. Wall motion was normal; there were no regional wall motion abnormalities. Features are consistent with a pseudonormal left ventricular filling pattern, with concomitant abnormal relaxation and increased filling pressure (grade 2 diastolic dysfunction).  ------------------------------------------------------------------- Aortic valve:   Trileaflet.  Doppler:  There was mild to moderate regurgitation directed centrally in the LVOT, eccentrically in the LVOT, and towards the mitral anterior leaflet.  ------------------------------------------------------------------- Aorta:  Aortic root: The aortic root was moderately dilated. Ascending aorta: The ascending aorta was moderately dilated.  ------------------------------------------------------------------- Mitral valve:   Structurally normal valve.   Leaflet separation was normal.  Doppler:  Transvalvular velocity was within the normal range. There was no evidence for stenosis. There was  trivial regurgitation.    Peak gradient (D): 2 mm Hg.  ------------------------------------------------------------------- Left atrium:  The atrium was at the upper limits of normal in size.   ------------------------------------------------------------------- Right ventricle:  The cavity size was normal. Systolic function was normal.  ------------------------------------------------------------------- Pulmonic valve:    The valve appears to be grossly normal. Doppler:  There was no significant regurgitation.  ------------------------------------------------------------------- Tricuspid valve:   Structurally normal valve.   Leaflet separation was normal.  Doppler:  Transvalvular velocity was within the normal range. There was no regurgitation.  ------------------------------------------------------------------- Pulmonary artery:   Systolic pressure was at the upper limits of normal.  ------------------------------------------------------------------- Right atrium:  The atrium was at the upper limits of normal in size.  ------------------------------------------------------------------- Pericardium:  There was no pericardial effusion.  ------------------------------------------------------------------- Measurements   Left ventricle  Value        Reference  LV ID, ED, PLAX chordal          (H)     56.74 mm     43 - 52  LV ID, ES, PLAX chordal          (H)     43.02 mm     23 - 38  LV fx shortening, PLAX chordal   (L)     24    %      >=29  LV PW thickness, ED                      12.2  mm     ---------  IVS/LV PW ratio, ED                      1.01         <=1.3  Stroke volume, 2D                        118   ml     ---------  Stroke volume/bsa, 2D                    44    ml/m^2 ---------  LV ejection fraction, 1-p A4C            53    %      ---------  LV end-diastolic volume, 2-p             125   ml     ---------  LV end-systolic volume,  2-p              64    ml     ---------  LV ejection fraction, 2-p                49    %      ---------  Stroke volume, 2-p                       61    ml     ---------  LV end-diastolic volume/bsa, 2-p         47    ml/m^2 ---------  LV end-systolic volume/bsa, 2-p          24    ml/m^2 ---------  Stroke volume/bsa, 2-p                   23    ml/m^2 ---------  LV e&', lateral                           6.83  cm/s   ---------  LV E/e&', lateral                         10.94        ---------  LV e&', medial                            5.48  cm/s   ---------  LV E/e&', medial                          13.63        ---------  LV e&', average  6.16  cm/s   ---------  LV E/e&', average                         12.14        ---------    Ventricular septum                       Value        Reference  IVS thickness, ED                        12.3  mm     ---------    LVOT                                     Value        Reference  LVOT ID, S                               27    mm     ---------  LVOT area                                5.73  cm^2   ---------  LVOT peak velocity, S                    98.3  cm/s   ---------  LVOT mean velocity, S                    57.9  cm/s   ---------  LVOT VTI, S                              20.6  cm     ---------    Aorta                                    Value        Reference  Aortic root ID, ED                       54    mm     ---------  Aortic root ID, M-L, ED                  50.68 mm     ---------  Aortic root ID, STJ, ED                  38.8  mm     ---------  Ascending aorta ID, A-P, S               50    mm     ---------    Left atrium                              Value        Reference  LA ID, A-P, ES                           38  mm     ---------  LA ID/bsa, A-P                           1.43  cm/m^2 <=2.2  LA volume, S                             53.6  ml     ---------  LA volume/bsa, S                         20.1   ml/m^2 ---------  LA volume, ES, 1-p A4C                   54    ml     ---------  LA volume/bsa, ES, 1-p A4C               20.3  ml/m^2 ---------  LA volume, ES, 1-p A2C                   52.9  ml     ---------  LA volume/bsa, ES, 1-p A2C               19.9  ml/m^2 ---------    Mitral valve                             Value        Reference  Mitral E-wave peak velocity              74.7  cm/s   ---------  Mitral A-wave peak velocity              63.1  cm/s   ---------  Mitral deceleration time         (H)     237   ms     150 - 230  Mitral peak gradient, D                  2     mm Hg  ---------  Mitral E/A ratio, peak                   1.2          ---------    Pulmonary arteries                       Value        Reference  PA pressure, S, DP               (H)     32    mm Hg  <=30    Tricuspid valve                          Value        Reference  Tricuspid regurg peak velocity           247   cm/s   ---------  Tricuspid peak RV-RA gradient            24    mm Hg  ---------    Systemic veins                           Value  Reference  Estimated CVP                            8     mm Hg  ---------    Right ventricle                          Value        Reference  RV pressure, S, DP               (H)     32    mm Hg  <=30  RV s&', lateral, S                        10.1  cm/s   ---------  Legend: (L)  and  (H)  mark values outside specified reference range.  ------------------------------------------------------------------- Prepared and Electronically Authenticated by  Thurmon Fair, MD 2017-05-25T08:56:22     Aortic Size Index=     4.8   /Body surface area is 2.61 meters squared. = 1.8  < 2.75 cm/m2      4% risk per year 2.75 to 4.25          8% risk per year > 4.25 cm/m2    20% risk per year  cross sectional area of aorta cm2/height in meters > 10 consider  surgery 16.6/2= 8.3    Assessment / Plan:   Pulmonary nodules  On further review and repeat  scan today stable since 2011  mild to moderate  aortic regurgitation with tri leaflet aortic valve  Known dilated ascending aorta 4.6-4.8 cm since at least 2004, today Stable aneurysm of the ascending thoracic aorta, measuring 4.7 cm       The symptoms,  risks and lethal nature of acute aortic dissection was explained to him and his wife.Patient cautioned about strenuous lifting with Valsalva.   No beta blocker per cardiology   I plan to see him back in 12 months with repeat CTA of chest     Delight Ovens MD  Beeper [phone] Office [phone] 06/07/2016 4:41 PM

## 2016-10-18 NOTE — Progress Notes (Signed)
Chief Complaint  Patient presents with  . Follow-up    CAD   History of Present Illness:75 yo male with h/o CAD s/p stent RCA 2004, HTN, HLD, thoracic aortic aneurysm here today for cardiac follow up. He has a thoracic aortic aneurysm and is followed yearly by Dr. Tyrone Sage with yearly CTA chest. Last Chest CTA March 2018 with stable dilated ascending aorta. He is known to have CAD and had a stent placed in the RCA in 2004. Last cardiac cath in 2013 with mild disease in the LAD and Circumflex, patent stent proximal RCA and moderate disease in the small PDA. He has not tolerated statins due to muscle aches or beta blockers secondary to bradycardia. Nuclear stress test 08/16/15 with no ischemia. Echo 09/01/15 with normal LV systolic function, mild to moderate AI.   He is here today for follow up. The patient denies any chest pain, dyspnea, palpitations, lower extremity edema, orthopnea, PND, dizziness, near syncope or syncope.   Primary Care Physician: [name], MD  Past Medical History:  Diagnosis Date  . Arthritis   . Coronary artery disease    Stent 2004  . GERD (gastroesophageal reflux disease)   . History of echocardiogram    Echo 5/17: mild LVH, EF 50-55%, no RWMA, Gr 2 DD, mild to mod AI, PASP 32 mmHg  . History of nuclear stress test    Myoview 5/17 - EF 48%, no scar, no ischemia. Low Risk  . History of pneumonia    76 years old  . Hx of Bell's palsy    left face  . Hypercholesteremia    managed by dr Waynard Edwards  . Hypertension   . 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   . Lung nodules    noted on past CT scans  . Malaria   . SVT (supraventricular tachycardia) (HCC)    remote episode during exercise test  . Thoracic aortic aneurysm [facility])    last scan in Jan 2013. Now measuring 5.0cm. Referred to Dr. Tyrone Sage  . Wears dentures    bottom  . Wears glasses     Past Surgical  History:  Procedure Laterality Date  . CERVICAL FUSION  03-2010   c2-c7  . COLONOSCOPY     x4  . CORONARY STENT PLACEMENT  2004  . dental implant     x 2  . ELBOW SURGERY  1997   lt and rt  . EYE SURGERY    . HIP PINNING,CANNULATED Right 05/11/2014   Procedure: CANNULATED HIP PINNING;  Surgeon: Sheral Apley, MD;  Location: [facility] OR;  Service: Orthopedics;  Laterality: Right;  . RETINAL DETACHMENT SURGERY  1975   lt  . rotator cuff surgery  1997   lt  . SHOULDER ARTHROSCOPY  2/15   right-got cardiac clearance-gsc  . TONSILLECTOMY    . TRIGGER FINGER RELEASE Left 10/12/2013   Procedure: LEFT LONG FINGER TRIGGER RELEASE;  Surgeon: Jodi Marble, MD;  Location: [facility];  Service: Orthopedics;  Laterality: Left;    Current Outpatient Prescriptions  Medication Sig Dispense Refill  . Coenzyme Q10 (COQ-10) 200 MG CAPS Take 200 mg by mouth daily after supper.     . colesevelam (WELCHOL) 625 MG tablet Take 3,750 mg by mouth daily after supper. 6 tablets    . ezetimibe (ZETIA) 10 MG tablet Take 5 mg by  mouth daily. Takes half tab of 10mg     . fluticasone (FLONASE) 50 MCG/ACT nasal spray Place 1 spray into the nose 2 (two) times daily as needed (seasonal allergies/ drainage).     . Glucosamine-Chondroit-Vit C-Mn (GLUCOSAMINE CHONDR 1500 COMPLX) CAPS Take 2 capsules by mouth daily after supper.     . hydrocortisone 2.5 % cream Apply 1 application topically as needed (RASH). Use on head    . lisinopril (PRINIVIL,ZESTRIL) 40 MG tablet Take 40 mg by mouth daily after supper.     . metroNIDAZOLE (METROGEL) 0.75 % gel Apply 1 application topically 2 (two) times daily.     . Multiple Vitamin (MULTIVITAMIN WITH MINERALS) TABS tablet Take 1 tablet by mouth daily after supper.    . nitroGLYCERIN (NITROSTAT) 0.4 MG SL tablet Place 1 tablet (0.4 mg total) under the tongue every 5 (five) minutes as needed for chest pain. 25 tablet 11  . omeprazole (PRILOSEC) 20 MG capsule Take 20 mg by  mouth daily after supper.     . Pitavastatin Calcium 4 MG TABS Take 2 mg by mouth daily after supper. Takes half of 4mg  tab    . aspirin EC 81 MG tablet Take 1 tablet (81 mg total) by mouth daily. 90 tablet 3   No current facility-administered medications for this visit.     Allergies  Allergen Reactions  . Vesicare [Solifenacin Succinate] Itching and Rash  . Statins Other (See Comments)    myalgias  . Promethazine Hcl Other (See Comments)    Agitation and incoherence (phenergan)    Social History   Social History  . Marital status: Married    Spouse name: N/A  . Number of children: 1  . Years of education: N/A   Occupational History  . retired Engineer, site Retired   Social History Main Topics  . Smoking status: Former Smoker    Quit date: 04/09/1969  . Smokeless tobacco: Current User    Types: Chew     Comment: reports that when he smoked it was intermittent  . Alcohol use 0.5 oz/week    1 Standard drinks or equivalent per week     Comment: occasional mainly during holiday  . Drug use: No  . Sexual activity: Yes   Other Topics Concern  . Not on file   Social History Narrative  . No narrative on file    Family History  Problem Relation Age of Onset  . Heart disease Father   . Heart attack Father        40  . Cancer Mother 58       LUNG  . Hypertension Neg Hx   . Stroke Neg Hx     Review of Systems:  As stated in the HPI and otherwise negative.   BP 140/80   Pulse 62   Ht 6\' 7"  (2.007 m)   Wt 267 lb (121.1 kg)   SpO2 98%   BMI 30.08 kg/m   Physical Examination:  General: Well developed, well nourished, NAD  HEENT: OP clear, mucus membranes moist  SKIN: warm, dry. No rashes. Neuro: No focal deficits  Musculoskeletal: Muscle strength 5/5 all ext  Psychiatric: Mood and affect normal  Neck: No JVD, no carotid bruits, no thyromegaly, no lymphadenopathy.  Lungs:Clear bilaterally, no wheezes, rhonci, crackles Cardiovascular: Regular rate and  rhythm. No murmurs, gallops or rubs. Abdomen:Soft. Bowel sounds present. Non-tender.  Extremities: No lower extremity edema. Pulses are 2 + in the bilateral DP/PT.  Cardiac cath 06/08/11: Left  main: There are separate ostia for the LAD and Circumflex.  Left Anterior Descending Artery: Large vessel that courses to the apex. No obstructive disease noted.Small diagonal branch.  Circumflex Artery: Large caliber vessel with no obstructive disease noted.  Right Coronary Artery: Very large, dominant vessel with patent proximal stent. There is mild plaque disease in the mid and distal RCA. The small caliber PDA has 60% stenosis.  Left Ventricular Angiogram: Dilated LV. LVEF=50%.  Aortic root: Dilated aortic root and ascending aorta.  Impression:  1. 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 Preserved LV systolic function  3. 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   Echo 09/01/15: Left ventricle: The cavity size was mildly dilated. Wall  thickness was increased in a pattern of mild LVH. Systolic  function was at the lower limits of normal. The estimated  ejection fraction was in the range of 50% to 55%. Wall motion was  normal; there were no regional wall motion abnormalities.  Features are consistent with a pseudonormal left ventricular  filling pattern, with concomitant abnormal relaxation and  increased filling pressure (grade 2 diastolic dysfunction). - Aortic valve: There was mild to moderate regurgitation directed  centrally in the LVOT, eccentrically in the LVOT, and towards the  mitral anterior leaflet. - Pulmonary arteries: PA peak pressure: 32 mm Hg (S).  EKG:  EKG is  ordered today. The ekg ordered today demonstrates Sinus brady, rate 59 bpm. Incomplete RBBB  Recent Labs: No results found for requested labs within last 8760 hours.   Lipid Panel Followed in primary care   Wt Readings from Last 3 Encounters:  10/19/16 267 lb (121.1 kg)  06/07/16 270 lb  (122.5 kg)  09/02/15 272 lb (123.4 kg)     Other studies Reviewed: Additional studies/ records that were reviewed today include: . Review of the above records demonstrates:    Assessment and Plan:   1. CAD without angina: He has no chest pain suggestive of angina. Last cath in 2013 with patent stent RCA, mild disease in the LAD and Circumflex. Stress test 2017 with no ischemia. Continue ASA and statin. No beta blocker due to bradycardia.    2. Thoracic Aortic aneurysm: Followed in the CT surgery clinic by Dr. Tyrone SageGerhardt. BP is well controlled. Repeat CTA chest in March 2019.   3. HTN: BP well controlled. No changes.   4. HLD: Lipids followed in primary care. LDL 53 in April 2018. Continue statin.   Current medicines are reviewed at length with the patient today.  The patient does not have concerns regarding medicines.  The following changes have been made:  no change  Labs/ tests ordered today include:   Orders Placed This Encounter  Procedures  . EKG 12-Lead    Disposition:   FU with me in 12 months  Signed, Verne Carrowhristopher Lashante Fryberger, MD 10/19/2016 8:56 AM    [facility] [address], [place], [place]  [address] Phone: [phone]; Fax: [phone]

## 2016-10-19 ENCOUNTER — Ambulatory Visit (INDEPENDENT_AMBULATORY_CARE_PROVIDER_SITE_OTHER): Payer: Medicare Other | Admitting: Cardiovascular Disease

## 2016-10-19 ENCOUNTER — Encounter: Payer: Self-pay | Admitting: Cardiovascular Disease

## 2016-10-19 VITALS — BP 140/80 | HR 62 | Ht 79.0 in | Wt 267.0 lb

## 2016-10-19 DIAGNOSIS — I712 Thoracic aortic aneurysm, without rupture, unspecified: Secondary | ICD-10-CM

## 2016-10-19 DIAGNOSIS — E7800 Pure hypercholesterolemia, unspecified: Secondary | ICD-10-CM

## 2016-10-19 DIAGNOSIS — I2510 Atherosclerotic heart disease of native coronary artery without angina pectoris: Secondary | ICD-10-CM | POA: Diagnosis not present

## 2016-10-19 DIAGNOSIS — I10 Essential (primary) hypertension: Secondary | ICD-10-CM | POA: Diagnosis not present

## 2016-10-19 MED ORDER — ASPIRIN EC 81 MG PO TBEC: 81.0000 mg | DELAYED_RELEASE_TABLET | Freq: Every day | ORAL | 3 refills | Status: DC

## 2016-10-19 NOTE — Patient Instructions (Signed)
Medication Instructions:  Your physician has recommended you make the following change in your medication: Decrease aspirin to 81 mg by mouth daily.    Labwork: none  Testing/Procedures: none  Follow-Up: Your physician recommends that you schedule a follow-up appointment in: 12 months. Please call our office in about 9 months to schedule this appointment    Any Other Special Instructions Will Be Listed Below (If Applicable).     If you need a refill on your cardiac medications before your next appointment, please call your pharmacy.   

## 2017-01-16 ENCOUNTER — Other Ambulatory Visit: Payer: Self-pay | Admitting: Internal Medicine

## 2017-01-16 DIAGNOSIS — M545 Low back pain: Secondary | ICD-10-CM

## 2017-02-01 ENCOUNTER — Ambulatory Visit
Admission: RE | Admit: 2017-02-01 | Discharge: 2017-02-01 | Disposition: A | Discharge status: Home / Self Care | Payer: Medicare Other | Source: Ambulatory Visit | Attending: Internal Medicine | Admitting: Internal Medicine

## 2017-02-01 DIAGNOSIS — M545 Low back pain: Secondary | ICD-10-CM

## 2017-02-07 ENCOUNTER — Other Ambulatory Visit: Payer: Self-pay | Admitting: Neurosurgery

## 2017-02-07 DIAGNOSIS — M5126 Other intervertebral disc displacement, lumbar region: Secondary | ICD-10-CM

## 2017-02-18 ENCOUNTER — Ambulatory Visit
Admission: RE | Admit: 2017-02-18 | Discharge: 2017-02-18 | Disposition: A | Discharge status: Home / Self Care | Payer: Medicare Other | Source: Ambulatory Visit | Attending: Neurosurgery | Admitting: Neurosurgery

## 2017-02-18 DIAGNOSIS — M5126 Other intervertebral disc displacement, lumbar region: Secondary | ICD-10-CM

## 2017-02-18 MED ORDER — IOPAMIDOL (ISOVUE-M 200) INJECTION 41%
1.0000 mL | Freq: Once | INTRAMUSCULAR | Status: AC
  Administered 2017-02-18: 1 mL via EPIDURAL

## 2017-02-18 MED ORDER — METHYLPREDNISOLONE ACETATE 40 MG/ML INJ SUSP (RADIOLOG
120.0000 mg | Freq: Once | INTRAMUSCULAR | Status: AC
  Administered 2017-02-18: 120 mg via EPIDURAL

## 2017-02-18 NOTE — Discharge Instructions (Signed)

## 2017-03-27 ENCOUNTER — Other Ambulatory Visit: Payer: Self-pay | Admitting: Neurosurgery

## 2017-03-27 DIAGNOSIS — M5126 Other intervertebral disc displacement, lumbar region: Secondary | ICD-10-CM

## 2017-04-15 ENCOUNTER — Ambulatory Visit
Admission: RE | Admit: 2017-04-15 | Discharge: 2017-04-15 | Disposition: A | Discharge status: Home / Self Care | Payer: Medicare Other | Source: Ambulatory Visit | Attending: Neurosurgery | Admitting: Neurosurgery

## 2017-04-15 DIAGNOSIS — M5126 Other intervertebral disc displacement, lumbar region: Secondary | ICD-10-CM

## 2017-04-15 MED ORDER — IOPAMIDOL (ISOVUE-M 200) INJECTION 41%
1.0000 mL | Freq: Once | INTRAMUSCULAR | Status: AC
  Administered 2017-04-15: 1 mL via EPIDURAL

## 2017-04-15 MED ORDER — METHYLPREDNISOLONE ACETATE 40 MG/ML INJ SUSP (RADIOLOG
120.0000 mg | Freq: Once | INTRAMUSCULAR | Status: AC
  Administered 2017-04-15: 120 mg via EPIDURAL

## 2017-04-15 NOTE — Discharge Instructions (Signed)

## 2017-05-24 ENCOUNTER — Other Ambulatory Visit: Payer: Self-pay | Admitting: Cardiothoracic Surgery

## 2017-05-24 DIAGNOSIS — I712 Thoracic aortic aneurysm, without rupture, unspecified: Secondary | ICD-10-CM

## 2017-07-04 ENCOUNTER — Ambulatory Visit: Payer: Medicare Other | Admitting: Cardiothoracic Surgery

## 2017-07-04 ENCOUNTER — Ambulatory Visit
Admission: RE | Admit: 2017-07-04 | Discharge: 2017-07-04 | Disposition: A | Discharge status: Home / Self Care | Payer: Medicare Other | Source: Ambulatory Visit | Attending: Cardiothoracic Surgery | Admitting: Cardiothoracic Surgery

## 2017-07-04 VITALS — BP 157/84 | HR 52 | Resp 20 | Ht 79.0 in | Wt 274.0 lb

## 2017-07-04 DIAGNOSIS — I712 Thoracic aortic aneurysm, without rupture, unspecified: Secondary | ICD-10-CM

## 2017-07-04 DIAGNOSIS — R918 Other nonspecific abnormal finding of lung field: Secondary | ICD-10-CM | POA: Diagnosis not present

## 2017-07-04 MED ORDER — IOPAMIDOL (ISOVUE-370) INJECTION 76%
75.0000 mL | Freq: Once | INTRAVENOUS | Status: AC | PRN
  Administered 2017-07-04: 75 mL via INTRAVENOUS

## 2017-07-04 NOTE — Progress Notes (Signed)
[address]       [place] [address]             [phone]                                                       ASLAN HIMES [facility] Medical Record #[id number] Date of Birth: 03/29/1941  Referring: Kathleene Hazel* Primary Care: [name], MD  Chief Complaint:    Dilated aorta    History of Present Illness:    Patient is a 77 year old male with known coronary occlusive disease having a stents placed in his right coronary artery 12  years ago. He is also known to have a dilated ascending aorta this is been followed since 2004 with serial CT scans. He returns today for follow up CTA  of the chest. Scans dating back to 2004 showed aorta at 4.5 cm in 2004.  the patient has no history of aortic insufficiency has no signs or symptoms of congestive heart failure.  he currently has no angina. There is no family history of aortic aneurysm or aortic dissections. His father died at age 59 in 66 sudden death while at a picnic attributed to "cardiac spasm". He notes that his joints have become progressively stiffer The patient does chew tobacco, previously smoked cigarettes more than 25 years ago for 1 year.   His primary complaint remains chronic back pain and knee pain making it difficult to be active.   Current Activity/ Functional Status: Patient is independent with mobility/ambulation, transfers, ADL's, IADL's.   Past Medical History  Diagnosis Date  . Hypertension   . Hypercholesteremia     managed by dr Waynard Edwards  . 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   . Thoracic aortic aneurysm     last scan in Jan 2013. Now measuring 5.0cm. Referred to Dr. Tyrone Sage  . Malaria  patient had 3 episodes of malaria while serving in [place]   . SVT (supraventricular tachycardia)     remote episode during exercise test  . Lung nodules     noted on past CT  scans    Past Surgical History:  Procedure Laterality Date  . CERVICAL FUSION  03-2010   c2-c7  . COLONOSCOPY     x4  . CORONARY STENT PLACEMENT  2004  . dental implant     x 2  . ELBOW SURGERY  1997   lt and rt  . EYE SURGERY    . HIP PINNING,CANNULATED Right 05/11/2014   Procedure: CANNULATED HIP PINNING;  Surgeon: Sheral Apley, MD;  Location: [facility] OR;  Service: Orthopedics;  Laterality: Right;  . RETINAL DETACHMENT SURGERY  1975   lt  . rotator cuff surgery  1997   lt  . SHOULDER ARTHROSCOPY  2/15   right-got cardiac clearance-gsc  . TONSILLECTOMY    . TRIGGER FINGER RELEASE Left 10/12/2013   Procedure: LEFT LONG FINGER TRIGGER RELEASE;  Surgeon: Jodi Marble, MD;  Location: [facility];  Service: Orthopedics;  Laterality: Left;    Family History  Problem Relation Age of Onset  . Heart disease Father   . Heart attack Father  45  . Cancer Mother 21       LUNG  . Hypertension Neg Hx   . Stroke Neg Hx     History   Social History  . Marital Status: Married    Spouse Name: N/A    Number of Children: 1  . Years of Education: N/A   Occupational History  . 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 Smoking status: Former Smoker    Quit date: 04/09/1969  . Smokeless tobacco: Former Neurosurgeon    Types: Chew    Quit date: 03/09/2010  . Alcohol Use: 0.5 oz/week    1 drink(s) per week     occasional  . Drug Use: No  . Sexually Active: Yes    Social History Narrative       Social History   Tobacco Use  Smoking Status Former Smoker  . Last attempt to quit: 04/09/1969  . Years since quitting: 48.2  Smokeless Tobacco Current User  . Types: Chew  Tobacco Comment   reports that when he smoked it was intermittent    Social History   Substance and Sexual Activity  Alcohol Use Yes  . Alcohol/week: 0.5 oz  . Types: 1 Standard drinks or equivalent per week   Comment: occasional  mainly during holiday     Allergies  Allergen Reactions  . Promethazine Hcl Other (See Comments)    Agitation and incoherence (phenergan)  . Statins Other (See Comments)    myalgias  . Vesicare [Solifenacin Succinate] Itching and Rash    Current Outpatient Medications  Medication Sig Dispense Refill  . aspirin EC 81 MG tablet Take 1 tablet (81 mg total) by mouth daily. (Patient taking differently: Take 81 mg by mouth daily. Takes 1/2 tab of 325 mg per day) 90 tablet 3  . Coenzyme Q10 (COQ-10) 200 MG CAPS Take 200 mg by mouth daily after supper.     . colesevelam (WELCHOL) 625 MG tablet Take 3,750 mg by mouth daily after supper. 6 tablets    . ezetimibe (ZETIA) 10 MG tablet Take 5 mg by mouth daily. Takes half tab of 10mg     . fluticasone (FLONASE) 50 MCG/ACT nasal spray Place 1 spray into the nose 2 (two) times daily as needed (seasonal allergies/ drainage).     . Glucosamine-Chondroit-Vit C-Mn (GLUCOSAMINE CHONDR 1500 COMPLX) CAPS Take 2 capsules by mouth daily after supper.     . hydrocortisone 2.5 % cream Apply 1 application topically as needed (RASH). Use on head    . lisinopril (PRINIVIL,ZESTRIL) 40 MG tablet Take 40 mg by mouth daily after supper.     . metroNIDAZOLE (METROGEL) 0.75 % gel Apply 1 application topically 2 (two) times daily.     . Multiple Vitamin (MULTIVITAMIN WITH MINERALS) TABS tablet Take 1 tablet by mouth daily after supper.    . nitroGLYCERIN (NITROSTAT) 0.4 MG SL tablet Place 1 tablet (0.4 mg total) under the tongue every 5 (five) minutes as needed for chest pain. 25 tablet 11  . omeprazole (PRILOSEC) 20 MG capsule Take 20 mg by mouth daily after supper.     . Pitavastatin Calcium 4 MG TABS Take 2 mg by mouth daily after supper. Takes half of 4mg  tab     No current facility-administered medications for this visit.        Review of Systems:     Cardiac Review of Systems: Y or N  Chest  Pain [  n  ]  Resting SOB [ n  ] Exertional SOB  [ n ]  Orthopnea [n   ]   Pedal Edema [  mild ]    Palpitations [ n ] Syncope  [ n ]   Presyncope [ n  ]  General Review of Systems: [Y] = yes [  ]=no Constitional: recent weight change [  ]; anorexia [  ]; fatigue [  ]; nausea [  ]; night sweats [  ]; fever [  ]; or chills [  ];                                                                                                                                          Dental: poor dentition[ y caps]; Last Dentist visit: last 6 months  Eye : blurred vision [  ]; diplopia [   ]; vision changes [  ];  Amaurosis fugax[  ]; Resp: cough [  ];  wheezing[  ];  hemoptysis[  ]; shortness of breath[  ]; paroxysmal nocturnal dyspnea[  ]; dyspnea on exertion[  ]; or orthopnea[  ];  GI:  gallstones[  ], vomiting[  ];  dysphagia[  ]; melena[  ];  hematochezia [  ]; heartburn[  ];   Hx of  Colonoscopy[y  ]; GU: kidney stones [  ]; hematuria[  ];   dysuria [ y ];  nocturia[  y];  history of     obstruction [ n ];             Skin: rash, swelling[  ];, hair loss[  ];  peripheral edema[  ];  or itching[  ]; Musculosketetal: myalgias[  ];  joint swelling[  ];  joint erythema[  ];  joint pain[  ];  back pain[  ];  Heme/Lymph: bruising[  ];  bleeding[  ];  anemia[  ];  Neuro: TIA[  ];  headaches[  ];  stroke[  ];  vertigo[  ];  seizures[  ];   paresthesias[  ];  difficulty walking[ yes, chronic back pain and bilaterial knee pain ];  Psych:depression[  ]; anxiety[  ];  Endocrine: diabetes[ n ];  thyroid dysfunction[ n ];  Immunizations: Flu [ y ]; Pneumococcal[y  ]; and shingles  Other:  Physical Exam: BP (!) 157/84   Pulse (!) 52   Resp 20   Ht 6\' 7"  (2.007 m)   Wt 274 lb (124.3 kg)   SpO2 98% Comment: RA  BMI 30.87 kg/m   General appearance: alert, cooperative and no distress Head: Normocephalic, without obvious abnormality, atraumatic Neck: no adenopathy, no carotid bruit, no JVD, supple, symmetrical, trachea midline and thyroid not enlarged, symmetric, no  tenderness/mass/nodules Lymph nodes: Cervical, supraclavicular, and axillary nodes normal. Resp: clear to auscultation bilaterally Back: symmetric, no curvature. ROM normal. No CVA tenderness. Cardio: regular rate and rhythm, S1, S2  normal, no murmur, click, rub or gallop GI: soft, non-tender; bowel sounds normal; no masses,  no organomegaly Extremities: extremities normal, atraumatic, no cyanosis or edema Neurologic: Grossly normal  Patient has large body stature with a body surface area of 2.6 however he has no features of Marfan's,  Diagnostic Studies & Laboratory data:     Recent Radiology Findings: Ct Angio Chest Aorta W/cm &/or Wo/cm  Result Date: 07/04/2017 CLINICAL DATA:  Thoracic aortic aneurysm follow-up. EXAM: CT ANGIOGRAPHY CHEST WITH CONTRAST TECHNIQUE: Multidetector CT imaging of the chest was performed using the standard protocol during bolus administration of intravenous contrast. Multiplanar CT image reconstructions and MIPs were obtained to evaluate the vascular anatomy. CONTRAST:  75mL ISOVUE-370 IOPAMIDOL (ISOVUE-370) INJECTION 76% Creatinine was obtained on site at [facility] at [address] Results: Creatinine 1.0 mg/dL. COMPARISON:  CT chest dated June 07, 2016. FINDINGS: Cardiovascular: The heart size is normal. No pericardial effusion. Stable dilatation of the ascending thoracic aorta, measuring up to 4.8 cm. There is also aneurysmal dilatation of the proximal descending thoracic aorta, measuring up to 4.1 cm, also unchanged. No aortic dissection. No pulmonary embolism. Coronary artery atherosclerosis. Mild reflux of contrast into the intrahepatic veins. Mediastinum/Nodes: No enlarged mediastinal, hilar, or axillary lymph nodes. Thyroid gland, trachea, and esophagus demonstrate no significant findings. Lungs/Pleura: New 5 mm pulmonary nodule in the lingula (series 7, image 111). Other scattered tiny pulmonary nodules in both lungs are unchanged since 2011 and  benign. No focal consolidation, pleural effusion, or pneumothorax. Upper Abdomen: No acute abnormality. Musculoskeletal: No chest wall abnormality. No acute or significant osseous findings. Review of the MIP images confirms the above findings. IMPRESSION: 1. Stable ascending thoracic aortic aneurysm, measuring 4.8 cm. Recommend semi-annual imaging followup by CTA or MRA and referral to cardiothoracic surgery if not already obtained. This recommendation follows 2010 ACCF/AHA/AATS/ACR/ASA/SCA/SCAI/SIR/STS/SVM Guidelines for the Diagnosis and Management of Patients With Thoracic Aortic Disease. Circulation. 2010; 121: F621-H086 2. Unchanged aneurysmal dilatation of the proximal descending thoracic aorta, measuring 4.1 cm. 3. New 5 mm pulmonary nodule in the lingula. No follow-up needed if patient is low-risk. Non-contrast chest CT can be considered in 12 months if patient is high-risk. This recommendation follows the consensus statement: Guidelines for Management of Incidental Pulmonary Nodules Detected on CT Images: From the Fleischner Society 2017; Radiology 2017; 284:228-243. 4. Reflux of contrast into the intrahepatic veins may reflect a degree of right heart dysfunction. Electronically Signed   By: Obie Dredge M.D.   On: 07/04/2017 10:08   Ct Chest W Contrast  Result Date: 06/07/2016 CLINICAL DATA:  Lung nodules.  Known thoracic aneurysm. EXAM: CT CHEST WITH CONTRAST TECHNIQUE: Multidetector CT imaging of the chest was performed during intravenous contrast administration. CONTRAST:  75mL ISOVUE-300 IOPAMIDOL (ISOVUE-300) INJECTION 61% COMPARISON:  05/26/2015.  12/02/2014. FINDINGS: Cardiovascular: Heart size upper normal. Coronary artery calcification is noted. Atherosclerotic calcification is noted in the wall of the thoracic aorta. Ascending thoracic aorta measures 4.7 cm diameter. Mediastinum/Nodes: No mediastinal lymphadenopathy. No evidence for gross hilar lymphadenopathy although assessment is limited  by the lack of intravenous contrast on today's study. The esophagus has normal imaging features. There is no axillary lymphadenopathy. Lungs/Pleura: Multiple tiny bilateral pulmonary nodules are again identified. Imaging today is degraded by breathing motion which obscures fine architectural detail in the lungs. Within this limitation, no change in the numerous bilateral scattered pulmonary nodules is apparent. Previous study compare these nodules back to an exam from 05/09/2009 and demonstrates stability, consistent with benign  process. No new or progressive pulmonary nodule on today's study. No focal airspace consolidation. No pulmonary edema or pleural effusion. Upper Abdomen: Unremarkable. Musculoskeletal: Sclerotic lesion left L2 transverse process stable back to a study from 02/24/2015. IMPRESSION: 1. Continued interval stability of numerous tiny bilateral pulmonary nodules. 2. Stable aneurysm of the ascending thoracic aorta, measuring 4.7 cm today compared to 4.8 cm previously. Recommend semi-annual imaging followup by CTA or MRA and referral to cardiothoracic surgery if not already obtained. This recommendation follows 2010 ACCF/AHA/AATS/ACR/ASA/SCA/SCAI/SIR/STS/SVM Guidelines for the Diagnosis and Management of Patients With Thoracic Aortic Disease. Circulation. 2010; 121: Q657-Q469. 3. Coronary artery atherosclerosis. Electronically Signed   By: Kennith Center M.D.   On: 06/07/2016 16:00   Ct Angio Chest Aorta W/cm &/or Wo/cm  12/02/2014   CLINICAL DATA:  Follow-up lung nodule and thoracic aortic aneurysm, ex-smoker.  EXAM: CT ANGIOGRAPHY CHEST WITH CONTRAST  TECHNIQUE: Multidetector CT imaging of the chest was performed using the standard protocol during bolus administration of intravenous contrast. Multiplanar CT image reconstructions and MIPs were obtained to evaluate the vascular anatomy.  CONTRAST:  75 cc of Isovue 370  COMPARISON:  Chest CTs dated 08/06/2014, 12/03/2013, and 11/13/2012.  FINDINGS:  The benign 4 mm pulmonary nodule within the left lateral lingula is stable. The 5 mm pulmonary nodule within the medial aspects of the superior segment of the right lower lobe is stable compared to the chest CT of 08/06/2014 and is also stable compared to the earlier study of 11/13/2012 indicating benignity.  There is a new 6 mm pulmonary nodule within the lateral aspects of the right lower lobe and there is a new 5 mm pulmonary nodule within the medial aspects of the right lower lobe (series 5, images 41 and 40 respectively). There is an additional questionable pulmonary nodule within the right lower lobe posteriorly measuring 7 mm (series 5, image 38). No other new lung findings seen.  The dilatation of the ascending thoracic aorta is unchanged at 4.6 cm diameter. Distal thoracic aortic arch is also again mildly prominent measuring 3.7 cm diameter, unchanged. No aortic dissection.  Heart size is upper normal, unchanged. Coronary artery calcifications noted within the left anterior descending and left circumflex coronary arteries. No mass or enlarged lymph nodes seen within the mediastinum or perihilar regions. Supraclavicular regions are unremarkable. Limited images of the upper abdomen are unremarkable. Degenerative changes within the thoracic spine are unchanged. No acute osseous abnormality.  Review of the MIP images confirms the above findings.  IMPRESSION: 1. New pulmonary nodules within the right lower lobe, as detailed above. These include a new 6 mm pulmonary nodule within the lateral aspects of the right lower lobe and a new 5 mm pulmonary nodule within the medial aspects of the right lower lobe. There is also a questionable pulmonary nodule within the right lower lobe posteriorly measuring 7 mm greatest dimension. As the patient is at high risk for bronchogenic carcinoma, follow-up chest CT at 3-94months is recommended. This recommendation follows the consensus statement: Guidelines for Management of  Small Pulmonary Nodules Detected on CT Scans: A Statement from the Fleischner Society as published in Radiology 2005; 237:395-400. 2. Remainder of the pulmonary nodules have been stable for at least 2 years indicating benignity. 3. Stable ascending thoracic aortic aneurysm measuring 4.6 cm diameter. 4. Coronary artery calcifications.   Electronically Signed   By: Bary Richard M.D.   On: 12/02/2014 14:26  I have independently reviewed the above radiology studies  and reviewed the findings with  the patient.   12/03/2013   CLINICAL DATA:  Thoracic aortic aneurysm.  EXAM: CT ANGIOGRAPHY CHEST WITH CONTRAST  TECHNIQUE: Multidetector CT imaging of the chest was performed using the standard protocol during bolus administration of intravenous contrast. Multiplanar CT image reconstructions and MIPs were obtained to evaluate the vascular anatomy.  CONTRAST:  80mL OMNIPAQUE IOHEXOL 350 MG/ML SOLN  COMPARISON:  CT scan of November 13, 2012 and December 26, 2011.  FINDINGS: No pneumothorax or pleural effusion is noted. Stable 4.5 mm nodule is noted in left lingula laterally which is unchanged compared with 2013 and can be considered benign at this point. 4.8 cm nodule is noted in the medial portion of the superior segment of the right lower lobe best seen on image number 26 of series 5. This is unchanged compared to prior exam. No acute pulmonary disease is noted. Coronary artery calcifications are again noted. No significant mediastinal mass or adenopathy is noted. Ascending thoracic aorta has maximum measured diameter 4.6 cm which is not changed compared to prior exam. No dissection or significant atheromatous disease is noted. Normal caliber of descending thoracic aorta is noted. Visualized portion of upper abdomen appears normal. The great vessels appear to be widely patent without significant stenosis. No significant osseous abnormality is noted.  Review of the MIP images confirms the above findings.  IMPRESSION: 4.6 cm  ascending thoracic aortic aneurysm is noted which is unchanged compared to prior exam.  Coronary artery calcifications are noted consistent with coronary artery disease.  Stable stable 4.8 cm nodule seen in superior segment of right lower lobe ; followup chest CT in 12 months is recommended to ensure stability.   Electronically Signed   By: Roque Lias M.D.   On: 12/03/2013 14:47   I have discussed with radiology and reviewed the films, should be 4.8 mm not cm nodule  Ct Angio Chest Aorta W/cm &/or Wo/cm  11/13/2012   *RADIOLOGY REPORT*  Clinical Data: Aortic aneurysm  CT ANGIOGRAPHY CHEST  Technique:  Multidetector CT imaging of the chest using the standard protocol during bolus administration of intravenous contrast. Multiplanar reconstructed images including MIPs were obtained and reviewed to evaluate the vascular anatomy.  Contrast: OMNIPAQUE IOHEXOL 350 MG/ML SOLN  Comparison: 12/26/2011  Findings: Aortic diameter at the sinus of Valsalva, sinotubular junction, and ascending aorta are 5.0 cm, 3.7 cm, and 4.6 cm respectively.  No evidence of aortic dissection or transection. Little if any plaque within the thoracic aorta.  Innominate artery, right vertebral artery, right subclavian artery, right common carotid artery, left common carotid artery, left subclavian artery, and left vertebral artery are all widely patent within the confines of the examination.  Right coronary artery stent is noted.  Calcification in the circumflex and left anterior descending coronary vessels are noted. Distal right coronary artery calcifications are noted.  No obvious filling defects in the pulmonary arterial tree to suggest acute pulmonary thromboembolism.  Negative abnormal mediastinal adenopathy.  No pericardial effusion.  Sub centimeter right thyroid hypodensity on image 17 of series 4.  No pneumothorax.  No pleural effusion.  Calcified lymph nodes in the mediastinum or are noted.  Sub centimeter pulmonary nodules are  stable.  Cervical spine fusion hardware is in place.  The right-sided screw within the C7 vertebral body has retracted and is within the prevertebral soft tissues.  Images of the upper abdomen demonstrate diffuse hepatic steatosis. Prominence of the adrenal glands without focal mass is stable.  IMPRESSION: Maximal diameter of the ascending aorta  is 4.6 cm.  Previously, it was measured at 4.9 cm.  It is therefore not enlarging.  No evidence of aortic dissection.  Sub centimeter right thyroid hypodensity is stable in retrospect compared with a study from 2012.  This supports benign etiology.  Cervical spine fusion hardware is in place.  One of the screws in C7 has retracted and is residing within the prevertebral soft tissues.   Original Report Authenticated By: Jolaine Click, M.D.    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  Follow-up study.  CT ANGIOGRAPHY OF THE HEART, CORONARY ARTERY, STRUCTURE, AND MORPHOLOGY  CONTRAST:  100 ml of Omnipaque-300.  COMPARISON:  CTA chest 05/14/2011.  TECHNIQUE:  CT angiography of the coronary vessels was performed on a 256 channel system using prospective ECG gating.  A scout and noncontrast exam (for calcium scoring) were performed.  Circulation time was measured using a test bolus.  Coronary CTA was performed with sub mm slice collimation during portions of the cardiac cycle after prior injection of iodinated contrast.  Imaging post processing was performed on an independent workstation creating multiplanar and 3-D images, and quantitative analysis of the heart and coronary arteries.  Note that this exam targets the heart and the chest was not imaged in its entirety.  PREMEDICATION: Lopressor 5 mg, IV Nitroglycerin 400 mcg, sublingual.  FINDINGS: Technical quality:  Good  Heart rate:  55-60  CORONARY ARTERIES: Left main coronary artery:   Not present (there are separate ostia for the left anterior descending and left circumflex coronary arteries directly off the left sinus of Valsalva). Left anterior descending:  Mildly diseased with mixed calcified noncalcified atherosclerotic plaque with areas of stenosis up to 25 - 50% in the mid LAD. Left circumflex:  Large caliber vessel that quickly gives rise to two large obtuse marginal vessels.  Proximal left circumflex demonstrates only minimal calcified plaque but no associated stenosis. Obtuse Marginal 1: Mildly diseased with calcified plaque with no associated luminal stenosis. Obtuse Marginal 2: Extensive calcified atherosclerotic plaque proximally with stenosis >50% (possibly >75%), however, secondary to the high degree of calcification, accurate assessment for luminal stenosis is not possible on this examination. Right coronary artery:  There is a stent in the proximal right coronary artery which is grossly patent.  There is some calcified plaque at the distal end of the stent where the vessel is slightly acutely angulated.  There appears to be some stenosis in this region, however, it is favored to be approximately 25 - 50% (accurate assessment is limited by the high-density the stent material and the heavily calcified plaque).  The remainder of the RCA is otherwise mildly diseased with nonobstructive plaque. Posterior descending artery:  Patent with a mixed calcified noncalcified plaque.  Secondary to the small size of the vessel and excessive image noise, accurate assessment for stenosis is not possible on this examination, however, there appears to be at least 25 - 50% stenosis of the mid PDA. Dominance:  Right  CORONARY CALCIUM: Total Agatston Score:  655 MESA database percentile:  80th Comment - it appears as though the patient has moved or breathed during image acquisition, such that the a portion of the coronary arteries was incompletely visualized.  This likely underestimates the patient's  true calcium score.  AORTA AND PULMONARY MEASUREMENTS: Aortic root (21 - 40 mm):  29 mm  at the annulus             49 mm  at the sinuses of Valsalva         36 mm  at the sinotubular junction Ascending aorta ( <  40 mm):  50 mm Aortic arch : 34 mm Descending aorta:  28 mm Main pulmonary artery:  ( <  30 mm):  35 mm  EXTRACARDIAC FINDINGS: Several tiny pulmonary nodules measuring 4 mm are scattered throughout the lungs bilaterally and are completely unchanged compared to prior examinations (dating back to 2007); these can be considered radiographically benign requiring no further imaging follow-up.  Small calcified granulomas are also noted in the posterior aspect of the left upper lobe.  Multiple calcified left hilar and mediastinal lymph nodes are noted.  No acute consolidative airspace disease.  No pleural effusions.  Visualized portions of the upper abdomen are unremarkable.  IMPRESSION:  1. Ascending thoracic aortic aneurysm is similar in size measuring 4.9 cm on today's examination.  No evidence of thoracic aortic dissection at this time. 2.  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  The patient's aortic valve appears to be tricuspid.  Additionally, there is no effacement of the sinotubular junction. 3.  Multivessel coronary artery disease, as detailed above.  Most importantly, there is a heavily calcified plaque in the obtuse marginal 2 branch (a large vessel) which appears to be stenotic (i.e., at least 50% diameter stenosis, and possbily in excess of 75%), however, accurate assessment for percentage stenosis is limited on this examination secondary to the high degree of calcification. Clinical correlation is recommended, with consideration for further evaluation with stress testing if there is clinical concern for inducible ischemia. 4.  Coronary artery calcium scoring is at least 655 (the patient moved during the image acquisition,  resulting in incomplete coverage of the coronary arteries which likely underestimates the true calcium score), which is 80th percentile for patient's of matched age, gender and race/ethnicity. 5.  Right coronary artery dominance.   Original Report Authenticated By: Florencia Reasons, M.D.     Recent Lab Findings: Lab Results  Component Value Date   WBC 7.7 08/02/2015   HGB 15.2 08/02/2015   HCT 44.3 08/02/2015   PLT 214 08/02/2015   GLUCOSE 87 08/02/2015   CHOL 184 11/14/2010   TRIG 143.0 11/14/2010   HDL 48.60 11/14/2010   LDLCALC 107 (H) 11/14/2010   ALT 16 08/06/2014   AST 22 08/06/2014   NA 142 08/02/2015   K 4.1 08/02/2015   CL 105 08/02/2015   CREATININE 0.92 08/02/2015   BUN 15 08/02/2015   CO2 20 08/02/2015   INR 1.0 06/06/2011    Procedure Performed:  1. Left Heart Catheterization 2. Selective Coronary Angiography 3. Left ventricular angiogram 4. Aortic root angiogram Operator: Verne Carrow, MD  Indication: Pt with known CAD and thoracic aortic aneurysm. The aortic aneurysm has enlarged over the last year and we recently referred Mr. Vizcarrondo to see Dr. Tyrone Sage. Plans are being made for aortic root replacement. Cardiac cath today to exclude progression of CAD.  Procedure Details:  The risks, benefits, complications, treatment options, and expected outcomes were discussed with the patient. The patient and/or family concurred with the proposed plan, giving informed consent. The patient was brought to the cath lab after IV hydration was begun and oral premedication was given. The patient was further sedated with Versed and Fentanyl. The right  groin was prepped and draped in the usual manner. Using the modified Seldinger access technique, a 4 French sheath was placed in the right femoral artery. A JL-5 catheter was used to engage the LAD with sub-selective shots of the Circumflex. An AL-1 catheter was used to non-selectively inject the Circumflex artery. A 3DRC  catheter was used to engage and inject the RCA. A pigtail catheter was used to perform a left ventricular angiogram. The catheter was pulled back across the aortic valve. An aortic root angiogram was performed.  There were no immediate complications. The patient was taken to the recovery area in stable condition.  Hemodynamic Findings:  Central aortic pressure: 130/72  Left ventricular pressure: 130/17/24  Angiographic Findings:  Left main: There are separate ostia for the LAD and Circumflex.  Left Anterior Descending Artery: Large vessel that courses to the apex. No obstructive disease noted.Small diagonal branch.  Circumflex Artery: Large caliber vessel with no obstructive disease noted.  Right Coronary Artery: Very large, dominant vessel with patent proximal stent. There is mild plaque disease in the mid and distal RCA. The small caliber PDA has 60% stenosis.  Left Ventricular Angiogram: Dilated LV. LVEF=50%.  Aortic root: Dilated aortic root and ascending aorta.  Impression:  1. 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 Preserved LV systolic function  3. 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     08/2015:   Result status: Final result                           [facility]                        [address]                        [place], [place] [address]                            [phone]  ------------------------------------------------------------------- Transthoracic Echocardiography  Patient:    Eudell, Mcphee MR #:       [id number] Study Date: 09/01/2015 Gender:     M Age:        74 Height:     200.7 cm Weight:     124.7 kg BSA:        2.66 m^2 Pt. Status: Room:   Doreene Adas, Scott T  REFERRING    Tereso Newcomer T  SONOGRAPHER  Dewitt Hoes, RDCS  ATTENDING    Thurmon Fair, MD  PERFORMING   [facility] Outpatient  cc:  ------------------------------------------------------------------- LV EF: 50% -    55%  ------------------------------------------------------------------- Indications:      Chest pain (R07.89).  ------------------------------------------------------------------- History:   PMH:   Coronary artery disease.  Risk factors:  Thoracic aortic aneurysm. Current tobacco use.  ------------------------------------------------------------------- Study Conclusions  - Left ventricle: The cavity size was mildly dilated. Wall   thickness was increased in a pattern of mild LVH. Systolic   function was at the lower limits of normal. The estimated   ejection fraction was in the range of 50% to 55%. Wall motion was   normal; there were no regional wall motion abnormalities.   Features are consistent with a pseudonormal left ventricular   filling pattern, with concomitant abnormal relaxation and   increased filling pressure (grade 2 diastolic dysfunction). - Aortic  valve: There was mild to moderate regurgitation directed   centrally in the LVOT, eccentrically in the LVOT, and towards the   mitral anterior leaflet. - Pulmonary arteries: PA peak pressure: 32 mm Hg (S).  ------------------------------------------------------------------- Labs, prior tests, procedures, and surgery: Transthoracic echocardiography (01/25/2015).     EF was 60%. Aortic root: 53mm. Ascending aorta 47mm.  Transthoracic echocardiography.  M-mode, complete 2D, spectral Doppler, and color Doppler.  Birthdate:  Patient birthdate: [birth year].  Age:  Patient is 77 yr old.  Sex:  Gender: male. BMI: 31 kg/m^2.  Blood pressure:     120/80  Patient status: Outpatient.  Study date:  Study date: 09/01/2015. Study time: 07:19 AM.  Location:  [facility]  -------------------------------------------------------------------  ------------------------------------------------------------------- Left ventricle:  The cavity size was mildly dilated. Wall thickness was increased in a pattern of mild LVH.  Systolic function was at the lower limits of normal. The estimated ejection fraction was in the range of 50% to 55%. Wall motion was normal; there were no regional wall motion abnormalities. Features are consistent with a pseudonormal left ventricular filling pattern, with concomitant abnormal relaxation and increased filling pressure (grade 2 diastolic dysfunction).  ------------------------------------------------------------------- Aortic valve:   Trileaflet.  Doppler:  There was mild to moderate regurgitation directed centrally in the LVOT, eccentrically in the LVOT, and towards the mitral anterior leaflet.  ------------------------------------------------------------------- Aorta:  Aortic root: The aortic root was moderately dilated. Ascending aorta: The ascending aorta was moderately dilated.  ------------------------------------------------------------------- Mitral valve:   Structurally normal valve.   Leaflet separation was normal.  Doppler:  Transvalvular velocity was within the normal range. There was no evidence for stenosis. There was trivial regurgitation.    Peak gradient (D): 2 mm Hg.  ------------------------------------------------------------------- Left atrium:  The atrium was at the upper limits of normal in size.   ------------------------------------------------------------------- Right ventricle:  The cavity size was normal. Systolic function was normal.  ------------------------------------------------------------------- Pulmonic valve:    The valve appears to be grossly normal. Doppler:  There was no significant regurgitation.  ------------------------------------------------------------------- Tricuspid valve:   Structurally normal valve.   Leaflet separation was normal.  Doppler:  Transvalvular velocity was within the normal range. There was no regurgitation.  ------------------------------------------------------------------- Pulmonary  artery:   Systolic pressure was at the upper limits of normal.  ------------------------------------------------------------------- Right atrium:  The atrium was at the upper limits of normal in size.  ------------------------------------------------------------------- Pericardium:  There was no pericardial effusion.  ------------------------------------------------------------------- Measurements   Left ventricle                           Value        Reference  LV ID, ED, PLAX chordal          (H)     56.74 mm     43 - 52  LV ID, ES, PLAX chordal          (H)     43.02 mm     23 - 38  LV fx shortening, PLAX chordal   (L)     24    %      >=29  LV PW thickness, ED                      12.2  mm     ---------  IVS/LV PW ratio, ED  1.01         <=1.3  Stroke volume, 2D                        118   ml     ---------  Stroke volume/bsa, 2D                    44    ml/m^2 ---------  LV ejection fraction, 1-p A4C            53    %      ---------  LV end-diastolic volume, 2-p             125   ml     ---------  LV end-systolic volume, 2-p              64    ml     ---------  LV ejection fraction, 2-p                49    %      ---------  Stroke volume, 2-p                       61    ml     ---------  LV end-diastolic volume/bsa, 2-p         47    ml/m^2 ---------  LV end-systolic volume/bsa, 2-p          24    ml/m^2 ---------  Stroke volume/bsa, 2-p                   23    ml/m^2 ---------  LV e&', lateral                           6.83  cm/s   ---------  LV E/e&', lateral                         10.94        ---------  LV e&', medial                            5.48  cm/s   ---------  LV E/e&', medial                          13.63        ---------  LV e&', average                           6.16  cm/s   ---------  LV E/e&', average                         12.14        ---------    Ventricular septum                       Value        Reference  IVS thickness,  ED                        12.3  mm     ---------    LVOT  Value        Reference  LVOT ID, S                               27    mm     ---------  LVOT area                                5.73  cm^2   ---------  LVOT peak velocity, S                    98.3  cm/s   ---------  LVOT mean velocity, S                    57.9  cm/s   ---------  LVOT VTI, S                              20.6  cm     ---------    Aorta                                    Value        Reference  Aortic root ID, ED                       54    mm     ---------  Aortic root ID, M-L, ED                  50.68 mm     ---------  Aortic root ID, STJ, ED                  38.8  mm     ---------  Ascending aorta ID, A-P, S               50    mm     ---------    Left atrium                              Value        Reference  LA ID, A-P, ES                           38    mm     ---------  LA ID/bsa, A-P                           1.43  cm/m^2 <=2.2  LA volume, S                             53.6  ml     ---------  LA volume/bsa, S                         20.1  ml/m^2 ---------  LA volume, ES, 1-p A4C                   54    ml     ---------  LA volume/bsa, ES, 1-p A4C  20.3  ml/m^2 ---------  LA volume, ES, 1-p A2C                   52.9  ml     ---------  LA volume/bsa, ES, 1-p A2C               19.9  ml/m^2 ---------    Mitral valve                             Value        Reference  Mitral E-wave peak velocity              74.7  cm/s   ---------  Mitral A-wave peak velocity              63.1  cm/s   ---------  Mitral deceleration time         (H)     237   ms     150 - 230  Mitral peak gradient, D                  2     mm Hg  ---------  Mitral E/A ratio, peak                   1.2          ---------    Pulmonary arteries                       Value        Reference  PA pressure, S, DP               (H)     32    mm Hg  <=30    Tricuspid valve                           Value        Reference  Tricuspid regurg peak velocity           247   cm/s   ---------  Tricuspid peak RV-RA gradient            24    mm Hg  ---------    Systemic veins                           Value        Reference  Estimated CVP                            8     mm Hg  ---------    Right ventricle                          Value        Reference  RV pressure, S, DP               (H)     32    mm Hg  <=30  RV s&', lateral, S                        10.1  cm/s   ---------  Legend: (L)  and  (H)  mark values outside specified reference range.  ------------------------------------------------------------------- Prepared and Electronically  Authenticated by  Thurmon Fair, MD 2017-05-25T08:56:22     Aortic Size Index=     4.8   /Body surface area is 2.63 meters squared. = 1.8  < 2.75 cm/m2      4% risk per year 2.75 to 4.25          8% risk per year > 4.25 cm/m2    20% risk per year  cross sectional area of aorta cm2/height in meters > 10 consider  surgery 16.6/2= 8.3    Assessment / Plan:  1/Known dilated ascending aorta stable at 4.8 cm -we will obtain follow-up CTA of the chest in 1 year 2/New 5 mm pulmonary nodule in the lingula. No follow-up needed if patient is low-risk. Non-contrast chest CT can be considered in 12 months if patient is high-risk.  Risks  And symptoms of aortic dissection were again reviewed with the patient.  He was cautioned about use of Cipro.  He was also cautioned about straining or heavy lifting.   Patient was warned about not using Cipro and similar antibiotics. Recent studies have raised concern that fluoroquinolone antibiotics could be associated with an increased risk of aortic aneurysm Fluoroquinolones have non-antimicrobial properties that might jeopardise the integrity of the extracellular matrix of the vascular wall In a  propensity score matched cohort study in [place], there was a 66% increased rate of aortic aneurysm or dissection  associated with oral fluoroquinolone use, compared with amoxicillin use, within a 60 day risk period from start of treatment  Delight Ovens MD  Beeper [phone] Office [phone] 07/04/2017 10:20 AM

## 2017-07-04 NOTE — Patient Instructions (Signed)

## 2017-11-14 NOTE — Progress Notes (Signed)
Chief Complaint  Patient presents with  . Follow-up    CAD   History of Present Illness: 77 yo male with h/o CAD s/p stent RCA 2004, HTN, HLD, thoracic aortic aneurysm here today for cardiac follow up. He has a thoracic aortic aneurysm and is followed yearly by Dr. Tyrone Sage with yearly chest CT scans. Last Chest CTA March 2019 with stable dilated ascending aorta. He is known to have CAD and had a stent placed in the RCA in 2004. Last cardiac cath in 2013 with mild disease in the LAD and Circumflex, patent stent proximal RCA and moderate disease in the small PDA. He has not tolerated statins due to muscle aches or beta blockers secondary to bradycardia. Nuclear stress test 08/16/15 with no ischemia. Echo May 2017 with normal LV systolic function, mild to moderate AI.   He is here today for follow up. The patient denies any chest pain, dyspnea, palpitations, lower extremity edema, orthopnea, PND, dizziness, near syncope or syncope.   Primary Care Physician: [name], MD  Past Medical History:  Diagnosis Date  . Arthritis   . Coronary artery disease    Stent 2004  . GERD (gastroesophageal reflux disease)   . History of echocardiogram    Echo 5/17: mild LVH, EF 50-55%, no RWMA, Gr 2 DD, mild to mod AI, PASP 32 mmHg  . History of nuclear stress test    Myoview 5/17 - EF 48%, no scar, no ischemia. Low Risk  . History of pneumonia    77 years old  . Hx of Bell's palsy    left face  . Hypercholesteremia    managed by dr Waynard Edwards  . Hypertension   . 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   . Lung nodules    noted on past CT scans  . Malaria   . SVT (supraventricular tachycardia) (HCC)    remote episode during exercise test  . Thoracic aortic aneurysm [facility])    last scan in Jan 2013. Now measuring 5.0cm. Referred to Dr. Tyrone Sage  . Wears dentures    bottom  . Wears glasses     Past  Surgical History:  Procedure Laterality Date  . CERVICAL FUSION  03-2010   c2-c7  . COLONOSCOPY     x4  . CORONARY STENT PLACEMENT  2004  . dental implant     x 2  . ELBOW SURGERY  1997   lt and rt  . EYE SURGERY    . HIP PINNING,CANNULATED Right 05/11/2014   Procedure: CANNULATED HIP PINNING;  Surgeon: Sheral Apley, MD;  Location: [facility] OR;  Service: Orthopedics;  Laterality: Right;  . RETINAL DETACHMENT SURGERY  1975   lt  . rotator cuff surgery  1997   lt  . SHOULDER ARTHROSCOPY  2/15   right-got cardiac clearance-gsc  . TONSILLECTOMY    . TRIGGER FINGER RELEASE Left 10/12/2013   Procedure: LEFT LONG FINGER TRIGGER RELEASE;  Surgeon: Jodi Marble, MD;  Location: [facility];  Service: Orthopedics;  Laterality: Left;    Current Outpatient Medications  Medication Sig Dispense Refill  . aspirin EC 81 MG tablet Take 1 tablet (81 mg total) by mouth daily. (Patient taking differently: Take 81 mg by mouth daily. Takes 1/2 tab of 325 mg per day) 90 tablet 3  . Coenzyme Q10 (COQ-10) 200 MG CAPS Take 200  mg by mouth daily after supper.     . colesevelam (WELCHOL) 625 MG tablet Take 3,750 mg by mouth daily after supper. 6 tablets    . ezetimibe (ZETIA) 10 MG tablet Take 5 mg by mouth daily. Takes half tab of 10mg     . fluticasone (FLONASE) 50 MCG/ACT nasal spray Place 1 spray into the nose 2 (two) times daily as needed (seasonal allergies/ drainage).     . Glucosamine-Chondroit-Vit C-Mn (GLUCOSAMINE CHONDR 1500 COMPLX) CAPS Take 2 capsules by mouth daily after supper.     . hydrocortisone 2.5 % cream Apply 1 application topically as needed (RASH). Use on head    . lisinopril (PRINIVIL,ZESTRIL) 40 MG tablet Take 40 mg by mouth daily after supper.     . metroNIDAZOLE (METROGEL) 0.75 % gel Apply 1 application topically 2 (two) times daily.     . Multiple Vitamin (MULTIVITAMIN WITH MINERALS) TABS tablet Take 1 tablet by mouth daily after supper.    . nitroGLYCERIN (NITROSTAT)  0.4 MG SL tablet Place 1 tablet (0.4 mg total) under the tongue every 5 (five) minutes as needed for chest pain. 25 tablet 11  . omeprazole (PRILOSEC) 20 MG capsule Take 20 mg by mouth daily after supper.     . Pitavastatin Calcium 4 MG TABS Take 2 mg by mouth daily after supper. Takes half of 4mg  tab     No current facility-administered medications for this visit.     Allergies  Allergen Reactions  . Promethazine Hcl Other (See Comments)    Agitation and incoherence (phenergan)  . Statins Other (See Comments)    myalgias  . Vesicare [Solifenacin Succinate] Itching and Rash    Social History   Socioeconomic History  . Marital status: Married    Spouse name: Not on file  . Number of children: 1  . Years of education: Not on file  . Highest education level: Not on file  Occupational History  . Occupation: retired Advertising account executive: RETIRED  Social Needs  . Financial resource strain: Not on file  . Food insecurity:    Worry: Not on file    Inability: Not on file  . Transportation needs:    Medical: Not on file    Non-medical: Not on file  Tobacco Use  . Smoking status: Former Smoker    Last attempt to quit: 04/09/1969    Years since quitting: 48.6  . Smokeless tobacco: Current User    Types: Chew  . Tobacco comment: reports that when he smoked it was intermittent  Substance and Sexual Activity  . Alcohol use: Yes    Alcohol/week: 1.0 standard drinks    Types: 1 Standard drinks or equivalent per week    Comment: occasional mainly during holiday  . Drug use: No  . Sexual activity: Yes  Lifestyle  . Physical activity:    Days per week: Not on file    Minutes per session: Not on file  . Stress: Not on file  Relationships  . Social connections:    Talks on phone: Not on file    Gets together: Not on file    Attends religious service: Not on file    Active member of club or organization: Not on file    Attends meetings of clubs or organizations: Not on file      Relationship status: Not on file  . Intimate partner violence:    Fear of current or ex partner: Not on file  Emotionally abused: Not on file    Physically abused: Not on file    Forced sexual activity: Not on file  Other Topics Concern  . Not on file  Social History Narrative  . Not on file    Family History  Problem Relation Age of Onset  . Heart disease Father   . Heart attack Father        2  . Cancer Mother 69       LUNG  . Hypertension Neg Hx   . Stroke Neg Hx     Review of Systems:  As stated in the HPI and otherwise negative.   BP 136/80   Pulse 62   Ht 6\' 7"  (2.007 m)   Wt 276 lb 12.8 oz (125.6 kg)   SpO2 97%   BMI 31.18 kg/m   Physical Examination:  General: Well developed, well nourished, NAD  HEENT: OP clear, mucus membranes moist  SKIN: warm, dry. No rashes. Neuro: No focal deficits  Musculoskeletal: Muscle strength 5/5 all ext  Psychiatric: Mood and affect normal  Neck: No JVD, no carotid bruits, no thyromegaly, no lymphadenopathy.  Lungs:Clear bilaterally, no wheezes, rhonci, crackles Cardiovascular: Regular rate and rhythm. Diastolic murmur noted.  Abdomen:Soft. Bowel sounds present. Non-tender.  Extremities: No lower extremity edema. Pulses are 2 + in the bilateral DP/PT.  Cardiac cath 06/08/11: Left main: There are separate ostia for the LAD and Circumflex.  Left Anterior Descending Artery: Large vessel that courses to the apex. No obstructive disease noted.Small diagonal branch.  Circumflex Artery: Large caliber vessel with no obstructive disease noted.  Right Coronary Artery: Very large, dominant vessel with patent proximal stent. There is mild plaque disease in the mid and distal RCA. The small caliber PDA has 60% stenosis.  Left Ventricular Angiogram: Dilated LV. LVEF=50%.  Aortic root: Dilated aortic root and ascending aorta.  Impression:  1. 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 Preserved LV systolic function  3. Dilated  aortic root with ascending aortic aneurysm.   Echo 09/01/15: Left ventricle: The cavity size was mildly dilated. Wall  thickness was increased in a pattern of mild LVH. Systolic  function was at the lower limits of normal. The estimated  ejection fraction was in the range of 50% to 55%. Wall motion was  normal; there were no regional wall motion abnormalities.  Features are consistent with a pseudonormal left ventricular  filling pattern, with concomitant abnormal relaxation and  increased filling pressure (grade 2 diastolic dysfunction). - Aortic valve: There was mild to moderate regurgitation directed  centrally in the LVOT, eccentrically in the LVOT, and towards the  mitral anterior leaflet. - Pulmonary arteries: PA peak pressure: 32 mm Hg (S).  EKG:  EKG is ordered today. The ekg ordered today demonstrates sinus rhythm with LAFB.   Recent Labs: No results found for requested labs within last 8760 hours.   Lipid Panel Followed in primary care   Wt Readings from Last 3 Encounters:  11/15/17 276 lb 12.8 oz (125.6 kg)  07/04/17 274 lb (124.3 kg)  10/19/16 267 lb (121.1 kg)     Other studies Reviewed: Additional studies/ records that were reviewed today include: . Review of the above records demonstrates:    Assessment and Plan:   1. CAD without angina: No chest pain. Last cath in 2013 with patent stent RCA, mild disease in the LAD and Circumflex. Stress test 2017 with no ischemia. Continue statin and ASA. No beta blocker due to bradycardia.  2. Thoracic Aortic aneurysm: Followed in the CT surgery clinic by Dr. Tyrone SageGerhardt. Chest CTA March 2019 with stable 4.8 cm ascending aortic aneurysm. His blood pressure is controlled.   3. HTN: BP is controlled  4. HLD: Lipids followed in primary care and well controlled. Continue statin and Zetia.   5. Aortic Valve disease: Mild to moderate AI by echo in 2017. Will repeat echo now.   Current medicines are reviewed at length  with the patient today.  The patient does not have concerns regarding medicines.  The following changes have been made:  no change  Labs/ tests ordered today include:   Orders Placed This Encounter  Procedures  . EKG 12-Lead  . ECHOCARDIOGRAM COMPLETE    Disposition:   FU with me in 12 months  Signed, Verne Carrowhristopher Hazelee Harbold, MD 11/15/2017 10:07 AM    [facility] [address], [place], [place]  [address] Phone: [phone]; Fax: [phone]

## 2017-11-15 ENCOUNTER — Encounter: Payer: Self-pay | Admitting: Cardiovascular Disease

## 2017-11-15 ENCOUNTER — Ambulatory Visit: Payer: Medicare Other | Admitting: Cardiovascular Disease

## 2017-11-15 VITALS — BP 136/80 | HR 62 | Ht 79.0 in | Wt 276.8 lb

## 2017-11-15 DIAGNOSIS — I10 Essential (primary) hypertension: Secondary | ICD-10-CM | POA: Diagnosis not present

## 2017-11-15 DIAGNOSIS — I712 Thoracic aortic aneurysm, without rupture, unspecified: Secondary | ICD-10-CM

## 2017-11-15 DIAGNOSIS — I2510 Atherosclerotic heart disease of native coronary artery without angina pectoris: Secondary | ICD-10-CM | POA: Diagnosis not present

## 2017-11-15 DIAGNOSIS — I351 Nonrheumatic aortic (valve) insufficiency: Secondary | ICD-10-CM

## 2017-11-15 DIAGNOSIS — E7800 Pure hypercholesterolemia, unspecified: Secondary | ICD-10-CM | POA: Diagnosis not present

## 2017-11-15 NOTE — Patient Instructions (Signed)
Medication Instructions:  Your physician recommends that you continue on your current medications as directed. Please refer to the Current Medication list given to you today.   Labwork: none  Testing/Procedures: Your physician has requested that you have an echocardiogram. Echocardiography is a painless test that uses sound waves to create images of your heart. It provides your doctor with information about the size and shape of your heart and how well your heart's chambers and valves are working. This procedure takes approximately one hour. There are no restrictions for this procedure.    Follow-Up: Your physician recommends that you schedule a follow-up appointment in: 12 months. Please call our office in about 8 months to schedule this appointment    Any Other Special Instructions Will Be Listed Below (If Applicable).     If you need a refill on your cardiac medications before your next appointment, please call your pharmacy.   

## 2017-12-16 ENCOUNTER — Ambulatory Visit (HOSPITAL_COMMUNITY): Payer: Medicare Other | Attending: Cardiology

## 2017-12-16 ENCOUNTER — Other Ambulatory Visit: Payer: Self-pay

## 2017-12-16 DIAGNOSIS — I2510 Atherosclerotic heart disease of native coronary artery without angina pectoris: Secondary | ICD-10-CM | POA: Insufficient documentation

## 2017-12-16 DIAGNOSIS — I351 Nonrheumatic aortic (valve) insufficiency: Secondary | ICD-10-CM | POA: Insufficient documentation

## 2017-12-16 DIAGNOSIS — I471 Supraventricular tachycardia: Secondary | ICD-10-CM | POA: Diagnosis not present

## 2017-12-16 DIAGNOSIS — E785 Hyperlipidemia, unspecified: Secondary | ICD-10-CM | POA: Insufficient documentation

## 2017-12-16 DIAGNOSIS — I712 Thoracic aortic aneurysm, without rupture: Secondary | ICD-10-CM | POA: Insufficient documentation

## 2017-12-16 MED ORDER — PERFLUTREN LIPID MICROSPHERE
1.0000 mL | INTRAVENOUS | Status: AC | PRN
  Administered 2017-12-16: 2 mL via INTRAVENOUS

## 2018-02-27 ENCOUNTER — Other Ambulatory Visit: Payer: Self-pay | Admitting: Internal Medicine

## 2018-02-27 ENCOUNTER — Ambulatory Visit
Admission: RE | Admit: 2018-02-27 | Discharge: 2018-02-27 | Disposition: A | Discharge status: Home / Self Care | Payer: Medicare Other | Source: Ambulatory Visit | Attending: Internal Medicine | Admitting: Internal Medicine

## 2018-02-27 DIAGNOSIS — R2681 Unsteadiness on feet: Secondary | ICD-10-CM

## 2018-02-27 DIAGNOSIS — R51 Headache: Secondary | ICD-10-CM

## 2018-02-27 DIAGNOSIS — H538 Other visual disturbances: Secondary | ICD-10-CM

## 2018-02-27 DIAGNOSIS — R519 Headache, unspecified: Secondary | ICD-10-CM

## 2018-05-07 ENCOUNTER — Other Ambulatory Visit: Payer: Self-pay | Admitting: Cardiothoracic Surgery

## 2018-05-07 DIAGNOSIS — I712 Thoracic aortic aneurysm, without rupture, unspecified: Secondary | ICD-10-CM

## 2018-06-12 ENCOUNTER — Other Ambulatory Visit: Payer: Self-pay | Admitting: Internal Medicine

## 2018-06-12 ENCOUNTER — Ambulatory Visit
Admission: RE | Admit: 2018-06-12 | Discharge: 2018-06-12 | Disposition: A | Discharge status: Home / Self Care | Payer: Medicare Other | Source: Ambulatory Visit | Attending: Cardiothoracic Surgery | Admitting: Cardiothoracic Surgery

## 2018-06-12 ENCOUNTER — Other Ambulatory Visit: Payer: Self-pay | Admitting: Cardiothoracic Surgery

## 2018-06-12 ENCOUNTER — Ambulatory Visit
Admission: RE | Admit: 2018-06-12 | Discharge: 2018-06-12 | Disposition: A | Discharge status: Home / Self Care | Payer: Medicare Other | Source: Ambulatory Visit | Attending: Internal Medicine | Admitting: Internal Medicine

## 2018-06-12 DIAGNOSIS — K439 Ventral hernia without obstruction or gangrene: Secondary | ICD-10-CM

## 2018-06-12 DIAGNOSIS — I712 Thoracic aortic aneurysm, without rupture, unspecified: Secondary | ICD-10-CM

## 2018-06-12 MED ORDER — IOPAMIDOL (ISOVUE-370) INJECTION 76%: 75.0000 mL | Freq: Once | INTRAVENOUS | Status: DC | PRN

## 2018-06-12 MED ORDER — IOPAMIDOL (ISOVUE-300) INJECTION 61%
125.0000 mL | Freq: Once | INTRAVENOUS | Status: AC | PRN
  Administered 2018-06-12: 125 mL via INTRAVENOUS

## 2018-06-25 ENCOUNTER — Other Ambulatory Visit: Payer: Self-pay

## 2018-06-26 ENCOUNTER — Other Ambulatory Visit: Payer: Medicare Other

## 2018-06-26 ENCOUNTER — Ambulatory Visit: Payer: Medicare Other | Admitting: Cardiothoracic Surgery

## 2018-06-26 ENCOUNTER — Inpatient Hospital Stay: Admission: RE | Admit: 2018-06-26 | Payer: Medicare Other | Source: Ambulatory Visit

## 2018-08-21 ENCOUNTER — Other Ambulatory Visit: Payer: Self-pay

## 2018-08-21 ENCOUNTER — Ambulatory Visit
Admission: RE | Admit: 2018-08-21 | Discharge: 2018-08-21 | Disposition: A | Discharge status: Home / Self Care | Payer: Medicare Other | Source: Ambulatory Visit | Attending: Cardiothoracic Surgery | Admitting: Cardiothoracic Surgery

## 2018-08-21 ENCOUNTER — Ambulatory Visit: Payer: Medicare Other | Admitting: Cardiothoracic Surgery

## 2018-08-21 ENCOUNTER — Encounter: Payer: Self-pay | Admitting: Cardiothoracic Surgery

## 2018-08-21 VITALS — BP 138/85 | HR 88 | Temp 98.7°F | Resp 16 | Ht 79.0 in | Wt 288.0 lb

## 2018-08-21 DIAGNOSIS — R918 Other nonspecific abnormal finding of lung field: Secondary | ICD-10-CM | POA: Diagnosis not present

## 2018-08-21 DIAGNOSIS — I712 Thoracic aortic aneurysm, without rupture, unspecified: Secondary | ICD-10-CM

## 2018-08-21 MED ORDER — IOPAMIDOL (ISOVUE-370) INJECTION 76%
75.0000 mL | Freq: Once | INTRAVENOUS | Status: AC | PRN
  Administered 2018-08-21: 75 mL via INTRAVENOUS

## 2018-08-21 NOTE — Patient Instructions (Signed)

## 2018-08-21 NOTE — Progress Notes (Signed)
[address]       [place] [id number]             [phone]                                                       Daleen SquibbJames S Nagengast [facility] Medical Record #[id number] Date of Birth: 1940/09/09  Referring: Kathleene HazelMcAlhany, Christopher D* Primary Care: Rodrigo RanPerini, Mark, MD  Chief Complaint:    Dilated aorta    History of Present Illness:    Patient is a 78 year-old male with known coronary occlusive disease having a stents placed in his right coronary artery 12  years ago. He is also known to have a dilated ascending aorta this is been followed since 2004 with serial CT scans. Scans dating back to 2004 showed aorta at 4.5 cm in 2004.  Patient has no symptoms of angina or congestive heart failure    There is no family history of aortic aneurysm or aortic dissections. His father died at age 76 in [id number] sudden death while at a picnic attributed to "cardiac spasm". The patient does chew tobacco, previously smoked cigarettes more than 25 years ago for 1 year.   His primary complaint remains chronic back pain and knee pain making it difficult to be active.   Current Activity/ Functional Status: Patient is independent with mobility/ambulation, transfers, ADL's, IADL's.   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    Thoracic aortic aneurysm     last scan in Jan 2013. Now measuring 5.0cm. Referred to Dr. Glean SalenGerhardt   Malaria  patient had 3 episodes of malaria while serving in [place]    SVT (supraventricular tachycardia)     remote episode during exercise test   Lung nodules     noted on past CT scans    Past Surgical History:  Procedure Laterality Date   CERVICAL FUSION  03-2010   c2-c7   COLONOSCOPY     x4   CORONARY STENT PLACEMENT  2004   dental  implant     x 2   ELBOW SURGERY  1997   lt and rt   EYE SURGERY     HIP PINNING,CANNULATED Right 05/11/2014   Procedure: CANNULATED HIP PINNING;  Surgeon: Sheral Apleyimothy D Murphy, MD;  Location: [facility] OR;  Service: Orthopedics;  Laterality: Right;   RETINAL DETACHMENT SURGERY  1975   lt   rotator cuff surgery  1997   lt   SHOULDER ARTHROSCOPY  2/15   right-got cardiac clearance-gsc   TONSILLECTOMY     TRIGGER FINGER RELEASE Left 10/12/2013   Procedure: LEFT LONG FINGER TRIGGER RELEASE;  Surgeon: Jodi Marbleavid A Thompson, MD;  Location:  SUR[facility]ERY C[facility];  Service: Orthopedics;  Laterality: Left;    Family History  Problem Relation Age of Onset   Heart disease Father    Heart attack Father        [id number]   Cancer Mother [id number]       LUNG   Hypertension Neg Hx    Stroke Neg  Hx     History   Social History   Marital Status: Married    Spouse Name: N/A    Number of Children: 1   Years of Education: N/A   Occupational History   retired Engineer, 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    Social History Main Topics   Smoking status: Former Smoker    Quit date: 04/09/1969   Smokeless tobacco: Former Neurosurgeon    Types: Chew    Quit date: 03/09/2010   Alcohol Use: 0.5 oz/week    1 drink(s) per week     occasional   Drug Use: No   Sexually Active: Yes    Social History Narrative       Social History   Tobacco Use  Smoking Status Former Smoker   Last attempt to quit: 04/09/1969   Years since quitting: 49.4  Smokeless Tobacco Current User   Types: Chew  Tobacco Comment   reports that when he smoked it was intermittent    Social History   Substance and Sexual Activity  Alcohol Use Yes   Alcohol/week: 1.0 standard drinks   Types: 1 Standard drinks or equivalent per week   Comment: occasional mainly during holiday     Allergies  Allergen Reactions   Promethazine Hcl Other (See Comments)    Agitation and incoherence (phenergan)   Statins  Other (See Comments)    myalgias   Vesicare [Solifenacin Succinate] Itching and Rash    Current Outpatient Medications  Medication Sig Dispense Refill   aspirin EC 81 MG tablet Take 1 tablet (81 mg total) by mouth daily. (Patient taking differently: Take 81 mg by mouth daily. Takes 1/2 tab of 325 mg per day) 90 tablet 3   Coenzyme Q10 (COQ-10) 200 MG CAPS Take 200 mg by mouth daily after supper.      colesevelam (WELCHOL) 625 MG tablet Take 3,750 mg by mouth daily after supper. 6 tablets     ezetimibe (ZETIA) 10 MG tablet Take 5 mg by mouth daily. Takes half tab of      fluticasone (FLONASE) 50 MCG/ACT nasal spray Place 1 spray into the nose 2 (two) times daily as needed (seasonal allergies/ drainage).      Glucosamine-Chondroit-Vit C-Mn (GLUCOSAMINE CHONDR 1500 COMPLX) CAPS Take 2 capsules by mouth daily after supper.      hydrocortisone 2.5 % cream Apply 1 application topically as needed (RASH). Use on head     lisinopril (PRINIVIL,ZESTRIL) 40 MG tablet Take 40 mg by mouth daily after supper.      metroNIDAZOLE (METROGEL) 0.75 % gel Apply 1 application topically 2 (two) times daily.      Multiple Vitamin (MULTIVITAMIN WITH MINERALS) TABS tablet Take 1 tablet by mouth daily after supper.     nitroGLYCERIN (NITROSTAT) 0.4 MG SL tablet Place 1 tablet (0.4 mg total) under the tongue every 5 (five) minutes as needed for chest pain. 25 tablet 11   omeprazole (PRILOSEC) 20 MG capsule Take 20 mg by mouth daily after supper.      Pitavastatin Calcium 4 MG TABS Take 2 mg by mouth daily after supper. Takes half of  tab     No current facility-administered medications for this visit.        Review of Systems:     Cardiac Review of Systems: Y or N  Chest Pain [ N  ]  Resting SOB Klaus.Mock ] Exertional SOB  [N]  Orthopnea Klaus.Mock  ]   Pedal  Edema [MILD]    Palpitations [ N ] Syncope  [ N]   Presyncope Klaus.Mock  ]  General Review of Systems: [Y] = yes [  ]=no Constitional: recent weight change  [  ]; anorexia [  ]; fatigue [  ]; nausea [  ]; night sweats [  ]; fever [  ]; or chills [  ];                                                                                                                                          Dental: poor dentition[ y caps]; Last Dentist visit: last 6 months  Eye : blurred vision [  ]; diplopia [   ]; vision changes [  ];  Amaurosis fugax[  ]; Resp: cough [  ];  wheezing[  ];  hemoptysis[  ]; shortness of breath[  ]; paroxysmal nocturnal dyspnea[  ]; dyspnea on exertion[  ]; or orthopnea[  ];  GI:  gallstones[  ], vomiting[  ];  dysphagia[  ]; melena[  ];  hematochezia [  ]; heartburn[  ];   Hx of  Colonoscopy[Y ]; GU: kidney stones [  ]; hematuria[  ];   dysuria [ y ];  nocturia[  y];  history of     obstruction [ n ];             Skin: rash, swelling[  ];, hair loss[  ];  peripheral edema[  ];  or itching[  ]; Musculosketetal: myalgias[  ];  joint swelling[  ];  joint erythema[  ];  joint pain[  ];  back pain[  ];  Heme/Lymph: bruising[  ];  bleeding[  ];  anemia[  ];  Neuro: TIA[  ];  headaches[  ];  stroke[  ];  vertigo[  ];  seizures[  ];   paresthesias[  ];  difficulty walking[ yes, chronic back pain and bilaterial knee pain ];  Psych:depression[  ]; anxiety[  ];  Endocrine: diabetes[ N ];  thyroid dysfunction[N ];  Immunizations: Flu [ Y ]; Pneumococcal[Y ]; and shingles  Other:  Physical Exam: BP 138/85 (BP Location: Left Arm, Patient Position: Sitting, Cuff Size: Large)    Pulse 88    Temp 98.7 F (37.1 C) (Oral) Comment: 98.7   Resp 16    Ht 6\' 7"  (2.007 m)    Wt 288 lb (130.6 kg)    SpO2 95% Comment: RA   BMI 32.44 kg/m  General appearance: alert, cooperative and no distress Head: Normocephalic, without obvious abnormality, atraumatic Neck: no adenopathy, no carotid bruit, no JVD, supple, symmetrical, trachea midline and thyroid not enlarged, symmetric, no tenderness/mass/nodules Lymph nodes: Cervical, supraclavicular, and axillary nodes  normal. Resp: clear to auscultation bilaterally Cardio: regular rate and rhythm, S1, S2 normal, no murmur, click, rub or gallop GI: soft, non-tender; bowel sounds normal; no masses,  no organomegaly Extremities: extremities normal,  atraumatic, no cyanosis or edema and Homans sign is negative, no sign of DVT Neurologic: Grossly normal  Patient has large body stature with a body surface area of 2.6 however he has no features of Marfan's,  Diagnostic Studies & Laboratory data:     Recent Radiology Findings: Ct Angio Chest Aorta W/cm &/or Wo/cm  Result Date: 08/21/2018 CLINICAL DATA:  Follow-up thoracic aortic aneurysm EXAM: CT ANGIOGRAPHY CHEST WITH CONTRAST TECHNIQUE: Multidetector CT imaging of the chest was performed using the standard protocol during bolus administration of intravenous contrast. Multiplanar CT image reconstructions and MIPs were obtained to evaluate the vascular anatomy. CONTRAST:  75mL ISOVUE-370 COMPARISON:  07/04/2017 FINDINGS: Cardiovascular: Ascending aortic aneurysmal dilatation is again identified measuring up to 4.8 cm. The overall appearance is stable. No dissection is identified. Normal tapering distally is seen. The heart is not significantly enlarged in size. Coronary calcifications are again noted and stable. The pulmonary artery as visualized shows no large central embolus. Mediastinum/Nodes: Thoracic inlet is within normal limits. No hilar or mediastinal adenopathy is noted. The esophagus as visualized is within normal limits. Lungs/Pleura: Lungs are well aerated bilaterally. Multiple small subpleural nodules are identified stable from the previous exam as well as more previous exams. The previously seen lingular nodule is no longer identified and was likely related to inflammatory change. No new focal abnormality is seen. Upper Abdomen: Visualized upper abdomen is within normal limits. Musculoskeletal: Degenerative changes of the thoracic spine are seen. No compression  deformities are noted. No acute bony abnormality is noted. Postsurgical changes in the cervical spine are noted. Review of the MIP images confirms the above findings. IMPRESSION: Stable dilatation of the ascending aorta 4.8 cm. Ascending thoracic aortic aneurysm. Recommend semi-annual imaging followup by CTA or MRA and referral to cardiothoracic surgery if not already obtained. This recommendation follows 2010 ACCF/AHA/AATS/ACR/ASA/SCA/SCAI/SIR/STS/SVM Guidelines for the Diagnosis and Management of Patients With Thoracic Aortic Disease. Circulation. 2010; 121: Z610-R604. Aortic aneurysm NOS (ICD10-I71.9) Stable pulmonary nodules bilaterally. The previously seen lingular nodule has resolved and was likely postinflammatory in nature. No new focal abnormality is seen. Aortic aneurysm NOS (ICD10-I71.9). Electronically Signed   By: Alcide Clever M.D.   On: 08/21/2018 14:06   I have independently reviewed the above radiology studies  and reviewed the findings with the patient.  Ct Angio Chest Aorta W/cm &/or Wo/cm  Result Date: 07/04/2017 CLINICAL DATA:  Thoracic aortic aneurysm follow-up. EXAM: CT ANGIOGRAPHY CHEST WITH CONTRAST TECHNIQUE: Multidetector CT imaging of the chest was performed using the standard protocol during bolus administration of intravenous contrast. Multiplanar CT image reconstructions and MIPs were obtained to evaluate the vascular anatomy. CONTRAST:  75mL ISOVUE-370 IOPAMIDOL (ISOVUE-370) INJECTION 76% Creatinine was obtained on site at [facility] at [address] Results: Creatinine 1.0 mg/dL. COMPARISON:  CT chest dated June 07, 2016. FINDINGS: Cardiovascular: The heart size is normal. No pericardial effusion. Stable dilatation of the ascending thoracic aorta, measuring up to 4.8 cm. There is also aneurysmal dilatation of the proximal descending thoracic aorta, measuring up to 4.1 cm, also unchanged. No aortic dissection. No pulmonary embolism. Coronary artery atherosclerosis.  Mild reflux of contrast into the intrahepatic veins. Mediastinum/Nodes: No enlarged mediastinal, hilar, or axillary lymph nodes. Thyroid gland, trachea, and esophagus demonstrate no significant findings. Lungs/Pleura: New 5 mm pulmonary nodule in the lingula (series 7, image 111). Other scattered tiny pulmonary nodules in both lungs are unchanged since 2011 and benign. No focal consolidation, pleural effusion, or pneumothorax. Upper Abdomen: No acute abnormality. Musculoskeletal:  No chest wall abnormality. No acute or significant osseous findings. Review of the MIP images confirms the above findings. IMPRESSION: 1. Stable ascending thoracic aortic aneurysm, measuring 4.8 cm. Recommend semi-annual imaging followup by CTA or MRA and referral to cardiothoracic surgery if not already obtained. This recommendation follows 2010 ACCF/AHA/AATS/ACR/ASA/SCA/SCAI/SIR/STS/SVM Guidelines for the Diagnosis and Management of Patients With Thoracic Aortic Disease. Circulation. 2010; 121: Y782-N562 2. Unchanged aneurysmal dilatation of the proximal descending thoracic aorta, measuring 4.1 cm. 3. New 5 mm pulmonary nodule in the lingula. No follow-up needed if patient is low-risk. Non-contrast chest CT can be considered in 12 months if patient is high-risk. This recommendation follows the consensus statement: Guidelines for Management of Incidental Pulmonary Nodules Detected on CT Images: From the Fleischner Society 2017; Radiology 2017; 284:228-243. 4. Reflux of contrast into the intrahepatic veins may reflect a degree of right heart dysfunction. Electronically Signed   By: Obie Dredge M.D.   On: 07/04/2017 10:08   Ct Chest W Contrast  Result Date: 06/07/2016 CLINICAL DATA:  Lung nodules.  Known thoracic aneurysm. EXAM: CT CHEST WITH CONTRAST TECHNIQUE: Multidetector CT imaging of the chest was performed during intravenous contrast administration. CONTRAST:  75mL ISOVUE-300 IOPAMIDOL (ISOVUE-300) INJECTION 61% COMPARISON:   05/26/2015.  12/02/2014. FINDINGS: Cardiovascular: Heart size upper normal. Coronary artery calcification is noted. Atherosclerotic calcification is noted in the wall of the thoracic aorta. Ascending thoracic aorta measures 4.7 cm diameter. Mediastinum/Nodes: No mediastinal lymphadenopathy. No evidence for gross hilar lymphadenopathy although assessment is limited by the lack of intravenous contrast on today's study. The esophagus has normal imaging features. There is no axillary lymphadenopathy. Lungs/Pleura: Multiple tiny bilateral pulmonary nodules are again identified. Imaging today is degraded by breathing motion which obscures fine architectural detail in the lungs. Within this limitation, no change in the numerous bilateral scattered pulmonary nodules is apparent. Previous study compare these nodules back to an exam from 05/09/2009 and demonstrates stability, consistent with benign process. No new or progressive pulmonary nodule on today's study. No focal airspace consolidation. No pulmonary edema or pleural effusion. Upper Abdomen: Unremarkable. Musculoskeletal: Sclerotic lesion left L2 transverse process stable back to a study from 02/24/2015. IMPRESSION: 1. Continued interval stability of numerous tiny bilateral pulmonary nodules. 2. Stable aneurysm of the ascending thoracic aorta, measuring 4.7 cm today compared to 4.8 cm previously. Recommend semi-annual imaging followup by CTA or MRA and referral to cardiothoracic surgery if not already obtained. This recommendation follows 2010 ACCF/AHA/AATS/ACR/ASA/SCA/SCAI/SIR/STS/SVM Guidelines for the Diagnosis and Management of Patients With Thoracic Aortic Disease. Circulation. 2010; 121: Z308-M578. 3. Coronary artery atherosclerosis. Electronically Signed   By: Kennith Center M.D.   On: 06/07/2016 16:00   Ct Angio Chest Aorta W/cm &/or Wo/cm  12/02/2014   CLINICAL DATA:  Follow-up lung nodule and thoracic aortic aneurysm, ex-smoker.  EXAM: CT ANGIOGRAPHY CHEST  WITH CONTRAST  TECHNIQUE: Multidetector CT imaging of the chest was performed using the standard protocol during bolus administration of intravenous contrast. Multiplanar CT image reconstructions and MIPs were obtained to evaluate the vascular anatomy.  CONTRAST:  75 cc of Isovue 370  COMPARISON:  Chest CTs dated 08/06/2014, 12/03/2013, and 11/13/2012.  FINDINGS: The benign 4 mm pulmonary nodule within the left lateral lingula is stable. The 5 mm pulmonary nodule within the medial aspects of the superior segment of the right lower lobe is stable compared to the chest CT of 08/06/2014 and is also stable compared to the earlier study of 11/13/2012 indicating benignity.  There is a new 6 mm pulmonary  nodule within the lateral aspects of the right lower lobe and there is a new 5 mm pulmonary nodule within the medial aspects of the right lower lobe (series 5, images 41 and 40 respectively). There is an additional questionable pulmonary nodule within the right lower lobe posteriorly measuring 7 mm (series 5, image 38). No other new lung findings seen.  The dilatation of the ascending thoracic aorta is unchanged at 4.6 cm diameter. Distal thoracic aortic arch is also again mildly prominent measuring 3.7 cm diameter, unchanged. No aortic dissection.  Heart size is upper normal, unchanged. Coronary artery calcifications noted within the left anterior descending and left circumflex coronary arteries. No mass or enlarged lymph nodes seen within the mediastinum or perihilar regions. Supraclavicular regions are unremarkable. Limited images of the upper abdomen are unremarkable. Degenerative changes within the thoracic spine are unchanged. No acute osseous abnormality.  Review of the MIP images confirms the above findings.  IMPRESSION: 1. New pulmonary nodules within the right lower lobe, as detailed above. These include a new 6 mm pulmonary nodule within the lateral aspects of the right lower lobe and a new 5 mm pulmonary nodule  within the medial aspects of the right lower lobe. There is also a questionable pulmonary nodule within the right lower lobe posteriorly measuring 7 mm greatest dimension. As the patient is at high risk for bronchogenic carcinoma, follow-up chest CT at 3-57months is recommended. This recommendation follows the consensus statement: Guidelines for Management of Small Pulmonary Nodules Detected on CT Scans: A Statement from the Fleischner Society as published in Radiology 2005; 237:395-400. 2. Remainder of the pulmonary nodules have been stable for at least 2 years indicating benignity. 3. Stable ascending thoracic aortic aneurysm measuring 4.6 cm diameter. 4. Coronary artery calcifications.   Electronically Signed   By: Bary Richard M.D.   On: 12/02/2014 14:26     12/03/2013   CLINICAL DATA:  Thoracic aortic aneurysm.  EXAM: CT ANGIOGRAPHY CHEST WITH CONTRAST  TECHNIQUE: Multidetector CT imaging of the chest was performed using the standard protocol during bolus administration of intravenous contrast. Multiplanar CT image reconstructions and MIPs were obtained to evaluate the vascular anatomy.  CONTRAST:  26mL OMNIPAQUE IOHEXOL 350 MG/ML SOLN  COMPARISON:  CT scan of November 13, 2012 and December 26, 2011.  FINDINGS: No pneumothorax or pleural effusion is noted. Stable 4.5 mm nodule is noted in left lingula laterally which is unchanged compared with 2013 and can be considered benign at this point. 4.8 cm nodule is noted in the medial portion of the superior segment of the right lower lobe best seen on image number 26 of series 5. This is unchanged compared to prior exam. No acute pulmonary disease is noted. Coronary artery calcifications are again noted. No significant mediastinal mass or adenopathy is noted. Ascending thoracic aorta has maximum measured diameter 4.6 cm which is not changed compared to prior exam. No dissection or significant atheromatous disease is noted. Normal caliber of descending thoracic aorta  is noted. Visualized portion of upper abdomen appears normal. The great vessels appear to be widely patent without significant stenosis. No significant osseous abnormality is noted.  Review of the MIP images confirms the above findings.  IMPRESSION: 4.6 cm ascending thoracic aortic aneurysm is noted which is unchanged compared to prior exam.  Coronary artery calcifications are noted consistent with coronary artery disease.  Stable stable 4.8 cm nodule seen in superior segment of right lower lobe ; followup chest CT in 12 months is recommended to  ensure stability.   Electronically Signed   By: Roque Lias M.D.   On: 12/03/2013 14:47   I have discussed with radiology and reviewed the films, should be 4.8 mm not cm nodule  Ct Angio Chest Aorta W/cm &/or Wo/cm  11/13/2012   *RADIOLOGY REPORT*  Clinical Data: Aortic aneurysm  CT ANGIOGRAPHY CHEST  Technique:  Multidetector CT imaging of the chest using the standard protocol during bolus administration of intravenous contrast. Multiplanar reconstructed images including MIPs were obtained and reviewed to evaluate the vascular anatomy.  Contrast: OMNIPAQUE IOHEXOL 350 MG/ML SOLN  Comparison: 12/26/2011  Findings: Aortic diameter at the sinus of Valsalva, sinotubular junction, and ascending aorta are 5.0 cm, 3.7 cm, and 4.6 cm respectively.  No evidence of aortic dissection or transection. Little if any plaque within the thoracic aorta.  Innominate artery, right vertebral artery, right subclavian artery, right common carotid artery, left common carotid artery, left subclavian artery, and left vertebral artery are all widely patent within the confines of the examination.  Right coronary artery stent is noted.  Calcification in the circumflex and left anterior descending coronary vessels are noted. Distal right coronary artery calcifications are noted.  No obvious filling defects in the pulmonary arterial tree to suggest acute pulmonary thromboembolism.  Negative  abnormal mediastinal adenopathy.  No pericardial effusion.  Sub centimeter right thyroid hypodensity on image 17 of series 4.  No pneumothorax.  No pleural effusion.  Calcified lymph nodes in the mediastinum or are noted.  Sub centimeter pulmonary nodules are stable.  Cervical spine fusion hardware is in place.  The right-sided screw within the C7 vertebral body has retracted and is within the prevertebral soft tissues.  Images of the upper abdomen demonstrate diffuse hepatic steatosis. Prominence of the adrenal glands without focal mass is stable.  IMPRESSION: Maximal diameter of the ascending aorta is 4.6 cm.  Previously, it was measured at 4.9 cm.  It is therefore not enlarging.  No evidence of aortic dissection.  Sub centimeter right thyroid hypodensity is stable in retrospect compared with a study from 2012.  This supports benign etiology.  Cervical spine fusion hardware is in place.  One of the screws in C7 has retracted and is residing within the prevertebral soft tissues.   Original Report Authenticated By: Jolaine Click, M.D.    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  Follow-up study.  CT ANGIOGRAPHY OF THE HEART, CORONARY ARTERY, STRUCTURE, AND MORPHOLOGY  CONTRAST:  100 ml of Omnipaque-300.  COMPARISON:  CTA chest 05/14/2011.  TECHNIQUE:  CT angiography of the coronary vessels was performed on a 256 channel system using prospective ECG gating.  A scout and noncontrast exam (for calcium scoring) were performed.  Circulation time was measured using a test bolus.  Coronary CTA was performed with sub mm slice collimation during portions of the cardiac cycle after prior injection of iodinated contrast.  Imaging post processing was performed on an independent workstation creating multiplanar and 3-D images, and quantitative analysis of the heart and coronary arteries.   Note that this exam targets the heart and the chest was not imaged in its entirety.  PREMEDICATION: Lopressor 5 mg, IV Nitroglycerin 400 mcg, sublingual.  FINDINGS: Technical quality:  Good  Heart rate:  55-60  CORONARY ARTERIES: Left main coronary artery:  Not present (there are separate ostia for the left anterior descending and  left circumflex coronary arteries directly off the left sinus of Valsalva). Left anterior descending:  Mildly diseased with mixed calcified noncalcified atherosclerotic plaque with areas of stenosis up to 25 - 50% in the mid LAD. Left circumflex:  Large caliber vessel that quickly gives rise to two large obtuse marginal vessels.  Proximal left circumflex demonstrates only minimal calcified plaque but no associated stenosis. Obtuse Marginal 1: Mildly diseased with calcified plaque with no associated luminal stenosis. Obtuse Marginal 2: Extensive calcified atherosclerotic plaque proximally with stenosis >50% (possibly >75%), however, secondary to the high degree of calcification, accurate assessment for luminal stenosis is not possible on this examination. Right coronary artery:  There is a stent in the proximal right coronary artery which is grossly patent.  There is some calcified plaque at the distal end of the stent where the vessel is slightly acutely angulated.  There appears to be some stenosis in this region, however, it is favored to be approximately 25 - 50% (accurate assessment is limited by the high-density the stent material and the heavily calcified plaque).  The remainder of the RCA is otherwise mildly diseased with nonobstructive plaque. Posterior descending artery:  Patent with a mixed calcified noncalcified plaque.  Secondary to the small size of the vessel and excessive image noise, accurate assessment for stenosis is not possible on this examination, however, there appears to be at least 25 - 50% stenosis of the mid PDA. Dominance:  Right  CORONARY CALCIUM: Total Agatston  Score:  655 MESA database percentile:  80th Comment - it appears as though the patient has moved or breathed during image acquisition, such that the a portion of the coronary arteries was incompletely visualized.  This likely underestimates the patient's true calcium score.  AORTA AND PULMONARY MEASUREMENTS: Aortic root (21 - 40 mm):             29 mm  at the annulus             49 mm  at the sinuses of Valsalva         36 mm  at the sinotubular junction Ascending aorta ( <  40 mm):  50 mm Aortic arch : 34 mm Descending aorta:  28 mm Main pulmonary artery:  ( <  30 mm):  35 mm  EXTRACARDIAC FINDINGS: Several tiny pulmonary nodules measuring 4 mm are scattered throughout the lungs bilaterally and are completely unchanged compared to prior examinations (dating back to 2007); these can be considered radiographically benign requiring no further imaging follow-up.  Small calcified granulomas are also noted in the posterior aspect of the left upper lobe.  Multiple calcified left hilar and mediastinal lymph nodes are noted.  No acute consolidative airspace disease.  No pleural effusions.  Visualized portions of the upper abdomen are unremarkable.  IMPRESSION:  1. Ascending thoracic aortic aneurysm is similar in size measuring 4.9 cm on today's examination.  No evidence of thoracic aortic dissection at this time. 2.  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  The patient's aortic valve appears to be tricuspid.  Additionally, there is no effacement of the sinotubular junction. 3.  Multivessel coronary artery disease, as detailed above.  Most importantly, there is a heavily calcified plaque in the obtuse marginal 2 branch (a large vessel) which appears to be stenotic (i.e., at least 50% diameter stenosis, and possbily in excess of 75%), however, accurate assessment for percentage stenosis is limited on this examination secondary  to the high degree of  calcification. Clinical correlation is recommended, with consideration for further evaluation with stress testing if there is clinical concern for inducible ischemia. 4.  Coronary artery calcium scoring is at least 655 (the patient moved during the image acquisition, resulting in incomplete coverage of the coronary arteries which likely underestimates the true calcium score), which is 80th percentile for patient's of matched age, gender and race/ethnicity. 5.  Right coronary artery dominance.   Original Report Authenticated By: Florencia Reasons, M.D.     Recent Lab Findings: Lab Results  Component Value Date   WBC 7.7 08/02/2015   HGB 15.2 08/02/2015   HCT 44.3 08/02/2015   PLT 214 08/02/2015   GLUCOSE 87 08/02/2015   CHOL 184 11/14/2010   TRIG 143.0 11/14/2010   HDL 48.60 11/14/2010   LDLCALC 107 (H) 11/14/2010   ALT 16 08/06/2014   AST 22 08/06/2014   NA 142 08/02/2015   K 4.1 08/02/2015   CL 105 08/02/2015   CREATININE 0.92 08/02/2015   BUN 15 08/02/2015   CO2 20 08/02/2015   INR 1.0 06/06/2011    Procedure Performed:  1. Left Heart Catheterization 2. Selective Coronary Angiography 3. Left ventricular angiogram 4. Aortic root angiogram Operator: Verne Carrow, MD  Indication: Pt with known CAD and thoracic aortic aneurysm. The aortic aneurysm has enlarged over the last year and we recently referred Mr. Poffenberger to see Dr. Tyrone Sage. Plans are being made for aortic root replacement. Cardiac cath today to exclude progression of CAD.  Procedure Details:  The risks, benefits, complications, treatment options, and expected outcomes were discussed with the patient. The patient and/or family concurred with the proposed plan, giving informed consent. The patient was brought to the cath lab after IV hydration was begun and oral premedication was given. The patient was further sedated with Versed and Fentanyl. The right groin was prepped and draped in the usual manner. Using the  modified Seldinger access technique, a 4 French sheath was placed in the right femoral artery. A JL-5 catheter was used to engage the LAD with sub-selective shots of the Circumflex. An AL-1 catheter was used to non-selectively inject the Circumflex artery. A 3DRC catheter was used to engage and inject the RCA. A pigtail catheter was used to perform a left ventricular angiogram. The catheter was pulled back across the aortic valve. An aortic root angiogram was performed.  There were no immediate complications. The patient was taken to the recovery area in stable condition.  Hemodynamic Findings:  Central aortic pressure: 130/72  Left ventricular pressure: 130/17/24  Angiographic Findings:  Left main: There are separate ostia for the LAD and Circumflex.  Left Anterior Descending Artery: Large vessel that courses to the apex. No obstructive disease noted.Small diagonal branch.  Circumflex Artery: Large caliber vessel with no obstructive disease noted.  Right Coronary Artery: Very large, dominant vessel with patent proximal stent. There is mild plaque disease in the mid and distal RCA. The small caliber PDA has 60% stenosis.  Left Ventricular Angiogram: Dilated LV. LVEF=50%.  Aortic root: Dilated aortic root and ascending aorta.  Impression:  1. 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 Preserved LV systolic function  3. 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    ECHO 12/16/2017 [facility]                        [address]  [place], [place] [address]                            [phone]  ------------------------------------------------------------------- Transthoracic Echocardiography  Patient:    Khadar, Monger MR #:       [id number] Study Date: 12/16/2017 Gender:     M Age:        77 Height:     200.7 cm Weight:     125.6 kg BSA:        2.67 m^2 Pt. Status: Room:   SONOGRAPHER  Dewitt Hoes, RDCS  ATTENDING    Teresita Madura, Christopher  REFERRING    [place]hany, Christopher  PERFORMING   [facility], [facility]  cc:  ------------------------------------------------------------------- LV EF: 55% -   60%  ------------------------------------------------------------------- Indications:      CAD (I25.1).  ------------------------------------------------------------------- History:   PMH:   Coronary artery disease.  Risk factors:  Thoracic aortic aneurysm. SVT. Dyslipidemia.  ------------------------------------------------------------------- Study Conclusions  - Procedure narrative: Transthoracic echocardiography. Image   quality was suboptimal. The study was technically difficult.   Intravenous contrast (Definity) was administered to opacify the   LV. - Left ventricle: The cavity size was normal. Wall thickness was   increased in a pattern of mild LVH. Systolic function was normal.   The estimated ejection fraction was in the range of 55% to 60%.   Wall motion was normal; there were no regional wall motion   abnormalities. Doppler parameters are consistent with abnormal   left ventricular relaxation (grade 1 diastolic dysfunction). - Aortic valve: Trileaflet. There was no stenosis. Eccentric aortic   insufficiency, appears mild on this study. - Aorta: Severely dilated aortic root and ascending aorta. Aortic   root dimension: 53 mm (ED). Ascending aortic diameter: 50 mm (S). - Mitral valve: There was no significant regurgitation. - Right ventricle: Poorly visualized. The cavity size was normal.   Systolic function was normal. - Pulmonary arteries: No complete TR doppler jet so unable to   estimate PA systolic pressure. - Systemic veins: IVC was not visualized.  Impressions:  - Normal LV size with mild LV hypertrophy. EF 55-60%. Normal RV   size and systolic function. Trileaflet aortic valve with mild,   eccentric aortic insufficiency (entire jet may not have  been   visualized). The aortic root and ascending aorta were severely   dilated. Suggests CTA or MRA chest to better assess the thoracic   aorta.  ------------------------------------------------------------------- Labs, prior tests, procedures, and surgery: Transthoracic echocardiography (09/01/2015).    The aortic valve showed mild to moderate regurgitation.  EF was 50% and PA pressure was 32 (systolic).  ------------------------------------------------------------------- Study data:  Comparison was made to the study of 09/01/2015.  Study status:  Routine.  Procedure:  The patient reported no pain pre or post test. Transthoracic echocardiography. Image quality was suboptimal. The study was technically difficult, as a result of poor acoustic windows, poor sound wave transmission, and body habitus. Intravenous contrast (Definity) was administered to opacify the LV.  Study completion:  There were no complications.       Transthoracic echocardiography.  M-mode, complete 2D, spectral Doppler, and color Doppler.  Birthdate:  Patient birthdate: Jun 22, 1940.  Age:  Patient is 78 yr old.  Sex:  Gender: male.    BMI: 31.2 kg/m^2.  Blood pressure:     136/80  Patient status:  Outpatient.  Study date:  Study date: 12/16/2017. Study time: 10:30 AM.  Location:  [facility]  -------------------------------------------------------------------  ------------------------------------------------------------------- Left ventricle:  The cavity size was normal. Wall thickness was increased in a pattern of mild LVH. Systolic function was normal. The estimated ejection fraction was in the range of 55% to 60%. Wall motion was normal; there were no regional wall motion abnormalities. Doppler parameters are consistent with abnormal left ventricular relaxation (grade 1 diastolic dysfunction).  ------------------------------------------------------------------- Aortic valve:   Trileaflet.   Doppler:   There was no stenosis. Eccentric aortic insufficiency, appears mild on this study.  ------------------------------------------------------------------- Aorta:  Severely dilated aortic root and ascending aorta.  ------------------------------------------------------------------- Mitral valve:   Normal thickness leaflets .  Doppler:   There was no evidence for stenosis.   There was no significant regurgitation.    Valve area by pressure half-time: 2.78 cm^2. Indexed valve area by pressure half-time: 1.04 cm^2/m^2.  ------------------------------------------------------------------- Left atrium:  The atrium was normal in size.  ------------------------------------------------------------------- Right ventricle:  Poorly visualized. The cavity size was normal. Systolic function was normal.  ------------------------------------------------------------------- Pulmonic valve:    Structurally normal valve.   Cusp separation was normal.  Doppler:  Transvalvular velocity was within the normal range. There was no regurgitation.  ------------------------------------------------------------------- Tricuspid valve:   Doppler:  There was no significant regurgitation.    Aortic Size Index=     4.8   /Body surface area is 2.7 meters squared. = 1.8  < 2.75 cm/m2      4% risk per year 2.75 to 4.25          8% risk per year > 4.25 cm/m2    20% risk per year  cross sectional area of aorta cm2/height in meters > 10 consider  surgery 16.6/2= 8.3    Assessment / Plan:  Known dilated ascending aorta stable at 4.8 cm unchanged on review today, updated echocardiogram September 2019 shows mild aortic regurgitation-we will obtain follow-up CTA of the chest in 1 year    Patient was warned about not using Cipro and similar antibiotics. Recent studies have raised concern that fluoroquinolone antibiotics could be associated with an increased risk of aortic aneurysm Fluoroquinolones have  non-antimicrobial properties that might jeopardise the integrity of the extracellular matrix of the vascular wall In a  propensity score matched cohort study in [place], there was a 66% increased rate of aortic aneurysm or dissection associated with oral fluoroquinolone use, compared with amoxicillin use, within a 60 day risk period from start of treatment  Delight Ovens MD  Beeper [phone] Office [phone] 08/21/2018 2:40 PM

## 2019-02-13 ENCOUNTER — Telehealth: Payer: Self-pay | Admitting: Cardiovascular Disease

## 2019-02-13 NOTE — Telephone Encounter (Signed)
I spoke to the patient who has noticed within the last 2 weeks an increase in SOB with minimal exertion.  He denies CP, but does mention fluctuating HR and BP.  I scheduled an OV with Vin on 11/10 @ 10:30 for further evaluation.

## 2019-02-13 NOTE — Telephone Encounter (Signed)
Pt c/o BP issue: STAT if pt c/o blurred vision, one-sided weakness or slurred speech  1. What are your last 5 BP readings?  10/24: 113/71 HR 82 10/30: 123/82 HR 84  10/31: 126/85 HR 64  11/01: 127/87 HR 82 11/02: 111/81 HR 57 11/05: 145/94 HR 95 11/05: 107/81 HR 64 after resting  2. Are you having any other symptoms (ex. Dizziness, headache, blurred vision, passed out)? SOB on exertion, a little sweaty when his BP and HR were high yesterday. He also reports that the temperature is elevated in his house because his wife is cold   3. What is your BP issue? Pt is concerned with how his BP fluctuates with activity versus at rest.    Pt c/o Shortness Of Breath: STAT if SOB developed within the last 24 hours or pt is noticeably SOB on the phone  1. Are you currently SOB (can you hear that pt is SOB on the phone)? no  2. How long have you been experiencing SOB? ~2 weeks  3. Are you SOB when sitting or when up moving around? After activity  4. Are you currently experiencing any other symptoms? Fluctuating BP   The patient notes that the last time this happened, he had a stent put in, but it has been ~16 years. He has not been exercising as much, [facility]. He called Dr. Joylene Draft, his PCP and he was told to contact Dr. Angelena Form and possibly go to the ER.  He does not want to go to the ER if he doesn't have to

## 2019-02-16 NOTE — Progress Notes (Signed)
Cardiology Office Note    Date:  02/17/2019   ID:  Ronald [name] 07-01-1940, MRN [id number]  PCP:  [name], MD  Cardiologist: Dr. Clifton Colon  Chief Complaint: SOB  History of Present Illness:   Ronald [name] is a 78 y.o. male with h/o CAD s/p stent RCA 2004, HTN, HLD, thoracic aortic aneurysm (followed by Dr. Tyrone Sage) seen for SOB.   Last cardiac cath in 2013 with mild disease in the LAD and Circumflex, patent stent proximal RCA and moderate disease in the small PDA. He has not tolerated statins due to muscle aches or beta blockers secondary to bradycardia. Nuclear stress test 08/16/15 with no ischemia. Echo May 2017 with normal LV systolic function, mild to moderate AI. He was doing well on cardiac stand point when last seen by Dr. Clifton Marck 11/2017.  Chest CTA 08/2018  with stable dilated ascending aorta.   For the past 3 weeks he has exertional shortness of breath without chest tightness.  Some days are good and some days are bad.  Heart rate was never above 100.  Denies orthopnea, PND, syncope, dizziness, palpitation, melena or blood in his stool or urine.  No recent travel of sickness.  Does drink caffeinated soda.  Past Medical History:  Diagnosis Date  . Arthritis   . Coronary artery disease    Stent 2004  . GERD (gastroesophageal reflux disease)   . History of echocardiogram    Echo 5/17: mild LVH, EF 50-55%, no RWMA, Gr 2 DD, mild to mod AI, PASP 32 mmHg  . History of nuclear stress test    Myoview 5/17 - EF 48%, no scar, no ischemia. Low Risk  . History of pneumonia    78 years old  . Hx of Bell's palsy    left face  . Hypercholesteremia    managed by dr Waynard Edwards  . Hypertension   . 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   . Lung nodules    noted on past CT scans  . Malaria   . SVT (supraventricular tachycardia) (HCC)    remote episode during exercise test  .  Thoracic aortic aneurysm [facility])    last scan in Jan 2013. Now measuring 5.0cm. Referred to Dr. Tyrone Sage  . Wears dentures    bottom  . Wears glasses     Past Surgical History:  Procedure Laterality Date  . CERVICAL FUSION  03-2010   c2-c7  . COLONOSCOPY     x4  . CORONARY STENT PLACEMENT  2004  . dental implant     x 2  . ELBOW SURGERY  1997   lt and rt  . EYE SURGERY    . HIP PINNING,CANNULATED Right 05/11/2014   Procedure: CANNULATED HIP PINNING;  Surgeon: Sheral Apley, MD;  Location: [facility] OR;  Service: Orthopedics;  Laterality: Right;  . RETINAL DETACHMENT SURGERY  1975   lt  . rotator cuff surgery  1997   lt  . SHOULDER ARTHROSCOPY  2/15   right-got cardiac clearance-gsc  . TONSILLECTOMY    . TRIGGER FINGER RELEASE Left 10/12/2013   Procedure: LEFT LONG FINGER TRIGGER RELEASE;  Surgeon: Jodi Marble, MD;  Location: [facility];  Service: Orthopedics;  Laterality: Left;    Current Medications:  Prior to Admission medications   Medication Sig Start Date End Date Taking? Authorizing Provider  aspirin EC 81 MG tablet Take 1 tablet (81 mg total) by mouth daily. 10/19/16  Yes Kathleene Hazel, MD  chlorthalidone (HYGROTON) 25 MG tablet Take 12.5 mg by mouth. TAKE ONE HALF TABLET DAILY   Yes [provider]  Coenzyme Q10 (COQ-10) 200 MG CAPS Take 200 mg by mouth daily after supper.    Yes [provider]  colesevelam (WELCHOL) 625 MG tablet Take 3,750 mg by mouth daily after supper. 6 tablets   Yes [provider]  ezetimibe (ZETIA) 10 MG tablet Take 5 mg by mouth daily. Takes half tab of    Yes [provider]  Glucosamine-Chondroit-Vit C-Mn (GLUCOSAMINE CHONDR 1500 COMPLX) CAPS Take 2 capsules by mouth daily after supper.    Yes [provider]  hydrocortisone 2.5 % cream Apply 1 application topically as needed (RASH). Use on head 07/09/14  Yes [provider]  lisinopril (PRINIVIL,ZESTRIL) 40 MG  tablet Take 40 mg by mouth daily after supper.    Yes [provider]  Multiple Vitamin (MULTIVITAMIN WITH MINERALS) TABS tablet Take 1 tablet by mouth daily after supper.   Yes [provider]  mupirocin ointment (BACTROBAN) 2 %  09/24/18  Yes [provider]  nitroGLYCERIN (NITROSTAT) 0.4 MG SL tablet Place 1 tablet (0.4 mg total) under the tongue every 5 (five) minutes as needed for chest pain. 08/02/15  Yes Weaver, Scott T, PA-C  omeprazole (PRILOSEC) 20 MG capsule Take 20 mg by mouth daily after supper.    Yes [provider]     Allergies:   Promethazine hcl, Statins, Vesicare [solifenacin succinate], and Quinolones   Social History   Socioeconomic History  . Marital status: Married    Spouse name: Not on file  . Number of children: 1  . Years of education: Not on file  . Highest education level: Not on file  Occupational History  . Occupation: retired Advertising account executive: RETIRED  Social Needs  . Financial resource strain: Not on file  . Food insecurity    Worry: Not on file    Inability: Not on file  . Transportation needs    Medical: Not on file    Non-medical: Not on file  Tobacco Use  . Smoking status: Former Smoker    Quit date: 04/09/1969    Years since quitting: 49.8  . Smokeless tobacco: Current User    Types: Chew  . Tobacco comment: reports that when he smoked it was intermittent  Substance and Sexual Activity  . Alcohol use: Yes    Alcohol/week: 1.0 standard drinks    Types: 1 Standard drinks or equivalent per week    Comment: occasional mainly during holiday  . Drug use: No  . Sexual activity: Yes  Lifestyle  . Physical activity    Days per week: Not on file    Minutes per session: Not on file  . Stress: Not on file  Relationships  . Social Musician on phone: Not on file    Gets together: Not on file    Attends religious service: Not on file    Active member of club or organization: Not on file     Attends meetings of clubs or organizations: Not on file    Relationship status: Not on file  Other Topics Concern  . Not on file  Social History Narrative  . Not on file     Family History:  The patient's family history includes Cancer (  age of onset: 6989) in his mother; Heart attack in his father; Heart disease in his father.   ROS:   Please see the history of present illness.    ROS All other systems reviewed and are negative.   PHYSICAL EXAM:   VS:  BP 122/84   Pulse 87   Ht 6\' 7"  (2.007 m)   Wt 287 lb (130.2 kg)   SpO2 97%   BMI 32.33 kg/m    GEN: Well nourished, well developed, in no acute distress  HEENT: normal  Neck: no JVD, carotid bruits, or masses Cardiac: Irregularly irregular; no murmurs, rubs, or gallops,no edema  Respiratory:  clear to auscultation bilaterally, normal work of breathing GI: soft, nontender, nondistended, + BS MS: no deformity or atrophy  Skin: warm and dry, no rash Neuro:  Alert and Oriented x 3, Strength and sensation are intact Psych: euthymic mood, full affect  Wt Readings from Last 3 Encounters:  02/17/19 287 lb (130.2 kg)  08/21/18 288 lb (130.6 kg)  11/15/17 276 lb 12.8 oz (125.6 kg)      Studies/Labs Reviewed:   EKG:  EKG is ordered today.  The ekg ordered today demonstrates atrial fibrillation at rate of 87 bpm  Recent Labs: No results found for requested labs within last 8760 hours.   Lipid Panel    Component Value Date/Time   CHOL 184 11/14/2010 1221   TRIG 143.0 11/14/2010 1221   HDL 48.60 11/14/2010 1221   CHOLHDL 4 11/14/2010 1221   VLDL 28.6 11/14/2010 1221   LDLCALC 107 (H) 11/14/2010 1221    Additional studies/ records that were reviewed today include:   As above    ASSESSMENT & PLAN:   1.  New onset atrial fibrillation -Rate controlled.  He has baseline bradycardia while in sinus rhythm.  Now heart rate in 80s to 90s, never above 100.  Unknown etiology.  Seems he went into atrial fibrillation 3 weeks  ago when started to notice exertional shortness of breath without chest tightness or heaviness.  Symptoms different than prior angina.  No syncope, palpitation or dizziness.  Get echocardiogram and check TSH. CHADSVASC score of 3 for age and HTN. Start Eliquis 5mg  BID. Will plan cardioversion after 3 weeks of anticoagulation.   2. CAD - No angina. Reduce asa to 81mg  daily due to need of anticoagulation. Continue statin.   3. HTN - BP stable on current medications   4. HLD - Statin intolerance - Continue zetia  5. Thoracic aortic aneurysm (followed by Dr. Tyrone SageGerhardt)  - Stable by last study 08/2018   Medication Adjustments/Labs and Tests Ordered: Current medicines are reviewed at length with the patient today.  Concerns regarding medicines are outlined above.  Medication changes, Labs and Tests ordered today are listed in the Patient Instructions below. Patient Instructions  Medication Instructions:  Your physician has recommended you make the following change in your medication:  1.  REDUCE the Aspirin to 81 mg daily 2.  START Eliquis 5 mg taking 1 tablet twice a day  *If you need a refill on your cardiac medications before your next appointment, please call your pharmacy*  Lab Work: TODAY:  TSH  If you have labs (blood work) drawn today and your tests are completely normal, you will receive your results only by: Marland Kitchen. MyChart Message (if you have MyChart) OR . A paper copy in the mail If you have any lab test that is abnormal or we need to change your treatment, we will call  you to review the results.  Testing/Procedures: Your physician has requested that you have an echocardiogram 02/26/2019 ARRIVE AT  9:20 FOR REGISTRATION.  Echocardiography is a painless test that uses sound waves to create images of your heart. It provides your doctor with information about the size and shape of your heart and how well your heart's chambers and valves are working. This procedure takes approximately  one hour. There are no restrictions for this procedure.    Follow-Up: At [facility], you and your health needs are our priority.  As part of our continuing mission to provide you with exceptional heart care, we have created designated Provider Care Teams.  These Care Teams include your primary Cardiologist (physician) and Advanced Practice Providers (APPs -  Physician Assistants and Nurse Practitioners) who all work together to provide you with the care you need, when you need it.  Your next appointment:   02/18/23/2020  ARRIVE AT 1:45   The format for your next appointment:   In Person  Provider:   Robbie Lis, PA-C  Other Instructions   Echocardiogram An echocardiogram is a procedure that uses painless sound waves (ultrasound) to produce an image of the heart. Images from an echocardiogram can provide important information about:  Signs of coronary artery disease (CAD).  Aneurysm detection. An aneurysm is a weak or damaged part of an artery wall that bulges out from the normal force of blood pumping through the body.  Heart size and shape. Changes in the size or shape of the heart can be associated with certain conditions, including heart failure, aneurysm, and CAD.  Heart muscle function.  Heart valve function.  Signs of a past heart attack.  Fluid buildup around the heart.  Thickening of the heart muscle.  A tumor or infectious growth around the heart valves. Tell a health care provider about:  Any allergies you have.  All medicines you are taking, including vitamins, herbs, eye drops, creams, and over-the-counter medicines.  Any blood disorders you have.  Any surgeries you have had.  Any medical conditions you have.  Whether you are pregnant or may be pregnant. What are the risks? Generally, this is a safe procedure. However, problems may occur, including:  Allergic reaction to dye (contrast) that may be used during the procedure. What happens before  the procedure? No specific preparation is needed. You may eat and drink normally. What happens during the procedure?   An IV tube may be inserted into one of your veins.  You may receive contrast through this tube. A contrast is an injection that improves the quality of the pictures from your heart.  A gel will be applied to your chest.  A wand-like tool (transducer) will be moved over your chest. The gel will help to transmit the sound waves from the transducer.  The sound waves will harmlessly bounce off of your heart to allow the heart images to be captured in real-time motion. The images will be recorded on a computer. The procedure may vary among health care providers and hospitals. What happens after the procedure?  You may return to your normal, everyday life, including diet, activities, and medicines, unless your health care provider tells you not to do that. Summary  An echocardiogram is a procedure that uses painless sound waves (ultrasound) to produce an image of the heart.  Images from an echocardiogram can provide important information about the size and shape of your heart, heart muscle function, heart valve function, and fluid buildup around your  heart.  You do not need to do anything to prepare before this procedure. You may eat and drink normally.  After the echocardiogram is completed, you may return to your normal, everyday life, unless your health care provider tells you not to do that. This information is not intended to replace advice given to you by your health care provider. Make sure you discuss any questions you have with your health care provider. Document Released: 03/23/2000 Document Revised: 07/17/2018 Document Reviewed: 04/28/2016 Elsevier Patient Education  [address]     Vonzella Nipple [place], [place]  02/17/2019 11:45 AM    [facility] [address] [place], [place], [place]  [address] Phone: [phone]; Fax: [phone]

## 2019-02-17 ENCOUNTER — Other Ambulatory Visit: Payer: Self-pay

## 2019-02-17 ENCOUNTER — Encounter: Payer: Self-pay | Admitting: Physician Assistant

## 2019-02-17 ENCOUNTER — Ambulatory Visit: Payer: Medicare Other | Admitting: Physician Assistant

## 2019-02-17 VITALS — BP 122/84 | HR 87 | Ht 79.0 in | Wt 287.0 lb

## 2019-02-17 DIAGNOSIS — I712 Thoracic aortic aneurysm, without rupture, unspecified: Secondary | ICD-10-CM

## 2019-02-17 DIAGNOSIS — I4891 Unspecified atrial fibrillation: Secondary | ICD-10-CM

## 2019-02-17 DIAGNOSIS — I10 Essential (primary) hypertension: Secondary | ICD-10-CM

## 2019-02-17 DIAGNOSIS — I2510 Atherosclerotic heart disease of native coronary artery without angina pectoris: Secondary | ICD-10-CM | POA: Diagnosis not present

## 2019-02-17 DIAGNOSIS — I351 Nonrheumatic aortic (valve) insufficiency: Secondary | ICD-10-CM

## 2019-02-17 DIAGNOSIS — E7800 Pure hypercholesterolemia, unspecified: Secondary | ICD-10-CM | POA: Diagnosis not present

## 2019-02-17 LAB — TSH: TSH: 2.14 u[IU]/mL (ref 0.450–4.500)

## 2019-02-17 MED ORDER — APIXABAN 5 MG PO TABS: 5.0000 mg | ORAL_TABLET | Freq: Two times a day (BID) | ORAL | 11 refills | Status: DC

## 2019-02-17 MED ORDER — ASPIRIN EC 81 MG PO TBEC: 81.0000 mg | DELAYED_RELEASE_TABLET | Freq: Every day | ORAL | 3 refills | Status: DC

## 2019-02-17 NOTE — Patient Instructions (Addendum)
Medication Instructions:  Your physician has recommended you make the following change in your medication:  1.  REDUCE the Aspirin to 81 mg daily 2.  START Eliquis 5 mg taking 1 tablet twice a day  *If you need a refill on your cardiac medications before your next appointment, please call your pharmacy*  Lab Work: TODAY:  TSH  If you have labs (blood work) drawn today and your tests are completely normal, you will receive your results only by: Marland Kitchen MyChart Message (if you have MyChart) OR . A paper copy in the mail If you have any lab test that is abnormal or we need to change your treatment, we will call you to review the results.  Testing/Procedures: Your physician has requested that you have an echocardiogram 02/26/2019 ARRIVE AT  9:20 FOR REGISTRATION.  Echocardiography is a painless test that uses sound waves to create images of your heart. It provides your doctor with information about the size and shape of your heart and how well your heart's chambers and valves are working. This procedure takes approximately one hour. There are no restrictions for this procedure.    Follow-Up: At [facility], you and your health needs are our priority.  As part of our continuing mission to provide you with exceptional heart care, we have created designated Provider Care Teams.  These Care Teams include your primary Cardiologist (physician) and Advanced Practice Providers (APPs -  Physician Assistants and Nurse Practitioners) who all work together to provide you with the care you need, when you need it.  Your next appointment:   02/18/23/2020  ARRIVE AT 1:45   The format for your next appointment:   In Person  Provider:   Robbie Lis, PA-C  Other Instructions   Echocardiogram An echocardiogram is a procedure that uses painless sound waves (ultrasound) to produce an image of the heart. Images from an echocardiogram can provide important information about:  Signs of coronary artery disease  (CAD).  Aneurysm detection. An aneurysm is a weak or damaged part of an artery wall that bulges out from the normal force of blood pumping through the body.  Heart size and shape. Changes in the size or shape of the heart can be associated with certain conditions, including heart failure, aneurysm, and CAD.  Heart muscle function.  Heart valve function.  Signs of a past heart attack.  Fluid buildup around the heart.  Thickening of the heart muscle.  A tumor or infectious growth around the heart valves. Tell a health care provider about:  Any allergies you have.  All medicines you are taking, including vitamins, herbs, eye drops, creams, and over-the-counter medicines.  Any blood disorders you have.  Any surgeries you have had.  Any medical conditions you have.  Whether you are pregnant or may be pregnant. What are the risks? Generally, this is a safe procedure. However, problems may occur, including:  Allergic reaction to dye (contrast) that may be used during the procedure. What happens before the procedure? No specific preparation is needed. You may eat and drink normally. What happens during the procedure?   An IV tube may be inserted into one of your veins.  You may receive contrast through this tube. A contrast is an injection that improves the quality of the pictures from your heart.  A gel will be applied to your chest.  A wand-like tool (transducer) will be moved over your chest. The gel will help to transmit the sound waves from the transducer.  The  sound waves will harmlessly bounce off of your heart to allow the heart images to be captured in real-time motion. The images will be recorded on a computer. The procedure may vary among health care providers and hospitals. What happens after the procedure?  You may return to your normal, everyday life, including diet, activities, and medicines, unless your health care provider tells you not to do  that. Summary  An echocardiogram is a procedure that uses painless sound waves (ultrasound) to produce an image of the heart.  Images from an echocardiogram can provide important information about the size and shape of your heart, heart muscle function, heart valve function, and fluid buildup around your heart.  You do not need to do anything to prepare before this procedure. You may eat and drink normally.  After the echocardiogram is completed, you may return to your normal, everyday life, unless your health care provider tells you not to do that. This information is not intended to replace advice given to you by your health care provider. Make sure you discuss any questions you have with your health care provider. Document Released: 03/23/2000 Document Revised: 07/17/2018 Document Reviewed: 04/28/2016 Elsevier Patient Education  2020 [employer].

## 2019-02-23 ENCOUNTER — Other Ambulatory Visit (HOSPITAL_COMMUNITY): Payer: Medicare Other

## 2019-02-26 ENCOUNTER — Ambulatory Visit (HOSPITAL_COMMUNITY): Payer: Medicare Other | Attending: Cardiology

## 2019-02-26 ENCOUNTER — Other Ambulatory Visit: Payer: Self-pay

## 2019-02-26 DIAGNOSIS — I10 Essential (primary) hypertension: Secondary | ICD-10-CM | POA: Insufficient documentation

## 2019-02-26 DIAGNOSIS — I2510 Atherosclerotic heart disease of native coronary artery without angina pectoris: Secondary | ICD-10-CM | POA: Insufficient documentation

## 2019-02-26 DIAGNOSIS — I712 Thoracic aortic aneurysm, without rupture, unspecified: Secondary | ICD-10-CM

## 2019-02-26 DIAGNOSIS — I351 Nonrheumatic aortic (valve) insufficiency: Secondary | ICD-10-CM | POA: Diagnosis present

## 2019-02-26 DIAGNOSIS — E7800 Pure hypercholesterolemia, unspecified: Secondary | ICD-10-CM | POA: Diagnosis present

## 2019-02-26 MED ORDER — PERFLUTREN LIPID MICROSPHERE
1.0000 mL | INTRAVENOUS | Status: AC | PRN
  Administered 2019-02-26: 2 mL via INTRAVENOUS

## 2019-03-03 ENCOUNTER — Other Ambulatory Visit: Payer: Self-pay

## 2019-03-03 ENCOUNTER — Encounter: Payer: Self-pay | Admitting: Physician Assistant

## 2019-03-03 ENCOUNTER — Ambulatory Visit: Payer: Medicare Other | Admitting: Physician Assistant

## 2019-03-03 VITALS — BP 118/80 | HR 75 | Ht 79.0 in | Wt 290.8 lb

## 2019-03-03 DIAGNOSIS — I712 Thoracic aortic aneurysm, without rupture, unspecified: Secondary | ICD-10-CM

## 2019-03-03 DIAGNOSIS — I2510 Atherosclerotic heart disease of native coronary artery without angina pectoris: Secondary | ICD-10-CM

## 2019-03-03 DIAGNOSIS — I10 Essential (primary) hypertension: Secondary | ICD-10-CM

## 2019-03-03 DIAGNOSIS — I4819 Other persistent atrial fibrillation: Secondary | ICD-10-CM | POA: Diagnosis not present

## 2019-03-03 DIAGNOSIS — I4891 Unspecified atrial fibrillation: Secondary | ICD-10-CM | POA: Diagnosis not present

## 2019-03-03 NOTE — H&P (View-Only) (Signed)
Cardiology Office Note    Date:  03/03/2019   ID:  Ronald, [name] 02-Jun-1940, MRN [id number]  PCP:  [name], MD  Cardiologist:  Dr. Clifton Tylerjames  Chief Complaint: 2 weeks follow up  History of Present Illness:   Ronald [name] is a 78 y.o. male with h/o CAD s/p stent RCA 2004, HTN, HLD, thoracic aortic aneurysm (followed by Dr. Tyrone Sage) and recent dx of afib seen for follow up.   Last cardiac cath in 2013 with mild disease in the LAD and Circumflex, patent stent proximal RCA and moderate disease in the small PDA. He has not tolerated statins due to muscle aches or beta blockers secondary to bradycardia. Nuclear stress test 08/16/15 with no ischemia. Echo May 2017with normal LV systolic function, mild to moderate AI. Chest CTA 08/2018 with stable dilated ascending aorta.   Seen by me 02/17/2019 for SOB and found to be in new onset afib at controlled rate. TSH normal started on Eliquis. Echo showed normal LVEf to 50-55%. No significant valvular abnormality. Moderate to severe dilatation of aortic root/ascending aorta to 5.2 cm I(dilatation of the ascending aorta 4.8 cm by chest CT on 08/2018).  Here today for follow up. Still in afib. He gets got and fatigue with activity. He can function at slow space. He has Aeronautical engineer business. No chest pain, palpitation, dizziness, syncope, Le edema, orthopnea, PND or melena. Compliant with Eliquis. Denies bleeding issue.    Past Medical History:  Diagnosis Date  . Arthritis   . Coronary artery disease    Stent 2004  . GERD (gastroesophageal reflux disease)   . History of echocardiogram    Echo 5/17: mild LVH, EF 50-55%, no RWMA, Gr 2 DD, mild to mod AI, PASP 32 mmHg  . History of nuclear stress test    Myoview 5/17 - EF 48%, no scar, no ischemia. Low Risk  . History of pneumonia    78 years old  . Hx of Bell's palsy    left face  . Hypercholesteremia    managed by dr Waynard Edwards  . Hypertension   . 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   . Lung nodules    noted on past CT scans  . Malaria   . SVT (supraventricular tachycardia) (HCC)    remote episode during exercise test  . Thoracic aortic aneurysm [facility])    last scan in Jan 2013. Now measuring 5.0cm. Referred to Dr. Tyrone Sage  . Wears dentures    bottom  . Wears glasses     Past Surgical History:  Procedure Laterality Date  . CERVICAL FUSION  03-2010   c2-c7  . COLONOSCOPY     x4  . CORONARY STENT PLACEMENT  2004  . dental implant     x 2  . ELBOW SURGERY  1997   lt and rt  . EYE SURGERY    . HIP PINNING,CANNULATED Right 05/11/2014   Procedure: CANNULATED HIP PINNING;  Surgeon: Sheral Apley, MD;  Location[facility] OR;  Service: Orthopedics;  Laterality: Right;  . RETINAL DETACHMENT SURGERY  1975   lt  . rotator cuff surgery  1997   lt  . SHOULDER ARTHROSCOPY  2/15   right-got cardiac clearance-gsc  . TONSILLECTOMY    . TRIGGER FINGER RELEASE Left 10/12/2013   Procedure: LEFT LONG FINGER TRIGGER RELEASE;  Surgeon: Cliffton Asters  Janee Morn, MD;  Location: [facility];  Service: Orthopedics;  Laterality: Left;    Current Medications: Prior to Admission medications   Medication Sig Start Date End Date Taking? Authorizing Provider  apixaban (ELIQUIS) 5 MG TABS tablet Take 1 tablet (5 mg total) by mouth 2 (two) times daily. 02/17/19   Manson Passey, PA  aspirin EC 81 MG tablet Take 1 tablet (81 mg total) by mouth daily. 02/17/19   Zacharey Jensen, Sharrell Ku, PA  chlorthalidone (HYGROTON) 25 MG tablet Take 12.5 mg by mouth. TAKE ONE HALF TABLET DAILY    [provider]  Coenzyme Q10 (COQ-10) 200 MG CAPS Take 200 mg by mouth daily after supper.     [provider]  colesevelam (WELCHOL) 625 MG tablet Take 3,750 mg by mouth daily after supper. 6 tablets    [provider]  ezetimibe (ZETIA) 10 MG tablet Take 5 mg by mouth daily. Takes  half tab of     [provider]  Glucosamine-Chondroit-Vit C-Mn (GLUCOSAMINE CHONDR 1500 COMPLX) CAPS Take 2 capsules by mouth daily after supper.     [provider]  hydrocortisone 2.5 % cream Apply 1 application topically as needed (RASH). Use on head 07/09/14   [provider]  lisinopril (PRINIVIL,ZESTRIL) 40 MG tablet Take 40 mg by mouth daily after supper.     [provider]  Multiple Vitamin (MULTIVITAMIN WITH MINERALS) TABS tablet Take 1 tablet by mouth daily after supper.    [provider]  mupirocin ointment (BACTROBAN) 2 %  09/24/18   [provider]  nitroGLYCERIN (NITROSTAT) 0.4 MG SL tablet Place 1 tablet (0.4 mg total) under the tongue every 5 (five) minutes as needed for chest pain. 08/02/15   Tereso Newcomer T, PA-C  omeprazole (PRILOSEC) 20 MG capsule Take 20 mg by mouth daily after supper.     [provider]    Allergies:   Promethazine hcl, Statins, Vesicare [solifenacin succinate], and Quinolones   Social History   Socioeconomic History  . Marital status: Married    Spouse name: Not on file  . Number of children: 1  . Years of education: Not on file  . Highest education level: Not on file  Occupational History  . Occupation: retired Advertising account executive: RETIRED  Social Needs  . Financial resource strain: Not on file  . Food insecurity    Worry: Not on file    Inability: Not on file  . Transportation needs    Medical: Not on file    Non-medical: Not on file  Tobacco Use  . Smoking status: Former Smoker    Quit date: 04/09/1969    Years since quitting: 49.9  . Smokeless tobacco: Current User    Types: Chew  . Tobacco comment: reports that when he smoked it was intermittent  Substance and Sexual Activity  . Alcohol use: Yes    Alcohol/week: 1.0 standard drinks    Types: 1 Standard drinks or equivalent per week    Comment: occasional mainly during holiday  . Drug use: No  . Sexual  activity: Yes  Lifestyle  . Physical activity    Days per week: Not on file    Minutes per session: Not on file  . Stress: Not on file  Relationships  . Social Musician on phone: Not on file    Gets together: Not on file    Attends religious service: Not on file    Active member  of club or organization: Not on file    Attends meetings of clubs or organizations: Not on file    Relationship status: Not on file  Other Topics Concern  . Not on file  Social History Narrative  . Not on file     Family History:  The patient's family history includes Cancer (age of onset: 44) in his mother; Heart attack in his father; Heart disease in his father.   ROS:   Please see the history of present illness.    ROS All other systems reviewed and are negative.   PHYSICAL EXAM:   VS:  BP 118/80   Pulse 75   Ht 6\' 7"  (2.007 m)   Wt 290 lb 12.8 oz (131.9 kg)   SpO2 99%   BMI 32.76 kg/m    GEN: Well nourished, well developed, in no acute distress  HEENT: normal  Neck: no JVD, carotid bruits, or masses Cardiac: Ir Ir ; no murmurs, rubs, or gallops,no edema  Respiratory:  clear to auscultation bilaterally, normal work of breathing GI: soft, nontender, nondistended, + BS MS: no deformity or atrophy  Skin: warm and dry, no rash Neuro:  Alert and Oriented x 3, Strength and sensation are intact Psych: euthymic mood, full affect  Wt Readings from Last 3 Encounters:  03/03/19 290 lb 12.8 oz (131.9 kg)  02/17/19 287 lb (130.2 kg)  08/21/18 288 lb (130.6 kg)      Studies/Labs Reviewed:   EKG:  EKG is ordered today.  The ekg ordered today demonstrates atrial fibrillation at rate of 75 bpm  Recent Labs: 02/17/2019: TSH 2.140   Lipid Panel    Component Value Date/Time   CHOL 184 11/14/2010 1221   TRIG 143.0 11/14/2010 1221   HDL 48.60 11/14/2010 1221   CHOLHDL 4 11/14/2010 1221   VLDL 28.6 11/14/2010 1221   LDLCALC 107 (H) 11/14/2010 1221    Additional studies/ records  that were reviewed today include:   Echocardiogram: 02/2019 1. Left ventricular ejection fraction, by visual estimation, is 50 to 55%. The left ventricle has normal function. There is moderately increased left ventricular hypertrophy.  2. Definity contrast agent was given IV to delineate the left ventricular endocardial borders.  3. Left ventricular diastolic function could not be evaluated.  4. Global right ventricle has normal systolic function.The right ventricular size is normal.  5. Left atrial size was normal.  6. Right atrial size was normal.  7. The mitral valve is normal in structure. Trace mitral valve regurgitation. No evidence of mitral stenosis.  8. The tricuspid valve is normal in structure. Tricuspid valve regurgitation is trivial.  9. The aortic valve is tricuspid. Aortic valve regurgitation is mild. Mild aortic valve sclerosis without stenosis. 10. The pulmonic valve was not well visualized. Pulmonic valve regurgitation is not visualized. 11. Aortic dilatation noted. 12. 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 13. The inferior vena cava is normal in size with greater than 50% respiratory variability, suggesting right atrial pressure of 3 mmHg. 14. Definity used; normal LV systolic function; moderate LVH; moderate to severe dilatation of aortic root/ascending aorta (5.2 cm; suggest CTA or MRA to further assess); mild AI.  ASSESSMENT & PLAN:   1. Persistent atrial fibrillation - LVEF 50-55%. Symptomatic with activity. 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 Rate controlled. Compliant with Eliquis. Will schedule cardioversion. Risk and benefits discussed with patient and he is willing to proceed.  2.  Thoracic aortic aneurysm - Moderate to severe dilatation of aortic root/ascending aorta to 5.2 cm by echo. (dilatation of the ascending aorta 4.8 cm by chest CT on 08/2018).  I have forwardsed result to Dr.  Tyrone SageGerhardt and Dr. Clifton JamesMcAlhany to see if need to do CT prior to schedule date in May 2021.  3. CAD - No angina. Continue ASA and Zetia.   4. HTN - BP stable on current medications.   5. HLD - No Zetia. Statin intolerance.    Medication Adjustments/Labs and Tests Ordered: Current medicines are reviewed at length with the patient today.  Concerns regarding medicines are outlined above.  Medication changes, Labs and Tests ordered today are listed in the Patient Instructions below. Patient Instructions  Medication Instructions:   *If you need a refill on your cardiac medications before your next appointment, please call your pharmacy*  Lab Work: Your physician recommends that you have lab work today- BMET and CBC  If you have labs (blood work) drawn today and your tests are completely normal, you will receive your results only by: Marland Kitchen. MyChart Message (if you have MyChart) OR . A paper copy in the mail If you have any lab test that is abnormal or we need to change your treatment, we will call you to review the results.  Your Pre-procedure COVID-19 Testing will be done on 03/10/19 at [facility] at [address], [place], [place] [id number]. Once you arrive at the testing site, stay in the right hand lane, go under the building overhang not the tent. If you are tested under the tent your results may not be back before your procedure. Please be on time for your appointment.  After your swab you will be given a mask to wear and instructed to go home and quarantine/no visitors until after your procedure. If you test positive you will be notified and your procedure will be cancelled.   Testing/Procedures: Your physician has recommended that you have a Cardioversion (DCCV). Electrical Cardioversion uses a jolt of electricity to your heart either through paddles or wired patches attached to your chest. This is a controlled, usually prescheduled, procedure. Defibrillation is  done under light anesthesia in the hospital, and you usually go home the day of the procedure. This is done to get your heart back into a normal rhythm. You are not awake for the procedure. Please see the instruction sheet given to you today.  Follow-Up: At [facility], you and your health needs are our priority.  As part of our continuing mission to provide you with exceptional heart care, we have created designated Provider Care Teams.  These Care Teams include your primary Cardiologist (physician) and Advanced Practice Providers (APPs -  Physician Assistants and Nurse Practitioners) who all work together to provide you with the care you need, when you need it.  Your next appointment:   2 week(s) 03/27/19 at 2:30 pm  The format for your next appointment:   In Person  Provider:    You may see [address]Vin Cielle Aguila PA        Signed, Manson PasseyBhavinkumar Tekeshia Klahr, [place]  03/03/2019 2:59 PM    [facility] [address], [place], [place]  [address] Phone: [phone]; Fax: [phone]

## 2019-03-03 NOTE — Patient Instructions (Addendum)
Medication Instructions:   *If you need a refill on your cardiac medications before your next appointment, please call your pharmacy*  Lab Work: Your physician recommends that you have lab work today- BMET and CBC  If you have labs (blood work) drawn today and your tests are completely normal, you will receive your results only by: Marland Kitchen MyChart Message (if you have MyChart) OR . A paper copy in the mail If you have any lab test that is abnormal or we need to change your treatment, we will call you to review the results.  Your Pre-procedure COVID-19 Testing will be done on 03/10/19 at [facility] at [address], [place], [place] [address]. Once you arrive at the testing site, stay in the right hand lane, go under the building overhang not the tent. If you are tested under the tent your results may not be back before your procedure. Please be on time for your appointment.  After your swab you will be given a mask to wear and instructed to go home and quarantine/no visitors until after your procedure. If you test positive you will be notified and your procedure will be cancelled.   Testing/Procedures: Your physician has recommended that you have a Cardioversion (DCCV). Electrical Cardioversion uses a jolt of electricity to your heart either through paddles or wired patches attached to your chest. This is a controlled, usually prescheduled, procedure. Defibrillation is done under light anesthesia in the hospital, and you usually go home the day of the procedure. This is done to get your heart back into a normal rhythm. You are not awake for the procedure. Please see the instruction sheet given to you today.  Follow-Up: At [facility], you and your health needs are our priority.  As part of our continuing mission to provide you with exceptional heart care, we have created designated Provider Care Teams.  These Care Teams include your primary Cardiologist (physician)  and Advanced Practice Providers (APPs -  Physician Assistants and Nurse Practitioners) who all work together to provide you with the care you need, when you need it.  Your next appointment:   2 week(s) 03/27/19 at 2:30 pm  The format for your next appointment:   In Person  Provider:    You may see Robbie Lis PA

## 2019-03-03 NOTE — Progress Notes (Signed)
Cardiology Office Note    Date:  03/03/2019   ID:  Ronald [name], Ronald [name] 02-Jun-1940, MRN [id number]  PCP:  Ronald Ran, MD  Cardiologist:  Dr. Clifton [name]  Chief Complaint: 2 weeks follow up  History of Present Illness:   Ronald [name] is a 78 y.o. male with h/o CAD s/p stent RCA 2004, HTN, HLD, thoracic aortic aneurysm (followed by Dr. [name]) and recent dx of afib seen for follow up.   Last cardiac cath in 2013 with mild disease in the LAD and Circumflex, patent stent proximal RCA and moderate disease in the small PDA. He has not tolerated statins due to muscle aches or beta blockers secondary to bradycardia. Nuclear stress test 08/16/15 with no ischemia. Echo May 2017with normal LV systolic function, mild to moderate AI. Chest CTA 08/2018 with stable dilated ascending aorta.   Seen by me 02/17/2019 for SOB and found to be in new onset afib at controlled rate. TSH normal started on Eliquis. Echo showed normal LVEf to 50-55%. No significant valvular abnormality. Moderate to severe dilatation of aortic root/ascending aorta to 5.2 cm I(dilatation of the ascending aorta 4.8 cm by chest CT on 08/2018).  Here today for follow up. Still in afib. He gets got and fatigue with activity. He can function at slow space. He has Aeronautical engineer business. No chest pain, palpitation, dizziness, syncope, Le edema, orthopnea, PND or melena. Compliant with Eliquis. Denies bleeding issue.    Past Medical History:  Diagnosis Date  . Arthritis   . Coronary artery disease    Stent 2004  . GERD (gastroesophageal reflux disease)   . History of echocardiogram    Echo 5/17: mild LVH, EF 50-55%, no RWMA, Gr 2 DD, mild to mod AI, PASP 32 mmHg  . History of nuclear stress test    Myoview 5/17 - EF 48%, no scar, no ischemia. Low Risk  . History of pneumonia    78 years old  . Hx of Bell's palsy    left face  . Hypercholesteremia    managed by dr [name]  . Hypertension   . 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   . Lung nodules    noted on past CT scans  . Malaria   . SVT (supraventricular tachycardia) (HCC)    remote episode during exercise test  . Thoracic aortic aneurysm [name])    last scan in Jan 2013. Now measuring 5.0cm. Referred to Dr. [name]  . Wears dentures    bottom  . Wears glasses     Past Surgical History:  Procedure Laterality Date  . CERVICAL FUSION  03-2010   c2-c7  . COLONOSCOPY     x4  . CORONARY STENT PLACEMENT  2004  . dental implant     x 2  . ELBOW SURGERY  1997   lt and rt  . EYE SURGERY    . HIP PINNING,CANNULATED Right 05/11/2014   Procedure: CANNULATED HIP PINNING;  Surgeon: Ronald Apley, MD;  [name];  Service: Orthopedics;  Laterality: Right;  . RETINAL DETACHMENT SURGERY  1975   lt  . rotator cuff surgery  1997   lt  . SHOULDER ARTHROSCOPY  2/15   right-got cardiac clearance-gsc  . TONSILLECTOMY    . TRIGGER FINGER RELEASE Left 10/12/2013   Procedure: LEFT LONG FINGER TRIGGER RELEASE;  Surgeon: Ronald Asters  Thompson, MD;  Location: [name];  Service: Orthopedics;  Laterality: Left;    Current Medications: Prior to Admission medications   Medication Sig Start Date End Date Taking? Authorizing Provider  apixaban (ELIQUIS) 5 MG TABS tablet Take 1 tablet (5 mg total) by mouth 2 (two) times daily. 02/17/19   Ronald Warr, PA  aspirin EC 81 MG tablet Take 1 tablet (81 mg total) by mouth daily. 02/17/19   Ronald Avitia, PA  chlorthalidone (HYGROTON) 25 MG tablet Take 12.5 mg by mouth. TAKE ONE HALF TABLET DAILY    [provider]  Coenzyme Q10 (COQ-10) 200 MG CAPS Take 200 mg by mouth daily after supper.     [provider]  colesevelam (WELCHOL) 625 MG tablet Take 3,750 mg by mouth daily after supper. 6 tablets    [provider]  ezetimibe (ZETIA) 10 MG tablet Take 5 mg by mouth daily. Takes  half tab of 10mg    [provider]  Glucosamine-Chondroit-Vit C-Mn (GLUCOSAMINE CHONDR 1500 COMPLX) CAPS Take 2 capsules by mouth daily after supper.     [provider]  hydrocortisone 2.5 % cream Apply 1 application topically as needed (RASH). Use on head 07/09/14   [provider]  lisinopril (PRINIVIL,ZESTRIL) 40 MG tablet Take 40 mg by mouth daily after supper.     [provider]  Multiple Vitamin (MULTIVITAMIN WITH MINERALS) TABS tablet Take 1 tablet by mouth daily after supper.    [provider]  mupirocin ointment (BACTROBAN) 2 %  09/24/18   [provider]  nitroGLYCERIN (NITROSTAT) 0.4 MG SL tablet Place 1 tablet (0.4 mg total) under the tongue every 5 (five) minutes as needed for chest pain. 08/02/15   Ronald [name], Scott T, PA-C  omeprazole (PRILOSEC) 20 MG capsule Take 20 mg by mouth daily after supper.     [provider]    Allergies:   Promethazine hcl, Statins, Vesicare [solifenacin succinate], and Quinolones   Social History   Socioeconomic History  . Marital status: Married    Spouse name: Not on file  . Number of children: 1  . Years of education: Not on file  . Highest education level: Not on file  Occupational History  . Occupation: retired school teacher    Employer: RETIRED  Social Needs  . Financial resource strain: Not on file  . Food insecurity    Worry: Not on file    Inability: Not on file  . Transportation needs    Medical: Not on file    Non-medical: Not on file  Tobacco Use  . Smoking status: Former Smoker    Quit date: 04/09/1969    Years since quitting: 49.9  . Smokeless tobacco: Current User    Types: Chew  . Tobacco comment: reports that when he smoked it was intermittent  Substance and Sexual Activity  . Alcohol use: Yes    Alcohol/week: 1.0 standard drinks    Types: 1 Standard drinks or equivalent per week    Comment: occasional mainly during holiday  . Drug use: No  . Sexual  activity: Yes  Lifestyle  . Physical activity    Days per week: Not on file    Minutes per session: Not on file  . Stress: Not on file  Relationships  . Social connections    Talks on phone: Not on file    Gets together: Not on file    Attends religious service: Not on file    Active member   of club or organization: Not on file    Attends meetings of clubs or organizations: Not on file    Relationship status: Not on file  Other Topics Concern  . Not on file  Social History Narrative  . Not on file     Family History:  The patient's family history includes Cancer (age of onset: 44) in his mother; Heart attack in his father; Heart disease in his father.   ROS:   Please see the history of present illness.    ROS All other systems reviewed and are negative.   PHYSICAL EXAM:   VS:  BP 118/80   Pulse 75   Ht 6\' 7"  (2.007 m)   Wt 290 lb 12.8 oz (131.9 kg)   SpO2 99%   BMI 32.76 kg/m    GEN: Well nourished, well developed, in no acute distress  HEENT: normal  Neck: no JVD, carotid bruits, or masses Cardiac: Ir Ir ; no murmurs, rubs, or gallops,no edema  Respiratory:  clear to auscultation bilaterally, normal work of breathing GI: soft, nontender, nondistended, + BS MS: no deformity or atrophy  Skin: warm and dry, no rash Neuro:  Alert and Oriented x 3, Strength and sensation are intact Psych: euthymic mood, full affect  Wt Readings from Last 3 Encounters:  03/03/19 290 lb 12.8 oz (131.9 kg)  02/17/19 287 lb (130.2 kg)  08/21/18 288 lb (130.6 kg)      Studies/Labs Reviewed:   EKG:  EKG is ordered today.  The ekg ordered today demonstrates atrial fibrillation at rate of 75 bpm  Recent Labs: 02/17/2019: TSH 2.140   Lipid Panel    Component Value Date/Time   CHOL 184 11/14/2010 1221   TRIG 143.0 11/14/2010 1221   HDL 48.60 11/14/2010 1221   CHOLHDL 4 11/14/2010 1221   VLDL 28.6 11/14/2010 1221   LDLCALC 107 (H) 11/14/2010 1221    Additional studies/ records  that were reviewed today include:   Echocardiogram: 02/2019 1. Left ventricular ejection fraction, by visual estimation, is 50 to 55%. The left ventricle has normal function. There is moderately increased left ventricular hypertrophy.  2. Definity contrast agent was given IV to delineate the left ventricular endocardial borders.  3. Left ventricular diastolic function could not be evaluated.  4. Global right ventricle has normal systolic function.The right ventricular size is normal.  5. Left atrial size was normal.  6. Right atrial size was normal.  7. The mitral valve is normal in structure. Trace mitral valve regurgitation. No evidence of mitral stenosis.  8. The tricuspid valve is normal in structure. Tricuspid valve regurgitation is trivial.  9. The aortic valve is tricuspid. Aortic valve regurgitation is mild. Mild aortic valve sclerosis without stenosis. 10. The pulmonic valve was not well visualized. Pulmonic valve regurgitation is not visualized. 11. Aortic dilatation noted. 12. 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 13. The inferior vena cava is normal in size with greater than 50% respiratory variability, suggesting right atrial pressure of 3 mmHg. 14. Definity used; normal LV systolic function; moderate LVH; moderate to severe dilatation of aortic root/ascending aorta (5.2 cm; suggest CTA or MRA to further assess); mild AI.  ASSESSMENT & PLAN:   1. Persistent atrial fibrillation - LVEF 50-55%. Symptomatic with activity. 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 Rate controlled. Compliant with Eliquis. Will schedule cardioversion. Risk and benefits discussed with patient and he is willing to proceed.  2.  Thoracic aortic aneurysm - Moderate to severe dilatation of aortic root/ascending aorta to 5.2 cm by echo. (dilatation of the ascending aorta 4.8 cm by chest CT on 08/2018).  I have forwardsed result to Dr.  Tyrone SageGerhardt and Dr. Clifton JamesMcAlhany to see if need to do CT prior to schedule date in May 2021.  3. CAD - No angina. Continue ASA and Zetia.   4. HTN - BP stable on current medications.   5. HLD - No Zetia. Statin intolerance.    Medication Adjustments/Labs and Tests Ordered: Current medicines are reviewed at length with the patient today.  Concerns regarding medicines are outlined above.  Medication changes, Labs and Tests ordered today are listed in the Patient Instructions below. Patient Instructions  Medication Instructions:   *If you need a refill on your cardiac medications before your next appointment, please call your pharmacy*  Lab Work: Your physician recommends that you have lab work today- BMET and CBC  If you have labs (blood work) drawn today and your tests are completely normal, you will receive your results only by: Marland Kitchen. MyChart Message (if you have MyChart) OR . A paper copy in the mail If you have any lab test that is abnormal or we need to change your treatment, we will call you to review the results.  Your Pre-procedure COVID-19 Testing will be done on 03/10/19 at [facility] at [address], [place], [place] [address]. Once you arrive at the testing site, stay in the right hand lane, go under the building overhang not the tent. If you are tested under the tent your results may not be back before your procedure. Please be on time for your appointment.  After your swab you will be given a mask to wear and instructed to go home and quarantine/no visitors until after your procedure. If you test positive you will be notified and your procedure will be cancelled.   Testing/Procedures: Your physician has recommended that you have a Cardioversion (DCCV). Electrical Cardioversion uses a jolt of electricity to your heart either through paddles or wired patches attached to your chest. This is a controlled, usually prescheduled, procedure. Defibrillation is  done under light anesthesia in the hospital, and you usually go home the day of the procedure. This is done to get your heart back into a normal rhythm. You are not awake for the procedure. Please see the instruction sheet given to you today.  Follow-Up: At [facility], you and your health needs are our priority.  As part of our continuing mission to provide you with exceptional heart care, we have created designated Provider Care Teams.  These Care Teams include your primary Cardiologist (physician) and Advanced Practice Providers (APPs -  Physician Assistants and Nurse Practitioners) who all work together to provide you with the care you need, when you need it.  Your next appointment:   2 week(s) 03/27/19 at 2:30 pm  The format for your next appointment:   In Person  Provider:    You may see [address]Vin Kila Godina PA        Signed, Manson PasseyBhavinkumar Lilianah Buffin, [place]  03/03/2019 2:59 PM    [facility] [address], [place], [place]  [address] Phone: [phone]; Fax: [phone]

## 2019-03-04 LAB — BASIC METABOLIC PANEL
BUN/Creatinine Ratio: 15 (ref 10–24)
BUN: 15 mg/dL (ref 8–27)
CO2: 24 mmol/L (ref 20–29)
Calcium: 9.3 mg/dL (ref 8.6–10.2)
Chloride: 106 mmol/L (ref 96–106)
Creatinine, Ser: 1.01 mg/dL (ref 0.76–1.27)
GFR calc Af Amer: 83 mL/min/{1.73_m2} (ref >59)
GFR calc non Af Amer: 72 mL/min/{1.73_m2} (ref >59)
Glucose: 104 mg/dL — ABNORMAL HIGH (ref 65–99)
Potassium: 4.6 mmol/L (ref 3.5–5.2)
Sodium: 145 mmol/L — ABNORMAL HIGH (ref 134–144)

## 2019-03-04 LAB — CBC WITH DIFFERENTIAL/PLATELET
Basophils Absolute: 0 10*3/uL (ref 0.0–0.2)
Basos: 0 %
EOS (ABSOLUTE): 0.1 10*3/uL (ref 0.0–0.4)
Eos: 1 %
Hematocrit: 44.4 % (ref 37.5–51.0)
Hemoglobin: 15.1 g/dL (ref 13.0–17.7)
Immature Grans (Abs): 0 10*3/uL (ref 0.0–0.1)
Immature Granulocytes: 0 %
Lymphocytes Absolute: 2.2 10*3/uL (ref 0.7–3.1)
Lymphs: 28 %
MCH: 30.9 pg (ref 26.6–33.0)
MCHC: 34 g/dL (ref 31.5–35.7)
MCV: 91 fL (ref 79–97)
Monocytes Absolute: 0.8 10*3/uL (ref 0.1–0.9)
Monocytes: 10 %
Neutrophils Absolute: 4.7 10*3/uL (ref 1.4–7.0)
Neutrophils: 61 %
Platelets: 246 10*3/uL (ref 150–450)
RBC: 4.88 x10E6/uL (ref 4.14–5.80)
RDW: 12.3 % (ref 11.6–15.4)
WBC: 7.8 10*3/uL (ref 3.4–10.8)

## 2019-03-10 ENCOUNTER — Other Ambulatory Visit (HOSPITAL_COMMUNITY)
Admission: RE | Admit: 2019-03-10 | Discharge: 2019-03-10 | Disposition: A | Discharge status: Home / Self Care | Payer: Medicare Other | Source: Ambulatory Visit | Attending: Internal Medicine | Admitting: Internal Medicine

## 2019-03-10 DIAGNOSIS — Z20828 Contact with and (suspected) exposure to other viral communicable diseases: Secondary | ICD-10-CM | POA: Diagnosis not present

## 2019-03-10 DIAGNOSIS — Z01812 Encounter for preprocedural laboratory examination: Secondary | ICD-10-CM | POA: Diagnosis present

## 2019-03-11 LAB — NOVEL CORONAVIRUS, NAA (HOSP ORDER, SEND-OUT TO REF LAB; TAT 18-24 HRS): SARS-CoV-2, NAA: NOT DETECTED

## 2019-03-12 NOTE — Anesthesia Preprocedure Evaluation (Addendum)
Anesthesia Evaluation  Patient identified by MRN, date of birth, ID band Patient awake    Reviewed: Allergy & Precautions, NPO status , Patient's Chart, lab work & pertinent test results  Airway Mallampati: II  TM Distance: >3 FB Neck ROM: Full    Dental no notable dental hx.    Pulmonary former smoker,    Pulmonary exam normal breath sounds clear to auscultation       Cardiovascular hypertension, Pt. on medications + CAD and + Cardiac Stents  Normal cardiovascular exam+ dysrhythmias Atrial Fibrillation  Rhythm:Regular Rate:Normal  ECG: a-fib, rate 75  ECHO:  1. Left ventricular ejection fraction, by visual estimation, is 50 to 55%. The left ventricle has normal function. There is moderately increased left ventricular hypertrophy.  2. Definity contrast agent was given IV to delineate the left ventricular endocardial borders.  3. Left ventricular diastolic function could not be evaluated.  4. Global right ventricle has normal systolic function.The right ventricular size is normal.  5. Left atrial size was normal.  6. Right atrial size was normal.  7. The mitral valve is normal in structure. Trace mitral valve regurgitation. No evidence of mitral stenosis.  8. The tricuspid valve is normal in structure. Tricuspid valve regurgitation is trivial.  9. The aortic valve is tricuspid. Aortic valve regurgitation is mild. Mild aortic valve sclerosis without stenosis. 10. The pulmonic valve was not well visualized. Pulmonic valve regurgitation is not visualized. 11. Aortic dilatation noted. 12. 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 13. The inferior vena cava is normal in size with greater than 50% respiratory variability, suggesting right atrial pressure of 3 mmHg. 14. Definity used; normal LV systolic function; moderate LVH; moderate to severe dilatation of aortic root/ascending  aorta (5.2 cm; suggest CTA or MRA to further assess); mild AI.   Neuro/Psych negative neurological ROS  negative psych ROS   GI/Hepatic Neg liver ROS, GERD  Medicated and Controlled,  Endo/Other  negative endocrine ROS  Renal/GU negative Renal ROS     Musculoskeletal negative musculoskeletal ROS (+)   Abdominal (+) + obese,   Peds  Hematology negative hematology ROS (+)   Anesthesia Other Findings A-FIB  Reproductive/Obstetrics                            Anesthesia Physical Anesthesia Plan  ASA: III  Anesthesia Plan: General   Post-op Pain Management:    Induction: Intravenous  PONV Risk Score and Plan: 2 and Propofol infusion and Treatment may vary due to age or medical condition  Airway Management Planned: Mask  Additional Equipment:   Intra-op Plan:   Post-operative Plan:   Informed Consent: I have reviewed the patients History and Physical, chart, labs and discussed the procedure including the risks, benefits and alternatives for the proposed anesthesia with the patient or authorized representative who has indicated his/her understanding and acceptance.     Dental advisory given  Plan Discussed with: CRNA  Anesthesia Plan Comments:        Anesthesia Quick Evaluation

## 2019-03-13 ENCOUNTER — Encounter (HOSPITAL_COMMUNITY)
Admission: RE | Disposition: A | Discharge status: Home / Self Care | Payer: Self-pay | Source: Home / Self Care | Attending: Internal Medicine

## 2019-03-13 ENCOUNTER — Ambulatory Visit (HOSPITAL_COMMUNITY): Payer: Medicare Other | Admitting: Anesthesiology

## 2019-03-13 ENCOUNTER — Encounter (HOSPITAL_COMMUNITY): Payer: Self-pay

## 2019-03-13 ENCOUNTER — Other Ambulatory Visit: Payer: Self-pay

## 2019-03-13 ENCOUNTER — Ambulatory Visit (HOSPITAL_COMMUNITY)
Admission: RE | Admit: 2019-03-13 | Discharge: 2019-03-13 | Disposition: A | Discharge status: Home / Self Care | Payer: Medicare Other | Attending: Internal Medicine | Admitting: Internal Medicine

## 2019-03-13 DIAGNOSIS — E785 Hyperlipidemia, unspecified: Secondary | ICD-10-CM | POA: Insufficient documentation

## 2019-03-13 DIAGNOSIS — I491 Atrial premature depolarization: Secondary | ICD-10-CM | POA: Insufficient documentation

## 2019-03-13 DIAGNOSIS — Z888 Allergy status to other drugs, medicaments and biological substances status: Secondary | ICD-10-CM | POA: Diagnosis not present

## 2019-03-13 DIAGNOSIS — Z955 Presence of coronary angioplasty implant and graft: Secondary | ICD-10-CM | POA: Insufficient documentation

## 2019-03-13 DIAGNOSIS — I440 Atrioventricular block, first degree: Secondary | ICD-10-CM | POA: Diagnosis not present

## 2019-03-13 DIAGNOSIS — Z79899 Other long term (current) drug therapy: Secondary | ICD-10-CM | POA: Diagnosis not present

## 2019-03-13 DIAGNOSIS — I712 Thoracic aortic aneurysm, without rupture: Secondary | ICD-10-CM | POA: Insufficient documentation

## 2019-03-13 DIAGNOSIS — Z7982 Long term (current) use of aspirin: Secondary | ICD-10-CM | POA: Insufficient documentation

## 2019-03-13 DIAGNOSIS — M1990 Unspecified osteoarthritis, unspecified site: Secondary | ICD-10-CM | POA: Insufficient documentation

## 2019-03-13 DIAGNOSIS — I4819 Other persistent atrial fibrillation: Secondary | ICD-10-CM | POA: Diagnosis present

## 2019-03-13 DIAGNOSIS — I252 Old myocardial infarction: Secondary | ICD-10-CM | POA: Diagnosis not present

## 2019-03-13 DIAGNOSIS — I4891 Unspecified atrial fibrillation: Secondary | ICD-10-CM

## 2019-03-13 DIAGNOSIS — I2510 Atherosclerotic heart disease of native coronary artery without angina pectoris: Secondary | ICD-10-CM | POA: Diagnosis not present

## 2019-03-13 DIAGNOSIS — Z7901 Long term (current) use of anticoagulants: Secondary | ICD-10-CM | POA: Diagnosis not present

## 2019-03-13 DIAGNOSIS — Z8249 Family history of ischemic heart disease and other diseases of the circulatory system: Secondary | ICD-10-CM | POA: Diagnosis not present

## 2019-03-13 DIAGNOSIS — Z87891 Personal history of nicotine dependence: Secondary | ICD-10-CM | POA: Insufficient documentation

## 2019-03-13 DIAGNOSIS — I10 Essential (primary) hypertension: Secondary | ICD-10-CM | POA: Insufficient documentation

## 2019-03-13 DIAGNOSIS — K219 Gastro-esophageal reflux disease without esophagitis: Secondary | ICD-10-CM | POA: Diagnosis not present

## 2019-03-13 HISTORY — PX: CARDIOVERSION: SHX1299

## 2019-03-13 SURGERY — CARDIOVERSION
Anesthesia: General

## 2019-03-13 MED ORDER — SODIUM CHLORIDE 0.9 % IV SOLN
INTRAVENOUS | Status: DC
  Administered 2019-03-13: 08:00:00 via INTRAVENOUS

## 2019-03-13 MED ORDER — PROPOFOL 10 MG/ML IV BOLUS
INTRAVENOUS | Status: DC | PRN
  Administered 2019-03-13: 100 mg via INTRAVENOUS
  Administered 2019-03-13: 30 mg via INTRAVENOUS

## 2019-03-13 MED ORDER — LIDOCAINE 2% (20 MG/ML) 5 ML SYRINGE
INTRAMUSCULAR | Status: DC | PRN
  Administered 2019-03-13: 60 mg via INTRAVENOUS

## 2019-03-13 NOTE — CV Procedure (Signed)
CARDIOVERSION  Pt sedated by anestheisa with Propofol and lidocaine  intravenously  With pads in AP position, patient cardioverted to SR with 200 J synchronized biphasic energy  Procedure without complication 12 lead EKG pending   Dorris Carnes MD

## 2019-03-13 NOTE — Transfer of Care (Signed)
Immediate Anesthesia Transfer of Care Note  Patient: Ronald [name]  Procedure(s) Performed: CARDIOVERSION (N/A )  Patient Location: Endoscopy Unit  Anesthesia Type:General  Level of Consciousness: drowsy and patient cooperative  Airway & Oxygen Therapy: Patient Spontanous Breathing  Post-op Assessment: Report given to RN, Post -op Vital signs reviewed and stable and Patient moving all extremities  Post vital signs: Reviewed and stable  Last Vitals:  Vitals Value Taken Time  BP    Temp 36.6 C 03/13/19 0840  Pulse 74 03/13/19 0840  Resp 22 03/13/19 0840  SpO2 100 % 03/13/19 0840    Last Pain:  Vitals:   03/13/19 0840  TempSrc: Temporal  PainSc: 0-No pain         Complications: No apparent anesthesia complications

## 2019-03-13 NOTE — Interval H&P Note (Signed)
History and Physical Interval Note:  03/13/2019 8:22 AM  [name]  has presented today for surgery, with the diagnosis of AFIB.  The various methods of treatment have been discussed with the patient and family. After consideration of risks, benefits and other options for treatment, the patient has consented to  Procedure(s): CARDIOVERSION (N/A) as a surgical intervention.  The patient's history has been reviewed, patient examined, no change in status, stable for surgery.  I have reviewed the patient's chart and labs.  Questions were answered to the patient's satisfaction.     Dorris Carnes

## 2019-03-13 NOTE — Anesthesia Postprocedure Evaluation (Signed)
Anesthesia Post Note  Patient: [name]  Procedure(s) Performed: CARDIOVERSION (N/A )     Patient location during evaluation: Endoscopy Anesthesia Type: General Level of consciousness: awake and alert Pain management: pain level controlled Vital Signs Assessment: post-procedure vital signs reviewed and stable Respiratory status: spontaneous breathing, nonlabored ventilation, respiratory function stable and patient connected to nasal cannula oxygen Cardiovascular status: blood pressure returned to baseline and stable Postop Assessment: no apparent nausea or vomiting Anesthetic complications: no    Last Vitals:  Vitals:   03/13/19 0858 03/13/19 0900  BP: 121/78   Pulse: 66   Resp: 16   Temp:    SpO2: 98% 98%    Last Pain:  Vitals:   03/13/19 0858  TempSrc:   PainSc: 0-No pain                 Ryan P Ellender

## 2019-03-15 ENCOUNTER — Telehealth: Payer: Self-pay | Admitting: Cardiology

## 2019-03-15 NOTE — Telephone Encounter (Signed)
Pt called in reporting he has been short of breath for the past 2 days. Underwent DCCV on Friday with Dr. Harrington Challenger for a recent Dx of Afib and did feel slightly better afterwards. Yesterday developed recurrent dyspnea which was similar to what he was experiencing prior to cardioversion. Has continued intermittently throughout yesterday into today. HR has remained in the mid to up 60s and blood pressures are controlled. He has no chest pain, lightheadedness, dizziness or palpitations. Difficult to determine whether he is back in Afib as his HR was never elevated when Afib was discovered. He has remained on Eliquis without missing doses. Given he has no other symptoms I instructed I would send a message to the office for scheduling to call about getting a sooner appt. ER precautions given. Denies any fever, cough, congestion or COVID exposures. He was agreeable to this plan and thanked me for the follow up call.

## 2019-03-16 ENCOUNTER — Ambulatory Visit (HOSPITAL_COMMUNITY)
Admission: RE | Admit: 2019-03-16 | Discharge: 2019-03-16 | Disposition: A | Discharge status: Home / Self Care | Payer: Medicare Other | Source: Ambulatory Visit | Attending: Physician Assistant | Admitting: Physician Assistant

## 2019-03-16 ENCOUNTER — Telehealth: Payer: Self-pay | Admitting: *Deleted

## 2019-03-16 ENCOUNTER — Encounter (HOSPITAL_COMMUNITY): Payer: Self-pay | Admitting: Internal Medicine

## 2019-03-16 ENCOUNTER — Telehealth: Payer: Self-pay | Admitting: Cardiovascular Disease

## 2019-03-16 ENCOUNTER — Other Ambulatory Visit: Payer: Self-pay

## 2019-03-16 VITALS — BP 146/70 | HR 69 | Ht 79.0 in | Wt 287.4 lb

## 2019-03-16 DIAGNOSIS — E7800 Pure hypercholesterolemia, unspecified: Secondary | ICD-10-CM | POA: Insufficient documentation

## 2019-03-16 DIAGNOSIS — I491 Atrial premature depolarization: Secondary | ICD-10-CM | POA: Insufficient documentation

## 2019-03-16 DIAGNOSIS — M4322 Fusion of spine, cervical region: Secondary | ICD-10-CM | POA: Diagnosis not present

## 2019-03-16 DIAGNOSIS — I471 Supraventricular tachycardia: Secondary | ICD-10-CM | POA: Insufficient documentation

## 2019-03-16 DIAGNOSIS — K219 Gastro-esophageal reflux disease without esophagitis: Secondary | ICD-10-CM | POA: Diagnosis not present

## 2019-03-16 DIAGNOSIS — Z881 Allergy status to other antibiotic agents status: Secondary | ICD-10-CM | POA: Diagnosis not present

## 2019-03-16 DIAGNOSIS — I2510 Atherosclerotic heart disease of native coronary artery without angina pectoris: Secondary | ICD-10-CM | POA: Insufficient documentation

## 2019-03-16 DIAGNOSIS — Z801 Family history of malignant neoplasm of trachea, bronchus and lung: Secondary | ICD-10-CM | POA: Diagnosis not present

## 2019-03-16 DIAGNOSIS — Z8249 Family history of ischemic heart disease and other diseases of the circulatory system: Secondary | ICD-10-CM | POA: Diagnosis not present

## 2019-03-16 DIAGNOSIS — R0600 Dyspnea, unspecified: Secondary | ICD-10-CM | POA: Diagnosis not present

## 2019-03-16 DIAGNOSIS — I4819 Other persistent atrial fibrillation: Secondary | ICD-10-CM | POA: Diagnosis not present

## 2019-03-16 DIAGNOSIS — I10 Essential (primary) hypertension: Secondary | ICD-10-CM | POA: Diagnosis not present

## 2019-03-16 DIAGNOSIS — Z6832 Body mass index (BMI) 32.0-32.9, adult: Secondary | ICD-10-CM | POA: Insufficient documentation

## 2019-03-16 DIAGNOSIS — D6869 Other thrombophilia: Secondary | ICD-10-CM

## 2019-03-16 DIAGNOSIS — I351 Nonrheumatic aortic (valve) insufficiency: Secondary | ICD-10-CM | POA: Insufficient documentation

## 2019-03-16 DIAGNOSIS — E785 Hyperlipidemia, unspecified: Secondary | ICD-10-CM | POA: Insufficient documentation

## 2019-03-16 DIAGNOSIS — Z7901 Long term (current) use of anticoagulants: Secondary | ICD-10-CM | POA: Insufficient documentation

## 2019-03-16 DIAGNOSIS — I259 Chronic ischemic heart disease, unspecified: Secondary | ICD-10-CM | POA: Insufficient documentation

## 2019-03-16 DIAGNOSIS — Z87891 Personal history of nicotine dependence: Secondary | ICD-10-CM | POA: Diagnosis not present

## 2019-03-16 DIAGNOSIS — Z888 Allergy status to other drugs, medicaments and biological substances status: Secondary | ICD-10-CM | POA: Insufficient documentation

## 2019-03-16 DIAGNOSIS — Z79899 Other long term (current) drug therapy: Secondary | ICD-10-CM | POA: Insufficient documentation

## 2019-03-16 DIAGNOSIS — M1990 Unspecified osteoarthritis, unspecified site: Secondary | ICD-10-CM | POA: Diagnosis not present

## 2019-03-16 DIAGNOSIS — Z91018 Allergy to other foods: Secondary | ICD-10-CM | POA: Insufficient documentation

## 2019-03-16 DIAGNOSIS — Z955 Presence of coronary angioplasty implant and graft: Secondary | ICD-10-CM | POA: Insufficient documentation

## 2019-03-16 DIAGNOSIS — Z7982 Long term (current) use of aspirin: Secondary | ICD-10-CM | POA: Diagnosis not present

## 2019-03-16 DIAGNOSIS — E669 Obesity, unspecified: Secondary | ICD-10-CM | POA: Insufficient documentation

## 2019-03-16 DIAGNOSIS — R9431 Abnormal electrocardiogram [ECG] [EKG]: Secondary | ICD-10-CM | POA: Insufficient documentation

## 2019-03-16 DIAGNOSIS — I4510 Unspecified right bundle-branch block: Secondary | ICD-10-CM | POA: Insufficient documentation

## 2019-03-16 DIAGNOSIS — I444 Left anterior fascicular block: Secondary | ICD-10-CM | POA: Insufficient documentation

## 2019-03-16 NOTE — Telephone Encounter (Signed)
Per Reino Bellis, pt is still having SOB after cardioversion on 03/13/19. I rescheduled his OV appt for Wednesday 03/18/19 with Robbie Lis but he states he would like an appt sooner than this. Please advise if he needs to be seen in office sooner than Wednesday.

## 2019-03-16 NOTE — Progress Notes (Signed)
Primary Care Physician: [name], MD Primary Cardiologist: Dr Clifton Maicol Primary Electrophysiologist: none Referring Physician: Dr Herbert Pun is a 78 y.o. male with a history of CAD s/p stent RCA 2004, HTN, HLD, thoracic aortic aneurysm(followed by Dr. Tyrone Sage), and persistent atrial fibrillation who presents for consultation in the [facility].  The patient was initially diagnosed with atrial fibrillation on 02/17/19 after presenting with symptoms of SOB and was found to be in rate controlled afib. He was started on Eliquis for a CHADS2VASC score of 4. He underwent DCCV on 03/13/19. He reports that he had some minimal improvement in his symptoms of dyspnea but it is still persistent. He is in SR today. He denies significant snoring or alcohol use.  Today, he denies symptoms of palpitations, chest pain, orthopnea, PND, lower extremity edema, dizziness, presyncope, syncope, snoring, daytime somnolence, bleeding, or neurologic sequela. The patient is tolerating medications without difficulties and is otherwise without complaint today.    Atrial Fibrillation Risk Factors:  he does not have symptoms or diagnosis of sleep apnea. he does not have a history of rheumatic fever. he does not have a history of alcohol use. The patient does have a history of early familial atrial fibrillation or other arrhythmias. Mother had afib.  he has a BMI of Body mass index is 32.38 kg/m.Marland Kitchen Filed Weights   03/16/19 1406  Weight: 130.4 kg    Family History  Problem Relation Age of Onset   Heart disease Father    Heart attack Father        80   Cancer Mother 66       LUNG   Hypertension Neg Hx    Stroke Neg Hx      Atrial Fibrillation Management history:  Previous antiarrhythmic drugs: none Previous cardioversions: 03/13/19 Previous ablations: none CHADS2VASC score: 4 Anticoagulation history: Eliquis   Past Medical History:  Diagnosis Date     Arthritis    Coronary artery disease    Stent 2004   GERD (gastroesophageal reflux disease)    History of echocardiogram    Echo 5/17: mild LVH, EF 50-55%, no RWMA, Gr 2 DD, mild to mod AI, PASP 32 mmHg   History of nuclear stress test    Myoview 5/17 - EF 48%, no scar, no ischemia. 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    Lung nodules    noted on past CT scans   Malaria    SVT (supraventricular tachycardia) (HCC)    remote episode during exercise test   Thoracic aortic aneurysm [facility])    last scan in Jan 2013. Now measuring 5.0cm. Referred to Dr. Tyrone Sage   Wears dentures    bottom   Wears glasses    Past Surgical History:  Procedure Laterality Date   CARDIOVERSION N/A 03/13/2019   Procedure: CARDIOVERSION;  Surgeon: Pricilla Riffle, MD;  Location: [facility] ENDOSCOPY;  Service: Cardiovascular;  Laterality: N/A;   CERVICAL FUSION  03-2010   c2-c7   COLONOSCOPY     x4   CORONARY STENT PLACEMENT  2004   dental implant     x  2   ELBOW SURGERY  1997   lt and rt   EYE SURGERY     HIP PINNING,CANNULATED Right 05/11/2014   Procedure: CANNULATED HIP PINNING;  Surgeon: Sheral Apley, MD;  Location: [facility] OR;  Service: Orthopedics;  Laterality: Right;   RETINAL DETACHMENT SURGERY  1975   lt   rotator cuff surgery  1997   lt   SHOULDER ARTHROSCOPY  2/15   right-got cardiac clearance-[facility]sc   TONSILLECTOMY     TRIGGER FINGER RELEASE Left 10/12/2013   Procedure: LEFT LONG FINGER TRIGGER RELEASE;  Surgeon: Jodi Marble, MD;  Location: [facility];  Service: Orthopedics;  Laterality: Left;    Current Outpatient Medications  Medication Sig Dispense Refill   apixaban (ELIQUIS) 5 MG TABS tablet Take 1  tablet (5 mg total) by mouth 2 (two) times daily. 60 tablet 11   aspirin EC 81 MG tablet Take 1 tablet (81 mg total) by mouth daily. 90 tablet 3   chlorthalidone (HYGROTON) 25 MG tablet Take 12.5 mg by mouth every other day.      clobetasol ointment (TEMOVATE) 0.05 % Apply 1 application topically 2 (two) times daily as needed (irritation).     Coenzyme Q10 (COQ-10) 200 MG CAPS Take 200 mg by mouth every morning.      colesevelam (WELCHOL) 625 MG tablet Take 3,750 mg by mouth daily after supper. 6 tablets     ezetimibe (ZETIA) 10 MG tablet Take 5 mg by mouth daily. Takes half tab of 10mg      Glucosamine-Chondroit-Vit C-Mn (GLUCOSAMINE CHONDR 1500 COMPLX) CAPS Take 2 capsules by mouth every morning.      hydrocortisone 2.5 % cream Apply 1 application topically as needed (RASH). Use on head     ketoconazole (NIZORAL) 2 % cream Apply 1 application topically daily as needed for irritation.     lisinopril (PRINIVIL,ZESTRIL) 40 MG tablet Take 40 mg by mouth daily after supper.      LIVALO 4 MG TABS Take 2 mg by mouth at bedtime as needed.      Multiple Vitamin (MULTIVITAMIN WITH MINERALS) TABS tablet Take 1 tablet by mouth every morning.      nitroGLYCERIN (NITROSTAT) 0.4 MG SL tablet Place 1 tablet (0.4 mg total) under the tongue every 5 (five) minutes as needed for chest pain. 25 tablet 11   omeprazole (PRILOSEC) 20 MG capsule Take 20 mg by mouth every morning.      No current facility-administered medications for this encounter.     Allergies  Allergen Reactions   Promethazine Hcl Other (See Comments)    Agitation and incoherence (phenergan)   Statins Other (See Comments)    myalgias   Vesicare [Solifenacin Succinate] Itching and Rash   Quinolones     Patient was warned about not using Cipro and similar antibiotics. Recent studies have raised concern that fluoroquinolone antibiotics could be associated with an increased risk of aortic aneurysm Fluoroquinolones have  non-antimicrobial properties that might jeopardise the integrity of the extracellular matrix of the vascular wall In a  propensity score matched cohort study in , there was a 66% increased rate of aortic aneurysm or dissection associated with oral fluoroquinolone use, compared wit    Social History   Socioeconomic History   Marital status: Married    Spouse name: Not on file   Number of children: 1   Years of education: Not on file   Highest education level: Not on file  Occupational History   Occupation:  retired Advertising account executiveschool teacher    Employer: RETIRED  Social Network [employer]   Financial resource strain: Not on file   Food insecurity    Worry: Not on file    Inability: Not on file   Transportation needs    Medical: Not on file    Non-medical: Not on file  Tobacco Use   Smoking status: Former Smoker    Quit date: 04/09/1969    Years since quitting: 49.9   Smokeless tobacco: Current User    Types: Chew   Tobacco comment: reports that when he smoked it was intermittent  Substance and Sexual Activity   Alcohol use: Yes    Alcohol/week: 1.0 standard drinks    Types: 1 Standard drinks or equivalent per week    Comment: occasional mainly during holiday   Drug use: No   Sexual activity: Yes  Lifestyle   Physical activity    Days per week: Not on file    Minutes per session: Not on file   Stress: Not on file  Relationships   Social connections    Talks on phone: Not on file    Gets together: Not on file    Attends religious service: Not on file    Active member of club or organization: Not on file    Attends meetings of clubs or organizations: Not on file    Relationship status: Not on file   Intimate partner violence    Fear of current or ex partner: Not on file    Emotionally abused: Not on file    Physically abused: Not on file    Forced sexual activity: Not on file  Other Topics Concern   Not on file  Social History Narrative   Not on file     ROS-  All systems are reviewed and negative except as per the HPI above.  Physical Exam: Vitals:   03/16/19 1406  BP: (!) 146/70  Pulse: 69  SpO2: 97%  Weight: 130.4 kg  Height: 6\' 7"  (2.007 m)    GEN- The patient is well appearing obese elderly male, alert and oriented x 3 today.   Head- normocephalic, atraumatic Eyes-  Sclera clear, conjunctiva pink Ears- hearing intact Oropharynx- clear Neck- supple  Lungs- Clear to ausculation bilaterally, normal work of breathing Heart- Regular rate and rhythm, occasional ectopic beats. no murmurs, rubs or gallops  GI- soft, NT, ND, + BS Extremities- no clubbing, cyanosis, or edema MS- no significant deformity or atrophy Skin- no rash or lesion Psych- euthymic mood, full affect Neuro- strength and sensation are intact  Wt Readings from Last 3 Encounters:  03/16/19 130.4 kg  03/13/19 127 kg  03/03/19 131.9 kg    EKG today demonstrates SR HR 69, PACs, inc RBBB, PR 188, QRS 114, QTc 432  Echo 02/26/19 demonstrated   1. Left ventricular ejection fraction, by visual estimation, is 50 to 55%. The left ventricle has normal function. There is moderately increased left ventricular hypertrophy.  2. Definity contrast agent was given IV to delineate the left ventricular endocardial borders.  3. Left ventricular diastolic function could not be evaluated.  4. Global right ventricle has normal systolic function.The right ventricular size is normal.  5. Left atrial size was normal.  6. Right atrial size was normal.  7. The mitral valve is normal in structure. Trace mitral valve regurgitation. No evidence of mitral stenosis.  8. The tricuspid valve is normal in structure. Tricuspid valve regurgitation is trivial.  9. The aortic valve is tricuspid.  Aortic valve regurgitation is mild. Mild aortic valve sclerosis without stenosis. 10. The pulmonic valve was not well visualized. Pulmonic valve regurgitation is not visualized. 11. Aortic dilatation noted. 12.  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 13. The inferior vena cava is normal in size with greater than 50% respiratory variability, suggesting right atrial pressure of 3 mmHg. 14. Definity used; normal LV systolic function; moderate LVH; moderate to severe dilatation of aortic root/ascending aorta (5.2 cm; suggest CTA or MRA to further assess); mild AI.  LA 4.8 cm  Epic records are reviewed at length today  Assessment and Plan:  1. Persistent atrial fibrillation S/p DCCV on 03/13/19. General education about afib provided and questions answered. We also discussed his stroke risk and the risks and benefits of anticoagulation. Would consider AAD if his afib should become more persistent.  Continue Eliquis 5 mg BID He has been intolerant of BB in the past 2/2 bradycardia.  This patients CHA2DS2-VASc Score and unadjusted Ischemic Stroke Rate (% per year) is equal to 4.8 % stroke rate/year from a score of 4  Above score calculated as 1 point each if present [CHF, HTN, DM, Vascular=MI/PAD/Aortic Plaque, Age if 65-74, or Male] Above score calculated as 2 points each if present [Age > 75, or Stroke/TIA/TE]   2. Obesity Body mass index is 32.38 kg/m. Lifestyle modification was discussed at length including regular exercise and weight reduction. Patient admits he has become deconditioned and gained a significant amount of weight during the [place] pandemic.   3. HTN Stable, no changes today.  5. CAD S/p RCA stenting 2004. Followed by Dr Angelena Form.  6. Dyspnea Unclear etiology as he is in SR today and remains SOB. ? Anginal equivalent vs deconditioning. He denies CP. No signs of fluid overload. Echo showed preserved EF.   Follow up with Henry Russel PA as scheduled.    [facility] [address] [place], [place] [address] [phone] 03/16/2019 4:09 PM

## 2019-03-16 NOTE — Telephone Encounter (Signed)
I spoke with pt. He reports shortness of breath improved after cardioversion on Friday. On Saturday he noted some "breath issues"  Yesterday and today the shortness of breath is continuing. Similar to what he had prior to cardioversion. Not related to exertion.  Unable to tell if he is in irregular rhythm. I reviewed with afib clinic and they can see patient today at 2:00. I spoke with patient and gave him information for afib clinic appointment.  Will still keep appointment with B Bhagat, PA for 12/9 but this can be cancelled after today's appointment if not needed.

## 2019-03-16 NOTE — Telephone Encounter (Signed)
ERROR

## 2019-03-16 NOTE — Telephone Encounter (Signed)
Thanks Pat

## 2019-03-18 ENCOUNTER — Other Ambulatory Visit: Payer: Self-pay

## 2019-03-18 ENCOUNTER — Ambulatory Visit: Payer: Medicare Other | Admitting: Physician Assistant

## 2019-03-18 ENCOUNTER — Encounter: Payer: Self-pay | Admitting: Physician Assistant

## 2019-03-18 VITALS — BP 126/90 | HR 81 | Ht 79.0 in | Wt 288.2 lb

## 2019-03-18 DIAGNOSIS — I2510 Atherosclerotic heart disease of native coronary artery without angina pectoris: Secondary | ICD-10-CM

## 2019-03-18 DIAGNOSIS — I4819 Other persistent atrial fibrillation: Secondary | ICD-10-CM | POA: Diagnosis not present

## 2019-03-18 DIAGNOSIS — I351 Nonrheumatic aortic (valve) insufficiency: Secondary | ICD-10-CM

## 2019-03-18 DIAGNOSIS — I10 Essential (primary) hypertension: Secondary | ICD-10-CM | POA: Diagnosis not present

## 2019-03-18 MED ORDER — AMIODARONE HCL 200 MG PO TABS: 200.0000 mg | ORAL_TABLET | Freq: Every day | ORAL | 3 refills | Status: DC

## 2019-03-18 MED ORDER — APIXABAN 5 MG PO TABS: 5.0000 mg | ORAL_TABLET | Freq: Two times a day (BID) | ORAL | 5 refills | Status: DC

## 2019-03-18 NOTE — Patient Instructions (Signed)
Medication Instructions:  START: Amiodarone 200 mg  twice a day for 7 days then decrease to 1 tablet once a day  *If you need a refill on your cardiac medications before your next appointment, please call your pharmacy*  Lab Work: None ordered If you have labs (blood work) drawn today and your tests are completely normal, you will receive your results only by: Marland Kitchen MyChart Message (if you have MyChart) OR . A paper copy in the mail If you have any lab test that is abnormal or we need to change your treatment, we will call you to review the results.  Testing/Procedures: None ordered   Follow-Up: You are scheduled to see Dr. Angelena Form on 05/11/2019 @ 9:00 AM   Follow up with the A-fib clinic in 3 weeks   Other Instructions *none ordered

## 2019-03-18 NOTE — Progress Notes (Signed)
Cardiology Office Note    Date:  03/18/2019   ID:  Ronald [name], Ronald [name] 10-19-1940, MRN [id number]  PCP:  Ronald Infante, [name]  Cardiologist:  Dr. Angelena [name]  Chief Complaint: cardioversion follow up  History of Present Illness:   Ronald [name] is a 78 y.o. male with h/o CAD s/p stent RCA 2004, HTN, HLD, thoracic aortic aneurysm(followed by Dr. Servando [name]) and PAF here for follow up.   Last cardiac cath in 2013 with mild disease in the LAD and Circumflex, patent stent proximal RCA and moderate disease in the small PDA. He has not tolerated statins due to muscle aches or beta blockers secondary to bradycardia. Nuclear stress test 08/16/15 with no ischemia. Echo May 1093ATFT normal LV systolic function, mild to moderate AI. Chest CTA5/2020with stable dilated ascending aorta.  Seen by me 02/17/2019 for SOB and found to be in new onset afib at controlled rate. TSH normal started on Eliquis. Echo showed normal LVEf to 50-55%. No significant valvular abnormality. Moderate to severe dilatation of aortic root/ascending aorta to 5.2 cm I(dilatation of the ascending aorta 4.8 cm by chest CT on 08/2018). Underwent successful cardioversion on 03/03/2019. He felt good for few days then started on having SOB again. Seen in Afib clinic 12/7. He was in sinus rhythm.  Today here for follow. He felt good for 2 day post cardioversion and again had SOB. In afib today. No chest pain, palpitations, orthopnea, PND, syncope, LE edema, melena, dizziness or syncope.  Past Medical History:  Diagnosis Date   Arthritis    Coronary artery disease    Stent 2004   GERD (gastroesophageal reflux disease)    History of echocardiogram    Echo 5/17: mild LVH, EF 50-55%, no RWMA, Gr 2 DD, mild to mod AI, PASP 32 mmHg   History of nuclear stress test    Myoview 5/17 - EF 48%, no scar, no ischemia. 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    Lung nodules    noted on past CT scans   Malaria    SVT (supraventricular tachycardia) (HCC)    remote episode during exercise test   Thoracic aortic aneurysm [facility])    last scan in Jan 2013. Now measuring 5.0cm. Referred to Dr. Servando [name]   Wears dentures    bottom   Wears glasses     Past Surgical History:  Procedure Laterality Date   CARDIOVERSION N/A 03/13/2019   Procedure: CARDIOVERSION;  Surgeon: Ronald Records, [name];  Location: [facility] ENDOSCOPY;  Service: Cardiovascular;  Laterality: N/A;   CERVICAL FUSION  03-2010   c2-c7   COLONOSCOPY     x4   CORONARY STENT PLACEMENT  2004   dental implant     x 2   ELBOW SURGERY  1997   lt and rt   EYE SURGERY     HIP PINNING,CANNULATED Right 05/11/2014   Procedure: CANNULATED HIP PINNING;  Surgeon: Ronald [name];  Location: [place];  Service: Orthopedics;  Laterality: Right;   RETINAL DETACHMENT SURGERY  1975   lt   rotator cuff surgery  1997   lt   SHOULDER ARTHROSCOPY  2/15   right-got cardiac clearance-gsc   TONSILLECTOMY     TRIGGER FINGER RELEASE Left 10/12/2013   Procedure: LEFT LONG FINGER TRIGGER RELEASE;  Surgeon: Ronald [name];  Location: [facility];  Service: Orthopedics;  Laterality: Left;    Current Medications: Prior to Admission medications   Medication Sig Start Date End Date Taking? Authorizing Provider  apixaban (ELIQUIS) 5 MG TABS tablet Take 1 tablet (5 mg total) by mouth 2 (two) times daily. 02/17/19   Ronald [name]  aspirin EC 81 MG tablet Take 1 tablet (81 mg total) by mouth daily. 02/17/19   Ronald [name]  chlorthalidone (HYGROTON) 25 MG tablet Take 12.5 mg by mouth every other day.     Provider, Historical, [name]  clobetasol ointment (TEMOVATE) 0.05 % Apply 1 application topically 2  (two) times daily as needed (irritation).    Provider, Historical, [name]  Coenzyme Q10 (COQ-10) 200 MG CAPS Take 200 mg by mouth every morning.     Provider, Historical, [name]  colesevelam (WELCHOL) 625 MG tablet Take 3,750 mg by mouth daily after supper. 6 tablets    Provider, Historical, [name]  ezetimibe (ZETIA) 10 MG tablet Take 5 mg by mouth daily. Takes half tab of 10mg     Provider, Historical, [name]  Glucosamine-Chondroit-Vit C-Mn (GLUCOSAMINE CHONDR 1500 COMPLX) CAPS Take 2 capsules by mouth every morning.     Provider, Historical, [name]  hydrocortisone 2.5 % cream Apply 1 application topically as needed (RASH). Use on head 07/09/14   Provider, Historical, [name]  ketoconazole (NIZORAL) 2 % cream Apply 1 application topically daily as needed for irritation.    Provider, Historical, [name]  lisinopril (PRINIVIL,ZESTRIL) 40 MG tablet Take 40 mg by mouth daily after supper.     Provider, Historical, [name]  LIVALO 4 MG TABS Take 2 mg by mouth at bedtime as needed.  02/24/19   Provider, Historical, [name]  Multiple Vitamin (MULTIVITAMIN WITH MINERALS) TABS tablet Take 1 tablet by mouth every morning.     Provider, Historical, [name]  nitroGLYCERIN (NITROSTAT) 0.4 MG SL tablet Place 1 tablet (0.4 mg total) under the tongue every 5 (five) minutes as needed for chest pain. 08/02/15   Ronald [name], Ronald [name], [name]  omeprazole (PRILOSEC) 20 MG capsule Take 20 mg by mouth every morning.     Provider, Historical, [name]    Allergies:   Promethazine hcl, Statins, Vesicare [solifenacin succinate], and Quinolones   Social History   Socioeconomic History   Marital status: Married    Spouse name: Not on file   Number of children: 1   Years of education: Not on file   Highest education level: Not on file  Occupational History   Occupation: retired Advertising account executiveschool teacher    Employer: RETIRED  Social Needs   Financial resource strain: Not on file   Food insecurity    Worry: Not on file    Inability: Not on file   Transportation needs     Medical: Not on file    Non-medical: Not on file  Tobacco Use   Smoking status: Former Smoker    Quit date: 04/09/1969    Years since quitting: 49.9   Smokeless tobacco: Current User    Types: Chew   Tobacco comment: reports that when he smoked it was intermittent  Substance and Sexual Activity   Alcohol use: Yes    Alcohol/week: 1.0 standard drinks    Types: 1 Standard drinks or equivalent per week    Comment: occasional mainly during  holiday   Drug use: No   Sexual activity: Yes  Lifestyle   Physical activity    Days per week: Not on file    Minutes per session: Not on file   Stress: Not on file  Relationships   Social connections    Talks on phone: Not on file    Gets together: Not on file    Attends religious service: Not on file    Active member of club or organization: Not on file    Attends meetings of clubs or organizations: Not on file    Relationship status: Not on file  Other Topics Concern   Not on file  Social History Narrative   Not on file     Family History:  The patient's family history includes Cancer (age of onset: 60) in his mother; Heart attack in his father; Heart disease in his father.   ROS:   Please see the history of present illness.    ROS All other systems reviewed and are negative.   PHYSICAL EXAM:   VS:  BP 126/90    Pulse 81    Ht 6\' 7"  (2.007 m)    Wt 288 lb 3.2 oz (130.7 kg)    SpO2 99%    BMI 32.47 kg/m    GEN: Well nourished, well developed, in no acute distress  HEENT: normal  Neck: no JVD, carotid bruits, or masses Cardiac: Ir IR ; no murmurs, rubs, or gallops,no edema  Respiratory:  clear to auscultation bilaterally, normal work of breathing GI: soft, nontender, nondistended, + BS MS: no deformity or atrophy  Skin: warm and dry, no rash Neuro:  Alert and Oriented x 3, Strength and sensation are intact Psych: euthymic mood, full affect  Wt Readings from Last 3 Encounters:  03/18/19 288 lb 3.2 oz (130.7 kg)    03/16/19 287 lb 6.4 oz (130.4 kg)  03/13/19 280 lb (127 kg)      Studies/Labs Reviewed:   EKG:  EKG is ordered today.  The ekg ordered today demonstrates afib at rate of 91 bpm  Recent Labs: 02/17/2019: TSH 2.140 03/03/2019: BUN 15; Creatinine, Ser 1.01; Hemoglobin 15.1; Platelets 246; Potassium 4.6; Sodium 145   Lipid Panel    Component Value Date/Time   CHOL 184 11/14/2010 1221   TRIG 143.0 11/14/2010 1221   HDL 48.60 11/14/2010 1221   CHOLHDL 4 11/14/2010 1221   VLDL 28.6 11/14/2010 1221   LDLCALC 107 (H) 11/14/2010 1221    Additional studies/ [name] that were reviewed today include:   Echocardiogram: 02/2019 1. Left ventricular ejection fraction, by visual estimation, is 50 to 55%. The left ventricle has normal function. There is moderately increased left ventricular hypertrophy. 2. Definity contrast agent was given IV to delineate the left ventricular endocardial borders. 3. Left ventricular diastolic function could not be evaluated. 4. Global right ventricle has normal systolic function.The right ventricular size is normal. 5. Left atrial size was normal. 6. Right atrial size was normal. 7. The mitral valve is normal in structure. Trace mitral valve regurgitation. No evidence of mitral stenosis. 8. The tricuspid valve is normal in structure. Tricuspid valve regurgitation is trivial. 9. The aortic valve is tricuspid. Aortic valve regurgitation is mild. Mild aortic valve sclerosis without stenosis. 10. The pulmonic valve was not well visualized. Pulmonic valve regurgitation is not visualized. 11. Aortic dilatation noted. 12. 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 13. The inferior vena  cava is normal in size with greater than 50% respiratory variability, suggesting right atrial pressure of 3 mmHg. 14. Definity used; normal LV systolic function; moderate LVH; moderate to severe dilatation of aortic  root/ascending aorta (5.2 cm; suggest CTA or MRA to further assess); mild AI.   ASSESSMENT & PLAN:    1. Persistent atrial fibrillation - LVEF 50-55%. Symptomatic with activity and SOB. Failed cardioversion. Start amiodarone (reviwed with DOD Dr. Nishan). Follow up in afib clinic with possible cEden Emmsioversion if still in afib.   2.  Thoracic aortic aneurysm - Moderate to severe dilatation of aortic root/ascending aorta to 5.2 cm by echo. (dilatation of the ascending aorta 4.8 cm by chest CT on 08/2018).   3. CAD - No angina. Continue ASA and Zetia.   4. HTN - BP stable on current medications.   5. HLD - No Zetia. Statin intolerance.     Medication Adjustments/Labs and Tests Ordered: Current medicines are reviewed at length with the patient today.  Concerns regarding medicines are outlined above.  Medication changes, Labs and Tests ordered today are listed in the Patient Instructions below. Patient Instructions  Medication Instructions:  START: Amiodarone 200 mg  twice a day for 7 days then decrease to 1 tablet once a day  *If you need a refill on your cardiac medications before your next appointment, please call your pharmacy*  Lab Work: None ordered If you have labs (blood work) drawn today and your tests are completely normal, you will receive your results only by:  MyChart Message (if you have MyChart) OR  A paper copy in the mail If you have any lab test that is abnormal or we need to change your treatment, we will call you to review the results.  Testing/Procedures: None ordered   Follow-Up: You are scheduled to see Dr. Clifton Edna on 05/11/2019 @ 9:00 AM   Follow up with the A-fib clinic in 3 weeks   Other Instructions *none ordered      Lorelei Pont, [name]  03/18/2019 1:38 PM    [facility] [address] [place], [place], [place]  [address] Phone: [phone]; Fax: [phone]

## 2019-03-18 NOTE — Telephone Encounter (Signed)
Pt saw Robbie Lis, PA today 03/18/19, last labs 03/03/19 Creat 1.01, age 78, weight 130.7kg, based on specified criteria pt is on an appropriate dosage of Eliquis 5mg  BID.  Will refill rx.

## 2019-03-27 ENCOUNTER — Ambulatory Visit: Payer: Medicare Other | Admitting: Physician Assistant

## 2019-04-07 ENCOUNTER — Ambulatory Visit (HOSPITAL_COMMUNITY)
Admission: RE | Admit: 2019-04-07 | Discharge: 2019-04-07 | Disposition: A | Discharge status: Home / Self Care | Payer: Medicare Other | Source: Ambulatory Visit | Attending: Physician Assistant | Admitting: Physician Assistant

## 2019-04-07 ENCOUNTER — Other Ambulatory Visit: Payer: Self-pay

## 2019-04-07 ENCOUNTER — Other Ambulatory Visit (HOSPITAL_COMMUNITY): Payer: Self-pay | Admitting: *Deleted

## 2019-04-07 ENCOUNTER — Encounter (HOSPITAL_COMMUNITY): Payer: Self-pay | Admitting: Physician Assistant

## 2019-04-07 VITALS — BP 112/70 | HR 70 | Ht 79.0 in | Wt 287.2 lb

## 2019-04-07 DIAGNOSIS — I10 Essential (primary) hypertension: Secondary | ICD-10-CM | POA: Diagnosis not present

## 2019-04-07 DIAGNOSIS — Z7901 Long term (current) use of anticoagulants: Secondary | ICD-10-CM | POA: Diagnosis not present

## 2019-04-07 DIAGNOSIS — Z6832 Body mass index (BMI) 32.0-32.9, adult: Secondary | ICD-10-CM | POA: Diagnosis not present

## 2019-04-07 DIAGNOSIS — I4819 Other persistent atrial fibrillation: Secondary | ICD-10-CM

## 2019-04-07 DIAGNOSIS — Z955 Presence of coronary angioplasty implant and graft: Secondary | ICD-10-CM | POA: Diagnosis not present

## 2019-04-07 DIAGNOSIS — E669 Obesity, unspecified: Secondary | ICD-10-CM | POA: Insufficient documentation

## 2019-04-07 DIAGNOSIS — R0789 Other chest pain: Secondary | ICD-10-CM

## 2019-04-07 DIAGNOSIS — I2510 Atherosclerotic heart disease of native coronary artery without angina pectoris: Secondary | ICD-10-CM | POA: Diagnosis not present

## 2019-04-07 DIAGNOSIS — I4891 Unspecified atrial fibrillation: Secondary | ICD-10-CM | POA: Diagnosis present

## 2019-04-07 DIAGNOSIS — D6869 Other thrombophilia: Secondary | ICD-10-CM

## 2019-04-07 DIAGNOSIS — Z79899 Other long term (current) drug therapy: Secondary | ICD-10-CM | POA: Insufficient documentation

## 2019-04-07 LAB — BASIC METABOLIC PANEL
Anion gap: 12 (ref 5–15)
BUN: 20 mg/dL (ref 8–23)
CO2: 20 mmol/L — ABNORMAL LOW (ref 22–32)
Calcium: 8.8 mg/dL — ABNORMAL LOW (ref 8.9–10.3)
Chloride: 107 mmol/L (ref 98–111)
Creatinine, Ser: 1.11 mg/dL (ref 0.61–1.24)
GFR calc Af Amer: >60 mL/min (ref >60)
GFR calc non Af Amer: >60 mL/min (ref >60)
Glucose, Bld: 223 mg/dL — ABNORMAL HIGH (ref 70–99)
Potassium: 4 mmol/L (ref 3.5–5.1)
Sodium: 139 mmol/L (ref 135–145)

## 2019-04-07 LAB — CBC
HCT: 47.3 % (ref 39.0–52.0)
Hemoglobin: 15.3 g/dL (ref 13.0–17.0)
MCH: 30.7 pg (ref 26.0–34.0)
MCHC: 32.3 g/dL (ref 30.0–36.0)
MCV: 95 fL (ref 80.0–100.0)
Platelets: 193 10*3/uL (ref 150–400)
RBC: 4.98 MIL/uL (ref 4.22–5.81)
RDW: 12.2 % (ref 11.5–15.5)
WBC: 5.8 10*3/uL (ref 4.0–10.5)
nRBC: 0 % (ref 0.0–0.2)

## 2019-04-07 MED ORDER — NITROGLYCERIN 0.4 MG SL SUBL: 0.4000 mg | SUBLINGUAL_TABLET | SUBLINGUAL | 1 refills | Status: DC | PRN

## 2019-04-07 NOTE — Progress Notes (Signed)
Primary Care Physician: [name], MD Primary Cardiologist: Dr Clifton Eugine Primary Electrophysiologist: none Referring Physician: Dr Herbert Pun is a 78 y.o. male with a history of CAD s/p stent RCA 2004, HTN, HLD, thoracic aortic aneurysm(followed by Dr. Tyrone Sage), and persistent atrial fibrillation who presents for follow up in the [facility].  The patient was initially diagnosed with atrial fibrillation on 02/17/19 after presenting with symptoms of SOB and was found to be in rate controlled afib. He was started on Eliquis for a CHADS2VASC score of 4. He underwent DCCV on 03/13/19. He reports that he had some minimal improvement in his symptoms of dyspnea but it is still persistent. He denies significant snoring or alcohol use.  On follow up today, patient was back in afib on follow up and was loaded on amiodarone. Patient is tolerating medication without issue but remains in rate controlled afib with symptoms of dyspnea.   Today, he denies symptoms of palpitations, chest pain, orthopnea, PND, lower extremity edema, dizziness, presyncope, syncope, snoring, daytime somnolence, bleeding, or neurologic sequela. The patient is tolerating medications without difficulties and is otherwise without complaint today.    Atrial Fibrillation Risk Factors:  he does not have symptoms or diagnosis of sleep apnea. he does not have a history of rheumatic fever. he does not have a history of alcohol use. The patient does have a history of early familial atrial fibrillation or other arrhythmias. Mother had afib.  he has a BMI of Body mass index is 32.35 kg/m.Marland Kitchen Filed Weights   04/07/19 0908  Weight: 130.3 kg    Family History  Problem Relation Age of Onset  . Heart disease Father   . Heart attack Father        95  . Cancer Mother 52       LUNG  . Hypertension Neg Hx   . Stroke Neg Hx      Atrial Fibrillation Management history:  Previous  antiarrhythmic drugs: amiodarone Previous cardioversions: 03/13/19 Previous ablations: none CHADS2VASC score: 4 Anticoagulation history: Eliquis   Past Medical History:  Diagnosis Date  . Arthritis   . Coronary artery disease    Stent 2004  . GERD (gastroesophageal reflux disease)   . History of echocardiogram    Echo 5/17: mild LVH, EF 50-55%, no RWMA, Gr 2 DD, mild to mod AI, PASP 32 mmHg  . History of nuclear stress test    Myoview 5/17 - EF 48%, no scar, no ischemia. Low Risk  . History of pneumonia    78 years old  . Hx of Bell's palsy    left face  . Hypercholesteremia    managed by dr Waynard Edwards  . Hypertension   . 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   . Lung nodules    noted on past CT scans  . Malaria   . SVT (supraventricular tachycardia) (HCC)    remote episode during exercise test  . Thoracic aortic aneurysm [facility])    last scan in Jan 2013. Now measuring 5.0cm. Referred to Dr. Tyrone Sage  . Wears dentures    bottom  . Wears glasses    Past Surgical History:  Procedure Laterality Date  . CARDIOVERSION N/A 03/13/2019   Procedure: CARDIOVERSION;  Surgeon: Pricilla Riffle, MD;  Location: [facility] ENDOSCOPY;  Service: Cardiovascular;  Laterality: N/A;  . CERVICAL  FUSION  03-2010   c2-c7  . COLONOSCOPY     x4  . CORONARY STENT PLACEMENT  2004  . dental implant     x 2  . ELBOW SURGERY  1997   lt and rt  . EYE SURGERY    . HIP PINNING,CANNULATED Right 05/11/2014   Procedure: CANNULATED HIP PINNING;  Surgeon: Timothy D Murphy, MD;  Location: [facility] OR;  Service: Orthopedics;  Laterality: Right;  . RETINAL DETACHMENT SURGERY  1975   lt  . rotator cuff surgery  1997   lt  . SHOULDER ARTHROSCOPY  2/15   right-got cardiac clearance-gsc  . TONSILLECTOMY    . TRIGGER FINGER RELEASE Left 10/12/2013   Procedure: LEFT LONG FINGER TRIGGER RELEASE;  Surgeon: David A Thompson, MD;  Location:  [facility];  Service: Orthopedics;  Laterality: Left;    Current Outpatient Medications  Medication Sig Dispense Refill  . amiodarone (PACERONE) 200 MG tablet Take 1 tablet (200 mg total) by mouth daily. 90 tablet 3  . apixaban (ELIQUIS) 5 MG TABS tablet Take 1 tablet (5 mg total) by mouth 2 (two) times daily. 60 tablet 5  . aspirin EC 81 MG tablet Take 1 tablet (81 mg total) by mouth daily. 90 tablet 3  . chlorthalidone (HYGROTON) 25 MG tablet Take 12.5 mg by mouth every other day.     . clobetasol ointment (TEMOVATE) 0.05 % Apply 1 application topically 2 (two) times daily as needed (irritation).    . Coenzyme Q10 (COQ-10) 200 MG CAPS Take 200 mg by mouth every morning.     . colesevelam (WELCHOL) 625 MG tablet Take 3,750 mg by mouth daily after supper. 6 tablets    . ezetimibe (ZETIA) 10 MG tablet Take 5 mg by mouth daily. Takes half tab of 10mg    . Glucosamine-Chondroit-Vit C-Mn (GLUCOSAMINE CHONDR 1500 COMPLX) CAPS Take 2 capsules by mouth every morning.     . hydrocortisone 2.5 % cream Apply 1 application topically as needed (RASH). Use on head    . ketoconazole (NIZORAL) 2 % cream Apply 1 application topically daily as needed for irritation.    . lisinopril (PRINIVIL,ZESTRIL) 40 MG tablet Take 40 mg by mouth daily after supper.     . LIVALO 4 MG TABS Take 2 mg by mouth at bedtime as needed.     . Multiple Vitamin (MULTIVITAMIN WITH MINERALS) TABS tablet Take 1 tablet by mouth every morning.     . nitroGLYCERIN (NITROSTAT) 0.4 MG SL tablet Place 1 tablet (0.4 mg total) under the tongue every 5 (five) minutes as needed for chest pain. 25 tablet 11  . omeprazole (PRILOSEC) 20 MG capsule Take 20 mg by mouth every morning.      No current facility-administered medications for this encounter.    Allergies  Allergen Reactions  . Promethazine Hcl Other (See Comments)    Agitation and incoherence (phenergan)  . Statins Other (See Comments)    myalgias  . Vesicare  [Solifenacin Succinate] Itching and Rash  . Quinolones     Patient was warned about not using Cipro and similar antibiotics. Recent studies have raised concern that fluoroquinolone antibiotics could be associated with an increased risk of aortic aneurysm Fluoroquinolones have non-antimicrobial properties that might jeopardise the integrity of the extracellular matrix of the vascular wall In a  propensity score matched cohort study in [place], there was a 66% increased rate of aortic aneurysm or dissection associated with oral fluoroquinolone use, compared wit      Social History   Socioeconomic History  . Marital status: Married    Spouse name: Not on file  . Number of children: 1  . Years of education: Not on file  . Highest education level: Not on file  Occupational History  . Occupation: retired Games developer: RETIRED  Tobacco Use  . Smoking status: Former Smoker    Quit date: 04/09/1969    Years since quitting: 50.0  . Smokeless tobacco: Current User    Types: Chew  . Tobacco comment: reports that when he smoked it was intermittent  Substance and Sexual Activity  . Alcohol use: Yes    Alcohol/week: 1.0 standard drinks    Types: 1 Standard drinks or equivalent per week    Comment: occasional mainly during holiday  . Drug use: No  . Sexual activity: Yes  Other Topics Concern  . Not on file  Social History Narrative  . Not on file   Social Determinants of Health   Financial Resource Strain:   . Difficulty of Paying Living Expenses: Not on file  Food Insecurity:   . Worried About Charity fundraiser in the Last Year: Not on file  . Ran Out of Food in the Last Year: Not on file  Transportation Needs:   . Lack of Transportation (Medical): Not on file  . Lack of Transportation (Non-Medical): Not on file  Physical Activity:   . Days of Exercise per Week: Not on file  . Minutes of Exercise per Session: Not on file  Stress:   . Feeling of Stress : Not on file    Social Connections:   . Frequency of Communication with Friends and Family: Not on file  . Frequency of Social Gatherings with Friends and Family: Not on file  . Attends Religious Services: Not on file  . Active Member of Clubs or Organizations: Not on file  . Attends Archivist Meetings: Not on file  . Marital Status: Not on file  Intimate Partner Violence:   . Fear of Current or Ex-Partner: Not on file  . Emotionally Abused: Not on file  . Physically Abused: Not on file  . Sexually Abused: Not on file     ROS- All systems are reviewed and negative except as per the HPI above.  Physical Exam: Vitals:   04/07/19 0908  BP: 112/70  Pulse: 70  Weight: 130.3 kg  Height: 6\' 7"  (2.007 m)    GEN- The patient is well appearing obese male, alert and oriented x 3 today.   HEENT-head normocephalic, atraumatic, sclera clear, conjunctiva pink, hearing intact, trachea midline. Lungs- Clear to ausculation bilaterally, normal work of breathing Heart- irregular rate and rhythm, no murmurs, rubs or gallops  GI- soft, NT, ND, + BS Extremities- no clubbing, cyanosis, or edema MS- no significant deformity or atrophy Skin- no rash or lesion Psych- euthymic mood, full affect Neuro- strength and sensation are intact   Wt Readings from Last 3 Encounters:  04/07/19 130.3 kg  03/18/19 130.7 kg  03/16/19 130.4 kg    EKG today demonstrates afib HR 70, LAFB, QRS 120, QTc 442  Echo 02/26/19 demonstrated   1. Left ventricular ejection fraction, by visual estimation, is 50 to 55%. The left ventricle has normal function. There is moderately increased left ventricular hypertrophy.  2. Definity contrast agent was given IV to delineate the left ventricular endocardial borders.  3. Left ventricular diastolic function could not be evaluated.  4. Global right ventricle has  normal systolic function.The right ventricular size is normal.  5. Left atrial size was normal.  6. Right atrial size was  normal.  7. The mitral valve is normal in structure. Trace mitral valve regurgitation. No evidence of mitral stenosis.  8. The tricuspid valve is normal in structure. Tricuspid valve regurgitation is trivial.  9. The aortic valve is tricuspid. Aortic valve regurgitation is mild. Mild aortic valve sclerosis without stenosis. 10. The pulmonic valve was not well visualized. Pulmonic valve regurgitation is not visualized. 11. Aortic dilatation noted. 12. 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 13. The inferior vena cava is normal in size with greater than 50% respiratory variability, suggesting right atrial pressure of 3 mmHg. 14. Definity used; normal LV systolic function; moderate LVH; moderate to severe dilatation of aortic root/ascending aorta (5.2 cm; suggest CTA or MRA to further assess); mild AI.  LA 4.8 cm  Epic records are reviewed at length today  Assessment and Plan:  1. Persistent atrial fibrillation S/p DCCV on 03/13/19. We discussed therapeutic options including repeat DCCV. After discussing the risks and benefits, will arrange DCCV now that he has loaded on amiodarone.  Check Bmet,CBC today Continue amiodarone 200 mg daily.  Continue Eliquis 5 mg BID He has been intolerant of BB in the past 2/2 bradycardia.  This patients CHA2DS2-VASc Score and unadjusted Ischemic Stroke Rate (% per year) is equal to 4.8 % stroke rate/year from a score of 4  Above score calculated as 1 point each if present [CHF, HTN, DM, Vascular=MI/PAD/Aortic Plaque, Age if 65-74, or Male] Above score calculated as 2 points each if present [Age > 75, or Stroke/TIA/TE]   2. Obesity Body mass index is 32.35 kg/m. Lifestyle modification was discussed and encouraged including regular physical activity and weight reduction.   3. HTN Stable, no changes today.  5. CAD S/p RCA stenting 2004. Followed by Dr Clifton JamesMcAlhany   Follow up in the AF  clinic one week post DCCV.   Jorja Loaicky Georgina Krist PA-C [facility] [address] [place], [place] [address] [phone] 04/07/2019 9:25 AM

## 2019-04-07 NOTE — H&P (View-Only) (Signed)
Primary Care Physician: [name], MD Primary Cardiologist: Dr Clifton Phillippe Primary Electrophysiologist: none Referring Physician: Dr Herbert Pun is a 78 y.o. male with a history of CAD s/p stent RCA 2004, HTN, HLD, thoracic aortic aneurysm(followed by Dr. Tyrone Sage), and persistent atrial fibrillation who presents for follow up in the [facility].  The patient was initially diagnosed with atrial fibrillation on 02/17/19 after presenting with symptoms of SOB and was found to be in rate controlled afib. He was started on Eliquis for a CHADS2VASC score of 4. He underwent DCCV on 03/13/19. He reports that he had some minimal improvement in his symptoms of dyspnea but it is still persistent. He denies significant snoring or alcohol use.  On follow up today, patient was back in afib on follow up and was loaded on amiodarone. Patient is tolerating medication without issue but remains in rate controlled afib with symptoms of dyspnea.   Today, he denies symptoms of palpitations, chest pain, orthopnea, PND, lower extremity edema, dizziness, presyncope, syncope, snoring, daytime somnolence, bleeding, or neurologic sequela. The patient is tolerating medications without difficulties and is otherwise without complaint today.    Atrial Fibrillation Risk Factors:  he does not have symptoms or diagnosis of sleep apnea. he does not have a history of rheumatic fever. he does not have a history of alcohol use. The patient does have a history of early familial atrial fibrillation or other arrhythmias. Mother had afib.  he has a BMI of Body mass index is 32.35 kg/m.Marland Kitchen Filed Weights   04/07/19 0908  Weight: 130.3 kg    Family History  Problem Relation Age of Onset  . Heart disease Father   . Heart attack Father        95  . Cancer Mother 52       LUNG  . Hypertension Neg Hx   . Stroke Neg Hx      Atrial Fibrillation Management history:  Previous  antiarrhythmic drugs: amiodarone Previous cardioversions: 03/13/19 Previous ablations: none CHADS2VASC score: 4 Anticoagulation history: Eliquis   Past Medical History:  Diagnosis Date  . Arthritis   . Coronary artery disease    Stent 2004  . GERD (gastroesophageal reflux disease)   . History of echocardiogram    Echo 5/17: mild LVH, EF 50-55%, no RWMA, Gr 2 DD, mild to mod AI, PASP 32 mmHg  . History of nuclear stress test    Myoview 5/17 - EF 48%, no scar, no ischemia. Low Risk  . History of pneumonia    78 years old  . Hx of Bell's palsy    left face  . Hypercholesteremia    managed by dr Waynard Edwards  . Hypertension   . 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   . Lung nodules    noted on past CT scans  . Malaria   . SVT (supraventricular tachycardia) (HCC)    remote episode during exercise test  . Thoracic aortic aneurysm [facility])    last scan in Jan 2013. Now measuring 5.0cm. Referred to Dr. Tyrone Sage  . Wears dentures    bottom  . Wears glasses    Past Surgical History:  Procedure Laterality Date  . CARDIOVERSION N/A 03/13/2019   Procedure: CARDIOVERSION;  Surgeon: Pricilla Riffle, MD;  Location: [facility] ENDOSCOPY;  Service: Cardiovascular;  Laterality: N/A;  . CERVICAL  FUSION  03-2010   c2-c7  . COLONOSCOPY     x4  . CORONARY STENT PLACEMENT  2004  . dental implant     x 2  . ELBOW SURGERY  1997   lt and rt  . EYE SURGERY    . HIP PINNING,CANNULATED Right 05/11/2014   Procedure: CANNULATED HIP PINNING;  Surgeon: Sheral Apley, MD;  Location: [facility] OR;  Service: Orthopedics;  Laterality: Right;  . RETINAL DETACHMENT SURGERY  1975   lt  . rotator cuff surgery  1997   lt  . SHOULDER ARTHROSCOPY  2/15   right-got cardiac clearance-gsc  . TONSILLECTOMY    . TRIGGER FINGER RELEASE Left 10/12/2013   Procedure: LEFT LONG FINGER TRIGGER RELEASE;  Surgeon: Jodi Marble, MD;  Location:  [facility];  Service: Orthopedics;  Laterality: Left;    Current Outpatient Medications  Medication Sig Dispense Refill  . amiodarone (PACERONE) 200 MG tablet Take 1 tablet (200 mg total) by mouth daily. 90 tablet 3  . apixaban (ELIQUIS) 5 MG TABS tablet Take 1 tablet (5 mg total) by mouth 2 (two) times daily. 60 tablet 5  . aspirin EC 81 MG tablet Take 1 tablet (81 mg total) by mouth daily. 90 tablet 3  . chlorthalidone (HYGROTON) 25 MG tablet Take 12.5 mg by mouth every other day.     . clobetasol ointment (TEMOVATE) 0.05 % Apply 1 application topically 2 (two) times daily as needed (irritation).    . Coenzyme Q10 (COQ-10) 200 MG CAPS Take 200 mg by mouth every morning.     . colesevelam (WELCHOL) 625 MG tablet Take 3,750 mg by mouth daily after supper. 6 tablets    . ezetimibe (ZETIA) 10 MG tablet Take 5 mg by mouth daily. Takes half tab of     . Glucosamine-Chondroit-Vit C-Mn (GLUCOSAMINE CHONDR 1500 COMPLX) CAPS Take 2 capsules by mouth every morning.     . hydrocortisone 2.5 % cream Apply 1 application topically as needed (RASH). Use on head    . ketoconazole (NIZORAL) 2 % cream Apply 1 application topically daily as needed for irritation.    Marland Kitchen lisinopril (PRINIVIL,ZESTRIL) 40 MG tablet Take 40 mg by mouth daily after supper.     Marland Kitchen LIVALO 4 MG TABS Take 2 mg by mouth at bedtime as needed.     . Multiple Vitamin (MULTIVITAMIN WITH MINERALS) TABS tablet Take 1 tablet by mouth every morning.     . nitroGLYCERIN (NITROSTAT) 0.4 MG SL tablet Place 1 tablet (0.4 mg total) under the tongue every 5 (five) minutes as needed for chest pain. 25 tablet 11  . omeprazole (PRILOSEC) 20 MG capsule Take 20 mg by mouth every morning.      No current facility-administered medications for this encounter.    Allergies  Allergen Reactions  . Promethazine Hcl Other (See Comments)    Agitation and incoherence (phenergan)  . Statins Other (See Comments)    myalgias  . Vesicare  [Solifenacin Succinate] Itching and Rash  . Quinolones     Patient was warned about not using Cipro and similar antibiotics. Recent studies have raised concern that fluoroquinolone antibiotics could be associated with an increased risk of aortic aneurysm Fluoroquinolones have non-antimicrobial properties that might jeopardise the integrity of the extracellular matrix of the vascular wall In a  propensity score matched cohort study in [place], there was a 66% increased rate of aortic aneurysm or dissection associated with oral fluoroquinolone use, compared wit  Social History   Socioeconomic History  . Marital status: Married    Spouse name: Not on file  . Number of children: 1  . Years of education: Not on file  . Highest education level: Not on file  Occupational History  . Occupation: retired Games developer: RETIRED  Tobacco Use  . Smoking status: Former Smoker    Quit date: 04/09/1969    Years since quitting: 50.0  . Smokeless tobacco: Current User    Types: Chew  . Tobacco comment: reports that when he smoked it was intermittent  Substance and Sexual Activity  . Alcohol use: Yes    Alcohol/week: 1.0 standard drinks    Types: 1 Standard drinks or equivalent per week    Comment: occasional mainly during holiday  . Drug use: No  . Sexual activity: Yes  Other Topics Concern  . Not on file  Social History Narrative  . Not on file   Social Determinants of Health   Financial Resource Strain:   . Difficulty of Paying Living Expenses: Not on file  Food Insecurity:   . Worried About Charity fundraiser in the Last Year: Not on file  . Ran Out of Food in the Last Year: Not on file  Transportation Needs:   . Lack of Transportation (Medical): Not on file  . Lack of Transportation (Non-Medical): Not on file  Physical Activity:   . Days of Exercise per Week: Not on file  . Minutes of Exercise per Session: Not on file  Stress:   . Feeling of Stress : Not on file    Social Connections:   . Frequency of Communication with Friends and Family: Not on file  . Frequency of Social Gatherings with Friends and Family: Not on file  . Attends Religious Services: Not on file  . Active Member of Clubs or Organizations: Not on file  . Attends Archivist Meetings: Not on file  . Marital Status: Not on file  Intimate Partner Violence:   . Fear of Current or Ex-Partner: Not on file  . Emotionally Abused: Not on file  . Physically Abused: Not on file  . Sexually Abused: Not on file     ROS- All systems are reviewed and negative except as per the HPI above.  Physical Exam: Vitals:   04/07/19 0908  BP: 112/70  Pulse: 70  Weight: 130.3 kg  Height: 6\' 7"  (2.007 m)    GEN- The patient is well appearing obese male, alert and oriented x 3 today.   HEENT-head normocephalic, atraumatic, sclera clear, conjunctiva pink, hearing intact, trachea midline. Lungs- Clear to ausculation bilaterally, normal work of breathing Heart- irregular rate and rhythm, no murmurs, rubs or gallops  GI- soft, NT, ND, + BS Extremities- no clubbing, cyanosis, or edema MS- no significant deformity or atrophy Skin- no rash or lesion Psych- euthymic mood, full affect Neuro- strength and sensation are intact   Wt Readings from Last 3 Encounters:  04/07/19 130.3 kg  03/18/19 130.7 kg  03/16/19 130.4 kg    EKG today demonstrates afib HR 70, LAFB, QRS 120, QTc 442  Echo 02/26/19 demonstrated   1. Left ventricular ejection fraction, by visual estimation, is 50 to 55%. The left ventricle has normal function. There is moderately increased left ventricular hypertrophy.  2. Definity contrast agent was given IV to delineate the left ventricular endocardial borders.  3. Left ventricular diastolic function could not be evaluated.  4. Global right ventricle has  normal systolic function.The right ventricular size is normal.  5. Left atrial size was normal.  6. Right atrial size was  normal.  7. The mitral valve is normal in structure. Trace mitral valve regurgitation. No evidence of mitral stenosis.  8. The tricuspid valve is normal in structure. Tricuspid valve regurgitation is trivial.  9. The aortic valve is tricuspid. Aortic valve regurgitation is mild. Mild aortic valve sclerosis without stenosis. 10. The pulmonic valve was not well visualized. Pulmonic valve regurgitation is not visualized. 11. Aortic dilatation noted. 12. 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 13. The inferior vena cava is normal in size with greater than 50% respiratory variability, suggesting right atrial pressure of 3 mmHg. 14. Definity used; normal LV systolic function; moderate LVH; moderate to severe dilatation of aortic root/ascending aorta (5.2 cm; suggest CTA or MRA to further assess); mild AI.  LA 4.8 cm  Epic records are reviewed at length today  Assessment and Plan:  1. Persistent atrial fibrillation S/p DCCV on 03/13/19. We discussed therapeutic options including repeat DCCV. After discussing the risks and benefits, will arrange DCCV now that he has loaded on amiodarone.  Check Bmet,CBC today Continue amiodarone 200 mg daily.  Continue Eliquis 5 mg BID He has been intolerant of BB in the past 2/2 bradycardia.  This patients CHA2DS2-VASc Score and unadjusted Ischemic Stroke Rate (% per year) is equal to 4.8 % stroke rate/year from a score of 4  Above score calculated as 1 point each if present [CHF, HTN, DM, Vascular=MI/PAD/Aortic Plaque, Age if 65-74, or Male] Above score calculated as 2 points each if present [Age > 75, or Stroke/TIA/TE]   2. Obesity Body mass index is 32.35 kg/m. Lifestyle modification was discussed and encouraged including regular physical activity and weight reduction.   3. HTN Stable, no changes today.  5. CAD S/p RCA stenting 2004. Followed by Dr Clifton JamesMcAlhany   Follow up in the AF  clinic one week post DCCV.   Jorja Loaicky Asmaa Tirpak PA-C [facility] [address] [place], [place] [address] [phone] 04/07/2019 9:25 AM

## 2019-04-07 NOTE — Patient Instructions (Addendum)
Cardioversion scheduled for   - Arrive at the [facility] and go to admitting at  -Do not eat or drink anything after midnight the night prior to your procedure.  - Take all your medication with a sip of water prior to arrival.  - You will not be able to drive home after your procedure.  You are scheduled for a Cardioversion on Wed 04/15/2019 with Dr. Acie Fredrickson.  Please arrive at the [facility] (Main Entrance A) at [facility]: [address] [place], [place] [address] at 9:00 am.  DIET: Nothing to eat or drink after midnight except a sip of water with medications (see medication instructions below)  Medication Instructions: Continue your anticoagulant: Eliquis You will need to continue your anticoagulant after your procedure until you  are told by your  Provider that it is safe to stop   COVID:  Sat 04/11/2019 at 10:50 am  You must have a responsible person to drive you home and stay in the waiting area during your procedure. Failure to do so could result in cancellation.  Bring your insurance cards.  *Special Note: Every effort is made to have your procedure done on time. Occasionally there are emergencies that occur at the hospital that may cause delays. Please be patient if a delay does occur.    Follow up with [place] 1 week after the cardioversion

## 2019-04-11 ENCOUNTER — Other Ambulatory Visit (HOSPITAL_COMMUNITY)
Admission: RE | Admit: 2019-04-11 | Discharge: 2019-04-11 | Disposition: A | Discharge status: Home / Self Care | Payer: Medicare PPO | Source: Ambulatory Visit | Attending: Cardiovascular Disease | Admitting: Cardiovascular Disease

## 2019-04-11 DIAGNOSIS — Z01812 Encounter for preprocedural laboratory examination: Secondary | ICD-10-CM | POA: Diagnosis present

## 2019-04-11 DIAGNOSIS — Z20822 Contact with and (suspected) exposure to covid-19: Secondary | ICD-10-CM | POA: Insufficient documentation

## 2019-04-12 LAB — NOVEL CORONAVIRUS, NAA (HOSP ORDER, SEND-OUT TO REF LAB; TAT 18-24 HRS): SARS-CoV-2, NAA: NOT DETECTED

## 2019-04-14 NOTE — Progress Notes (Signed)
Pre- op endo call done, patient states they have remained in quarantine since COVID test. All questions addressed, patient instructed to be here at 0900.

## 2019-04-15 ENCOUNTER — Ambulatory Visit (HOSPITAL_COMMUNITY): Payer: Medicare PPO | Admitting: Certified Registered"

## 2019-04-15 ENCOUNTER — Other Ambulatory Visit: Payer: Self-pay

## 2019-04-15 ENCOUNTER — Ambulatory Visit (HOSPITAL_COMMUNITY)
Admission: RE | Admit: 2019-04-15 | Discharge: 2019-04-15 | Disposition: A | Discharge status: Home / Self Care | Payer: Medicare PPO | Attending: Cardiovascular Disease | Admitting: Cardiovascular Disease

## 2019-04-15 ENCOUNTER — Encounter (HOSPITAL_COMMUNITY)
Admission: RE | Disposition: A | Discharge status: Home / Self Care | Payer: Self-pay | Source: Home / Self Care | Attending: Cardiovascular Disease

## 2019-04-15 ENCOUNTER — Encounter (HOSPITAL_COMMUNITY): Payer: Self-pay | Admitting: Cardiovascular Disease

## 2019-04-15 DIAGNOSIS — Z7982 Long term (current) use of aspirin: Secondary | ICD-10-CM | POA: Insufficient documentation

## 2019-04-15 DIAGNOSIS — I712 Thoracic aortic aneurysm, without rupture: Secondary | ICD-10-CM | POA: Insufficient documentation

## 2019-04-15 DIAGNOSIS — Z87891 Personal history of nicotine dependence: Secondary | ICD-10-CM | POA: Insufficient documentation

## 2019-04-15 DIAGNOSIS — E669 Obesity, unspecified: Secondary | ICD-10-CM | POA: Insufficient documentation

## 2019-04-15 DIAGNOSIS — I259 Chronic ischemic heart disease, unspecified: Secondary | ICD-10-CM | POA: Insufficient documentation

## 2019-04-15 DIAGNOSIS — M1990 Unspecified osteoarthritis, unspecified site: Secondary | ICD-10-CM | POA: Insufficient documentation

## 2019-04-15 DIAGNOSIS — E785 Hyperlipidemia, unspecified: Secondary | ICD-10-CM | POA: Insufficient documentation

## 2019-04-15 DIAGNOSIS — Z79899 Other long term (current) drug therapy: Secondary | ICD-10-CM | POA: Insufficient documentation

## 2019-04-15 DIAGNOSIS — Z888 Allergy status to other drugs, medicaments and biological substances status: Secondary | ICD-10-CM | POA: Insufficient documentation

## 2019-04-15 DIAGNOSIS — Z7901 Long term (current) use of anticoagulants: Secondary | ICD-10-CM | POA: Diagnosis not present

## 2019-04-15 DIAGNOSIS — I4819 Other persistent atrial fibrillation: Secondary | ICD-10-CM | POA: Insufficient documentation

## 2019-04-15 DIAGNOSIS — I2510 Atherosclerotic heart disease of native coronary artery without angina pectoris: Secondary | ICD-10-CM | POA: Insufficient documentation

## 2019-04-15 DIAGNOSIS — Z955 Presence of coronary angioplasty implant and graft: Secondary | ICD-10-CM | POA: Insufficient documentation

## 2019-04-15 DIAGNOSIS — K219 Gastro-esophageal reflux disease without esophagitis: Secondary | ICD-10-CM | POA: Insufficient documentation

## 2019-04-15 DIAGNOSIS — I10 Essential (primary) hypertension: Secondary | ICD-10-CM | POA: Diagnosis not present

## 2019-04-15 DIAGNOSIS — Z6832 Body mass index (BMI) 32.0-32.9, adult: Secondary | ICD-10-CM | POA: Diagnosis not present

## 2019-04-15 DIAGNOSIS — Z8249 Family history of ischemic heart disease and other diseases of the circulatory system: Secondary | ICD-10-CM | POA: Diagnosis not present

## 2019-04-15 HISTORY — PX: CARDIOVERSION: SHX1299

## 2019-04-15 SURGERY — CARDIOVERSION
Anesthesia: General

## 2019-04-15 MED ORDER — SODIUM CHLORIDE 0.9 % IV SOLN
INTRAVENOUS | Status: AC | PRN
  Administered 2019-04-15: 500 mL via INTRAVENOUS

## 2019-04-15 MED ORDER — LIDOCAINE 2% (20 MG/ML) 5 ML SYRINGE
INTRAMUSCULAR | Status: DC | PRN
  Administered 2019-04-15: 40 mg via INTRAVENOUS

## 2019-04-15 MED ORDER — PROPOFOL 10 MG/ML IV BOLUS
INTRAVENOUS | Status: DC | PRN
  Administered 2019-04-15: 60 mg via INTRAVENOUS
  Administered 2019-04-15: 40 mg via INTRAVENOUS

## 2019-04-15 NOTE — Interval H&P Note (Signed)
History and Physical Interval Note:  04/15/2019 9:27 AM  Ronald [name]  has presented today for surgery, with the diagnosis of AFIB.  The various methods of treatment have been discussed with the patient and family. After consideration of risks, benefits and other options for treatment, the patient has consented to  Procedure(s): CARDIOVERSION (N/A) as a surgical intervention.  The patient's history has been reviewed, patient examined, no change in status, stable for surgery.  I have reviewed the patient's chart and labs.  Questions were answered to the patient's satisfaction.     Kristeen Miss

## 2019-04-15 NOTE — Anesthesia Procedure Notes (Signed)
Procedure Name: MAC Date/Time: 04/15/2019 9:46 AM Performed by: Amadeo Garnet, CRNA Pre-anesthesia Checklist: Patient identified, Emergency Drugs available, Suction available and Patient being monitored Patient Re-evaluated:Patient Re-evaluated prior to induction Oxygen Delivery Method: Nasal cannula Preoxygenation: Pre-oxygenation with 100% oxygen Induction Type: IV induction Placement Confirmation: positive ETCO2 Dental Injury: Teeth and Oropharynx as per pre-operative assessment

## 2019-04-15 NOTE — CV Procedure (Signed)
    Cardioversion Note  Ronald [name] [id number] 21-Jul-1940  Procedure: DC Cardioversion Indications: atrial fib  Procedure Details Consent: Obtained Time Out: Verified patient identification, verified procedure, site/side was marked, verified correct patient position, special equipment/implants available, Radiology Safety Procedures followed,  medications/allergies/relevent history reviewed, required imaging and test results available.  Performed  The patient has been on adequate anticoagulation.  The patient received IV Lidocaine 40 mg IV followed by Propofol 100 mg  for sedation.  Synchronous cardioversion was performed at  200  joules.  The cardioversion was successful     Complications: No apparent complications Patient did tolerate procedure well.   Vesta Mixer, Montez Hageman., MD, [facility] 04/15/2019, 9:56 AM

## 2019-04-15 NOTE — Transfer of Care (Signed)
Immediate Anesthesia Transfer of Care Note  Patient: Ronald [name]  Procedure(s) Performed: CARDIOVERSION (N/A )  Patient Location: Endoscopy Unit  Anesthesia Type:MAC  Level of Consciousness: awake, alert  and oriented  Airway & Oxygen Therapy: Patient Spontanous Breathing and Patient connected to nasal cannula oxygen  Post-op Assessment: Report given to RN, Post -op Vital signs reviewed and stable and Patient moving all extremities  Post vital signs: Reviewed and stable  Last Vitals:  Vitals Value Taken Time  BP    Temp    Pulse    Resp    SpO2      Last Pain:  Vitals:   04/15/19 0911  TempSrc: Oral  PainSc: 0-No pain         Complications: No apparent anesthesia complications

## 2019-04-15 NOTE — Anesthesia Postprocedure Evaluation (Signed)
Anesthesia Post Note  Patient: Ronald [name]  Procedure(s) Performed: CARDIOVERSION (N/A )     Patient location during evaluation: Endoscopy Anesthesia Type: MAC Level of consciousness: awake and alert Pain management: pain level controlled Vital Signs Assessment: post-procedure vital signs reviewed and stable Respiratory status: spontaneous breathing, nonlabored ventilation, respiratory function stable and patient connected to nasal cannula oxygen Cardiovascular status: stable and blood pressure returned to baseline Postop Assessment: no apparent nausea or vomiting Anesthetic complications: no    Last Vitals:  Vitals:   04/15/19 0955 04/15/19 1005  BP: 116/81 105/70  Pulse: 60 62  Resp: 17 14  Temp:    SpO2: 100% 99%    Last Pain:  Vitals:   04/15/19 0955  TempSrc:   PainSc: 0-No pain                 Lilian Fuhs COKER

## 2019-04-15 NOTE — Anesthesia Preprocedure Evaluation (Signed)
Anesthesia Evaluation  Patient identified by MRN, date of birth, ID band Patient awake    Reviewed: Allergy & Precautions, NPO status , Patient's Chart, lab work & pertinent test results  Airway Mallampati: III  TM Distance: >3 FB Neck ROM: Full    Dental  (+) Teeth Intact   Pulmonary former smoker,    breath sounds clear to auscultation       Cardiovascular hypertension,  Rhythm:Irregular Rate:Normal     Neuro/Psych    GI/Hepatic   Endo/Other    Renal/GU      Musculoskeletal   Abdominal   Peds  Hematology   Anesthesia Other Findings   Reproductive/Obstetrics                             Anesthesia Physical Anesthesia Plan  ASA: III  Anesthesia Plan: General   Post-op Pain Management:    Induction: Intravenous  PONV Risk Score and Plan: Ondansetron and Dexamethasone  Airway Management Planned: Mask  Additional Equipment:   Intra-op Plan:   Post-operative Plan:   Informed Consent: I have reviewed the patients History and Physical, chart, labs and discussed the procedure including the risks, benefits and alternatives for the proposed anesthesia with the patient or authorized representative who has indicated his/her understanding and acceptance.       Plan Discussed with: CRNA and Anesthesiologist  Anesthesia Plan Comments:         Anesthesia Quick Evaluation

## 2019-04-22 ENCOUNTER — Ambulatory Visit (HOSPITAL_COMMUNITY)
Admission: RE | Admit: 2019-04-22 | Discharge: 2019-04-22 | Disposition: A | Discharge status: Home / Self Care | Payer: Medicare PPO | Source: Ambulatory Visit | Attending: Physician Assistant | Admitting: Physician Assistant

## 2019-04-22 ENCOUNTER — Encounter (HOSPITAL_COMMUNITY): Payer: Self-pay | Admitting: Physician Assistant

## 2019-04-22 ENCOUNTER — Other Ambulatory Visit: Payer: Self-pay

## 2019-04-22 VITALS — BP 108/78 | HR 81 | Ht 79.0 in | Wt 283.0 lb

## 2019-04-22 DIAGNOSIS — I2510 Atherosclerotic heart disease of native coronary artery without angina pectoris: Secondary | ICD-10-CM | POA: Diagnosis not present

## 2019-04-22 DIAGNOSIS — Z87891 Personal history of nicotine dependence: Secondary | ICD-10-CM | POA: Insufficient documentation

## 2019-04-22 DIAGNOSIS — Z6831 Body mass index (BMI) 31.0-31.9, adult: Secondary | ICD-10-CM | POA: Insufficient documentation

## 2019-04-22 DIAGNOSIS — I10 Essential (primary) hypertension: Secondary | ICD-10-CM | POA: Insufficient documentation

## 2019-04-22 DIAGNOSIS — K219 Gastro-esophageal reflux disease without esophagitis: Secondary | ICD-10-CM | POA: Insufficient documentation

## 2019-04-22 DIAGNOSIS — Z79899 Other long term (current) drug therapy: Secondary | ICD-10-CM | POA: Diagnosis not present

## 2019-04-22 DIAGNOSIS — Z8249 Family history of ischemic heart disease and other diseases of the circulatory system: Secondary | ICD-10-CM | POA: Diagnosis not present

## 2019-04-22 DIAGNOSIS — D6869 Other thrombophilia: Secondary | ICD-10-CM

## 2019-04-22 DIAGNOSIS — Z7901 Long term (current) use of anticoagulants: Secondary | ICD-10-CM | POA: Insufficient documentation

## 2019-04-22 DIAGNOSIS — E669 Obesity, unspecified: Secondary | ICD-10-CM | POA: Diagnosis not present

## 2019-04-22 DIAGNOSIS — I4819 Other persistent atrial fibrillation: Secondary | ICD-10-CM | POA: Diagnosis present

## 2019-04-22 DIAGNOSIS — Z955 Presence of coronary angioplasty implant and graft: Secondary | ICD-10-CM | POA: Diagnosis not present

## 2019-04-22 DIAGNOSIS — I712 Thoracic aortic aneurysm, without rupture: Secondary | ICD-10-CM | POA: Insufficient documentation

## 2019-04-22 DIAGNOSIS — E785 Hyperlipidemia, unspecified: Secondary | ICD-10-CM | POA: Diagnosis not present

## 2019-04-22 NOTE — Progress Notes (Signed)
Primary Care Physician: [name], MD Primary Cardiologist: Dr Clifton [place] Primary Electrophysiologist: none Referring Physician: Dr Herbert Pun is a 79 y.o. male with a history of CAD s/p stent RCA 2004, HTN, HLD, thoracic aortic aneurysm(followed by Dr. Tyrone Sage), and persistent atrial fibrillation who presents for follow up in the [facility].  The patient was initially diagnosed with atrial fibrillation on 02/17/19 after presenting with symptoms of SOB and was found to be in rate controlled afib. He was started on Eliquis for a CHADS2VASC score of 4. He underwent DCCV on 03/13/19. He reports that he had some minimal improvement in his symptoms of dyspnea but it is still persistent. He denies significant snoring or alcohol use.  On follow up today, patient is s/p DCCV on 04/15/19. Patient reports that he was in SR for 4 days with no symptoms of SOB and greatly increased energy level. Unfortunately, he is now back in afib with dyspnea on minimal exertion.   Today, he denies symptoms of palpitations, chest pain, orthopnea, PND, lower extremity edema, dizziness, presyncope, syncope, snoring, daytime somnolence, bleeding, or neurologic sequela. The patient is tolerating medications without difficulties and is otherwise without complaint today.    Atrial Fibrillation Risk Factors:  he does not have symptoms or diagnosis of sleep apnea. he does not have a history of rheumatic fever. he does not have a history of alcohol use. The patient does have a history of early familial atrial fibrillation or other arrhythmias. Mother had afib.  he has a BMI of Body mass index is 31.88 kg/m.Marland Kitchen Filed Weights   04/22/19 1006  Weight: 128.4 kg    Family History  Problem Relation Age of Onset  . Heart disease Father   . Heart attack Father        25  . Cancer Mother 38       LUNG  . Hypertension Neg Hx   . Stroke Neg Hx      Atrial Fibrillation  Management history:  Previous antiarrhythmic drugs: amiodarone Previous cardioversions: 03/13/19, 04/15/19 Previous ablations: none CHADS2VASC score: 4 Anticoagulation history: Eliquis   Past Medical History:  Diagnosis Date  . Arthritis   . Coronary artery disease    Stent 2004  . GERD (gastroesophageal reflux disease)   . History of echocardiogram    Echo 5/17: mild LVH, EF 50-55%, no RWMA, Gr 2 DD, mild to mod AI, PASP 32 mmHg  . History of nuclear stress test    Myoview 5/17 - EF 48%, no scar, no ischemia. Low Risk  . History of pneumonia    79 years old  . Hx of Bell's palsy    left face  . Hypercholesteremia    managed by dr Waynard Edwards  . Hypertension   . 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   . Lung nodules    noted on past CT scans  . Malaria   . SVT (supraventricular tachycardia) (HCC)    remote episode during exercise test  . Thoracic aortic aneurysm [facility])    last scan in Jan 2013. Now measuring 5.0cm. Referred to Dr. Tyrone Sage  . Wears dentures    bottom  . Wears glasses    Past Surgical History:  Procedure Laterality Date  . CARDIOVERSION N/A 03/13/2019   Procedure: CARDIOVERSION;  Surgeon: Pricilla Riffle, MD;  Location: [facility]  ENDOSCOPY;  Service: Cardiovascular;  Laterality: N/A;  . CARDIOVERSION N/A 04/15/2019   Procedure: CARDIOVERSION;  Surgeon: Thayer Headings, MD;  Location: [place];  Service: Cardiovascular;  Laterality: N/A;  . CERVICAL FUSION  03-2010   c2-c7  . COLONOSCOPY     x4  . CORONARY STENT PLACEMENT  2004  . dental implant     x 2  . [place]   lt and rt  . EYE SURGERY    . HIP PINNING,CANNULATED Right 05/11/2014   Procedure: CANNULATED HIP PINNING;  Surgeon: Renette Butters, MD;  Location: [place];  Service: Orthopedics;  Laterality: Right;  . RETINAL DETACHMENT SURGERY  1975   lt  . rotator cuff surgery  1997   lt  . SHOULDER  ARTHROSCOPY  2/15   right-got cardiac clearance-gsc  . TONSILLECTOMY    . TRIGGER FINGER RELEASE Left 10/12/2013   Procedure: LEFT LONG FINGER TRIGGER RELEASE;  Surgeon: Jolyn Nap, MD;  Location: [place];  Service: Orthopedics;  Laterality: Left;    Current Outpatient Medications  Medication Sig Dispense Refill  . amiodarone (PACERONE) 200 MG tablet Take 1 tablet (200 mg total) by mouth daily. 90 tablet 3  . apixaban (ELIQUIS) 5 MG TABS tablet Take 1 tablet (5 mg total) by mouth 2 (two) times daily. 60 tablet 5  . aspirin EC 81 MG tablet Take 1 tablet (81 mg total) by mouth daily. 90 tablet 3  . chlorthalidone (HYGROTON) 25 MG tablet Take 12.5 mg by mouth every other day.     . clobetasol ointment (TEMOVATE) 7.40 % Apply 1 application topically 2 (two) times daily as needed (itchy scaly on legs).     . Coenzyme Q10 (COQ-10) 200 MG CAPS Take 200 mg by mouth daily.     . colesevelam (WELCHOL) 625 MG tablet Take 3,750 mg by mouth daily after supper. 6 tablets    . ezetimibe (ZETIA) 10 MG tablet Take 5 mg by mouth daily.     . Glucosamine-Chondroit-Vit C-Mn (GLUCOSAMINE CHONDR 1500 COMPLX) CAPS Take 2 capsules by mouth every morning.     . hydrocortisone 2.5 % cream Apply 1 application topically as needed (eye brows flakey). Use on head    . Ivermectin (SOOLANTRA EX) Apply 1 application topically daily as needed (Rosachea).    Marland Kitchen ketoconazole (NIZORAL) 2 % cream Apply 1 application topically daily as needed for irritation (Corner of mouth).     . lisinopril (PRINIVIL,ZESTRIL) 40 MG tablet Take 40 mg by mouth every evening.     Marland Kitchen LIVALO 4 MG TABS Take 2 mg by mouth at bedtime.     . Multiple Vitamin (MULTIVITAMIN WITH MINERALS) TABS tablet Take 1 tablet by mouth every morning.     . mupirocin ointment (BACTROBAN) 2 % Place 1 application into the nose daily as needed (Tic bits).    . nitroGLYCERIN (NITROSTAT) 0.4 MG SL tablet Place 1 tablet (0.4 mg total) under the tongue  every 5 (five) minutes as needed for chest pain. 25 tablet 1  . omeprazole (PRILOSEC) 20 MG capsule Take 20 mg by mouth daily.      No current facility-administered medications for this encounter.    Allergies  Allergen Reactions  . Promethazine Hcl Other (See Comments)    Agitation and incoherence (phenergan)  . Vesicare [Solifenacin Succinate] Itching and Rash  . Quinolones     Patient was warned about not using Cipro and similar antibiotics. Recent studies have raised  concern that fluoroquinolone antibiotics could be associated with an increased risk of aortic aneurysm Fluoroquinolones have non-antimicrobial properties that might jeopardise the integrity of the extracellular matrix of the vascular wall In a  propensity score matched cohort study in [place], there was a 66% increased rate of aortic aneurysm or dissection associated with oral fluoroquinolone use, compared wit    Social History   Socioeconomic History  . Marital status: Married    Spouse name: Not on file  . Number of children: 1  . Years of education: Not on file  . Highest education level: Not on file  Occupational History  . Occupation: retired Advertising account executive: RETIRED  Tobacco Use  . Smoking status: Former Smoker    Quit date: 04/09/1969    Years since quitting: 50.0  . Smokeless tobacco: Current User    Types: Chew  . Tobacco comment: reports that when he smoked it was intermittent  Substance and Sexual Activity  . Alcohol use: Yes    Alcohol/week: 1.0 standard drinks    Types: 1 Standard drinks or equivalent per week    Comment: occasional mainly during holiday  . Drug use: No  . Sexual activity: Yes  Other Topics Concern  . Not on file  Social History Narrative  . Not on file   Social Determinants of Health   Financial Resource Strain:   . Difficulty of Paying Living Expenses: Not on file  Food Insecurity:   . Worried About Programme researcher, broadcasting/film/video in the Last Year: Not on file  . Ran  Out of Food in the Last Year: Not on file  Transportation Needs:   . Lack of Transportation (Medical): Not on file  . Lack of Transportation (Non-Medical): Not on file  Physical Activity:   . Days of Exercise per Week: Not on file  . Minutes of Exercise per Session: Not on file  Stress:   . Feeling of Stress : Not on file  Social Connections:   . Frequency of Communication with Friends and Family: Not on file  . Frequency of Social Gatherings with Friends and Family: Not on file  . Attends Religious Services: Not on file  . Active Member of Clubs or Organizations: Not on file  . Attends Banker Meetings: Not on file  . Marital Status: Not on file  Intimate Partner Violence:   . Fear of Current or Ex-Partner: Not on file  . Emotionally Abused: Not on file  . Physically Abused: Not on file  . Sexually Abused: Not on file     ROS- All systems are reviewed and negative except as per the HPI above.  Physical Exam: Vitals:   04/22/19 1006  BP: 108/78  Pulse: 81  Weight: 128.4 kg  Height: 6\' 7"  (2.007 m)    GEN- The patient is well appearing elderly male, alert and oriented x 3 today.   HEENT-head normocephalic, atraumatic, sclera clear, conjunctiva pink, hearing intact, trachea midline. Lungs- Clear to ausculation bilaterally, normal work of breathing Heart- irregular rate and rhythm, no murmurs, rubs or gallops  GI- soft, NT, ND, + BS Extremities- no clubbing, cyanosis, or edema MS- no significant deformity or atrophy Skin- no rash or lesion Psych- euthymic mood, full affect Neuro- strength and sensation are intact   Wt Readings from Last 3 Encounters:  04/22/19 128.4 kg  04/15/19 127 kg  04/07/19 130.3 kg    EKG today demonstrates afib HR 81, NST, QRS 116, QTc 448  Echo 02/26/19 demonstrated   1. Left ventricular ejection fraction, by visual estimation, is 50 to 55%. The left ventricle has normal function. There is moderately increased left ventricular  hypertrophy.  2. Definity contrast agent was given IV to delineate the left ventricular endocardial borders.  3. Left ventricular diastolic function could not be evaluated.  4. Global right ventricle has normal systolic function.The right ventricular size is normal.  5. Left atrial size was normal.  6. Right atrial size was normal.  7. The mitral valve is normal in structure. Trace mitral valve regurgitation. No evidence of mitral stenosis.  8. The tricuspid valve is normal in structure. Tricuspid valve regurgitation is trivial.  9. The aortic valve is tricuspid. Aortic valve regurgitation is mild. Mild aortic valve sclerosis without stenosis. 10. The pulmonic valve was not well visualized. Pulmonic valve regurgitation is not visualized. 11. Aortic dilatation noted. 12. 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 13. The inferior vena cava is normal in size with greater than 50% respiratory variability, suggesting right atrial pressure of 3 mmHg. 14. Definity used; normal LV systolic function; moderate LVH; moderate to severe dilatation of aortic root/ascending aorta (5.2 cm; suggest CTA or MRA to further assess); mild AI.  LA 4.8 cm  Epic records are reviewed at length today  Assessment and Plan:  1. Persistent atrial fibrillation S/p DCCV on 03/13/19 with ERAF. Loaded on amiodarone and underwent repeat DCCV on 04/15/19. He is back in symptomatic rate controlled afib. He appears to be failing amiodarone. Afib ablation vs dofetilide after 3 month washout of amiodarone discussed with the patient today. He would like to be evaluated for an ablation.  Continue amiodarone 200 mg daily for now. Continue Eliquis 5 mg BID He has been intolerant of BB in the past 2/2 bradycardia.  This patients CHA2DS2-VASc Score and unadjusted Ischemic Stroke Rate (% per year) is equal to 4.8 % stroke rate/year from a score of 4  Above score calculated as  1 point each if present [CHF, HTN, DM, Vascular=MI/PAD/Aortic Plaque, Age if 65-74, or Male] Above score calculated as 2 points each if present [Age > 75, or Stroke/TIA/TE]   2. Obesity Body mass index is 31.88 kg/m. Lifestyle modification was discussed and encouraged including regular physical activity and weight reduction.  3. HTN Stable, no changes today.  5. CAD S/p RCA stenting 2004. No anginal symptoms.  Followed by Dr Clifton Keimari   Follow up with Dr Johney Frame for consideration of ablation. Dr Clifton Joby as scheduled.    [name] PA-C Afib Clinic [facility] [address] [place], [place] [address] [phone] 04/22/2019 10:32 AM

## 2019-04-28 ENCOUNTER — Telehealth: Payer: Self-pay

## 2019-04-29 ENCOUNTER — Telehealth (INDEPENDENT_AMBULATORY_CARE_PROVIDER_SITE_OTHER): Payer: Medicare PPO | Admitting: Internal Medicine

## 2019-04-29 ENCOUNTER — Ambulatory Visit: Payer: Medicare PPO | Attending: Internal Medicine

## 2019-04-29 ENCOUNTER — Telehealth: Payer: Self-pay

## 2019-04-29 ENCOUNTER — Encounter: Payer: Self-pay | Admitting: Internal Medicine

## 2019-04-29 VITALS — BP 108/76 | HR 75 | Ht 79.0 in | Wt 275.0 lb

## 2019-04-29 DIAGNOSIS — I10 Essential (primary) hypertension: Secondary | ICD-10-CM

## 2019-04-29 DIAGNOSIS — I4819 Other persistent atrial fibrillation: Secondary | ICD-10-CM

## 2019-04-29 DIAGNOSIS — I2510 Atherosclerotic heart disease of native coronary artery without angina pectoris: Secondary | ICD-10-CM | POA: Diagnosis not present

## 2019-04-29 DIAGNOSIS — Z23 Encounter for immunization: Secondary | ICD-10-CM | POA: Insufficient documentation

## 2019-04-29 DIAGNOSIS — I4891 Unspecified atrial fibrillation: Secondary | ICD-10-CM

## 2019-04-29 MED ORDER — METOPROLOL TARTRATE 50 MG PO TABS: ORAL_TABLET | ORAL | 0 refills | Status: DC

## 2019-04-29 NOTE — Telephone Encounter (Signed)
-----   Message from Hillis Range, MD sent at 04/29/2019  3:59 PM EST ----- Afib ablation  carto, anesthesia, and ICE  Cardiac CT

## 2019-04-29 NOTE — H&P (View-Only) (Signed)
Electrophysiology TeleHealth Note   Due to national recommendations of social distancing due to COVID 19, Audio/video telehealth visit is felt to be most appropriate for this patient at this time.  See MyChart message from today for patient consent regarding telehealth for [name].   Date:  04/29/2019   ID:  Ronald [name], DOB Dec 11, 1940, MRN [id number]  Location: home  Provider location: [address] [place] Evaluation Performed: New patient consult  PCP:  [name], MD  Cardiologist:  Verne Carrow, MD  Electrophysiologist:  None   Chief Complaint:  afib  History of Present Illness:    Ronald [name] is a 79 y.o. male who presents via audio/video conferencing for a telehealth visit today.   The patient is referred for new consultation regarding afib by [name] and Dr Clifton Jacque.  He was initially diagnosed with afib in November after presenting with SOB and fatigue.  He was started on eliquis and underwent cardioversion 03/13/2019.  He had some improvement in symptoms initially.  He had recurrent afib and was placed on amiodarone.  He underwent repeat cardioversion 04/15/2019 but failed to maintain sinus rhythm. He continues to have SOB and fatigue.  Today, he denies symptoms of palpitations, chest pain,  orthopnea, PND, lower extremity edema, claudication, dizziness, presyncope, syncope, bleeding, or neurologic sequela. The patient is tolerating medications without difficulties and is otherwise without complaint today.   he denies symptoms of cough, fevers, chills, or new SOB worrisome for COVID 19.   Past Medical History:  Diagnosis Date  . Arthritis   . Coronary artery disease    Stent 2004  . GERD (gastroesophageal reflux disease)   . History of echocardiogram    Echo 5/17: mild LVH, EF 50-55%, no RWMA, Gr 2 DD, mild to mod AI, PASP 32 mmHg  . History of nuclear stress test    Myoview 5/17 - EF 48%, no scar, no ischemia. Low Risk  .  History of pneumonia    79 years old  . Hx of Bell's palsy    left face  . Hypercholesteremia    managed by dr Waynard Edwards  . Hypertension   . 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   . Lung nodules    noted on past CT scans  . Malaria   . SVT (supraventricular tachycardia) (HCC)    remote episode during exercise test  . Thoracic aortic aneurysm [facility])    last scan in Jan 2013. Now measuring 5.0cm. Referred to Dr. Tyrone Sage  . Wears dentures    bottom  . Wears glasses     Past Surgical History:  Procedure Laterality Date  . CARDIOVERSION N/A 03/13/2019   Procedure: CARDIOVERSION;  Surgeon: Pricilla Riffle, MD;  Location: [facility] ENDOSCOPY;  Service: Cardiovascular;  Laterality: N/A;  . CARDIOVERSION N/A 04/15/2019   Procedure: CARDIOVERSION;  Surgeon: Vesta Mixer, MD;  Location: [facility] ENDOSCOPY;  Service: Cardiovascular;  Laterality: N/A;  . CERVICAL FUSION  03-2010   c2-c7  . COLONOSCOPY     x4  . CORONARY STENT PLACEMENT  2004  . dental implant     x 2  . ELBOW SURGERY  1997   lt and rt  . EYE SURGERY    . HIP PINNING,CANNULATED Right 05/11/2014   Procedure: CANNULATED HIP PINNING;  Surgeon: Sheral Apley, MD;  Location: [facility]  OR;  Service: Orthopedics;  Laterality: Right;  . RETINAL DETACHMENT SURGERY  1975   lt  . rotator cuff surgery  1997   lt  . SHOULDER ARTHROSCOPY  2/15   right-got cardiac clearance-gsc  . TONSILLECTOMY    . TRIGGER FINGER RELEASE Left 10/12/2013   Procedure: LEFT LONG FINGER TRIGGER RELEASE;  Surgeon: David A Thompson, MD;  Location: [facility];  Service: Orthopedics;  Laterality: Left;    Current Outpatient Medications  Medication Sig Dispense Refill  . amiodarone (PACERONE) 200 MG tablet Take 1 tablet (200 mg total) by mouth daily. 90 tablet 3  . apixaban (ELIQUIS) 5 MG TABS tablet Take 1 tablet (5 mg total) by mouth 2 (two) times daily. 60  tablet 5  . aspirin EC 81 MG tablet Take 1 tablet (81 mg total) by mouth daily. 90 tablet 3  . chlorthalidone (HYGROTON) 25 MG tablet Take 12.5 mg by mouth every other day.     . clobetasol ointment (TEMOVATE) 0.05 % Apply 1 application topically 2 (two) times daily as needed (itchy scaly on legs).     . Coenzyme Q10 (COQ-10) 200 MG CAPS Take 200 mg by mouth daily.     . colesevelam (WELCHOL) 625 MG tablet Take 3,750 mg by mouth daily after supper. 6 tablets    . ezetimibe (ZETIA) 10 MG tablet Take 5 mg by mouth daily.     . Glucosamine-Chondroit-Vit C-Mn (GLUCOSAMINE CHONDR 1500 COMPLX) CAPS Take 2 capsules by mouth every morning.     . hydrocortisone 2.5 % cream Apply 1 application topically as needed (eye brows flakey). Use on head    . Ivermectin (SOOLANTRA EX) Apply 1 application topically daily as needed (Rosachea).    . ketoconazole (NIZORAL) 2 % cream Apply 1 application topically daily as needed for irritation (Corner of mouth).     . lisinopril (PRINIVIL,ZESTRIL) 40 MG tablet Take 40 mg by mouth every evening.     . LIVALO 4 MG TABS Take 2 mg by mouth at bedtime.     . Multiple Vitamin (MULTIVITAMIN WITH MINERALS) TABS tablet Take 1 tablet by mouth every morning.     . mupirocin ointment (BACTROBAN) 2 % Place 1 application into the nose daily as needed (Tic bits).    . nitroGLYCERIN (NITROSTAT) 0.4 MG SL tablet Place 1 tablet (0.4 mg total) under the tongue every 5 (five) minutes as needed for chest pain. 25 tablet 1  . omeprazole (PRILOSEC) 20 MG capsule Take 20 mg by mouth daily.      No current facility-administered medications for this visit.    Allergies:   Promethazine hcl, Vesicare [solifenacin succinate], and Quinolones   Social History:  The patient  reports that he quit smoking about 50 years ago. His smokeless tobacco use includes chew. He reports current alcohol use of about 1.0 standard drinks of alcohol per week. He reports that he does not use drugs.   Family  History:  The patient's family history includes Cancer (age of onset: 89) in his mother; Heart attack in his father; Heart disease in his father.    ROS:  Please see the history of present illness.   All other systems are personally reviewed and negative.    Exam:    Vital Signs:  BP 108/76   Pulse 75   Ht 6' 7" (2.007 m)   Wt 275 lb (124.7 kg)   BMI 30.98 kg/m    Well appearing, alert and conversant, regular work of   breathing,  good skin color Eyes- anicteric, neuro- grossly intact, skin- no apparent rash or lesions or cyanosis, mouth- oral mucosa is pink   Labs/Other Tests and Data Reviewed:    Recent Labs: 02/17/2019: TSH 2.140 04/07/2019: BUN 20; Creatinine, Ser 1.11; Hemoglobin 15.3; Platelets 193; Potassium 4.0; Sodium 139   Wt Readings from Last 3 Encounters:  04/29/19 275 lb (124.7 kg)  04/22/19 283 lb (128.4 kg)  04/15/19 280 lb (127 kg)     Other studies personally reviewed: Additional studies/ records that were reviewed today include: prior echo, AF clinic records  Review of the above records today demonstrates: as above  ASSESSMENT & PLAN:    1.  Persistent atrial fibrillation The patient has symptomatic, recurrent persistent atrial fibrillation. he has failed medical therapy with amiodarone. Chads2vasc score is 4.  he is anticoagulated with eliquis . Therapeutic strategies for afib including medicine and ablation were discussed in detail with the patient today. Risk, benefits, and alternatives to EP study and radiofrequency ablation for afib were also discussed in detail today. These risks include but are not limited to stroke, bleeding, vascular damage, tamponade, perforation, damage to the esophagus, lungs, and other structures, pulmonary vein stenosis, worsening renal function, and death. The patient understands these risk and wishes to proceed.  We will therefore proceed with catheter ablation at the next available time.  Carto, ICE, anesthesia are requested  for the procedure.  Will also obtain cardiac CT prior to the procedure to exclude LAA thrombus and further evaluate atrial anatomy.  Continue eliquis for chads2vasc score of at least 4. Stop aspirin  2. CAD No ischemic symptoms No recent stents Stop ASA  3. HTN Stable No change required today  Current medicines are reviewed at length with the patient today.   The patient does not have concerns regarding his medicines.  The following changes were made today:  none  Labs/ tests ordered today include:  No orders of the defined types were placed in this encounter.   Patient Risk:  after full review of this patients clinical status, I feel that they are at high risk at this time.   Today, I have spent 30 minutes with the patient with telehealth technology discussing afib .    Signed, Thompson Grayer MD, [facility] 04/29/2019 3:54 PM   [place] [address] [phone] (office) [phone] (fax)

## 2019-04-29 NOTE — Progress Notes (Signed)
   Covid-19 Vaccination Clinic  Name:  Ronald [name]    MRN: [id number] DOB: Jun 02, 1940  04/29/2019  [name] was observed post Covid-19 immunization for 15 minutes without incidence. He was provided with Vaccine Information Sheet and instruction to access the V-Safe system.   [name] was instructed to call 911 with any severe reactions post vaccine: Marland Kitchen Difficulty breathing  . Swelling of your face and throat  . A fast heartbeat  . A bad rash all over your body  . Dizziness and weakness    Immunizations Administered    Name Date Dose VIS Date Route   Pfizer COVID-19 Vaccine 04/29/2019  1:42 PM 0.3 mL 03/20/2019 Intramuscular   Manufacturer: [employer], [employer]   Lot: [id number]   NDC: [address]-6

## 2019-04-29 NOTE — Telephone Encounter (Signed)
Outreach made to Pt.  Pt scheduled for afib ablation on 05/12/19 at 7:30 am  Labs/covid test scheduled  Pt will come to office tomorrow for labs and to discuss instructions.  Work up complete.

## 2019-04-29 NOTE — Progress Notes (Signed)
Electrophysiology TeleHealth Note   Due to national recommendations of social distancing due to COVID 19, Audio/video telehealth visit is felt to be most appropriate for this patient at this time.  See MyChart message from today for patient consent regarding telehealth for [name].   Date:  04/29/2019   ID:  Ronald [name], DOB Dec 11, 1940, MRN [id number]  Location: home  Provider location: [address] [place] Evaluation Performed: New patient consult  PCP:  [name], MD  Cardiologist:  Verne Carrow, MD  Electrophysiologist:  None   Chief Complaint:  afib  History of Present Illness:    Ronald [name] is a 79 y.o. male who presents via audio/video conferencing for a telehealth visit today.   The patient is referred for new consultation regarding afib by [name] and Dr Clifton Jacque.  He was initially diagnosed with afib in November after presenting with SOB and fatigue.  He was started on eliquis and underwent cardioversion 03/13/2019.  He had some improvement in symptoms initially.  He had recurrent afib and was placed on amiodarone.  He underwent repeat cardioversion 04/15/2019 but failed to maintain sinus rhythm. He continues to have SOB and fatigue.  Today, he denies symptoms of palpitations, chest pain,  orthopnea, PND, lower extremity edema, claudication, dizziness, presyncope, syncope, bleeding, or neurologic sequela. The patient is tolerating medications without difficulties and is otherwise without complaint today.   he denies symptoms of cough, fevers, chills, or new SOB worrisome for COVID 19.   Past Medical History:  Diagnosis Date  . Arthritis   . Coronary artery disease    Stent 2004  . GERD (gastroesophageal reflux disease)   . History of echocardiogram    Echo 5/17: mild LVH, EF 50-55%, no RWMA, Gr 2 DD, mild to mod AI, PASP 32 mmHg  . History of nuclear stress test    Myoview 5/17 - EF 48%, no scar, no ischemia. Low Risk  .  History of pneumonia    79 years old  . Hx of Bell's palsy    left face  . Hypercholesteremia    managed by dr Waynard Edwards  . Hypertension   . 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   . Lung nodules    noted on past CT scans  . Malaria   . SVT (supraventricular tachycardia) (HCC)    remote episode during exercise test  . Thoracic aortic aneurysm [facility])    last scan in Jan 2013. Now measuring 5.0cm. Referred to Dr. Tyrone Sage  . Wears dentures    bottom  . Wears glasses     Past Surgical History:  Procedure Laterality Date  . CARDIOVERSION N/A 03/13/2019   Procedure: CARDIOVERSION;  Surgeon: Pricilla Riffle, MD;  Location: [facility] ENDOSCOPY;  Service: Cardiovascular;  Laterality: N/A;  . CARDIOVERSION N/A 04/15/2019   Procedure: CARDIOVERSION;  Surgeon: Vesta Mixer, MD;  Location: [facility] ENDOSCOPY;  Service: Cardiovascular;  Laterality: N/A;  . CERVICAL FUSION  03-2010   c2-c7  . COLONOSCOPY     x4  . CORONARY STENT PLACEMENT  2004  . dental implant     x 2  . ELBOW SURGERY  1997   lt and rt  . EYE SURGERY    . HIP PINNING,CANNULATED Right 05/11/2014   Procedure: CANNULATED HIP PINNING;  Surgeon: Sheral Apley, MD;  Location: [facility]  OR;  Service: Orthopedics;  Laterality: Right;  . RETINAL DETACHMENT SURGERY  1975   lt  . rotator cuff surgery  1997   lt  . SHOULDER ARTHROSCOPY  2/15   right-got cardiac clearance-gsc  . TONSILLECTOMY    . TRIGGER FINGER RELEASE Left 10/12/2013   Procedure: LEFT LONG FINGER TRIGGER RELEASE;  Surgeon: Jodi Marble, MD;  Location:  SURGERY CENTER;  Service: Orthopedics;  Laterality: Left;    Current Outpatient Medications  Medication Sig Dispense Refill  . amiodarone (PACERONE) 200 MG tablet Take 1 tablet (200 mg total) by mouth daily. 90 tablet 3  . apixaban (ELIQUIS) 5 MG TABS tablet Take 1 tablet (5 mg total) by mouth 2 (two) times daily. 60  tablet 5  . aspirin EC 81 MG tablet Take 1 tablet (81 mg total) by mouth daily. 90 tablet 3  . chlorthalidone (HYGROTON) 25 MG tablet Take 12.5 mg by mouth every other day.     . clobetasol ointment (TEMOVATE) 0.05 % Apply 1 application topically 2 (two) times daily as needed (itchy scaly on legs).     . Coenzyme Q10 (COQ-10) 200 MG CAPS Take 200 mg by mouth daily.     . colesevelam (WELCHOL) 625 MG tablet Take 3,750 mg by mouth daily after supper. 6 tablets    . ezetimibe (ZETIA) 10 MG tablet Take 5 mg by mouth daily.     . Glucosamine-Chondroit-Vit C-Mn (GLUCOSAMINE CHONDR 1500 COMPLX) CAPS Take 2 capsules by mouth every morning.     . hydrocortisone 2.5 % cream Apply 1 application topically as needed (eye brows flakey). Use on head    . Ivermectin (SOOLANTRA EX) Apply 1 application topically daily as needed (Rosachea).    Marland Kitchen ketoconazole (NIZORAL) 2 % cream Apply 1 application topically daily as needed for irritation (Corner of mouth).     . lisinopril (PRINIVIL,ZESTRIL) 40 MG tablet Take 40 mg by mouth every evening.     Marland Kitchen LIVALO 4 MG TABS Take 2 mg by mouth at bedtime.     . Multiple Vitamin (MULTIVITAMIN WITH MINERALS) TABS tablet Take 1 tablet by mouth every morning.     . mupirocin ointment (BACTROBAN) 2 % Place 1 application into the nose daily as needed (Tic bits).    . nitroGLYCERIN (NITROSTAT) 0.4 MG SL tablet Place 1 tablet (0.4 mg total) under the tongue every 5 (five) minutes as needed for chest pain. 25 tablet 1  . omeprazole (PRILOSEC) 20 MG capsule Take 20 mg by mouth daily.      No current facility-administered medications for this visit.    Allergies:   Promethazine hcl, Vesicare [solifenacin succinate], and Quinolones   Social History:  The patient  reports that he quit smoking about 50 years ago. His smokeless tobacco use includes chew. He reports current alcohol use of about 1.0 standard drinks of alcohol per week. He reports that he does not use drugs.   Family  History:  The patient's family history includes Cancer (age of onset: 44) in his mother; Heart attack in his father; Heart disease in his father.    ROS:  Please see the history of present illness.   All other systems are personally reviewed and negative.    Exam:    Vital Signs:  BP 108/76   Pulse 75   Ht 6\' 7"  (2.007 m)   Wt 275 lb (124.7 kg)   BMI 30.98 kg/m    Well appearing, alert and conversant, regular work of  breathing,  good skin color Eyes- anicteric, neuro- grossly intact, skin- no apparent rash or lesions or cyanosis, mouth- oral mucosa is pink   Labs/Other Tests and Data Reviewed:    Recent Labs: 02/17/2019: TSH 2.140 04/07/2019: BUN 20; Creatinine, Ser 1.11; Hemoglobin 15.3; Platelets 193; Potassium 4.0; Sodium 139   Wt Readings from Last 3 Encounters:  04/29/19 275 lb (124.7 kg)  04/22/19 283 lb (128.4 kg)  04/15/19 280 lb (127 kg)     Other studies personally reviewed: Additional studies/ records that were reviewed today include: prior echo, AF clinic records  Review of the above records today demonstrates: as above  ASSESSMENT & PLAN:    1.  Persistent atrial fibrillation The patient has symptomatic, recurrent persistent atrial fibrillation. he has failed medical therapy with amiodarone. Chads2vasc score is 4.  he is anticoagulated with eliquis . Therapeutic strategies for afib including medicine and ablation were discussed in detail with the patient today. Risk, benefits, and alternatives to EP study and radiofrequency ablation for afib were also discussed in detail today. These risks include but are not limited to stroke, bleeding, vascular damage, tamponade, perforation, damage to the esophagus, lungs, and other structures, pulmonary vein stenosis, worsening renal function, and death. The patient understands these risk and wishes to proceed.  We will therefore proceed with catheter ablation at the next available time.  Carto, ICE, anesthesia are requested  for the procedure.  Will also obtain cardiac CT prior to the procedure to exclude LAA thrombus and further evaluate atrial anatomy.  Continue eliquis for chads2vasc score of at least 4. Stop aspirin  2. CAD No ischemic symptoms No recent stents Stop ASA  3. HTN Stable No change required today  Current medicines are reviewed at length with the patient today.   The patient does not have concerns regarding his medicines.  The following changes were made today:  none  Labs/ tests ordered today include:  No orders of the defined types were placed in this encounter.   Patient Risk:  after full review of this patients clinical status, I feel that they are at high risk at this time.   Today, I have spent 30 minutes with the patient with telehealth technology discussing afib .    Signed, Thompson Grayer MD, [facility] 04/29/2019 3:54 PM   [place] [address] [phone] (office) [phone] (fax)

## 2019-04-30 ENCOUNTER — Telehealth: Payer: Self-pay | Admitting: Internal Medicine

## 2019-04-30 ENCOUNTER — Other Ambulatory Visit: Payer: Medicare PPO | Admitting: *Deleted

## 2019-04-30 ENCOUNTER — Other Ambulatory Visit: Payer: Self-pay

## 2019-04-30 DIAGNOSIS — I4819 Other persistent atrial fibrillation: Secondary | ICD-10-CM

## 2019-04-30 LAB — CBC WITH DIFFERENTIAL/PLATELET
Basophils Absolute: 0 10*3/uL (ref 0.0–0.2)
Basos: 0 %
EOS (ABSOLUTE): 0.1 10*3/uL (ref 0.0–0.4)
Eos: 1 %
Hematocrit: 48.2 % (ref 37.5–51.0)
Hemoglobin: 16.3 g/dL (ref 13.0–17.7)
Immature Grans (Abs): 0 10*3/uL (ref 0.0–0.1)
Immature Granulocytes: 0 %
Lymphocytes Absolute: 2.9 10*3/uL (ref 0.7–3.1)
Lymphs: 33 %
MCH: 30.8 pg (ref 26.6–33.0)
MCHC: 33.8 g/dL (ref 31.5–35.7)
MCV: 91 fL (ref 79–97)
Monocytes Absolute: 1 10*3/uL — ABNORMAL HIGH (ref 0.1–0.9)
Monocytes: 11 %
Neutrophils Absolute: 4.8 10*3/uL (ref 1.4–7.0)
Neutrophils: 55 %
Platelets: 257 10*3/uL (ref 150–450)
RBC: 5.3 x10E6/uL (ref 4.14–5.80)
RDW: 12.2 % (ref 11.6–15.4)
WBC: 8.8 10*3/uL (ref 3.4–10.8)

## 2019-04-30 LAB — BASIC METABOLIC PANEL
BUN/Creatinine Ratio: 18 (ref 10–24)
BUN: 23 mg/dL (ref 8–27)
CO2: 24 mmol/L (ref 20–29)
Calcium: 9.3 mg/dL (ref 8.6–10.2)
Chloride: 102 mmol/L (ref 96–106)
Creatinine, Ser: 1.3 mg/dL — ABNORMAL HIGH (ref 0.76–1.27)
GFR calc Af Amer: 61 mL/min/{1.73_m2} (ref >59)
GFR calc non Af Amer: 53 mL/min/{1.73_m2} — ABNORMAL LOW (ref >59)
Glucose: 123 mg/dL — ABNORMAL HIGH (ref 65–99)
Potassium: 3.7 mmol/L (ref 3.5–5.2)
Sodium: 141 mmol/L (ref 134–144)

## 2019-04-30 NOTE — Telephone Encounter (Signed)
Pt got his covid vaccine yesterday, and lost his card.  Pt would like to know what he should do?

## 2019-04-30 NOTE — Telephone Encounter (Signed)
Returned call to pt.  Pt. Stated he lost the card that he was given yesterday, when he rec'd. his 1st vaccine.  Advised he may need to request a replacement, when he receives his 2nd vaccine on 05/17/19.  Advised pt. will continue to research if there is a means to replace it sooner.  Advised will contact pt. tomorrow with any further information.  Verb. Understanding.

## 2019-05-01 NOTE — Telephone Encounter (Signed)
Returned call to pt.  Advised that after researching, I was informed that since pt's 1st vaccine will be in the Data base, he should not have any difficulty when he returns for his 2nd dose.  Pt. Verb. Understanding.

## 2019-05-06 ENCOUNTER — Telehealth: Payer: Self-pay | Admitting: Cardiovascular Disease

## 2019-05-06 ENCOUNTER — Ambulatory Visit (HOSPITAL_COMMUNITY)
Admission: RE | Admit: 2019-05-06 | Discharge: 2019-05-06 | Disposition: A | Discharge status: Home / Self Care | Payer: Medicare PPO | Source: Ambulatory Visit | Attending: Internal Medicine | Admitting: Internal Medicine

## 2019-05-06 ENCOUNTER — Other Ambulatory Visit: Payer: Self-pay

## 2019-05-06 DIAGNOSIS — I4891 Unspecified atrial fibrillation: Secondary | ICD-10-CM | POA: Insufficient documentation

## 2019-05-06 MED ORDER — IOHEXOL 350 MG/ML SOLN
80.0000 mL | Freq: Once | INTRAVENOUS | Status: AC | PRN
  Administered 2019-05-06: 80 mL via INTRAVENOUS

## 2019-05-06 NOTE — Telephone Encounter (Signed)
thanks

## 2019-05-06 NOTE — Telephone Encounter (Signed)
Follow Up:    Pt said he was told to call in with his blood pressure readings.  04-14-19- 126/84 pule 59  04-18-19- 139/90 pulse 60  04-19-19- 117/83 pulse 62  04-20-19- 122/86 pulse 60  04-21-19- 119/79 pulse79  04-22-19- 109/77 pulse 77  04-25-19- 11/82 pulse 78  118-21- 95/63 pulse 71  119-21 114/79 pulse 71  04-29-19 106/80 pulse 76  04-30-19 108/76 pulse 75  05-01-19- 102/75 pulse 66  05-03-19- 94/65 pulse 67  05-05-19 - 96/65 pulse 72  05-06-19- 115/79 pulse 62

## 2019-05-08 ENCOUNTER — Other Ambulatory Visit (HOSPITAL_COMMUNITY)
Admission: RE | Admit: 2019-05-08 | Discharge: 2019-05-08 | Disposition: A | Discharge status: Home / Self Care | Payer: Medicare PPO | Source: Ambulatory Visit | Attending: Internal Medicine | Admitting: Internal Medicine

## 2019-05-08 DIAGNOSIS — Z20822 Contact with and (suspected) exposure to covid-19: Secondary | ICD-10-CM | POA: Diagnosis not present

## 2019-05-08 DIAGNOSIS — Z01812 Encounter for preprocedural laboratory examination: Secondary | ICD-10-CM | POA: Insufficient documentation

## 2019-05-08 LAB — SARS CORONAVIRUS 2 (TAT 6-24 HRS): SARS Coronavirus 2: NEGATIVE

## 2019-05-10 NOTE — Progress Notes (Signed)
Chief Complaint  Patient presents with  . Follow-up    CAD   History of Present Illness: 79 yo male with h/o CAD s/p stent RCA 2004, HTN, HLD, thoracic aortic aneurysm and persistent atrial fibrillation here today for cardiac follow up. He has a thoracic aortic aneurysm and is followed yearly by Dr. Tyrone SageGerhardt with yearly chest CT scans. He is known to have CAD and had a stent placed in the RCA in 2004. Last cardiac cath in 2013 with mild disease in the LAD and Circumflex, patent stent proximal RCA and moderate disease in the small PDA. He has not tolerated statins due to muscle aches or beta blockers secondary to bradycardia. Nuclear stress test 08/16/15 with no ischemia. Echo May 2017 with normal LV systolic function, mild to moderate AI. New onset atrial fibrillation November 2020. He was started on Eliquis. Echo November 2020 with LVEF=50-55%, mild AI. He was cardioverted to sinus 03/13/19 but converted back to atrial fib. He was started on amiodarone and had repeat cardioversion 04/15/19 but did not maintain sinus rhythm. He has been seen by Dr. Johney FrameAllred and planning is underway for atrial fibrillation ablation next week.   He is here today for follow up. The patient denies any chest pain, palpitations, lower extremity edema, orthopnea, PND, dizziness, near syncope or syncope. He continues to have dyspnea. This was better when he was in sinus. He has no energy.    Primary Care Physician: Rodrigo RanPerini, Mark, MD  Past Medical History:  Diagnosis Date  . Arthritis   . Coronary artery disease    Stent 2004  . GERD (gastroesophageal reflux disease)   . History of echocardiogram    Echo 5/17: mild LVH, EF 50-55%, no RWMA, Gr 2 DD, mild to mod AI, PASP 32 mmHg  . History of nuclear stress test    Myoview 5/17 - EF 48%, no scar, no ischemia. Low Risk  . History of pneumonia    79 years old  . Hx of Bell's palsy    left face  . Hypercholesteremia    managed by dr Waynard Edwardsperini  . Hypertension   . 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   . Lung nodules    noted on past CT scans  . Malaria   . SVT (supraventricular tachycardia) (HCC)    remote episode during exercise test  . Thoracic aortic aneurysm [facility](HCC)    last scan in Jan 2013. Now measuring 5.0cm. Referred to Dr. Tyrone SageGerhardt  . Wears dentures    bottom  . Wears glasses     Past Surgical History:  Procedure Laterality Date  . CARDIOVERSION N/A 03/13/2019   Procedure: CARDIOVERSION;  Surgeon: Pricilla Riffleoss, Paula V, MD;  [facility];  Service: Cardiovascular;  Laterality: N/A;  . CARDIOVERSION N/A 04/15/2019   Procedure: CARDIOVERSION;  Surgeon: Vesta MixerNahser, Philip J, MD;  [facility];  Service: Cardiovascular;  Laterality: N/A;  . CERVICAL FUSION  03-2010   c2-c7  . COLONOSCOPY     x4  . CORONARY STENT PLACEMENT  2004  . dental implant     x 2  . ELBOW SURGERY  1997   lt and rt  . EYE SURGERY    . HIP PINNING,CANNULATED Right 05/11/2014   Procedure: CANNULATED HIP PINNING;  Surgeon: Sheral Apleyimothy D Murphy, MD;  [facility];  Service: Orthopedics;  Laterality: Right;  .  RETINAL DETACHMENT SURGERY  1975   lt  . rotator cuff surgery  1997   lt  . SHOULDER ARTHROSCOPY  2/15   right-got cardiac clearance-gsc  . TONSILLECTOMY    . TRIGGER FINGER RELEASE Left 10/12/2013   Procedure: LEFT LONG FINGER TRIGGER RELEASE;  Surgeon: Jodi Marble, MD;  Location: [facility];  Service: Orthopedics;  Laterality: Left;    Current Outpatient Medications  Medication Sig Dispense Refill  . acetaminophen (TYLENOL) 500 MG tablet Take 1,000 mg by mouth every 6 (six) hours as needed for moderate pain or headache.    Marland Kitchen amiodarone (PACERONE) 200 MG tablet Take 1 tablet (200 mg total) by mouth daily. 90 tablet 3  . apixaban (ELIQUIS) 5 MG TABS tablet Take 1 tablet (5 mg total) by mouth 2 (two) times daily. 60 tablet 5  . chlorthalidone  (HYGROTON) 25 MG tablet Take 25 mg by mouth daily.     . clobetasol ointment (TEMOVATE) 0.05 % Apply 1 application topically 2 (two) times daily as needed (itchy scaly on legs).     . Coenzyme Q10 (COQ-10) 200 MG CAPS Take 200 mg by mouth daily.     . colesevelam (WELCHOL) 625 MG tablet Take 3,750 mg by mouth daily after supper. 6 tablets    . ezetimibe (ZETIA) 10 MG tablet Take 5 mg by mouth every evening.     . fluticasone (FLONASE) 50 MCG/ACT nasal spray Place 1 spray into both nostrils daily as needed for allergies or rhinitis.    . Glucosamine-Chondroit-Vit C-Mn (GLUCOSAMINE CHONDR 1500 COMPLX) CAPS Take 2 capsules by mouth every morning.     . hydrocortisone 2.5 % cream Apply 1 application topically as needed (eye brows flakey). Use on head    . Ivermectin (SOOLANTRA EX) Apply 1 application topically daily as needed (Rosachea).    Marland Kitchen ketoconazole (NIZORAL) 2 % cream Apply 1 application topically daily as needed for irritation (Corner of mouth).     . lisinopril (PRINIVIL,ZESTRIL) 40 MG tablet Take 40 mg by mouth every evening.     Marland Kitchen LIVALO 4 MG TABS Take 2 mg by mouth at bedtime.     . Multiple Vitamin (MULTIVITAMIN WITH MINERALS) TABS tablet Take 1 tablet by mouth every morning.     . mupirocin ointment (BACTROBAN) 2 % Place 1 application into the nose daily as needed (Tic bits).    . nitroGLYCERIN (NITROSTAT) 0.4 MG SL tablet Place 1 tablet (0.4 mg total) under the tongue every 5 (five) minutes as needed for chest pain. 25 tablet 1  . omeprazole (PRILOSEC) 20 MG capsule Take 20 mg by mouth daily.     Marland Kitchen aspirin EC 81 MG tablet Take 1 tablet (81 mg total) by mouth daily. (Patient not taking: Reported on 05/11/2019) 90 tablet 3   No current facility-administered medications for this visit.    Allergies  Allergen Reactions  . Promethazine Hcl Other (See Comments)    Agitation and incoherence (phenergan)  . Vesicare [Solifenacin Succinate] Itching and Rash  . Quinolones     Patient was  warned about not using Cipro and similar antibiotics. Recent studies have raised concern that fluoroquinolone antibiotics could be associated with an increased risk of aortic aneurysm Fluoroquinolones have non-antimicrobial properties that might jeopardise the integrity of the extracellular matrix of the vascular wall In a  propensity score matched cohort study in [place], there was a 66% increased rate of aortic aneurysm or dissection associated with oral fluoroquinolone use, compared wit  Social History   Socioeconomic History  . Marital status: Married    Spouse name: Not on file  . Number of children: 1  . Years of education: Not on file  . Highest education level: Not on file  Occupational History  . Occupation: retired Advertising account executive: RETIRED  Tobacco Use  . Smoking status: Former Smoker    Quit date: 04/09/1969    Years since quitting: 50.1  . Smokeless tobacco: Current User    Types: Chew  . Tobacco comment: reports that when he smoked it was intermittent  Substance and Sexual Activity  . Alcohol use: Yes    Alcohol/week: 1.0 standard drinks    Types: 1 Standard drinks or equivalent per week    Comment: occasional mainly during holiday  . Drug use: No  . Sexual activity: Yes  Other Topics Concern  . 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 Difficulty of Paying Living Expenses: Not on file  Food Insecurity:   . Worried About Programme researcher, broadcasting/film/video in the Last Year: Not on file  . Ran Out of Food in the Last Year: Not on file  Transportation Needs:   . Lack of Transportation (Medical): Not on file  . Lack of Transportation (Non-Medical): Not on file  Physical Activity:   . Days of Exercise per Week: Not on file  . Minutes of Exercise per Session: Not on file  Stress:   . Feeling  of Stress : Not on file  Social Connections:   . Frequency of Communication with Friends and Family: Not on file  . Frequency of Social Gatherings with Friends and Family: Not on file  . Attends Religious Services: Not on file  . Active Member of Clubs or Organizations: Not on file  . Attends Banker Meetings: Not on file  . Marital Status: Not on file  Intimate Partner Violence:   . Fear of Current or Ex-Partner: Not on file  . Emotionally Abused: Not on file  . Physically Abused: Not on file  . Sexually Abused: Not on file    Family History  Problem Relation Age of Onset  . Heart disease Father   . Heart attack Father        92  . Cancer Mother 88       LUNG  . Hypertension Neg Hx   . Stroke Neg Hx     Review of Systems:  As stated in the HPI and otherwise negative.   BP 92/62   Pulse 63   Ht 6\' 7"  (2.007 m)   Wt 281 lb (127.5 kg)   SpO2 94%   BMI 31.66 kg/m   Physical Examination:  General: Well developed, well nourished, NAD  HEENT: OP clear, mucus membranes moist  SKIN: warm, dry. No rashes. Neuro: No focal deficits  Musculoskeletal: Muscle strength 5/5 all ext  Psychiatric: Mood and affect normal  Neck: No JVD, no carotid bruits, no thyromegaly, no lymphadenopathy.  Lungs:Clear bilaterally, no wheezes, rhonci, crackles Cardiovascular: Regular rate and rhythm. No murmurs, gallops or rubs. Abdomen:Soft. Bowel sounds present. Non-tender.  Extremities: No lower extremity edema. Pulses are 2 + in the bilateral DP/PT.  Cardiac cath 06/08/11: Left main: There are separate ostia for the LAD and Circumflex.  Left Anterior Descending  Artery: Large vessel that courses to the apex. No obstructive disease noted.Small diagonal branch.  Circumflex Artery: Large caliber vessel with no obstructive disease noted.  Right Coronary Artery: Very large, dominant vessel with patent proximal stent. There is mild plaque disease in the mid and distal RCA. The small caliber  PDA has 60% stenosis.  Left Ventricular Angiogram: Dilated LV. LVEF=50%.  Aortic root: Dilated aortic root and ascending aorta.  Impression:  1. 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 Preserved LV systolic function  3. 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   Echo 02/26/19:  1. Left ventricular ejection fraction, by visual estimation, is 50 to  55%. The left ventricle has normal function. There is moderately increased  left ventricular hypertrophy.  2. Definity contrast agent was given IV to delineate the left ventricular  endocardial borders.  3. Left ventricular diastolic function could not be evaluated.  4. Global right ventricle has normal systolic function.The right  ventricular size is normal.  5. Left atrial size was normal.  6. Right atrial size was normal.  7. The mitral valve is normal in structure. Trace mitral valve  regurgitation. No evidence of mitral stenosis.  8. The tricuspid valve is normal in structure. Tricuspid valve  regurgitation is trivial.  9. The aortic valve is tricuspid. Aortic valve regurgitation is mild.  Mild aortic valve sclerosis without stenosis.  10. The pulmonic valve was not well visualized. Pulmonic valve  regurgitation is not visualized.  11. Aortic dilatation noted.  12. There is moderate to severe dilatation of the aortic root and of the  ascending aorta measuring 52 mm.  13. The inferior vena cava is normal in size with greater than 50%  respiratory variability, suggesting right atrial pressure of 3 mmHg.  14. Definity used; normal LV systolic function; moderate LVH; moderate to  severe dilatation of aortic root/ascending aorta (5.2 cm; suggest CTA or  MRA to further assess); mild AI.   EKG:  EKG is not ordered today. The ekg ordered today demonstrates   Recent Labs: 02/17/2019: TSH 2.140 04/30/2019: BUN 23; Creatinine, Ser 1.30; Hemoglobin 16.3; Platelets 257; Potassium 3.7; Sodium 141   Lipid  Panel Followed in primary care   Wt Readings from Last 3 Encounters:  05/11/19 281 lb (127.5 kg)  04/29/19 275 lb (124.7 kg)  04/22/19 283 lb (128.4 kg)     Other studies Reviewed: Additional studies/ records that were reviewed today include: . Review of the above records demonstrates:    Assessment and Plan:   1. CAD without angina: No chest pain. Last cath in 2013 with patent stent RCA, mild disease in the LAD and Circumflex. Stress test 2017 with no ischemia. Continue statin and ASA. No beta blocker due to bradycardia.    2. Thoracic Aortic aneurysm: Followed in the CT surgery clinic by Dr. Tyrone Sage.   3. HTN: BP is controlled. No changes  4. HLD: Lipids followed in primary care and well controlled. Continue statin and Zetia.   5. Aortic Valve disease: Mild AI by echo in November 2020.   6. Persistent atrial fibrillation: He is in atrial fibrillation today. He is now being followed by Dr. Johney Frame. He is on amiodarone and Eliquis. Planning for ablation tomorrow  Current medicines are reviewed at length with the patient today.  The patient does not have concerns regarding medicines.  The following changes have been made:  no change  Labs/ tests ordered today include:   No orders of the defined types  were placed in this encounter.   Disposition:   FU with me in 12 months  Signed, Lauree Chandler, MD 05/11/2019 9:32 AM    [facility] [address] [place], [place], [place]  [address] Phone: [phone]; Fax: [phone]

## 2019-05-11 ENCOUNTER — Ambulatory Visit: Payer: Medicare PPO | Admitting: Cardiovascular Disease

## 2019-05-11 ENCOUNTER — Encounter: Payer: Self-pay | Admitting: Cardiovascular Disease

## 2019-05-11 ENCOUNTER — Other Ambulatory Visit: Payer: Self-pay

## 2019-05-11 VITALS — BP 92/62 | HR 63 | Ht 79.0 in | Wt 281.0 lb

## 2019-05-11 DIAGNOSIS — I4819 Other persistent atrial fibrillation: Secondary | ICD-10-CM | POA: Diagnosis not present

## 2019-05-11 DIAGNOSIS — I10 Essential (primary) hypertension: Secondary | ICD-10-CM | POA: Diagnosis not present

## 2019-05-11 DIAGNOSIS — I712 Thoracic aortic aneurysm, without rupture, unspecified: Secondary | ICD-10-CM

## 2019-05-11 DIAGNOSIS — E7800 Pure hypercholesterolemia, unspecified: Secondary | ICD-10-CM | POA: Diagnosis not present

## 2019-05-11 DIAGNOSIS — I351 Nonrheumatic aortic (valve) insufficiency: Secondary | ICD-10-CM

## 2019-05-11 DIAGNOSIS — I2510 Atherosclerotic heart disease of native coronary artery without angina pectoris: Secondary | ICD-10-CM

## 2019-05-12 ENCOUNTER — Encounter (HOSPITAL_COMMUNITY): Payer: Self-pay | Admitting: Internal Medicine

## 2019-05-12 ENCOUNTER — Encounter (HOSPITAL_COMMUNITY)
Admission: RE | Disposition: A | Discharge status: Home / Self Care | Payer: Medicare PPO | Source: Home / Self Care | Attending: Internal Medicine

## 2019-05-12 ENCOUNTER — Ambulatory Visit (HOSPITAL_COMMUNITY)
Admission: RE | Admit: 2019-05-12 | Discharge: 2019-05-13 | Disposition: A | Discharge status: Home / Self Care | Payer: Medicare PPO | Attending: Internal Medicine | Admitting: Internal Medicine

## 2019-05-12 ENCOUNTER — Ambulatory Visit (HOSPITAL_COMMUNITY): Payer: Medicare PPO | Admitting: Anesthesiology

## 2019-05-12 DIAGNOSIS — I4819 Other persistent atrial fibrillation: Secondary | ICD-10-CM | POA: Diagnosis present

## 2019-05-12 DIAGNOSIS — Z8249 Family history of ischemic heart disease and other diseases of the circulatory system: Secondary | ICD-10-CM | POA: Insufficient documentation

## 2019-05-12 DIAGNOSIS — I10 Essential (primary) hypertension: Secondary | ICD-10-CM | POA: Diagnosis not present

## 2019-05-12 DIAGNOSIS — Z7901 Long term (current) use of anticoagulants: Secondary | ICD-10-CM | POA: Insufficient documentation

## 2019-05-12 DIAGNOSIS — Z79899 Other long term (current) drug therapy: Secondary | ICD-10-CM | POA: Diagnosis not present

## 2019-05-12 DIAGNOSIS — Z7982 Long term (current) use of aspirin: Secondary | ICD-10-CM | POA: Insufficient documentation

## 2019-05-12 DIAGNOSIS — E7800 Pure hypercholesterolemia, unspecified: Secondary | ICD-10-CM | POA: Diagnosis not present

## 2019-05-12 DIAGNOSIS — Z87891 Personal history of nicotine dependence: Secondary | ICD-10-CM | POA: Insufficient documentation

## 2019-05-12 DIAGNOSIS — I4811 Longstanding persistent atrial fibrillation: Secondary | ICD-10-CM | POA: Diagnosis present

## 2019-05-12 DIAGNOSIS — K219 Gastro-esophageal reflux disease without esophagitis: Secondary | ICD-10-CM | POA: Insufficient documentation

## 2019-05-12 DIAGNOSIS — Z955 Presence of coronary angioplasty implant and graft: Secondary | ICD-10-CM | POA: Diagnosis not present

## 2019-05-12 DIAGNOSIS — S301XXA Contusion of abdominal wall, initial encounter: Secondary | ICD-10-CM | POA: Diagnosis present

## 2019-05-12 DIAGNOSIS — I714 Abdominal aortic aneurysm, without rupture: Secondary | ICD-10-CM | POA: Insufficient documentation

## 2019-05-12 DIAGNOSIS — I2510 Atherosclerotic heart disease of native coronary artery without angina pectoris: Secondary | ICD-10-CM | POA: Insufficient documentation

## 2019-05-12 HISTORY — PX: ATRIAL FIBRILLATION ABLATION: EP1191

## 2019-05-12 LAB — POCT ACTIVATED CLOTTING TIME
Activated Clotting Time: 224 seconds
Activated Clotting Time: 246 seconds
Activated Clotting Time: 285 seconds

## 2019-05-12 SURGERY — ATRIAL FIBRILLATION ABLATION
Anesthesia: General

## 2019-05-12 MED ORDER — CHLORTHALIDONE 25 MG PO TABS
25.0000 mg | ORAL_TABLET | Freq: Every day | ORAL | Status: DC
  Administered 2019-05-13: 25 mg via ORAL
  Filled 2019-05-12: qty 1

## 2019-05-12 MED ORDER — FENTANYL CITRATE (PF) 100 MCG/2ML IJ SOLN
INTRAMUSCULAR | Status: DC | PRN
  Administered 2019-05-12: 100 ug via INTRAVENOUS

## 2019-05-12 MED ORDER — ACETAMINOPHEN 325 MG PO TABS: 650.0000 mg | ORAL_TABLET | ORAL | Status: DC | PRN

## 2019-05-12 MED ORDER — LISINOPRIL 40 MG PO TABS
40.0000 mg | ORAL_TABLET | Freq: Every evening | ORAL | Status: DC
  Administered 2019-05-12: 21:00:00 40 mg via ORAL
  Filled 2019-05-12: qty 1

## 2019-05-12 MED ORDER — ONDANSETRON HCL 4 MG/2ML IJ SOLN: 4.0000 mg | Freq: Four times a day (QID) | INTRAMUSCULAR | Status: DC | PRN

## 2019-05-12 MED ORDER — SODIUM CHLORIDE 0.9% FLUSH: 3.0000 mL | Freq: Two times a day (BID) | INTRAVENOUS | Status: DC

## 2019-05-12 MED ORDER — LIDOCAINE 2% (20 MG/ML) 5 ML SYRINGE
INTRAMUSCULAR | Status: DC | PRN
  Administered 2019-05-12: 100 mg via INTRAVENOUS

## 2019-05-12 MED ORDER — SUGAMMADEX SODIUM 200 MG/2ML IV SOLN
INTRAVENOUS | Status: DC | PRN
  Administered 2019-05-12: 200 mg via INTRAVENOUS

## 2019-05-12 MED ORDER — HEPARIN SODIUM (PORCINE) 1000 UNIT/ML IJ SOLN
INTRAMUSCULAR | Status: AC
  Filled 2019-05-12: qty 1

## 2019-05-12 MED ORDER — HYDROCODONE-ACETAMINOPHEN 5-325 MG PO TABS
1.0000 | ORAL_TABLET | ORAL | Status: DC | PRN
  Administered 2019-05-13: 1 via ORAL
  Administered 2019-05-13: 04:00:00 2 via ORAL
  Filled 2019-05-12: qty 2
  Filled 2019-05-12: qty 1

## 2019-05-12 MED ORDER — SODIUM CHLORIDE 0.9% FLUSH
3.0000 mL | Freq: Two times a day (BID) | INTRAVENOUS | Status: DC
  Administered 2019-05-12 – 2019-05-13 (×2): 3 mL via INTRAVENOUS

## 2019-05-12 MED ORDER — HEPARIN (PORCINE) IN NACL 1000-0.9 UT/500ML-% IV SOLN
INTRAVENOUS | Status: DC | PRN
  Administered 2019-05-12: 500 mL

## 2019-05-12 MED ORDER — PROPOFOL 10 MG/ML IV BOLUS
INTRAVENOUS | Status: DC | PRN
  Administered 2019-05-12: 130 mg via INTRAVENOUS

## 2019-05-12 MED ORDER — HYDROCODONE-ACETAMINOPHEN 5-325 MG PO TABS: 1.0000 | ORAL_TABLET | ORAL | Status: DC | PRN

## 2019-05-12 MED ORDER — PROTAMINE SULFATE 10 MG/ML IV SOLN
INTRAVENOUS | Status: DC | PRN
  Administered 2019-05-12: 40 mg via INTRAVENOUS

## 2019-05-12 MED ORDER — SODIUM CHLORIDE 0.9 % IV SOLN: 250.0000 mL | INTRAVENOUS | Status: DC | PRN

## 2019-05-12 MED ORDER — SODIUM CHLORIDE 0.9% FLUSH: 3.0000 mL | INTRAVENOUS | Status: DC | PRN

## 2019-05-12 MED ORDER — APIXABAN 5 MG PO TABS
5.0000 mg | ORAL_TABLET | Freq: Two times a day (BID) | ORAL | Status: DC
  Administered 2019-05-12 – 2019-05-13 (×2): 5 mg via ORAL
  Filled 2019-05-12 (×2): qty 1

## 2019-05-12 MED ORDER — HEPARIN SODIUM (PORCINE) 1000 UNIT/ML IJ SOLN
INTRAMUSCULAR | Status: AC
  Filled 2019-05-12: qty 2

## 2019-05-12 MED ORDER — OFF THE BEAT BOOK
Freq: Once | Status: AC
  Filled 2019-05-12: qty 1

## 2019-05-12 MED ORDER — ONDANSETRON HCL 4 MG/2ML IJ SOLN
INTRAMUSCULAR | Status: DC | PRN
  Administered 2019-05-12: 4 mg via INTRAVENOUS

## 2019-05-12 MED ORDER — ROCURONIUM BROMIDE 10 MG/ML (PF) SYRINGE
PREFILLED_SYRINGE | INTRAVENOUS | Status: DC | PRN
  Administered 2019-05-12: 100 mg via INTRAVENOUS
  Administered 2019-05-12: 30 mg via INTRAVENOUS

## 2019-05-12 MED ORDER — SODIUM CHLORIDE 0.9 % IV SOLN: INTRAVENOUS | Status: DC

## 2019-05-12 MED ORDER — HEPARIN SODIUM (PORCINE) 1000 UNIT/ML IJ SOLN
INTRAMUSCULAR | Status: DC | PRN
  Administered 2019-05-12 (×3): 5000 [IU] via INTRAVENOUS

## 2019-05-12 MED ORDER — APIXABAN 5 MG PO TABS
5.0000 mg | ORAL_TABLET | Freq: Once | ORAL | Status: AC
  Administered 2019-05-12: 11:00:00 5 mg via ORAL
  Filled 2019-05-12: qty 1

## 2019-05-12 MED ORDER — HEPARIN SODIUM (PORCINE) 1000 UNIT/ML IJ SOLN
INTRAMUSCULAR | Status: DC | PRN
  Administered 2019-05-12: 1000 [IU] via INTRAVENOUS
  Administered 2019-05-12: 14000 [IU] via INTRAVENOUS

## 2019-05-12 MED ORDER — PHENYLEPHRINE HCL-NACL 10-0.9 MG/250ML-% IV SOLN
INTRAVENOUS | Status: DC | PRN
  Administered 2019-05-12: 25 ug/min via INTRAVENOUS

## 2019-05-12 MED ORDER — AMIODARONE HCL 200 MG PO TABS
200.0000 mg | ORAL_TABLET | Freq: Every day | ORAL | Status: DC
  Administered 2019-05-13: 09:00:00 200 mg via ORAL
  Filled 2019-05-12: qty 1

## 2019-05-12 SURGICAL SUPPLY — 19 items
BLANKET WARM UNDERBOD FULL ACC (MISCELLANEOUS) ×3 IMPLANT
CATH MAPPNG PENTARAY F 2-6-2MM (CATHETERS) ×1 IMPLANT
CATH SMTCH THERMOCOOL SF DF (CATHETERS) ×3 IMPLANT
CATH SOUNDSTAR ECO 8FR (CATHETERS) ×3 IMPLANT
CATH WEBSTER BI DIR CS D-F CRV (CATHETERS) ×3 IMPLANT
COVER SWIFTLINK CONNECTOR (BAG) ×3 IMPLANT
DEVICE CLOSURE PERCLS PRGLD 6F (VASCULAR PRODUCTS) ×3 IMPLANT
NEEDLE BAYLIS TRANSSEPTAL 71CM (NEEDLE) ×3 IMPLANT
PACK EP LATEX FREE (CUSTOM PROCEDURE TRAY) ×2
PACK EP LF (CUSTOM PROCEDURE TRAY) ×1 IMPLANT
PAD PRO RADIOLUCENT 2001M-C (PAD) ×3 IMPLANT
PATCH CARTO3 (PAD) ×3 IMPLANT
PENTARAY F 2-6-2MM (CATHETERS) ×3
PERCLOSE PROGLIDE 6F (VASCULAR PRODUCTS) ×9
SHEATH PINNACLE 7F 10CM (SHEATH) ×6 IMPLANT
SHEATH PINNACLE 9F 10CM (SHEATH) ×3 IMPLANT
SHEATH PROBE COVER 6X72 (BAG) ×3 IMPLANT
SHEATH SWARTZ TS SL2 63CM 8.5F (SHEATH) ×3 IMPLANT
TUBING SMART ABLATE COOLFLOW (TUBING) ×3 IMPLANT

## 2019-05-12 NOTE — Progress Notes (Signed)
Reappearance of hematoma. Pressure held for 10 minutes. Merita Norton, PA in to see patient.

## 2019-05-12 NOTE — Progress Notes (Signed)
Hematoma present on assessment. Pressure held for 10 minutes. Site soft.

## 2019-05-12 NOTE — Progress Notes (Signed)
Hematoma has again presented itself. MD notified. Pressure currently being held.

## 2019-05-12 NOTE — Discharge Instructions (Signed)
Post procedure care instructions No driving for 4 days. No lifting over 5 lbs for 1 week. No vigorous or sexual activity for 1 week. You may return to work/your usual activities on 05/19/2019. Keep procedure site clean & dry. If you notice increased pain, swelling, bleeding or pus, call/return!  You may shower, but no soaking baths/hot tubs/pools for 1 week.     Cardiac Ablation, Care After  This sheet gives you information about how to care for yourself after your procedure. Your health care provider may also give you more specific instructions. If you have problems or questions, contact your health care provider. What can I expect after the procedure? After the procedure, it is common to have:  Bruising around your puncture site.  Tenderness around your puncture site.  Skipped heartbeats.  Tiredness (fatigue).  Follow these instructions at home: Puncture site care   Follow instructions from your health care provider about how to take care of your puncture site. Make sure you: ? If present, leave stitches (sutures), skin glue, or adhesive strips in place. These skin closures may need to stay in place for up to 2 weeks. If adhesive strip edges start to loosen and curl up, you may trim the loose edges. Do not remove adhesive strips completely unless your health care provider tells you to do that.  Check your puncture site every day for signs of infection. Check for: ? Redness, swelling, or pain. ? Fluid or blood. If your puncture site starts to bleed, lie down on your back, apply firm pressure to the area, and contact your health care provider. ? Warmth. ? Pus or a bad smell. Driving  Do not drive for at least 4 days after your procedure or however long your health care provider recommends. (Do not resume driving if you have previously been instructed not to drive for other health reasons.)  Do not drive or use heavy machinery while taking prescription pain medicine. Activity  Avoid  activities that take a lot of effort for at least 7 days after your procedure.  Do not lift anything that is heavier than 5 lb (4.5 kg) for one week.   No sexual activity for 1 week.   Return to your normal activities as told by your health care provider. Ask your health care provider what activities are safe for you. General instructions  Take over-the-counter and prescription medicines only as told by your health care provider.  Do not use any products that contain nicotine or tobacco, such as cigarettes and e-cigarettes. If you need help quitting, ask your health care provider.  You may shower after 24 hours, but Do not take baths, swim, or use a hot tub for 1 week.   Do not drink alcohol for 24 hours after your procedure.  Keep all follow-up visits as told by your health care provider. This is important. Contact a health care provider if:  You have redness, mild swelling, or pain around your puncture site.  You have fluid or blood coming from your puncture site that stops after applying firm pressure to the area.  Your puncture site feels warm to the touch.  You have pus or a bad smell coming from your puncture site.  You have a fever.  You have chest pain or discomfort that spreads to your neck, jaw, or arm.  You are sweating a lot.  You feel nauseous.  You have a fast or irregular heartbeat.  You have shortness of breath.  You are dizzy  or light-headed and feel the need to lie down.  You have pain or numbness in the arm or leg closest to your puncture site. Get help right away if:  Your puncture site suddenly swells.  Your puncture site is bleeding and the bleeding does not stop after applying firm pressure to the area. These symptoms may represent a serious problem that is an emergency. Do not wait to see if the symptoms will go away. Get medical help right away. Call your local emergency services (911 in the [place] Do not drive yourself to the  hospital. Summary  After the procedure, it is normal to have bruising and tenderness at the puncture site in your groin, neck, or forearm.  Check your puncture site every day for signs of infection.  Get help right away if your puncture site is bleeding and the bleeding does not stop after applying firm pressure to the area. This is a medical emergency. This information is not intended to replace advice given to you by your health care provider. Make sure you discuss any questions you have with your health care provider.

## 2019-05-12 NOTE — Transfer of Care (Signed)
Immediate Anesthesia Transfer of Care Note  Patient: Ronald [name]  Procedure(s) Performed: ATRIAL FIBRILLATION ABLATION (N/A )  Patient Location: Cath Lab  Anesthesia Type:General  Level of Consciousness: awake, alert  and oriented  Airway & Oxygen Therapy: Patient Spontanous Breathing  Post-op Assessment: Report given to RN and Post -op Vital signs reviewed and stable  Post vital signs: Reviewed and stable  Last Vitals:  Vitals Value Taken Time  BP 110/69 05/12/19 1103  Temp    Pulse 63 05/12/19 1106  Resp 15 05/12/19 1106  SpO2 94 % 05/12/19 1106  Vitals shown include unvalidated device data.  Last Pain:  Vitals:   05/12/19 0625  TempSrc:   PainSc: 0-No pain      Patients Stated Pain Goal: 3 (05/12/19 3010)  Complications: No apparent anesthesia complications

## 2019-05-12 NOTE — Progress Notes (Signed)
Pressure held right groin, 15 minutes, site firm and bruised, 4" x 6", VSS, post manual hold site is soft/ pressure dressing applied right groin.

## 2019-05-12 NOTE — Progress Notes (Signed)
Patient with groin bleeding post implant.  Will admit for overnight observation.  Ok to give tonights eliquis does at 11 pm.  Hillis Range MD, [facility] 05/12/2019 9:45 PM

## 2019-05-12 NOTE — Interval H&P Note (Signed)
History and Physical Interval Note:  05/12/2019 7:17 AM  Ronald [name]  has presented today for surgery, with the diagnosis of afib.  The various methods of treatment have been discussed with the patient and family. After consideration of risks, benefits and other options for treatment, the patient has consented to  Procedure(s): ATRIAL FIBRILLATION ABLATION (N/A) as a surgical intervention.  The patient's history has been reviewed, patient examined, no change in status, stable for surgery.  I have reviewed the patient's chart and labs.  Questions were answered to the patient's satisfaction.     Cardiac CT reviewed at length with patient.  He reports compliance with eliquis without interruptio.  Hillis Range

## 2019-05-12 NOTE — Anesthesia Preprocedure Evaluation (Addendum)
Anesthesia Evaluation  Patient identified by MRN, date of birth, ID band Patient awake    Reviewed: Allergy & Precautions, NPO status , Patient's Chart, lab work & pertinent test results  Airway Mallampati: III  TM Distance: >3 FB Neck ROM: Full    Dental  (+) Teeth Intact, Dental Advisory Given   Pulmonary former smoker,    breath sounds clear to auscultation       Cardiovascular hypertension, Pt. on medications + CAD   Rhythm:Irregular Rate:Normal  Echo 2020 1. Left ventricular ejection fraction, by visual estimation, is 50 to 55%. The left ventricle has normal function. There is moderately increased left ventricular hypertrophy.  2. Definity contrast agent was given IV to delineate the left ventricular endocardial borders.  3. Left ventricular diastolic function could not be evaluated.  4. Global right ventricle has normal systolic function.The right ventricular size is normal.  5. Left atrial size was normal.  6. Right atrial size was normal.  7. The mitral valve is normal in structure. Trace mitral valve regurgitation. No evidence of mitral stenosis.  8. The tricuspid valve is normal in structure. Tricuspid valve  regurgitation is trivial.  9. The aortic valve is tricuspid. Aortic valve regurgitation is mild. Mild aortic valve sclerosis without stenosis.  10. The pulmonic valve was not well visualized. Pulmonic valve regurgitation is not visualized.  11. Aortic dilatation noted.  12. 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  13. The inferior vena cava is normal in size with greater than 50% respiratory variability, suggesting right atrial pressure of 3 mmHg.  14. Definity used; normal LV systolic function; moderate LVH; moderate to severe dilatation of aortic root/ascending aorta (5.2 cm; suggest CTA or MRA to further assess); mild AI.    Neuro/Psych    GI/Hepatic GERD  ,  Endo/Other    Renal/GU      Musculoskeletal  (+) Arthritis ,   Abdominal (+) + obese,   Peds  Hematology   Anesthesia Other Findings   Reproductive/Obstetrics                            Anesthesia Physical  Anesthesia Plan  ASA: III  Anesthesia Plan: General   Post-op Pain Management:    Induction: Intravenous  PONV Risk Score and Plan: Ondansetron and Dexamethasone  Airway Management Planned: LMA and Oral ETT  Additional Equipment: None  Intra-op Plan:   Post-operative Plan: Extubation in OR  Informed Consent: I have reviewed the patients History and Physical, chart, labs and discussed the procedure including the risks, benefits and alternatives for the proposed anesthesia with the patient or authorized representative who has indicated his/her understanding and acceptance.     Dental advisory given  Plan Discussed with: CRNA and Anesthesiologist  Anesthesia Plan Comments:         Anesthesia Quick Evaluation

## 2019-05-12 NOTE — Progress Notes (Signed)
2-3 inch sized hematoma present. Pressure held for 10 minutes. Groin soft with bruising present. Dr. Johney Frame notified. Bedrest extended to 1730.

## 2019-05-12 NOTE — Progress Notes (Signed)
Report called to [facility] for 6-E-18 and transferred via bed

## 2019-05-12 NOTE — Anesthesia Procedure Notes (Addendum)
Procedure Name: Intubation Date/Time: 05/12/2019 7:49 AM Performed by: Quentin Ore, CRNA Pre-anesthesia Checklist: Patient identified, Emergency Drugs available, Suction available and Patient being monitored Patient Re-evaluated:Patient Re-evaluated prior to induction Oxygen Delivery Method: Circle system utilized Preoxygenation: Pre-oxygenation with 100% oxygen Induction Type: IV induction Ventilation: Mask ventilation without difficulty Laryngoscope Size: Glidescope and 4 Tube type: Oral Tube size: 7.5 mm Number of attempts: 1 Airway Equipment and Method: Video-laryngoscopy Placement Confirmation: ETT inserted through vocal cords under direct vision,  positive ETCO2 and breath sounds checked- equal and bilateral Secured at: 22 cm Tube secured with: Tape Dental Injury: Teeth and Oropharynx as per pre-operative assessment  Comments: Electively used Glidepro 4 due to neck mobility. Grade I view on screen.

## 2019-05-13 DIAGNOSIS — I2510 Atherosclerotic heart disease of native coronary artery without angina pectoris: Secondary | ICD-10-CM | POA: Diagnosis not present

## 2019-05-13 DIAGNOSIS — I4819 Other persistent atrial fibrillation: Secondary | ICD-10-CM | POA: Diagnosis not present

## 2019-05-13 DIAGNOSIS — I10 Essential (primary) hypertension: Secondary | ICD-10-CM | POA: Diagnosis not present

## 2019-05-13 DIAGNOSIS — Z7901 Long term (current) use of anticoagulants: Secondary | ICD-10-CM | POA: Diagnosis not present

## 2019-05-13 MED ORDER — DIPHENHYDRAMINE HCL 25 MG PO CAPS
25.0000 mg | ORAL_CAPSULE | Freq: Once | ORAL | Status: AC
  Administered 2019-05-13: 04:00:00 25 mg via ORAL
  Filled 2019-05-13: qty 1

## 2019-05-13 MED ORDER — COLCHICINE 0.6 MG PO TABS
1.2000 mg | ORAL_TABLET | Freq: Two times a day (BID) | ORAL | Status: AC
  Administered 2019-05-13 (×2): 1.2 mg via ORAL
  Filled 2019-05-13 (×2): qty 2

## 2019-05-13 MED ORDER — HYDROCORTISONE 1 % EX OINT
TOPICAL_OINTMENT | CUTANEOUS | Status: DC | PRN
  Filled 2019-05-13: qty 28

## 2019-05-13 NOTE — Discharge Summary (Addendum)
ELECTROPHYSIOLOGY PROCEDURE DISCHARGE SUMMARY    Patient ID: Ronald [name],  MRN: [id number], DOB/AGE: Jul 24, 1940 79 y.o.  Admit date: 05/12/2019 Discharge date: 05/13/2019  Primary Care Physician: Crist Infante, MD  Primary Cardiologist: Dr. Angelena Form Electrophysiologist: Thompson Grayer, MD  Primary Discharge Diagnosis:  1. Persistent AFib     CHA2DS2Vasc is 3, on Eliquis appropriately dosed  Secondary Discharge Diagnosis:  1. CAD 2. HTN 3. H/o Bells Palsy (left)   Procedures This Admission:  1.  Electrophysiology study and radiofrequency catheter ablation on 05/12/19 by Dr Thompson Grayer.   This study demonstrated   CONCLUSIONS: 1. Atrial fibrillation upon presentation.   2. Intracardiac echo reveals a moderate sized left atrium with four separate pulmonary veins without evidence of pulmonary vein stenosis.  There was an accessory left atrial appendage just anterior to the ostium of the right superior pulmonary vein.  The coronary sinus ostium was large.  The aortic valve and aortic root were also large. 3. Successful electrical isolation and anatomical encircling of all four pulmonary veins with radiofrequency current.  A WACA approach was used 3. Additional left atrial ablation was performed with a standard box lesion created along the posterior wall of the left atrium 4. Atrial fibrillation successfully cardioverted to sinus rhythm. 5. No early apparent complications.    Brief HPI: Ronald [name] is a 79 y.o. male with a history of persistent atrial fibrillation.  He has failed medical therapy with amiodarone. Risks, benefits, and alternatives to catheter ablation of atrial fibrillation were reviewed with the patient who wished to proceed.  The patient underwent cardiac CT prior to the procedure which demonstrated no LAA thrombus.     Hospital Course:  The patient was admitted and underwent EPS/RFCA of atrial fibrillation with details as outlined above.  He was planned for  same day discharge though had continued intermittent bleeding at his R groin and held overnight for observation.  The patient was monitored on telemetry overnight which demonstrated SR.   R groin is stable this AM, no hematoma, soft, no bleeding, + ecchymosis.  The patient had some pleuritic CP treated with colchicine EKG without ischemic changes. He feels well this morning without CP or SOB, slight tenderness at his R groin, no resting pain, he was examined by Dr. Rayann Heman and considered to be stable for discharge.  Wound care and restrictions were reviewed with the patient.  The patient will be seen back by the AFib clinic  in 4 weeks and Dr Rayann Heman in 12 weeks for post ablation follow up.     Physical Exam: Vitals:   05/12/19 2355 05/13/19 0055 05/13/19 0128 05/13/19 0428  BP: 119/77 (!) 100/59 116/71 109/77  Pulse: 86 90 86 95  Resp: 16 18  16   Temp:    98.1 F (36.7 C)  TempSrc:    Oral  SpO2: 96% 98% 97% 94%  Weight:    127 kg  Height:        GEN- The patient is well appearing, alert and oriented x 3 today.   HEENT: normocephalic, atraumatic; sclera clear, conjunctiva pink; hearing intact; oropharynx clear; neck supple  Lungs- CTA b/l, normal work of breathing.  No wheezes, rales, rhonchi Heart- RRR, no murmurs, no rubs or gallops, heart sounds are not distant  GI- soft, non-tender, non-distended Extremities- no clubbing, cyanosis, or edema; R groin is soft, no hematoma, large area of ecchymosis at site/inf-medially, no bruit MS- no significant deformity or atrophy Skin- warm and dry, no rash  or lesion Psych- euthymic mood, full affect Neuro- strength and sensation are intact   Labs:   Lab Results  Component Value Date   WBC 8.8 04/30/2019   HGB 16.3 04/30/2019   HCT 48.2 04/30/2019   MCV 91 04/30/2019   PLT 257 04/30/2019   No results for input(s): NA, K, CL, CO2, BUN, CREATININE, CALCIUM, PROT, BILITOT, ALKPHOS, ALT, AST, GLUCOSE in the last 168 hours.  Invalid  input(s): LABALBU   Discharge Medications:  Allergies as of 05/13/2019      Reactions   Quinolones Hypertension   Patient was warned about not using Cipro and similar antibiotics. Recent studies have raised concern that fluoroquinolone antibiotics could be associated with an increased risk of aortic aneurysm Fluoroquinolones have non-antimicrobial properties that might jeopardise the integrity of the extracellular matrix of the vascular wall In a  propensity score matched cohort study in [place], there was a 66% increased rate of aortic aneurysm or dissection associated with oral fluoroquinolone use, compared wit   Promethazine Hcl Other (See Comments)   Agitation and incoherence (phenergan)   Vesicare [solifenacin Succinate] Itching, Rash      Medication List    STOP taking these medications   aspirin EC 81 MG tablet     TAKE these medications   acetaminophen 500 MG tablet Commonly known as: TYLENOL Take 1,000 mg by mouth every 6 (six) hours as needed for moderate pain or headache.   amiodarone 200 MG tablet Commonly known as: PACERONE Take 1 tablet (200 mg total) by mouth daily.   apixaban 5 MG Tabs tablet Commonly known as: ELIQUIS Take 1 tablet (5 mg total) by mouth 2 (two) times daily.   chlorthalidone 25 MG tablet Commonly known as: HYGROTON Take 25 mg by mouth daily.   clobetasol ointment 0.05 % Commonly known as: TEMOVATE Apply 1 application topically 2 (two) times daily as needed (itchy scaly on legs).   colesevelam 625 MG tablet Commonly known as: WELCHOL Take 3,750 mg by mouth daily after supper. 6 tablets   CoQ-10 200 MG Caps Take 200 mg by mouth daily.   ezetimibe 10 MG tablet Commonly known as: ZETIA Take 5 mg by mouth every evening.   fluticasone 50 MCG/ACT nasal spray Commonly known as: FLONASE Place 1 spray into both nostrils daily as needed for allergies or rhinitis.   Glucosamine Chondr 1500 Complx Caps Take 2 capsules by mouth every  morning.   hydrocortisone 2.5 % cream Apply 1 application topically as needed (eye brows flakey). Use on head   ketoconazole 2 % cream Commonly known as: NIZORAL Apply 1 application topically daily as needed for irritation (Corner of mouth).   lisinopril 40 MG tablet Commonly known as: ZESTRIL Take 40 mg by mouth every evening.   Livalo 4 MG Tabs Generic drug: Pitavastatin Calcium Take 2 mg by mouth at bedtime.   multivitamin with minerals Tabs tablet Take 1 tablet by mouth every morning.   mupirocin ointment 2 % Commonly known as: BACTROBAN Place 1 application into the nose daily as needed (Tic bits).   nitroGLYCERIN 0.4 MG SL tablet Commonly known as: NITROSTAT Place 1 tablet (0.4 mg total) under the tongue every 5 (five) minutes as needed for chest pain.   omeprazole 20 MG capsule Commonly known as: PRILOSEC Take 20 mg by mouth daily.   SOOLANTRA EX Apply 1 application topically daily as needed (Rosachea).       Disposition:  Home  Discharge Instructions    Diet - low  sodium heart healthy   Complete by: As directed    Increase activity slowly   Complete by: As directed      Follow-up Information    [facility] Follow up.   Specialty: Cardiology Why: 06/09/2019 @ 11:30AM, with Rudi Coco, NP Contact information: [address] [id number] [id number] [place] [address] [phone]       Hillis Range, MD Follow up.   Specialty: Cardiology Why: 08/10/2019 @ 11:45AM Contact information: [address] [place] [address] [phone]           Duration of Discharge Encounter: Greater than 30 minutes including physician time.  SignedFrancis Dowse, PA-C 05/13/2019 8:41 AM  I have seen, examined the patient, and reviewed the above assessment and plan.  Changes to above are made where necessary.  On exam, RRR.  Moderate R groin ecchymosis.  DC to home with routine follow-up.  Co Sign: Hillis Range, MD 05/13/2019 5:37 PM

## 2019-05-13 NOTE — Plan of Care (Addendum)
  Problem: Education: Goal: Knowledge of General Education information will improve Description: Including pain rating scale, medication(s)/side effects and non-pharmacologic comfort measures 05/13/2019 1145 by Hoover Brunette, RN Outcome: Completed/Met 05/13/2019 1059 by Hoover Brunette, RN Outcome: Adequate for Discharge   Problem: Clinical Measurements: Goal: Will remain free from infection 05/13/2019 1145 by Hoover Brunette, RN Outcome: Completed/Met 05/13/2019 1059 by Hoover Brunette, RN Outcome: Adequate for Discharge   Problem: Pain Managment: Goal: General experience of comfort will improve Outcome: Completed/Met   Problem: Health Behavior/ Discharge Planning: Goal: Ability to safely manage health related needs after discharge Outcome: Completed/Met   Problem: Cardiac: Goal: Ability to maintain adequate cardiovascular perfusion will improve Outcome: Completed/Met Goal: Vascular access site(s) Level 0-1 will be maintained Outcome: Completed/Met   Problem: Activity: Goal: Ability to return to baseline activity level will improve Outcome: Completed/Met

## 2019-05-13 NOTE — Plan of Care (Signed)
  Problem: Education: Goal: Knowledge of General Education information will improve Description: Including pain rating scale, medication(s)/side effects and non-pharmacologic comfort measures Outcome: Adequate for Discharge   Problem: Clinical Measurements: Goal: Will remain free from infection Outcome: Adequate for Discharge   Problem: Pain Managment: Goal: General experience of comfort will improve Outcome: Completed/Met   Problem: Safety: Goal: Ability to remain free from injury will improve Outcome: Completed/Met   Problem: Education: Goal: Understanding of disease, treatment, and recovery process will improve Outcome: Completed/Met   Problem: Activity: Goal: Ability to return to baseline activity level will improve Outcome: Completed/Met   Problem: Cardiac: Goal: Ability to maintain adequate cardiovascular perfusion will improve Outcome: Completed/Met Goal: Vascular access site(s) Level 0-1 will be maintained Outcome: Completed/Met   Problem: Health Behavior/ Discharge Planning: Goal: Ability to safely manage health related needs after discharge Outcome: Completed/Met

## 2019-05-13 NOTE — Progress Notes (Deleted)
Pt complains of chest pain with pain scale 6/10. Pt stated " It hurts every time I breath." VS taken and 12 L EKG done. Hydrocodone PO given as ordered with no relief.  Dr. Okey Dupre was notified and ordered Colchicine PO. Will continue to monitor pt.

## 2019-05-13 NOTE — Progress Notes (Signed)
Pt complains of chest pain with pain scale 6/10. Pt stated " It hurts every time I breath." VS taken and 12 L EKG done. Hydrocodone PO given as ordered with no relief.  Dr. Okey Dupre was notified and ordered Colchicine PO. Will continue to monitor pt   05/13/19 0055  Vitals  BP (!) 100/59  MAP (mmHg) 72  BP Location Right Arm  BP Method Automatic  Patient Position (if appropriate) Lying  Pulse Rate 90  ECG Heart Rate 90  Resp 18  Oxygen Therapy  SpO2 98 %  O2 Device Room Air  MEWS Score  MEWS Temp 0  MEWS Systolic 1  MEWS Pulse 0  MEWS RR 0  MEWS LOC 0  MEWS Score 1  MEWS Score Color Green

## 2019-05-14 NOTE — Anesthesia Postprocedure Evaluation (Signed)
Anesthesia Post Note  Patient: Ronald [name]  Procedure(s) Performed: ATRIAL FIBRILLATION ABLATION (N/A )     Patient location during evaluation: PACU Anesthesia Type: General Level of consciousness: sedated and patient cooperative Pain management: pain level controlled Vital Signs Assessment: post-procedure vital signs reviewed and stable Respiratory status: spontaneous breathing Cardiovascular status: stable Anesthetic complications: no    Last Vitals:  Vitals:   05/13/19 0428 05/13/19 0712  BP: 109/77 106/69  Pulse: 95 82  Resp: 16   Temp: 36.7 C   SpO2: 94% 96%    Last Pain:  Vitals:   05/13/19 0712  TempSrc:   PainSc: 0-No pain                 Lewie Loron

## 2019-05-17 ENCOUNTER — Ambulatory Visit: Payer: Medicare PPO | Attending: Internal Medicine

## 2019-05-17 DIAGNOSIS — Z23 Encounter for immunization: Secondary | ICD-10-CM

## 2019-05-17 NOTE — Progress Notes (Signed)
   Covid-19 Vaccination Clinic  Name:  Ronald [name]    MRN: [id number] DOB: May 28, 1940  05/17/2019  [name] was observed post Covid-19 immunization for 15 minutes without incidence. He was provided with Vaccine Information Sheet and instruction to access the V-Safe system.   [name] was instructed to call 911 with any severe reactions post vaccine: Marland Kitchen Difficulty breathing  . Swelling of your face and throat  . A fast heartbeat  . A bad rash all over your body  . Dizziness and weakness    Immunizations Administered    Name Date Dose VIS Date Route   Pfizer COVID-19 Vaccine 05/17/2019  8:12 AM 0.3 mL 03/20/2019 Intramuscular   Manufacturer: [employer], [employer]   Lot: [id number]   NDC: [address]-9

## 2019-05-19 ENCOUNTER — Ambulatory Visit: Payer: Medicare PPO

## 2019-05-20 ENCOUNTER — Telehealth: Payer: Self-pay | Admitting: Internal Medicine

## 2019-05-20 NOTE — Telephone Encounter (Signed)
I spoke to the patient who is calling because he still has a "marble" sized knot at his right groin following his ablation.    He said that there had been a few knots, but most have gone away.  He stated that they had difficulty stopping the bleeding and he is "black and blue" in the area.  It is sore to the touch and just wanted further advisement.  I mentioned that usually over time the knot dissipates and coloring improves.  I told him that I would forward to Dr Jenel Lucks nurse for f/u.  He verbalized understanding.

## 2019-05-20 NOTE — Telephone Encounter (Signed)
New message   Patient states that he has know near his groin and wants to know if this will go away. The patient states that he believes it came from the ablation surgery. Please advise.

## 2019-05-21 NOTE — Telephone Encounter (Signed)
Left detailed message for Pt  Advised everything he described was normal post ablation.  Advised to call office if the knot gets bigger, or increased pain/swelling/bleeding.  Advised bruising after this procedure normal as well as knot.  Advised can take a few months for knot to completely resolve.  Advised to call office if any further questions.

## 2019-06-09 ENCOUNTER — Other Ambulatory Visit: Payer: Self-pay

## 2019-06-09 ENCOUNTER — Ambulatory Visit (HOSPITAL_COMMUNITY)
Admission: RE | Admit: 2019-06-09 | Discharge: 2019-06-09 | Disposition: A | Discharge status: Home / Self Care | Payer: Medicare PPO | Source: Ambulatory Visit | Attending: Physician Assistant | Admitting: Physician Assistant

## 2019-06-09 VITALS — BP 122/70 | HR 80 | Ht 79.0 in | Wt 282.8 lb

## 2019-06-09 DIAGNOSIS — K219 Gastro-esophageal reflux disease without esophagitis: Secondary | ICD-10-CM | POA: Insufficient documentation

## 2019-06-09 DIAGNOSIS — I10 Essential (primary) hypertension: Secondary | ICD-10-CM | POA: Diagnosis not present

## 2019-06-09 DIAGNOSIS — I712 Thoracic aortic aneurysm, without rupture: Secondary | ICD-10-CM | POA: Insufficient documentation

## 2019-06-09 DIAGNOSIS — E669 Obesity, unspecified: Secondary | ICD-10-CM | POA: Insufficient documentation

## 2019-06-09 DIAGNOSIS — E785 Hyperlipidemia, unspecified: Secondary | ICD-10-CM | POA: Diagnosis not present

## 2019-06-09 DIAGNOSIS — Z8249 Family history of ischemic heart disease and other diseases of the circulatory system: Secondary | ICD-10-CM | POA: Insufficient documentation

## 2019-06-09 DIAGNOSIS — D6869 Other thrombophilia: Secondary | ICD-10-CM | POA: Diagnosis not present

## 2019-06-09 DIAGNOSIS — Z955 Presence of coronary angioplasty implant and graft: Secondary | ICD-10-CM | POA: Diagnosis not present

## 2019-06-09 DIAGNOSIS — Z79899 Other long term (current) drug therapy: Secondary | ICD-10-CM | POA: Diagnosis not present

## 2019-06-09 DIAGNOSIS — Z6831 Body mass index (BMI) 31.0-31.9, adult: Secondary | ICD-10-CM | POA: Insufficient documentation

## 2019-06-09 DIAGNOSIS — Z801 Family history of malignant neoplasm of trachea, bronchus and lung: Secondary | ICD-10-CM | POA: Diagnosis not present

## 2019-06-09 DIAGNOSIS — I4819 Other persistent atrial fibrillation: Secondary | ICD-10-CM | POA: Insufficient documentation

## 2019-06-09 DIAGNOSIS — Z7901 Long term (current) use of anticoagulants: Secondary | ICD-10-CM | POA: Diagnosis not present

## 2019-06-09 DIAGNOSIS — I2510 Atherosclerotic heart disease of native coronary artery without angina pectoris: Secondary | ICD-10-CM | POA: Diagnosis not present

## 2019-06-09 DIAGNOSIS — Z888 Allergy status to other drugs, medicaments and biological substances status: Secondary | ICD-10-CM | POA: Insufficient documentation

## 2019-06-09 DIAGNOSIS — Z87891 Personal history of nicotine dependence: Secondary | ICD-10-CM | POA: Insufficient documentation

## 2019-06-09 LAB — CBC
HCT: 46.6 % (ref 39.0–52.0)
Hemoglobin: 15.2 g/dL (ref 13.0–17.0)
MCH: 30.8 pg (ref 26.0–34.0)
MCHC: 32.6 g/dL (ref 30.0–36.0)
MCV: 94.5 fL (ref 80.0–100.0)
Platelets: 205 10*3/uL (ref 150–400)
RBC: 4.93 MIL/uL (ref 4.22–5.81)
RDW: 13.1 % (ref 11.5–15.5)
WBC: 8.4 10*3/uL (ref 4.0–10.5)
nRBC: 0 % (ref 0.0–0.2)

## 2019-06-09 LAB — COMPREHENSIVE METABOLIC PANEL
ALT: 26 U/L (ref 0–44)
AST: 24 U/L (ref 15–41)
Albumin: 3.9 g/dL (ref 3.5–5.0)
Alkaline Phosphatase: 72 U/L (ref 38–126)
Anion gap: 9 (ref 5–15)
BUN: 30 mg/dL — ABNORMAL HIGH (ref 8–23)
CO2: 29 mmol/L (ref 22–32)
Calcium: 9.4 mg/dL (ref 8.9–10.3)
Chloride: 103 mmol/L (ref 98–111)
Creatinine, Ser: 1.32 mg/dL — ABNORMAL HIGH (ref 0.61–1.24)
GFR calc Af Amer: >60 mL/min (ref >60)
GFR calc non Af Amer: 52 mL/min — ABNORMAL LOW (ref >60)
Glucose, Bld: 102 mg/dL — ABNORMAL HIGH (ref 70–99)
Potassium: 4.5 mmol/L (ref 3.5–5.1)
Sodium: 141 mmol/L (ref 135–145)
Total Bilirubin: 1 mg/dL (ref 0.3–1.2)
Total Protein: 6.9 g/dL (ref 6.5–8.1)

## 2019-06-09 LAB — TSH: TSH: 4.472 u[IU]/mL (ref 0.350–4.500)

## 2019-06-09 NOTE — Progress Notes (Signed)
Primary Care Physician: [name], MD Primary Cardiologist: Dr Clifton Kylyn Primary Electrophysiologist: Dr Johney Frame Referring Physician: Dr Herbert Pun is a 79 y.o. male with a history of CAD s/p stent RCA 2004, HTN, HLD, thoracic aortic aneurysm(followed by Dr. Tyrone Sage), and persistent atrial fibrillation who presents for follow up in the [facility].  The patient was initially diagnosed with atrial fibrillation on 02/17/19 after presenting with symptoms of SOB and was found to be in rate controlled afib. He was started on Eliquis for a CHADS2VASC score of 4. He underwent DCCV on 03/13/19. He reports that he had some minimal improvement in his symptoms of dyspnea but it is still persistent. He denies significant snoring or alcohol use. Patient is s/p DCCV on 04/15/19. Patient reports that he was in SR for 4 days with no symptoms of SOB and greatly increased energy level. Unfortunately, he had return of his afib.  On follow up today, patient is s/p afib ablation with Dr Johney Frame on 05/12/19. He denies any CP, swallowing, or groin issues. He continues to have SOB with exertion and he is in rate controlled afib today. He is unsure when he went into afib.    Today, he denies symptoms of palpitations, chest pain, orthopnea, PND, lower extremity edema, dizziness, presyncope, syncope, snoring, daytime somnolence, bleeding, or neurologic sequela. The patient is tolerating medications without difficulties and is otherwise without complaint today.    Atrial Fibrillation Risk Factors:  he does not have symptoms or diagnosis of sleep apnea. he does not have a history of rheumatic fever. he does not have a history of alcohol use. The patient does have a history of early familial atrial fibrillation or other arrhythmias. Mother had afib.  he has a BMI of Body mass index is 31.86 kg/m.Marland Kitchen Filed Weights   06/09/19 1136  Weight: 128.3 kg    Family History  Problem  Relation Age of Onset  . Heart disease Father   . Heart attack Father        40  . Cancer Mother 39       LUNG  . Hypertension Neg Hx   . Stroke Neg Hx      Atrial Fibrillation Management history:  Previous antiarrhythmic drugs: amiodarone Previous cardioversions: 03/13/19, 04/15/19 Previous ablations: 05/12/19 CHADS2VASC score: 4 Anticoagulation history: Eliquis   Past Medical History:  Diagnosis Date  . Arthritis   . Coronary artery disease    Stent 2004  . GERD (gastroesophageal reflux disease)   . History of echocardiogram    Echo 5/17: mild LVH, EF 50-55%, no RWMA, Gr 2 DD, mild to mod AI, PASP 32 mmHg  . History of nuclear stress test    Myoview 5/17 - EF 48%, no scar, no ischemia. Low Risk  . History of pneumonia    79 years old  . Hx of Bell's palsy    left face  . Hypercholesteremia    managed by dr Waynard Edwards  . Hypertension   . 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   . Lung nodules    noted on past CT scans  . Malaria   . SVT (supraventricular tachycardia) (HCC)    remote episode during exercise test  . Thoracic aortic aneurysm [facility])    last scan in Jan 2013. Now measuring 5.0cm. Referred to Dr. Tyrone Sage  .  Wears dentures    bottom  . Wears glasses    Past Surgical History:  Procedure Laterality Date  . ATRIAL FIBRILLATION ABLATION N/A 05/12/2019   Procedure: ATRIAL FIBRILLATION ABLATION;  Surgeon: Hillis Range, MD;  Location: [facility] INVASIVE CV LAB;  Service: Cardiovascular;  Laterality: N/A;  . CARDIOVERSION N/A 03/13/2019   Procedure: CARDIOVERSION;  Surgeon: Pricilla Riffle, MD;  Location: [facility] ENDOSCOPY;  Service: Cardiovascular;  Laterality: N/A;  . CARDIOVERSION N/A 04/15/2019   Procedure: CARDIOVERSION;  Surgeon: Vesta Mixer, MD;  Location: [facility] ENDOSCOPY;  Service: Cardiovascular;  Laterality: N/A;  . CERVICAL FUSION  03-2010   c2-c7  . COLONOSCOPY     x4  .  CORONARY STENT PLACEMENT  2004  . dental implant     x 2  . ELBOW SURGERY  1997   lt and rt  . EYE SURGERY    . HIP PINNING,CANNULATED Right 05/11/2014   Procedure: CANNULATED HIP PINNING;  Surgeon: Sheral Apley, MD;  Location: [facility] OR;  Service: Orthopedics;  Laterality: Right;  . RETINAL DETACHMENT SURGERY  1975   lt  . rotator cuff surgery  1997   lt  . SHOULDER ARTHROSCOPY  2/15   right-got cardiac clearance-gsc  . TONSILLECTOMY    . TRIGGER FINGER RELEASE Left 10/12/2013   Procedure: LEFT LONG FINGER TRIGGER RELEASE;  Surgeon: Jodi Marble, MD;  Location: [facility];  Service: Orthopedics;  Laterality: Left;    Current Outpatient Medications  Medication Sig Dispense Refill  . amiodarone (PACERONE) 200 MG tablet Take 1 tablet (200 mg total) by mouth daily. 90 tablet 3  . apixaban (ELIQUIS) 5 MG TABS tablet Take 1 tablet (5 mg total) by mouth 2 (two) times daily. 60 tablet 5  . chlorthalidone (HYGROTON) 25 MG tablet Take 25 mg by mouth daily.     . clobetasol ointment (TEMOVATE) 0.05 % Apply 1 application topically 2 (two) times daily as needed (itchy scaly on legs).     . Coenzyme Q10 (COQ-10) 200 MG CAPS Take 200 mg by mouth daily.     . colesevelam (WELCHOL) 625 MG tablet Take 3,750 mg by mouth daily after supper. 6 tablets    . ezetimibe (ZETIA) 10 MG tablet Take 5 mg by mouth every evening.     . fluticasone (FLONASE) 50 MCG/ACT nasal spray Place 1 spray into both nostrils daily as needed for allergies or rhinitis.    . Glucosamine-Chondroit-Vit C-Mn (GLUCOSAMINE CHONDR 1500 COMPLX) CAPS Take 2 capsules by mouth every morning.     . hydrocortisone 2.5 % cream Apply 1 application topically as needed (eye brows flakey). Use on head    . Ivermectin (SOOLANTRA EX) Apply 1 application topically daily as needed (Rosachea).    Marland Kitchen ketoconazole (NIZORAL) 2 % cream Apply 1 application topically daily as needed for irritation (Corner of mouth).     . lisinopril  (PRINIVIL,ZESTRIL) 40 MG tablet Take 40 mg by mouth every evening.     Marland Kitchen LIVALO 4 MG TABS Take 2 mg by mouth at bedtime.     . Multiple Vitamin (MULTIVITAMIN WITH MINERALS) TABS tablet Take 1 tablet by mouth every morning.     . mupirocin ointment (BACTROBAN) 2 % Place 1 application into the nose daily as needed (Tic bits).    . nitroGLYCERIN (NITROSTAT) 0.4 MG SL tablet Place 1 tablet (0.4 mg total) under the tongue every 5 (five) minutes as needed for chest pain. 25 tablet 1  . omeprazole (PRILOSEC)  20 MG capsule Take 20 mg by mouth daily.      No current facility-administered medications for this encounter.    Allergies  Allergen Reactions  . Quinolones Hypertension    Patient was warned about not using Cipro and similar antibiotics. Recent studies have raised concern that fluoroquinolone antibiotics could be associated with an increased risk of aortic aneurysm Fluoroquinolones have non-antimicrobial properties that might jeopardise the integrity of the extracellular matrix of the vascular wall In a  propensity score matched cohort study in [place], there was a 66% increased rate of aortic aneurysm or dissection associated with oral fluoroquinolone use, compared wit  . Promethazine Hcl Other (See Comments)    Agitation and incoherence (phenergan)  . Vesicare [Solifenacin Succinate] Itching and Rash    Social History   Socioeconomic History  . Marital status: Married    Spouse name: Not on file  . Number of children: 1  . Years of education: Not on file  . Highest education level: Not on file  Occupational History  . Occupation: retired Games developer: RETIRED  Tobacco Use  . Smoking status: Former Smoker    Quit date: 04/09/1969    Years since quitting: 50.2  . Smokeless tobacco: Current User    Types: Chew  . Tobacco comment: reports that when he smoked it was intermittent  Substance and Sexual Activity  . Alcohol use: Yes    Alcohol/week: 1.0 standard drinks     Types: 1 Standard drinks or equivalent per week    Comment: occasional mainly during holiday  . Drug use: No  . Sexual activity: Yes  Other Topics Concern  . Not on file  Social History Narrative   Lives in [place]   Retired Patent attorney (Nurse, children's at [employer])   Social Determinants of Health   Financial Resource Strain:   . Difficulty of Paying Living Expenses: Not on file  Food Insecurity:   . Worried About Charity fundraiser in the Last Year: Not on file  . Ran Out of Food in the Last Year: Not on file  Transportation Needs:   . Lack of Transportation (Medical): Not on file  . Lack of Transportation (Non-Medical): Not on file  Physical Activity:   . Days of Exercise per Week: Not on file  . Minutes of Exercise per Session: Not on file  Stress:   . Feeling of Stress : Not on file  Social Connections:   . Frequency of Communication with Friends and Family: Not on file  . Frequency of Social Gatherings with Friends and Family: Not on file  . Attends Religious Services: Not on file  . Active Member of Clubs or Organizations: Not on file  . Attends Archivist Meetings: Not on file  . Marital Status: Not on file  Intimate Partner Violence:   . Fear of Current or Ex-Partner: Not on file  . Emotionally Abused: Not on file  . Physically Abused: Not on file  . Sexually Abused: Not on file     ROS- All systems are reviewed and negative except as per the HPI above.  Physical Exam: Vitals:   06/09/19 1136  BP: 122/70  Pulse: 80  Weight: 128.3 kg  Height: 6\' 7"  (2.007 m)    GEN- The patient is well appearing obese elderly male, alert and oriented x 3 today.   HEENT-head normocephalic, atraumatic, sclera clear, conjunctiva pink, hearing intact, trachea midline. Lungs- Clear  to ausculation bilaterally, normal work of breathing Heart- irregular rate and rhythm, no murmurs, rubs or gallops  GI- soft, NT, ND, + BS  Extremities- no clubbing, cyanosis, or edema MS- no significant deformity or atrophy Skin- no rash or lesion Psych- euthymic mood, full affect Neuro- strength and sensation are intact   Wt Readings from Last 3 Encounters:  06/09/19 128.3 kg  05/13/19 127 kg  05/11/19 127.5 kg    EKG today demonstrates afib HR 80, inc RBBB, LAFB, QRS 118, QTc 449  Echo 02/26/19 demonstrated   1. Left ventricular ejection fraction, by visual estimation, is 50 to 55%. The left ventricle has normal function. There is moderately increased left ventricular hypertrophy.  2. Definity contrast agent was given IV to delineate the left ventricular endocardial borders.  3. Left ventricular diastolic function could not be evaluated.  4. Global right ventricle has normal systolic function.The right ventricular size is normal.  5. Left atrial size was normal.  6. Right atrial size was normal.  7. The mitral valve is normal in structure. Trace mitral valve regurgitation. No evidence of mitral stenosis.  8. The tricuspid valve is normal in structure. Tricuspid valve regurgitation is trivial.  9. The aortic valve is tricuspid. Aortic valve regurgitation is mild. Mild aortic valve sclerosis without stenosis. 10. The pulmonic valve was not well visualized. Pulmonic valve regurgitation is not visualized. 11. Aortic dilatation noted. 12. 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 13. The inferior vena cava is normal in size with greater than 50% respiratory variability, suggesting right atrial pressure of 3 mmHg. 14. Definity used; normal LV systolic function; moderate LVH; moderate to severe dilatation of aortic root/ascending aorta (5.2 cm; suggest CTA or MRA to further assess); mild AI.  LA 4.8 cm  Epic records are reviewed at length today  Assessment and Plan:  1. Persistent atrial fibrillation S/p DCCV on 03/13/19 with ERAF. Loaded on amiodarone and  underwent repeat DCCV on 04/15/19. S/p afib ablation 05/12/19 with Dr Johney Frame. Unfortunately, he is back in rate controlled afib. Will plan for DCCV. Continue amiodarone 200 mg daily. Check Cmet/TSH/CBC Continue Eliquis 5 mg BID He has been intolerant of BB in the past 2/2 bradycardia.  This patients CHA2DS2-VASc Score and unadjusted Ischemic Stroke Rate (% per year) is equal to 4.8 % stroke rate/year from a score of 4  Above score calculated as 1 point each if present [CHF, HTN, DM, Vascular=MI/PAD/Aortic Plaque, Age if 65-74, or Male] Above score calculated as 2 points each if present [Age > 75, or Stroke/TIA/TE]   2. Obesity Body mass index is 31.86 kg/m. Lifestyle modification was discussed and encouraged including regular physical activity and weight reduction.  3. HTN Stable, no changes today.  4. CAD S/p RCA stenting 2004. No anginal symptoms. Followed by Dr Clifton Jerred.   Follow up in the AF clinic one week post DCCV.    [name] PA-C [facility] [address] [place], [place] [address] [phone] 06/09/2019 12:26 PM

## 2019-06-09 NOTE — H&P (View-Only) (Signed)
Primary Care Physician: [name], MD Primary Cardiologist: Dr Clifton Kylyn Primary Electrophysiologist: Dr Johney Frame Referring Physician: Dr Herbert Pun is a 79 y.o. male with a history of CAD s/p stent RCA 2004, HTN, HLD, thoracic aortic aneurysm(followed by Dr. Tyrone Sage), and persistent atrial fibrillation who presents for follow up in the [facility].  The patient was initially diagnosed with atrial fibrillation on 02/17/19 after presenting with symptoms of SOB and was found to be in rate controlled afib. He was started on Eliquis for a CHADS2VASC score of 4. He underwent DCCV on 03/13/19. He reports that he had some minimal improvement in his symptoms of dyspnea but it is still persistent. He denies significant snoring or alcohol use. Patient is s/p DCCV on 04/15/19. Patient reports that he was in SR for 4 days with no symptoms of SOB and greatly increased energy level. Unfortunately, he had return of his afib.  On follow up today, patient is s/p afib ablation with Dr Johney Frame on 05/12/19. He denies any CP, swallowing, or groin issues. He continues to have SOB with exertion and he is in rate controlled afib today. He is unsure when he went into afib.    Today, he denies symptoms of palpitations, chest pain, orthopnea, PND, lower extremity edema, dizziness, presyncope, syncope, snoring, daytime somnolence, bleeding, or neurologic sequela. The patient is tolerating medications without difficulties and is otherwise without complaint today.    Atrial Fibrillation Risk Factors:  he does not have symptoms or diagnosis of sleep apnea. he does not have a history of rheumatic fever. he does not have a history of alcohol use. The patient does have a history of early familial atrial fibrillation or other arrhythmias. Mother had afib.  he has a BMI of Body mass index is 31.86 kg/m.Marland Kitchen Filed Weights   06/09/19 1136  Weight: 128.3 kg    Family History  Problem  Relation Age of Onset  . Heart disease Father   . Heart attack Father        40  . Cancer Mother 39       LUNG  . Hypertension Neg Hx   . Stroke Neg Hx      Atrial Fibrillation Management history:  Previous antiarrhythmic drugs: amiodarone Previous cardioversions: 03/13/19, 04/15/19 Previous ablations: 05/12/19 CHADS2VASC score: 4 Anticoagulation history: Eliquis   Past Medical History:  Diagnosis Date  . Arthritis   . Coronary artery disease    Stent 2004  . GERD (gastroesophageal reflux disease)   . History of echocardiogram    Echo 5/17: mild LVH, EF 50-55%, no RWMA, Gr 2 DD, mild to mod AI, PASP 32 mmHg  . History of nuclear stress test    Myoview 5/17 - EF 48%, no scar, no ischemia. Low Risk  . History of pneumonia    79 years old  . Hx of Bell's palsy    left face  . Hypercholesteremia    managed by dr Waynard Edwards  . Hypertension   . 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   . Lung nodules    noted on past CT scans  . Malaria   . SVT (supraventricular tachycardia) (HCC)    remote episode during exercise test  . Thoracic aortic aneurysm [facility])    last scan in Jan 2013. Now measuring 5.0cm. Referred to Dr. Tyrone Sage  .  Wears dentures    bottom  . Wears glasses    Past Surgical History:  Procedure Laterality Date  . ATRIAL FIBRILLATION ABLATION N/A 05/12/2019   Procedure: ATRIAL FIBRILLATION ABLATION;  Surgeon: Hillis Range, MD;  Location: [facility] INVASIVE CV LAB;  Service: Cardiovascular;  Laterality: N/A;  . CARDIOVERSION N/A 03/13/2019   Procedure: CARDIOVERSION;  Surgeon: Pricilla Riffle, MD;  Location: [facility] ENDOSCOPY;  Service: Cardiovascular;  Laterality: N/A;  . CARDIOVERSION N/A 04/15/2019   Procedure: CARDIOVERSION;  Surgeon: Vesta Mixer, MD;  Location: [facility] ENDOSCOPY;  Service: Cardiovascular;  Laterality: N/A;  . CERVICAL FUSION  03-2010   c2-c7  . COLONOSCOPY     x4  .  CORONARY STENT PLACEMENT  2004  . dental implant     x 2  . ELBOW SURGERY  1997   lt and rt  . EYE SURGERY    . HIP PINNING,CANNULATED Right 05/11/2014   Procedure: CANNULATED HIP PINNING;  Surgeon: Sheral Apley, MD;  Location: [facility] OR;  Service: Orthopedics;  Laterality: Right;  . RETINAL DETACHMENT SURGERY  1975   lt  . rotator cuff surgery  1997   lt  . SHOULDER ARTHROSCOPY  2/15   right-got cardiac clearance-gsc  . TONSILLECTOMY    . TRIGGER FINGER RELEASE Left 10/12/2013   Procedure: LEFT LONG FINGER TRIGGER RELEASE;  Surgeon: Jodi Marble, MD;  Location: [facility];  Service: Orthopedics;  Laterality: Left;    Current Outpatient Medications  Medication Sig Dispense Refill  . amiodarone (PACERONE) 200 MG tablet Take 1 tablet (200 mg total) by mouth daily. 90 tablet 3  . apixaban (ELIQUIS) 5 MG TABS tablet Take 1 tablet (5 mg total) by mouth 2 (two) times daily. 60 tablet 5  . chlorthalidone (HYGROTON) 25 MG tablet Take 25 mg by mouth daily.     . clobetasol ointment (TEMOVATE) 0.05 % Apply 1 application topically 2 (two) times daily as needed (itchy scaly on legs).     . Coenzyme Q10 (COQ-10) 200 MG CAPS Take 200 mg by mouth daily.     . colesevelam (WELCHOL) 625 MG tablet Take 3,750 mg by mouth daily after supper. 6 tablets    . ezetimibe (ZETIA) 10 MG tablet Take 5 mg by mouth every evening.     . fluticasone (FLONASE) 50 MCG/ACT nasal spray Place 1 spray into both nostrils daily as needed for allergies or rhinitis.    . Glucosamine-Chondroit-Vit C-Mn (GLUCOSAMINE CHONDR 1500 COMPLX) CAPS Take 2 capsules by mouth every morning.     . hydrocortisone 2.5 % cream Apply 1 application topically as needed (eye brows flakey). Use on head    . Ivermectin (SOOLANTRA EX) Apply 1 application topically daily as needed (Rosachea).    Marland Kitchen ketoconazole (NIZORAL) 2 % cream Apply 1 application topically daily as needed for irritation (Corner of mouth).     . lisinopril  (PRINIVIL,ZESTRIL) 40 MG tablet Take 40 mg by mouth every evening.     Marland Kitchen LIVALO 4 MG TABS Take 2 mg by mouth at bedtime.     . Multiple Vitamin (MULTIVITAMIN WITH MINERALS) TABS tablet Take 1 tablet by mouth every morning.     . mupirocin ointment (BACTROBAN) 2 % Place 1 application into the nose daily as needed (Tic bits).    . nitroGLYCERIN (NITROSTAT) 0.4 MG SL tablet Place 1 tablet (0.4 mg total) under the tongue every 5 (five) minutes as needed for chest pain. 25 tablet 1  . omeprazole (PRILOSEC)  20 MG capsule Take 20 mg by mouth daily.      No current facility-administered medications for this encounter.    Allergies  Allergen Reactions  . Quinolones Hypertension    Patient was warned about not using Cipro and similar antibiotics. Recent studies have raised concern that fluoroquinolone antibiotics could be associated with an increased risk of aortic aneurysm Fluoroquinolones have non-antimicrobial properties that might jeopardise the integrity of the extracellular matrix of the vascular wall In a  propensity score matched cohort study in [place], there was a 66% increased rate of aortic aneurysm or dissection associated with oral fluoroquinolone use, compared wit  . Promethazine Hcl Other (See Comments)    Agitation and incoherence (phenergan)  . Vesicare [Solifenacin Succinate] Itching and Rash    Social History   Socioeconomic History  . Marital status: Married    Spouse name: Not on file  . Number of children: 1  . Years of education: Not on file  . Highest education level: Not on file  Occupational History  . Occupation: retired Games developer: RETIRED  Tobacco Use  . Smoking status: Former Smoker    Quit date: 04/09/1969    Years since quitting: 50.2  . Smokeless tobacco: Current User    Types: Chew  . Tobacco comment: reports that when he smoked it was intermittent  Substance and Sexual Activity  . Alcohol use: Yes    Alcohol/week: 1.0 standard drinks     Types: 1 Standard drinks or equivalent per week    Comment: occasional mainly during holiday  . Drug use: No  . Sexual activity: Yes  Other Topics Concern  . Not on file  Social History Narrative   Lives in [place]   Retired Patent attorney (Nurse, children's at [employer])   Social Determinants of Health   Financial Resource Strain:   . Difficulty of Paying Living Expenses: Not on file  Food Insecurity:   . Worried About Charity fundraiser in the Last Year: Not on file  . Ran Out of Food in the Last Year: Not on file  Transportation Needs:   . Lack of Transportation (Medical): Not on file  . Lack of Transportation (Non-Medical): Not on file  Physical Activity:   . Days of Exercise per Week: Not on file  . Minutes of Exercise per Session: Not on file  Stress:   . Feeling of Stress : Not on file  Social Connections:   . Frequency of Communication with Friends and Family: Not on file  . Frequency of Social Gatherings with Friends and Family: Not on file  . Attends Religious Services: Not on file  . Active Member of Clubs or Organizations: Not on file  . Attends Archivist Meetings: Not on file  . Marital Status: Not on file  Intimate Partner Violence:   . Fear of Current or Ex-Partner: Not on file  . Emotionally Abused: Not on file  . Physically Abused: Not on file  . Sexually Abused: Not on file     ROS- All systems are reviewed and negative except as per the HPI above.  Physical Exam: Vitals:   06/09/19 1136  BP: 122/70  Pulse: 80  Weight: 128.3 kg  Height: 6\' 7"  (2.007 m)    GEN- The patient is well appearing obese elderly male, alert and oriented x 3 today.   HEENT-head normocephalic, atraumatic, sclera clear, conjunctiva pink, hearing intact, trachea midline. Lungs- Clear  to ausculation bilaterally, normal work of breathing Heart- irregular rate and rhythm, no murmurs, rubs or gallops  GI- soft, NT, ND, + BS  Extremities- no clubbing, cyanosis, or edema MS- no significant deformity or atrophy Skin- no rash or lesion Psych- euthymic mood, full affect Neuro- strength and sensation are intact   Wt Readings from Last 3 Encounters:  06/09/19 128.3 kg  05/13/19 127 kg  05/11/19 127.5 kg    EKG today demonstrates afib HR 80, inc RBBB, LAFB, QRS 118, QTc 449  Echo 02/26/19 demonstrated   1. Left ventricular ejection fraction, by visual estimation, is 50 to 55%. The left ventricle has normal function. There is moderately increased left ventricular hypertrophy.  2. Definity contrast agent was given IV to delineate the left ventricular endocardial borders.  3. Left ventricular diastolic function could not be evaluated.  4. Global right ventricle has normal systolic function.The right ventricular size is normal.  5. Left atrial size was normal.  6. Right atrial size was normal.  7. The mitral valve is normal in structure. Trace mitral valve regurgitation. No evidence of mitral stenosis.  8. The tricuspid valve is normal in structure. Tricuspid valve regurgitation is trivial.  9. The aortic valve is tricuspid. Aortic valve regurgitation is mild. Mild aortic valve sclerosis without stenosis. 10. The pulmonic valve was not well visualized. Pulmonic valve regurgitation is not visualized. 11. Aortic dilatation noted. 12. 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 13. The inferior vena cava is normal in size with greater than 50% respiratory variability, suggesting right atrial pressure of 3 mmHg. 14. Definity used; normal LV systolic function; moderate LVH; moderate to severe dilatation of aortic root/ascending aorta (5.2 cm; suggest CTA or MRA to further assess); mild AI.  LA 4.8 cm  Epic records are reviewed at length today  Assessment and Plan:  1. Persistent atrial fibrillation S/p DCCV on 03/13/19 with ERAF. Loaded on amiodarone and  underwent repeat DCCV on 04/15/19. S/p afib ablation 05/12/19 with Dr Allred. Unfortunately, he is back in rate controlled afib. Will plan for DCCV. Continue amiodarone 200 mg daily. Check Cmet/TSH/CBC Continue Eliquis 5 mg BID He has been intolerant of BB in the past 2/2 bradycardia.  This patients CHA2DS2-VASc Score and unadjusted Ischemic Stroke Rate (% per year) is equal to 4.8 % stroke rate/year from a score of 4  Above score calculated as 1 point each if present [CHF, HTN, DM, Vascular=MI/PAD/Aortic Plaque, Age if 65-74, or Male] Above score calculated as 2 points each if present [Age > 75, or Stroke/TIA/TE]   2. Obesity Body mass index is 31.86 kg/m. Lifestyle modification was discussed and encouraged including regular physical activity and weight reduction.  3. HTN Stable, no changes today.  4. CAD S/p RCA stenting 2004. No anginal symptoms. Followed by Dr McAlhany.   Follow up in the AF clinic one week post DCCV.    Ricky Rajinder Mesick PA-C [facility] [address] [place], [place] [address] [phone] 06/09/2019 12:26 PM 

## 2019-06-09 NOTE — Patient Instructions (Signed)
Cardioversion scheduled for Tuesday, March 9th  - Arrive at the [facility] and go to admitting at [facility] not eat or drink anything after midnight the night prior to your procedure.  - Take all your morning medication with a sip of water prior to arrival.  - You will not be able to drive home after your procedure.

## 2019-06-12 ENCOUNTER — Other Ambulatory Visit (HOSPITAL_COMMUNITY)
Admission: RE | Admit: 2019-06-12 | Discharge: 2019-06-12 | Disposition: A | Discharge status: Home / Self Care | Payer: Medicare PPO | Source: Ambulatory Visit | Attending: Cardiovascular Disease | Admitting: Cardiovascular Disease

## 2019-06-12 DIAGNOSIS — Z20822 Contact with and (suspected) exposure to covid-19: Secondary | ICD-10-CM | POA: Diagnosis not present

## 2019-06-12 DIAGNOSIS — Z01812 Encounter for preprocedural laboratory examination: Secondary | ICD-10-CM | POA: Insufficient documentation

## 2019-06-12 LAB — SARS CORONAVIRUS 2 (TAT 6-24 HRS): SARS Coronavirus 2: NEGATIVE

## 2019-06-16 ENCOUNTER — Ambulatory Visit (HOSPITAL_COMMUNITY): Payer: Medicare PPO | Admitting: Certified Registered Nurse Anesthetist

## 2019-06-16 ENCOUNTER — Ambulatory Visit (HOSPITAL_COMMUNITY)
Admission: RE | Admit: 2019-06-16 | Discharge: 2019-06-16 | Disposition: A | Discharge status: Home / Self Care | Payer: Medicare PPO | Attending: Cardiovascular Disease | Admitting: Cardiovascular Disease

## 2019-06-16 ENCOUNTER — Encounter (HOSPITAL_COMMUNITY)
Admission: RE | Disposition: A | Discharge status: Home / Self Care | Payer: Self-pay | Source: Home / Self Care | Attending: Cardiovascular Disease

## 2019-06-16 ENCOUNTER — Encounter (HOSPITAL_COMMUNITY): Payer: Self-pay | Admitting: Cardiovascular Disease

## 2019-06-16 DIAGNOSIS — E669 Obesity, unspecified: Secondary | ICD-10-CM | POA: Insufficient documentation

## 2019-06-16 DIAGNOSIS — I471 Supraventricular tachycardia: Secondary | ICD-10-CM | POA: Diagnosis not present

## 2019-06-16 DIAGNOSIS — I4819 Other persistent atrial fibrillation: Secondary | ICD-10-CM

## 2019-06-16 DIAGNOSIS — K219 Gastro-esophageal reflux disease without esophagitis: Secondary | ICD-10-CM | POA: Insufficient documentation

## 2019-06-16 DIAGNOSIS — Z955 Presence of coronary angioplasty implant and graft: Secondary | ICD-10-CM | POA: Insufficient documentation

## 2019-06-16 DIAGNOSIS — Z79899 Other long term (current) drug therapy: Secondary | ICD-10-CM | POA: Insufficient documentation

## 2019-06-16 DIAGNOSIS — I4811 Longstanding persistent atrial fibrillation: Secondary | ICD-10-CM | POA: Diagnosis not present

## 2019-06-16 DIAGNOSIS — Z7901 Long term (current) use of anticoagulants: Secondary | ICD-10-CM | POA: Diagnosis not present

## 2019-06-16 DIAGNOSIS — Z87891 Personal history of nicotine dependence: Secondary | ICD-10-CM | POA: Insufficient documentation

## 2019-06-16 DIAGNOSIS — I712 Thoracic aortic aneurysm, without rupture: Secondary | ICD-10-CM | POA: Diagnosis not present

## 2019-06-16 DIAGNOSIS — E785 Hyperlipidemia, unspecified: Secondary | ICD-10-CM | POA: Insufficient documentation

## 2019-06-16 DIAGNOSIS — E7800 Pure hypercholesterolemia, unspecified: Secondary | ICD-10-CM | POA: Insufficient documentation

## 2019-06-16 DIAGNOSIS — Z6831 Body mass index (BMI) 31.0-31.9, adult: Secondary | ICD-10-CM | POA: Insufficient documentation

## 2019-06-16 DIAGNOSIS — I10 Essential (primary) hypertension: Secondary | ICD-10-CM | POA: Diagnosis not present

## 2019-06-16 DIAGNOSIS — I2510 Atherosclerotic heart disease of native coronary artery without angina pectoris: Secondary | ICD-10-CM | POA: Insufficient documentation

## 2019-06-16 DIAGNOSIS — I259 Chronic ischemic heart disease, unspecified: Secondary | ICD-10-CM | POA: Insufficient documentation

## 2019-06-16 HISTORY — PX: CARDIOVERSION: SHX1299

## 2019-06-16 SURGERY — CARDIOVERSION
Anesthesia: General

## 2019-06-16 MED ORDER — PROPOFOL 10 MG/ML IV BOLUS
INTRAVENOUS | Status: DC | PRN
  Administered 2019-06-16: 60 mg via INTRAVENOUS

## 2019-06-16 MED ORDER — LIDOCAINE 2% (20 MG/ML) 5 ML SYRINGE
INTRAMUSCULAR | Status: DC | PRN
  Administered 2019-06-16: 60 mg via INTRAVENOUS

## 2019-06-16 MED ORDER — SODIUM CHLORIDE 0.9 % IV SOLN: INTRAVENOUS | Status: DC | PRN

## 2019-06-16 NOTE — Interval H&P Note (Signed)
History and Physical Interval Note:  06/16/2019 7:53 AM  Ronald [name]  has presented today for surgery, with the diagnosis of AFIB.  The various methods of treatment have been discussed with the patient and family. After consideration of risks, benefits and other options for treatment, the patient has consented to  Procedure(s): CARDIOVERSION (N/A) as a surgical intervention.  The patient's history has been reviewed, patient examined, no change in status, stable for surgery.  I have reviewed the patient's chart and labs.  Questions were answered to the patient's satisfaction.     Chilton Si, MD

## 2019-06-16 NOTE — Anesthesia Preprocedure Evaluation (Signed)
Anesthesia Evaluation  Patient identified by MRN, date of birth, ID band Patient awake    Reviewed: Allergy & Precautions, NPO status , Patient's Chart, lab work & pertinent test results  History of Anesthesia Complications Negative for: history of anesthetic complications  Airway Mallampati: III  TM Distance: >3 FB Neck ROM: Full    Dental   Pulmonary neg pulmonary ROS, former smoker,    Pulmonary exam normal        Cardiovascular hypertension, + CAD and + Cardiac Stents  + dysrhythmias Atrial Fibrillation  Rhythm:Irregular Rate:Normal     Neuro/Psych negative neurological ROS  negative psych ROS   GI/Hepatic Neg liver ROS, GERD  ,  Endo/Other  negative endocrine ROS  Renal/GU negative Renal ROS  negative genitourinary   Musculoskeletal negative musculoskeletal ROS (+)   Abdominal   Peds  Hematology negative hematology ROS (+)   Anesthesia Other Findings  Echo 02/26/19: Normal LVEF 50-55%. No significant valvular abnormality. Moderate to severe dilatation of aortic root/ascending aorta to 5.2 cm   Reproductive/Obstetrics                             Anesthesia Physical Anesthesia Plan  ASA: III  Anesthesia Plan: General   Post-op Pain Management:    Induction: Intravenous  PONV Risk Score and Plan: 2 and TIVA and Treatment may vary due to age or medical condition  Airway Management Planned: Mask  Additional Equipment: None  Intra-op Plan:   Post-operative Plan:   Informed Consent: I have reviewed the patients History and Physical, chart, labs and discussed the procedure including the risks, benefits and alternatives for the proposed anesthesia with the patient or authorized representative who has indicated his/her understanding and acceptance.       Plan Discussed with:   Anesthesia Plan Comments:         Anesthesia Quick Evaluation

## 2019-06-16 NOTE — Transfer of Care (Signed)
Immediate Anesthesia Transfer of Care Note  Patient: Ronald [name]  Procedure(s) Performed: CARDIOVERSION (N/A )  Patient Location: Endoscopy Unit  Anesthesia Type:General  Level of Consciousness: drowsy and responds to stimulation  Airway & Oxygen Therapy: Patient Spontanous Breathing  Post-op Assessment: Report given to RN and Post -op Vital signs reviewed and stable  Post vital signs: Reviewed and stable  Last Vitals:  Vitals Value Taken Time  BP 96/64 06/16/19 0804  Temp    Pulse 60 06/16/19 0806  Resp 14 06/16/19 0806  SpO2 100 % 06/16/19 0806    Last Pain:  Vitals:   06/16/19 0725  TempSrc: Oral  PainSc: 0-No pain         Complications: No apparent anesthesia complications

## 2019-06-16 NOTE — Anesthesia Postprocedure Evaluation (Signed)
Anesthesia Post Note  Patient: Ronald [name]  Procedure(s) Performed: CARDIOVERSION (N/A )     Patient location during evaluation: Endoscopy Anesthesia Type: MAC Level of consciousness: awake and alert Pain management: pain level controlled Vital Signs Assessment: post-procedure vital signs reviewed and stable Respiratory status: spontaneous breathing, nonlabored ventilation and respiratory function stable Cardiovascular status: blood pressure returned to baseline and stable Postop Assessment: no apparent nausea or vomiting Anesthetic complications: no    Last Vitals:  Vitals:   06/16/19 0810 06/16/19 0820  BP: 106/77 111/78  Pulse: 87 70  Resp: 15 16  Temp: 36.4 C   SpO2: 92% 95%    Last Pain:  Vitals:   06/16/19 0820  TempSrc:   PainSc: 0-No pain                 Lidia Collum

## 2019-06-16 NOTE — CV Procedure (Signed)
Electrical Cardioversion Procedure Note ALAA EYERMAN [id number] 07/10/1940  Procedure: Electrical Cardioversion Indications:  Atrial Fibrillation  Procedure Details Consent: Risks of procedure as well as the alternatives and risks of each were explained to the (patient/caregiver).  Consent for procedure obtained. Time Out: Verified patient identification, verified procedure, site/side was marked, verified correct patient position, special equipment/implants available, medications/allergies/relevent history reviewed, required imaging and test results available.  Performed  Patient placed on cardiac monitor, pulse oximetry, supplemental oxygen as necessary.  Sedation given: propofol Pacer pads placed anterior and posterior chest.  Cardioverted 1 time(s).  Cardioverted at 200J.  Evaluation Findings: Post procedure EKG shows: NSR Complications: None Patient did tolerate procedure well.   Chilton Si 06/16/2019, 8:05 AM

## 2019-06-16 NOTE — Anesthesia Procedure Notes (Signed)
Procedure Name: General with mask airway Date/Time: 06/16/2019 7:58 AM Performed by: Drema Pry, CRNA Pre-anesthesia Checklist: Patient identified, Emergency Drugs available, Suction available, Patient being monitored and Timeout performed Patient Re-evaluated:Patient Re-evaluated prior to induction Oxygen Delivery Method: Circle system utilized Preoxygenation: Pre-oxygenation with 100% oxygen Induction Type: IV induction

## 2019-06-16 NOTE — Interval H&P Note (Signed)
History and Physical Interval Note:  06/16/2019 8:01 AM  Ronald [name]  has presented today for surgery, with the diagnosis of AFIB.  The various methods of treatment have been discussed with the patient and family. After consideration of risks, benefits and other options for treatment, the patient has consented to  Procedure(s): CARDIOVERSION (N/A) as a surgical intervention.  The patient's history has been reviewed, patient examined, no change in status, stable for surgery.  I have reviewed the patient's chart and labs.  Questions were answered to the patient's satisfaction.     Chilton Si, MD

## 2019-06-17 ENCOUNTER — Encounter: Payer: Self-pay | Admitting: *Deleted

## 2019-06-22 ENCOUNTER — Other Ambulatory Visit: Payer: Self-pay

## 2019-06-22 ENCOUNTER — Encounter (HOSPITAL_COMMUNITY): Payer: Self-pay | Admitting: Physician Assistant

## 2019-06-22 ENCOUNTER — Ambulatory Visit (HOSPITAL_COMMUNITY)
Admission: RE | Admit: 2019-06-22 | Discharge: 2019-06-22 | Disposition: A | Discharge status: Home / Self Care | Payer: Medicare PPO | Source: Ambulatory Visit | Attending: Physician Assistant | Admitting: Physician Assistant

## 2019-06-22 VITALS — BP 118/60 | HR 76 | Ht 79.0 in | Wt 285.2 lb

## 2019-06-22 DIAGNOSIS — E669 Obesity, unspecified: Secondary | ICD-10-CM | POA: Insufficient documentation

## 2019-06-22 DIAGNOSIS — I2510 Atherosclerotic heart disease of native coronary artery without angina pectoris: Secondary | ICD-10-CM | POA: Insufficient documentation

## 2019-06-22 DIAGNOSIS — E7800 Pure hypercholesterolemia, unspecified: Secondary | ICD-10-CM | POA: Diagnosis not present

## 2019-06-22 DIAGNOSIS — I10 Essential (primary) hypertension: Secondary | ICD-10-CM | POA: Diagnosis not present

## 2019-06-22 DIAGNOSIS — Z6832 Body mass index (BMI) 32.0-32.9, adult: Secondary | ICD-10-CM | POA: Insufficient documentation

## 2019-06-22 DIAGNOSIS — D6869 Other thrombophilia: Secondary | ICD-10-CM

## 2019-06-22 DIAGNOSIS — Z7901 Long term (current) use of anticoagulants: Secondary | ICD-10-CM | POA: Insufficient documentation

## 2019-06-22 DIAGNOSIS — I4819 Other persistent atrial fibrillation: Secondary | ICD-10-CM | POA: Diagnosis present

## 2019-06-22 DIAGNOSIS — I4811 Longstanding persistent atrial fibrillation: Secondary | ICD-10-CM | POA: Diagnosis not present

## 2019-06-22 DIAGNOSIS — Z79899 Other long term (current) drug therapy: Secondary | ICD-10-CM | POA: Diagnosis not present

## 2019-06-22 DIAGNOSIS — Z87891 Personal history of nicotine dependence: Secondary | ICD-10-CM | POA: Insufficient documentation

## 2019-06-22 DIAGNOSIS — Z8249 Family history of ischemic heart disease and other diseases of the circulatory system: Secondary | ICD-10-CM | POA: Diagnosis not present

## 2019-06-22 NOTE — Progress Notes (Signed)
Primary Care Physician: [name], MD Primary Cardiologist: Dr Clifton Deja Primary Electrophysiologist: Dr Johney Frame Referring Physician: Dr Herbert Pun is a 79 y.o. male with a history of CAD s/p stent RCA 2004, HTN, HLD, thoracic aortic aneurysm(followed by Dr. Tyrone Sage), and persistent atrial fibrillation who presents for follow up in the [facility].  The patient was initially diagnosed with atrial fibrillation on 02/17/19 after presenting with symptoms of SOB and was found to be in rate controlled afib. He was started on Eliquis for a CHADS2VASC score of 4. He underwent DCCV on 03/13/19. He reports that he had some minimal improvement in his symptoms of dyspnea but it is still persistent. He denies significant snoring or alcohol use. Patient is s/p DCCV on 04/15/19. Patient reports that he was in SR for 4 days with no symptoms of SOB and greatly increased energy level. Unfortunately, he had return of his afib. Patient is s/p afib ablation with Dr Johney Frame on 05/12/19.   On follow up today, patient is s/p DCCV on 06/09/19. He reports that he has done well since his last visit. He has more energy and less SOB. He has been about to work this past weekend in [employer].   Today, he denies symptoms of palpitations, chest pain, orthopnea, PND, lower extremity edema, dizziness, presyncope, syncope, snoring, daytime somnolence, bleeding, or neurologic sequela. The patient is tolerating medications without difficulties and is otherwise without complaint today.    Atrial Fibrillation Risk Factors:  he does not have symptoms or diagnosis of sleep apnea. he does not have a history of rheumatic fever. he does not have a history of alcohol use. The patient does have a history of early familial atrial fibrillation or other arrhythmias. Mother had afib.  he has a BMI of Body mass index is 32.13 kg/m.Marland Kitchen Filed Weights   06/22/19 0901  Weight: 129.4 kg     Family History  Problem Relation Age of Onset  . Heart disease Father   . Heart attack Father        61  . Cancer Mother 53       LUNG  . Hypertension Neg Hx   . Stroke Neg Hx      Atrial Fibrillation Management history:  Previous antiarrhythmic drugs: amiodarone Previous cardioversions: 03/13/19, 04/15/19 Previous ablations: 05/12/19 CHADS2VASC score: 4 Anticoagulation history: Eliquis   Past Medical History:  Diagnosis Date  . Arthritis   . Coronary artery disease    Stent 2004  . GERD (gastroesophageal reflux disease)   . History of echocardiogram    Echo 5/17: mild LVH, EF 50-55%, no RWMA, Gr 2 DD, mild to mod AI, PASP 32 mmHg  . History of nuclear stress test    Myoview 5/17 - EF 48%, no scar, no ischemia. Low Risk  . History of pneumonia    79 years old  . Hx of Bell's palsy    left face  . Hypercholesteremia    managed by dr Waynard Edwards  . Hypertension   . 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   . Lung nodules    noted on past CT scans  . Malaria   . SVT (supraventricular tachycardia) (HCC)    remote episode during exercise test  . Thoracic aortic aneurysm [facility])    last scan in Jan 2013. Now measuring 5.0cm.  Referred to Dr. Tyrone Sage  . Wears dentures    bottom  . Wears glasses    Past Surgical History:  Procedure Laterality Date  . ATRIAL FIBRILLATION ABLATION N/A 05/12/2019   Procedure: ATRIAL FIBRILLATION ABLATION;  Surgeon: Hillis Range, MD;  [facility] INVAS[facility] CV LAB;  Service: Cardiovascular;  Laterality: N/A;  . CARDIOVERSION N/A 03/13/2019   Procedure: CARDIOVERSION;  Surgeon: Pricilla Riffle, MD;  Location: [facility] ENDOSCOPY;  Service: Cardiovascular;  Laterality: N/A;  . CARDIOVERSION N/A 04/15/2019   Procedure: CARDIOVERSION;  Surgeon: Vesta Mixer, MD;  Location: [facility] ENDOSCOPY;  Service: Cardiovascular;  Laterality: N/A;  . CARDIOVERSION N/A 06/16/2019    Procedure: CARDIOVERSION;  Surgeon: Chilton Si, MD;  Location: [facility] ENDOSCOPY;  Service: Cardiovascular;  Laterality: N/A;  . CERVICAL FUSION  03-2010   c2-c7  . COLONOSCOPY     x4  . CORONARY STENT PLACEMENT  2004  . dental implant     x 2  . ELBOW SURGERY  1997   lt and rt  . EYE SURGERY    . HIP PINNING,CANNULATED Right 05/11/2014   Procedure: CANNULATED HIP PINNING;  Surgeon: Sheral Apley, MD;  Location: [facility] OR;  Service: Orthopedics;  Laterality: Right;  . RETINAL DETACHMENT SURGERY  1975   lt  . rotator cuff surgery  1997   lt  . SHOULDER ARTHROSCOPY  2/15   right-got cardiac clearance-gsc  . TONSILLECTOMY    . TRIGGER FINGER RELEASE Left 10/12/2013   Procedure: LEFT LONG FINGER TRIGGER RELEASE;  Surgeon: Jodi Marble, MD;  Location: [facility];  Service: Orthopedics;  Laterality: Left;    Current Outpatient Medications  Medication Sig Dispense Refill  . amiodarone (PACERONE) 200 MG tablet Take 1 tablet (200 mg total) by mouth daily. 90 tablet 3  . apixaban (ELIQUIS) 5 MG TABS tablet Take 1 tablet (5 mg total) by mouth 2 (two) times daily. 60 tablet 5  . chlorthalidone (HYGROTON) 25 MG tablet Take 25 mg by mouth daily.     . clobetasol ointment (TEMOVATE) 0.05 % Apply 1 application topically 2 (two) times daily as needed (itchy scaly on legs).     . Coenzyme Q10 (COQ-10) 200 MG CAPS Take 200 mg by mouth daily.     . colesevelam (WELCHOL) 625 MG tablet Take 3,750 mg by mouth daily after supper. 6 tablets    . ezetimibe (ZETIA) 10 MG tablet Take 5 mg by mouth every evening.     . fluticasone (FLONASE) 50 MCG/ACT nasal spray Place 1 spray into both nostrils daily as needed for allergies or rhinitis.    . Glucosamine HCl 1500 MG TABS Take 3,000 mg by mouth daily.    . hydrocortisone 2.5 % cream Apply 1 application topically as needed (eye brows flakey). Use on head    . Ivermectin (SOOLANTRA EX) Apply 1 application topically daily as needed (Rosacea).      Marland Kitchen ketoconazole (NIZORAL) 2 % cream Apply 1 application topically daily as needed for irritation (Corner of mouth).     . lisinopril (PRINIVIL,ZESTRIL) 40 MG tablet Take 40 mg by mouth every evening.     Marland Kitchen LIVALO 4 MG TABS Take 2 mg by mouth at bedtime.     . Multiple Vitamin (MULTIVITAMIN WITH MINERALS) TABS tablet Take 1 tablet by mouth daily.     . mupirocin ointment (BACTROBAN) 2 % Place 1 application into the nose daily as needed (Tic bits).    . nitroGLYCERIN (NITROSTAT) 0.4 MG SL  tablet Place 1 tablet (0.4 mg total) under the tongue every 5 (five) minutes as needed for chest pain. 25 tablet 1  . omeprazole (PRILOSEC) 20 MG capsule Take 20 mg by mouth daily.      No current facility-administered medications for this encounter.    Allergies  Allergen Reactions  . Quinolones Hypertension    Patient was warned about not using Cipro and similar antibiotics. Recent studies have raised concern that fluoroquinolone antibiotics could be associated with an increased risk of aortic aneurysm Fluoroquinolones have non-antimicrobial properties that might jeopardise the integrity of the extracellular matrix of the vascular wall In a  propensity score matched cohort study in [place], there was a 66% increased rate of aortic aneurysm or dissection associated with oral fluoroquinolone use, compared wit  . Promethazine Hcl Other (See Comments)    Agitation and incoherence (phenergan)  . Vesicare [Solifenacin Succinate] Itching and Rash    Social History   Socioeconomic History  . Marital status: Married    Spouse name: Not on file  . Number of children: 1  . Years of education: Not on file  . Highest education level: Not on file  Occupational History  . Occupation: retired Advertising account executive: RETIRED  Tobacco Use  . Smoking status: Former Smoker    Quit date: 04/09/1969    Years since quitting: 50.2  . Smokeless tobacco: Current User    Types: Chew  . Tobacco comment: reports that  when he smoked it was intermittent  Substance and Sexual Activity  . Alcohol use: Yes    Alcohol/week: 1.0 standard drinks    Types: 1 Standard drinks or equivalent per week    Comment: occasional mainly during holiday  . Drug use: No  . Sexual activity: Yes  Other Topics Concern  . 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 Difficulty of Paying Living Expenses:   Food Insecurity:   . Worried About Programme researcher, broadcasting/film/video in the Last Year:   . Barista in the Last Year:   Transportation Needs:   . Freight forwarder (Medical):   Marland Kitchen Lack of Transportation (Non-Medical):   Physical Activity:   . Days of Exercise per Week:   . Minutes of Exercise per Session:   Stress:   . Feeling of Stress :   Social Connections:   . Frequency of Communication with Friends and Family:   . Frequency of Social Gatherings with Friends and Family:   . Attends Religious Services:   . Active Member of Clubs or Organizations:   . Attends Banker Meetings:   Marland Kitchen Marital Status:   Intimate Partner Violence:   . Fear of Current or Ex-Partner:   . Emotionally Abused:   Marland Kitchen Physically Abused:   . Sexually Abused:      ROS- All systems are reviewed and negative except as per the HPI above.  Physical Exam: Vitals:   06/22/19 0901  BP: 118/60  Pulse: 76  Weight: 129.4 kg  Height: 6\' 7"  (2.007 m)    GEN- The patient is well appearing obese elderly male, alert and oriented x 3 today.   HEENT-head normocephalic, atraumatic, sclera clear, conjunctiva pink, hearing intact, trachea midline. Lungs- Clear to ausculation bilaterally, normal work of breathing Heart- Regular rate  and rhythm, no murmurs, rubs or gallops  GI- soft, NT, ND, + BS Extremities- no clubbing, cyanosis, or edema MS- no significant deformity  or atrophy Skin- no rash or lesion Psych- euthymic mood, full affect Neuro- strength and sensation are intact   Wt Readings from Last 3 Encounters:  06/22/19 129.4 kg  06/16/19 124.3 kg  06/09/19 128.3 kg    EKG today demonstrates SR HR 76, RBBB, LAFB, PR 208, QRS 122, QTc 452  Echo 02/26/19 demonstrated   1. Left ventricular ejection fraction, by visual estimation, is 50 to 55%. The left ventricle has normal function. There is moderately increased left ventricular hypertrophy.  2. Definity contrast agent was given IV to delineate the left ventricular endocardial borders.  3. Left ventricular diastolic function could not be evaluated.  4. Global right ventricle has normal systolic function.The right ventricular size is normal.  5. Left atrial size was normal.  6. Right atrial size was normal.  7. The mitral valve is normal in structure. Trace mitral valve regurgitation. No evidence of mitral stenosis.  8. The tricuspid valve is normal in structure. Tricuspid valve regurgitation is trivial.  9. The aortic valve is tricuspid. Aortic valve regurgitation is mild. Mild aortic valve sclerosis without stenosis. 10. The pulmonic valve was not well visualized. Pulmonic valve regurgitation is not visualized. 11. Aortic dilatation noted. 12. 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 13. The inferior vena cava is normal in size with greater than 50% respiratory variability, suggesting right atrial pressure of 3 mmHg. 14. Definity used; normal LV systolic function; moderate LVH; moderate to severe dilatation of aortic root/ascending aorta (5.2 cm; suggest CTA or MRA to further assess); mild AI.  LA 4.8 cm  Epic records are reviewed at length today  Assessment and Plan:  1. Persistent atrial fibrillation S/p DCCV on 03/13/19 with ERAF. Loaded on amiodarone and underwent repeat DCCV on 04/15/19. S/p afib ablation 05/12/19 with Dr Rayann Heman.  S/p DCCV on 06/16/19. Continue amiodarone 200 mg daily for now. Continue Eliquis 5 mg BID He has been intolerant of BB in the past 2/2 bradycardia.  This patients CHA2DS2-VASc Score and unadjusted Ischemic Stroke Rate (% per year) is equal to 4.8 % stroke rate/year from a score of 4  Above score calculated as 1 point each if present [CHF, HTN, DM, Vascular=MI/PAD/Aortic Plaque, Age if 65-74, or Male] Above score calculated as 2 points each if present [Age > 75, or Stroke/TIA/TE]   2. Obesity Body mass index is 32.13 kg/m. Lifestyle modification was discussed and encouraged including regular physical activity and weight reduction.  3. HTN Stable, no changes today.  4. CAD S/p RCA stenting 2004. No anginal symptoms.  Followed by Dr Angelena Form   Follow up with Dr Rayann Heman as scheduled.    [facility] [address] [place], [place] [address] [phone] 06/22/2019 10:39 AM

## 2019-07-01 ENCOUNTER — Telehealth: Payer: Self-pay | Admitting: *Deleted

## 2019-07-01 NOTE — Telephone Encounter (Signed)
Entered in error

## 2019-07-20 ENCOUNTER — Other Ambulatory Visit: Payer: Self-pay | Admitting: *Deleted

## 2019-07-20 DIAGNOSIS — I712 Thoracic aortic aneurysm, without rupture, unspecified: Secondary | ICD-10-CM

## 2019-08-10 ENCOUNTER — Encounter: Payer: Self-pay | Admitting: Internal Medicine

## 2019-08-10 ENCOUNTER — Telehealth: Payer: Self-pay

## 2019-08-10 ENCOUNTER — Other Ambulatory Visit: Payer: Self-pay

## 2019-08-10 ENCOUNTER — Ambulatory Visit (INDEPENDENT_AMBULATORY_CARE_PROVIDER_SITE_OTHER): Payer: Medicare PPO | Admitting: Internal Medicine

## 2019-08-10 VITALS — BP 115/65 | HR 65 | Ht 79.0 in | Wt 278.0 lb

## 2019-08-10 DIAGNOSIS — I10 Essential (primary) hypertension: Secondary | ICD-10-CM | POA: Diagnosis not present

## 2019-08-10 DIAGNOSIS — I2510 Atherosclerotic heart disease of native coronary artery without angina pectoris: Secondary | ICD-10-CM

## 2019-08-10 DIAGNOSIS — I4819 Other persistent atrial fibrillation: Secondary | ICD-10-CM | POA: Diagnosis not present

## 2019-08-10 NOTE — Telephone Encounter (Signed)
-----   Message from Hillis Range, MD sent at 08/10/2019 12:08 PM EDT ----- Stop amiodarone (pt aware)  Follow-up with me in 3 months in the office

## 2019-08-10 NOTE — Progress Notes (Signed)
Electrophysiology TeleHealth Note  Due to national recommendations of social distancing due to COVID 19, an audio telehealth visit is felt to be most appropriate for this patient at this time.  Verbal consent was obtained by me for the telehealth visit today.  The patient does not have capability for a virtual visit.  A phone visit is therefore required today.   Date:  08/10/2019   ID:  Ronald [name], DOB May 03, 1940, MRN [id number]  Location: patient's home  Provider location:  [place]  Evaluation Performed: Follow-up visit  PCP:  [name], MD   Electrophysiologist:  Dr Johney Frame  Chief Complaint:  palpitations  History of Present Illness:    Ronald [name] is a 79 y.o. male who presents via telehealth conferencing today.  Since last being seen in our clinic, the patient reports doing very well.  He feels well.  He is unaware of any afib.  He did have ERAF requiring cardioversion in early march.  He denies procedure related complications.  He is active doing yard work, without SOB.  He has diffuse join pains.  He does not have a lot of energy.  Today, he denies symptoms of palpitations, chest pain,  lower extremity edema, dizziness, presyncope, or syncope.  The patient is otherwise without complaint today.     Past Medical History:  Diagnosis Date  . Arthritis   . Coronary artery disease    Stent 2004  . GERD (gastroesophageal reflux disease)   . History of echocardiogram    Echo 5/17: mild LVH, EF 50-55%, no RWMA, Gr 2 DD, mild to mod AI, PASP 32 mmHg  . History of nuclear stress test    Myoview 5/17 - EF 48%, no scar, no ischemia. Low Risk  . History of pneumonia    79 years old  . Hx of Bell's palsy    left face  . Hypercholesteremia    managed by dr Waynard Edwards  . Hypertension   . 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   . Lung nodules    noted on past CT scans  .  Malaria   . SVT (supraventricular tachycardia) (HCC)    remote episode during exercise test  . Thoracic aortic aneurysm [facility])    last scan in Jan 2013. Now measuring 5.0cm. Referred to Dr. Tyrone Sage  . Wears dentures    bottom  . Wears glasses     Past Surgical History:  Procedure Laterality Date  . ATRIAL FIBRILLATION ABLATION N/A 05/12/2019   Procedure: ATRIAL FIBRILLATION ABLATION;  Surgeon: Hillis Range, MD;  Location: [facility] INVASIVE CV LAB;  Service: Cardiovascular;  Laterality: N/A;  . CARDIOVERSION N/A 03/13/2019   Procedure: CARDIOVERSION;  Surgeon: Pricilla Riffle, MD;  Location: [facility] ENDOSCOPY;  Service: Cardiovascular;  Laterality: N/A;  . CARDIOVERSION N/A 04/15/2019   Procedure: CARDIOVERSION;  Surgeon: Vesta Mixer, MD;  Location: [facility] ENDOSCOPY;  Service: Cardiovascular;  Laterality: N/A;  . CARDIOVERSION N/A 06/16/2019   Procedure: CARDIOVERSION;  Surgeon: Chilton Si, MD;  Location: [facility] ENDOSCOPY;  Service: Cardiovascular;  Laterality: N/A;  . CERVICAL FUSION  03-2010   c2-c7  . COLONOSCOPY     x4  . CORONARY STENT PLACEMENT  2004  . dental implant     x 2  . ELBOW SURGERY  1997   lt and rt  . EYE SURGERY    .  HIP PINNING,CANNULATED Right 05/11/2014   Procedure: CANNULATED HIP PINNING;  Surgeon: Sheral Apley, MD;  Location: [facility] OR;  Service: Orthopedics;  Laterality: Right;  . RETINAL DETACHMENT SURGERY  1975   lt  . rotator cuff surgery  1997   lt  . SHOULDER ARTHROSCOPY  2/15   right-got cardiac clearance-gsc  . TONSILLECTOMY    . TRIGGER FINGER RELEASE Left 10/12/2013   Procedure: LEFT LONG FINGER TRIGGER RELEASE;  Surgeon: Jodi Marble, MD;  Location: [facility];  Service: Orthopedics;  Laterality: Left;    Current Outpatient Medications  Medication Sig Dispense Refill  . amiodarone (PACERONE) 200 MG tablet Take 1 tablet (200 mg total) by mouth daily. 90 tablet 3  . apixaban (ELIQUIS) 5 MG TABS tablet Take 1 tablet (5 mg total) by mouth 2  (two) times daily. 60 tablet 5  . chlorthalidone (HYGROTON) 25 MG tablet Take 25 mg by mouth daily.     . clobetasol ointment (TEMOVATE) 0.05 % Apply 1 application topically 2 (two) times daily as needed (itchy scaly on legs).     . Coenzyme Q10 (COQ-10) 200 MG CAPS Take 200 mg by mouth daily.     . colesevelam (WELCHOL) 625 MG tablet Take 3,750 mg by mouth daily after supper. 6 tablets    . ezetimibe (ZETIA) 10 MG tablet Take 5 mg by mouth every evening.     . fluticasone (FLONASE) 50 MCG/ACT nasal spray Place 1 spray into both nostrils daily as needed for allergies or rhinitis.    . Glucosamine HCl 1500 MG TABS Take 3,000 mg by mouth daily.    . hydrocortisone 2.5 % cream Apply 1 application topically as needed (eye brows flakey). Use on head    . Ivermectin (SOOLANTRA EX) Apply 1 application topically daily as needed (Rosacea).     Marland Kitchen ketoconazole (NIZORAL) 2 % cream Apply 1 application topically daily as needed for irritation (Corner of mouth).     . lisinopril (PRINIVIL,ZESTRIL) 40 MG tablet Take 40 mg by mouth every evening.     Marland Kitchen LIVALO 4 MG TABS Take 2 mg by mouth at bedtime.     . Multiple Vitamin (MULTIVITAMIN WITH MINERALS) TABS tablet Take 1 tablet by mouth daily.     . mupirocin ointment (BACTROBAN) 2 % Place 1 application into the nose daily as needed (Tic bits).    . nitroGLYCERIN (NITROSTAT) 0.4 MG SL tablet Place 1 tablet (0.4 mg total) under the tongue every 5 (five) minutes as needed for chest pain. 25 tablet 1  . omeprazole (PRILOSEC) 20 MG capsule Take 20 mg by mouth daily.      No current facility-administered medications for this visit.    Allergies:   Quinolones, Promethazine hcl, and Vesicare [solifenacin succinate]   Social History:  The patient  reports that he quit smoking about 50 years ago. His smokeless tobacco use includes chew. He reports current alcohol use of about 1.0 standard drinks of alcohol per week. He reports that he does not use drugs.   ROS:  Please  see the history of present illness.   All other systems are personally reviewed and negative.    Exam:    Vital Signs:  BP 115/65   Pulse 65   Ht 6\' 7"  (2.007 m)   Wt 278 lb (126.1 kg)   BMI 31.32 kg/m   Well sounding, alert and conversant   Labs/Other Tests and Data Reviewed:    Recent Labs: 06/09/2019: ALT 26;  BUN 30; Creatinine, Ser 1.32; Hemoglobin 15.2; Platelets 205; Potassium 4.5; Sodium 141; TSH 4.472   Wt Readings from Last 3 Encounters:  08/10/19 278 lb (126.1 kg)  06/22/19 285 lb 3.2 oz (129.4 kg)  06/16/19 274 lb (124.3 kg)       ASSESSMENT & PLAN:    1.  Longstanding persistent afib Doing well post ablation He did have ERAF but appears to be maintaining sinus rhythm Stop amiodarone Continue eliquis for chads2vasc score of 4.  2. CAD No ischemic symptoms No changes  3. Hypertensive cardiovascular disease Stable No change required today  4. Morbid obesity Weight loss and activity is advised  Follow-up: 3 months with me in office   Patient Risk:  after full review of this patients clinical status, I feel that they are at moderate risk at this time.  Today, I have spent 15 minutes with the patient with telehealth technology discussing arrhythmia management .    Army Fossa, MD  08/10/2019 12:01 PM     [place] [place] [address] [phone] (office) [phone] (fax)

## 2019-08-10 NOTE — Telephone Encounter (Signed)
Pt to stop amiodarone.  F/u in office in 3 months.

## 2019-09-02 NOTE — Progress Notes (Signed)
[address]       [place] [address]             [phone]         [address]  [place] [address]             [phone]                                                      Jacori S Linck Garrison Medical Record #[id number] Date of Birth: 1940-11-23  Referring: Burnell Blanks* Primary Care: Crist Infante, MD  Chief Complaint:    Dilated aorta    History of Present Illness:    Patient is a 79 year-old male with known coronary occlusive disease having a stents placed in his right coronary artery 12  years ago. He is also known to have a dilated ascending aorta this is been followed since 2004 with serial CT scans. Scans dating back to 2004 showed aorta at 4.5 cm in 2004.  Patient has no symptoms of angina or congestive heart failure    There is no family history of aortic aneurysm or aortic dissections. His father died at age 70 in 22 sudden death while at a picnic attributed to "cardiac spasm". The patient does chew tobacco, previously smoked cigarettes more than 25 years ago for 1 year.   His primary complaint remains chronic back pain and knee pain making it difficult to be active.   Since last seen the patient had a limited cardiac CT done in January prior to A. fib ablation, he is required several cardioversions but now is in sinus rhythm   Current Activity/ Functional Status: Patient is independent with mobility/ambulation, transfers, ADL's, IADL's.   Past Medical History  Diagnosis Date  . Hypertension   . Hypercholesteremia     managed by dr Joylene Draft  . 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   . Thoracic aortic aneurysm     last scan in Jan 2013. Now measuring 5.0cm. Referred to Dr. Servando Snare  . Malaria  patient had 3 episodes of malaria while serving in [place]   . SVT (supraventricular tachycardia)      remote episode during exercise test  . Lung nodules     noted on past CT scans    Past Surgical History:  Procedure Laterality Date  . ATRIAL FIBRILLATION ABLATION N/A 05/12/2019   Procedure: ATRIAL FIBRILLATION ABLATION;  Surgeon: Thompson Grayer, MD;  Location: [facility];  Service: Cardiovascular;  Laterality: N/A;  . CARDIOVERSION N/A 03/13/2019   Procedure: CARDIOVERSION;  Surgeon: Fay Records, MD;  Location: [facility];  Service: Cardiovascular;  Laterality: N/A;  . CARDIOVERSION N/A 04/15/2019   Procedure: CARDIOVERSION;  Surgeon: Thayer Headings, MD;  Location: [facility] ENDOSCOPY;  Service: Cardiovascular;  Laterality: N/A;  . CARDIOVERSION N/A 06/16/2019   Procedure: CARDIOVERSION;  Surgeon: Skeet Latch, MD;  Location: [facility];  Service: Cardiovascular;  Laterality: N/A;  . CERVICAL FUSION  03-2010   c2-c7  . COLONOSCOPY     x4  . CORONARY STENT PLACEMENT  2004  . dental implant     x 2  .  ELBOW SURGERY  1997   lt and rt  . EYE SURGERY    . HIP PINNING,CANNULATED Right 05/11/2014   Procedure: CANNULATED HIP PINNING;  Surgeon: Sheral Apley, MD;  Location: [facility] OR;  Service: Orthopedics;  Laterality: Right;  . RETINAL DETACHMENT SURGERY  1975   lt  . rotator cuff surgery  1997   lt  . SHOULDER ARTHROSCOPY  2/15   right-got cardiac clearance-gsc  . TONSILLECTOMY    . TRIGGER FINGER RELEASE Left 10/12/2013   Procedure: LEFT LONG FINGER TRIGGER RELEASE;  Surgeon: Jodi Marble, MD;  Location: [facility];  Service: Orthopedics;  Laterality: Left;    Family History  Problem Relation Age of Onset  . Heart disease Father   . Heart attack Father        29  . Cancer Mother 1       LUNG  . Hypertension Neg Hx   . Stroke Neg Hx     History   Social History  . Marital Status: Married    Spouse Name: N/A    Number of Children: 1  . Years of Education: N/A   Occupational History  . 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 Smoking status: Former Smoker    Quit date: 04/09/1969  . Smokeless tobacco: Former Neurosurgeon    Types: Chew    Quit date: 03/09/2010  . Alcohol Use: 0.5 oz/week    1 drink(s) per week     occasional  . Drug Use: No  . Sexually Active: Yes    Social History Narrative       Social History   Tobacco Use  Smoking Status Former Smoker  . Quit date: 04/09/1969  . Years since quitting: 50.4  Smokeless Tobacco Current User  . Types: Chew  Tobacco Comment   reports that when he smoked it was intermittent    Social History   Substance and Sexual Activity  Alcohol Use Yes  . Alcohol/week: 1.0 standard drinks  . Types: 1 Standard drinks or equivalent per week   Comment: occasional mainly during holiday     Allergies  Allergen Reactions  . Quinolones Hypertension    Patient was warned about not using Cipro and similar antibiotics. Recent studies have raised concern that fluoroquinolone antibiotics could be associated with an increased risk of aortic aneurysm Fluoroquinolones have non-antimicrobial properties that might jeopardise the integrity of the extracellular matrix of the vascular wall In a  propensity score matched cohort study in [place], there was a 66% increased rate of aortic aneurysm or dissection associated with oral fluoroquinolone use, compared wit  . Promethazine Hcl Other (See Comments)    Agitation and incoherence (phenergan)  . Vesicare [Solifenacin Succinate] Itching and Rash    Current Outpatient Medications  Medication Sig Dispense Refill  . apixaban (ELIQUIS) 5 MG TABS tablet Take 1 tablet (5 mg total) by mouth 2 (two) times daily. 60 tablet 5  . chlorthalidone (HYGROTON) 25 MG tablet Take 25 mg by mouth daily.     . clobetasol ointment (TEMOVATE) 0.05 % Apply 1 application topically 2 (two) times daily as needed (itchy scaly on legs).     . Coenzyme Q10 (COQ-10) 200 MG CAPS Take 200 mg by mouth daily.      . colesevelam (WELCHOL) 625 MG tablet Take 3,750 mg by mouth daily after supper. 6 tablets    . ezetimibe (ZETIA)  10 MG tablet Take 5 mg by mouth every evening.     . fluticasone (FLONASE) 50 MCG/ACT nasal spray Place 1 spray into both nostrils daily as needed for allergies or rhinitis.    . Glucosamine HCl 1500 MG TABS Take 3,000 mg by mouth daily.    . hydrocortisone 2.5 % cream Apply 1 application topically as needed (eye brows flakey). Use on head    . Ivermectin (SOOLANTRA EX) Apply 1 application topically daily as needed (Rosacea).     Marland Kitchen ketoconazole (NIZORAL) 2 % cream Apply 1 application topically daily as needed for irritation (Corner of mouth).     . lisinopril (PRINIVIL,ZESTRIL) 40 MG tablet Take 40 mg by mouth every evening.     Marland Kitchen LIVALO 4 MG TABS Take 2 mg by mouth at bedtime.     . Multiple Vitamin (MULTIVITAMIN WITH MINERALS) TABS tablet Take 1 tablet by mouth daily.     . mupirocin ointment (BACTROBAN) 2 % Place 1 application into the nose daily as needed (Tic bits).    . nitroGLYCERIN (NITROSTAT) 0.4 MG SL tablet Place 1 tablet (0.4 mg total) under the tongue every 5 (five) minutes as needed for chest pain. 25 tablet 1  . omeprazole (PRILOSEC) 20 MG capsule Take 20 mg by mouth daily.      No current facility-administered medications for this visit.       Review of Systems:     Cardiac Review of Systems: Y or N  Chest Pain [  n ]  Resting SOB [n ] Exertional SOB  [n]  Orthopnea [ n ]   Pedal Edema n[]     Palpitations [ N ] Syncope  [n    Presyncope [ n ]  General Review of Systems: [Y] = yes [  ]=no Constitional: recent weight change [  ]; anorexia [  ]; fatigue [  ]; nausea [  ]; night sweats [  ]; fever [  ]; or chills [  ];                                                                                                                                          Dental: poor dentition[ y caps]; Last Dentist visit: last 6 months  Eye : blurred vision [  ]; diplopia [   ];  vision changes [  ];  Amaurosis fugax[  ]; Resp: cough [  ];  wheezing[  ];  hemoptysis[  ]; shortness of breath[  ]; paroxysmal nocturnal dyspnea[  ]; dyspnea on exertion[  ]; or orthopnea[  ];  GI:  gallstones[  ], vomiting[  ];  dysphagia[  ]; melena[  ];  hematochezia [  ]; heartburn[  ];   Hx of  Colonoscopy[] ; GU: kidney stones [  ]; hematuria[  ];   dysuria [ y ];  nocturia[  ];  history of  obstruction [  ];             Skin: rash, swelling[  ];, hair loss[  ];  peripheral edema[  ];  or itching[  ]; Musculosketetal: myalgias[  ];  joint swelling[  ];  joint erythema[  ];  joint pain[  ];  back pain[  ];  Heme/Lymph: bruising[  ];  bleeding[  ];  anemia[  ];  Neuro: TIA[  ];  headaches[  ];  stroke[  ];  vertigo[  ];  seizures[  ];   paresthesias[  ];  difficulty walking[ yes, chronic back pain and bilaterial knee pain ];  Psych:depression[  ]; anxiety[  ];  Endocrine: diabetes[ N ];  thyroid dysfunction[N ];  Immunizations: Flu [ Y ]; Pneumococcal[Y ]; and COVID  Other:  Physical Exam: BP 115/80 (BP Location: Left Arm, Patient Position: Sitting)   Pulse 70   Temp 98.8 F (37.1 C)   Ht  (2.007 m)   Wt 280 lb (127 kg)   SpO2 95% Comment: RA  BMI 31.54 kg/m  General appearance: alert, appears stated age and no distress Neck: no adenopathy, no carotid bruit, no JVD, supple, symmetrical, trachea midline and thyroid not enlarged, symmetric, no tenderness/mass/nodules Lymph nodes: Cervical, supraclavicular, and axillary nodes normal. Resp: clear to auscultation bilaterally Cardio: regular rate and rhythm, S1, S2 normal, no murmur, click, rub or gallop GI: soft, non-tender; bowel sounds normal; no masses,  no organomegaly Extremities: extremities normal, atraumatic, no cyanosis or edema Neurologic: Grossly normal Patient has large body stature with a body surface area of 2.6 however he has no features of Marfan's,  Diagnostic Studies & Laboratory data:     Recent  Radiology Findings:  CT ANGIO CHEST AORTA W/CM & OR WO/CM  Result Date: 09/03/2019 CLINICAL DATA:  Follow-up TAA EXAM: CT ANGIOGRAPHY CHEST WITH CONTRAST TECHNIQUE: Multidetector CT imaging of the chest was performed using the standard protocol during bolus administration of intravenous contrast. Multiplanar CT image reconstructions and MIPs were obtained to evaluate the vascular anatomy. CONTRAST:  65mL ISOVUE-370 IOPAMIDOL (ISOVUE-370) INJECTION 76% COMPARISON:  05/06/2019, 08/21/2018, 07/04/2017, 06/07/2016, 05/26/2015, 12/03/2013, 05/12/2010 FINDINGS: Cardiovascular: Preferential opacification of the thoracic aorta. Redemonstrated enlargement of the tubular ascending thoracic aorta, measuring up to 4.8 x 4.7 cm, not significant changed compared to immediate prior examination although very gradually increased over time on examinations dating back to 05/12/2010, at which time it measured 4.3 x 4.1 cm. The aortic valve measures 2.6 cm. The sinuses of Valsalva measure up to 4.8 cm. The most proximal descending thoracic aorta measures up to 4.0 x 3.8 cm, and tapers to normal caliber throughout the remainder of the vessel. Normal heart size. Scattered coronary artery calcifications. No pericardial effusion. Mediastinum/Nodes: No enlarged mediastinal, hilar, or axillary lymph nodes. Thyroid gland, trachea, and esophagus demonstrate no significant findings. Lungs/Pleura: Innumerable small pulmonary nodules throughout the lungs, most numerous in the bilateral apices. More discrete, small, stable and definitively benign nodules scattered throughout the bilateral lower lobes. No pleural effusion or pneumothorax. Upper Abdomen: No acute abnormality. Musculoskeletal: No chest wall abnormality. No acute or significant osseous findings. Review of the MIP images confirms the above findings. IMPRESSION: 1. Redemonstrated enlargement of the tubular ascending thoracic aorta, measuring up to 4.8 x 4.7 cm, not significantly  changed compared to immediate prior examination although very gradually increased over time on examinations dating back to 05/12/2010, at which time it measured 4.3 x 4.1 cm. The most proximal descending thoracic aorta measures  up to 4.0 x 3.8 cm, and tapers to normal caliber throughout the remainder of the vessel, again unchanged compared to immediate prior examination although very gradually enlarged over time. No significant atherosclerosis. Ascending thoracic aortic aneurysm. Recommend semi-annual imaging followup by CTA or MRA and referral to cardiothoracic surgery if not already obtained. This recommendation follows 2010 ACCF/AHA/AATS/ACR/ASA/SCA/SCAI/SIR/STS/SVM Guidelines for the Diagnosis and Management of Patients With Thoracic Aortic Disease. Circulation. 2010; 121: Z610-R604. Aortic aneurysm NOS (ICD10-I71.9) 2. Innumerable small pulmonary nodules, not significant changed compared to prior examinations and most commonly seen in smoking-related respiratory bronchiolitis. 3. Coronary artery disease. Electronically Signed   By: Lauralyn Primes M.D.   On: 09/03/2019 15:03   Ct Angio Chest Aorta W/cm &/or Wo/cm  Result Date: 08/21/2018 CLINICAL DATA:  Follow-up thoracic aortic aneurysm EXAM: CT ANGIOGRAPHY CHEST WITH CONTRAST TECHNIQUE: Multidetector CT imaging of the chest was performed using the standard protocol during bolus administration of intravenous contrast. Multiplanar CT image reconstructions and MIPs were obtained to evaluate the vascular anatomy. CONTRAST:  75mL ISOVUE-370 COMPARISON:  07/04/2017 FINDINGS: Cardiovascular: Ascending aortic aneurysmal dilatation is again identified measuring up to 4.8 cm. The overall appearance is stable. No dissection is identified. Normal tapering distally is seen. The heart is not significantly enlarged in size. Coronary calcifications are again noted and stable. The pulmonary artery as visualized shows no large central embolus. Mediastinum/Nodes: Thoracic  inlet is within normal limits. No hilar or mediastinal adenopathy is noted. The esophagus as visualized is within normal limits. Lungs/Pleura: Lungs are well aerated bilaterally. Multiple small subpleural nodules are identified stable from the previous exam as well as more previous exams. The previously seen lingular nodule is no longer identified and was likely related to inflammatory change. No new focal abnormality is seen. Upper Abdomen: Visualized upper abdomen is within normal limits. Musculoskeletal: Degenerative changes of the thoracic spine are seen. No compression deformities are noted. No acute bony abnormality is noted. Postsurgical changes in the cervical spine are noted. Review of the MIP images confirms the above findings. IMPRESSION: Stable dilatation of the ascending aorta 4.8 cm. Ascending thoracic aortic aneurysm. Recommend semi-annual imaging followup by CTA or MRA and referral to cardiothoracic surgery if not already obtained. This recommendation follows 2010 ACCF/AHA/AATS/ACR/ASA/SCA/SCAI/SIR/STS/SVM Guidelines for the Diagnosis and Management of Patients With Thoracic Aortic Disease. Circulation. 2010; 121: V409-W119. Aortic aneurysm NOS (ICD10-I71.9) Stable pulmonary nodules bilaterally. The previously seen lingular nodule has resolved and was likely postinflammatory in nature. No new focal abnormality is seen. Aortic aneurysm NOS (ICD10-I71.9). Electronically Signed   By: Alcide Clever M.D.   On: 08/21/2018 14:06    Ct Angio Chest Aorta W/cm &/or Wo/cm  Result Date: 07/04/2017 CLINICAL DATA:  Thoracic aortic aneurysm follow-up. EXAM: CT ANGIOGRAPHY CHEST WITH CONTRAST TECHNIQUE: Multidetector CT imaging of the chest was performed using the standard protocol during bolus administration of intravenous contrast. Multiplanar CT image reconstructions and MIPs were obtained to evaluate the vascular anatomy. CONTRAST:  75mL ISOVUE-370 IOPAMIDOL (ISOVUE-370) INJECTION 76% Creatinine was obtained  on site at [facility] at [address] Results: Creatinine 1.0 mg/dL. COMPARISON:  CT chest dated June 07, 2016. FINDINGS: Cardiovascular: The heart size is normal. No pericardial effusion. Stable dilatation of the ascending thoracic aorta, measuring up to 4.8 cm. There is also aneurysmal dilatation of the proximal descending thoracic aorta, measuring up to 4.1 cm, also unchanged. No aortic dissection. No pulmonary embolism. Coronary artery atherosclerosis. Mild reflux of contrast into the intrahepatic veins. Mediastinum/Nodes: No enlarged mediastinal, hilar,  or axillary lymph nodes. Thyroid gland, trachea, and esophagus demonstrate no significant findings. Lungs/Pleura: New 5 mm pulmonary nodule in the lingula (series 7, image 111). Other scattered tiny pulmonary nodules in both lungs are unchanged since 2011 and benign. No focal consolidation, pleural effusion, or pneumothorax. Upper Abdomen: No acute abnormality. Musculoskeletal: No chest wall abnormality. No acute or significant osseous findings. Review of the MIP images confirms the above findings. IMPRESSION: 1. Stable ascending thoracic aortic aneurysm, measuring 4.8 cm. Recommend semi-annual imaging followup by CTA or MRA and referral to cardiothoracic surgery if not already obtained. This recommendation follows 2010 ACCF/AHA/AATS/ACR/ASA/SCA/SCAI/SIR/STS/SVM Guidelines for the Diagnosis and Management of Patients With Thoracic Aortic Disease. Circulation. 2010; 121: Z610-R604 2. Unchanged aneurysmal dilatation of the proximal descending thoracic aorta, measuring 4.1 cm. 3. New 5 mm pulmonary nodule in the lingula. No follow-up needed if patient is low-risk. Non-contrast chest CT can be considered in 12 months if patient is high-risk. This recommendation follows the consensus statement: Guidelines for Management of Incidental Pulmonary Nodules Detected on CT Images: From the Fleischner Society 2017; Radiology 2017; 284:228-243. 4. Reflux of  contrast into the intrahepatic veins may reflect a degree of right heart dysfunction. Electronically Signed   By: Obie Dredge M.D.   On: 07/04/2017 10:08   Ct Chest W Contrast  Result Date: 06/07/2016 CLINICAL DATA:  Lung nodules.  Known thoracic aneurysm. EXAM: CT CHEST WITH CONTRAST TECHNIQUE: Multidetector CT imaging of the chest was performed during intravenous contrast administration. CONTRAST:  75mL ISOVUE-300 IOPAMIDOL (ISOVUE-300) INJECTION 61% COMPARISON:  05/26/2015.  12/02/2014. FINDINGS: Cardiovascular: Heart size upper normal. Coronary artery calcification is noted. Atherosclerotic calcification is noted in the wall of the thoracic aorta. Ascending thoracic aorta measures 4.7 cm diameter. Mediastinum/Nodes: No mediastinal lymphadenopathy. No evidence for gross hilar lymphadenopathy although assessment is limited by the lack of intravenous contrast on today's study. The esophagus has normal imaging features. There is no axillary lymphadenopathy. Lungs/Pleura: Multiple tiny bilateral pulmonary nodules are again identified. Imaging today is degraded by breathing motion which obscures fine architectural detail in the lungs. Within this limitation, no change in the numerous bilateral scattered pulmonary nodules is apparent. Previous study compare these nodules back to an exam from 05/09/2009 and demonstrates stability, consistent with benign process. No new or progressive pulmonary nodule on today's study. No focal airspace consolidation. No pulmonary edema or pleural effusion. Upper Abdomen: Unremarkable. Musculoskeletal: Sclerotic lesion left L2 transverse process stable back to a study from 02/24/2015. IMPRESSION: 1. Continued interval stability of numerous tiny bilateral pulmonary nodules. 2. Stable aneurysm of the ascending thoracic aorta, measuring 4.7 cm today compared to 4.8 cm previously. Recommend semi-annual imaging followup by CTA or MRA and referral to cardiothoracic surgery if not  already obtained. This recommendation follows 2010 ACCF/AHA/AATS/ACR/ASA/SCA/SCAI/SIR/STS/SVM Guidelines for the Diagnosis and Management of Patients With Thoracic Aortic Disease. Circulation. 2010; 121: V409-W119. 3. Coronary artery atherosclerosis. Electronically Signed   By: Kennith Center M.D.   On: 06/07/2016 16:00   Ct Angio Chest Aorta W/cm &/or Wo/cm  12/02/2014   CLINICAL DATA:  Follow-up lung nodule and thoracic aortic aneurysm, ex-smoker.  EXAM: CT ANGIOGRAPHY CHEST WITH CONTRAST  TECHNIQUE: Multidetector CT imaging of the chest was performed using the standard protocol during bolus administration of intravenous contrast. Multiplanar CT image reconstructions and MIPs were obtained to evaluate the vascular anatomy.  CONTRAST:  75 cc of Isovue 370  COMPARISON:  Chest CTs dated 08/06/2014, 12/03/2013, and 11/13/2012.  FINDINGS: The benign 4 mm pulmonary nodule within the  left lateral lingula is stable. The 5 mm pulmonary nodule within the medial aspects of the superior segment of the right lower lobe is stable compared to the chest CT of 08/06/2014 and is also stable compared to the earlier study of 11/13/2012 indicating benignity.  There is a new 6 mm pulmonary nodule within the lateral aspects of the right lower lobe and there is a new 5 mm pulmonary nodule within the medial aspects of the right lower lobe (series 5, images 41 and 40 respectively). There is an additional questionable pulmonary nodule within the right lower lobe posteriorly measuring 7 mm (series 5, image 38). No other new lung findings seen.  The dilatation of the ascending thoracic aorta is unchanged at 4.6 cm diameter. Distal thoracic aortic arch is also again mildly prominent measuring 3.7 cm diameter, unchanged. No aortic dissection.  Heart size is upper normal, unchanged. Coronary artery calcifications noted within the left anterior descending and left circumflex coronary arteries. No mass or enlarged lymph nodes seen within the  mediastinum or perihilar regions. Supraclavicular regions are unremarkable. Limited images of the upper abdomen are unremarkable. Degenerative changes within the thoracic spine are unchanged. No acute osseous abnormality.  Review of the MIP images confirms the above findings.  IMPRESSION: 1. New pulmonary nodules within the right lower lobe, as detailed above. These include a new 6 mm pulmonary nodule within the lateral aspects of the right lower lobe and a new 5 mm pulmonary nodule within the medial aspects of the right lower lobe. There is also a questionable pulmonary nodule within the right lower lobe posteriorly measuring 7 mm greatest dimension. As the patient is at high risk for bronchogenic carcinoma, follow-up chest CT at 3-88months is recommended. This recommendation follows the consensus statement: Guidelines for Management of Small Pulmonary Nodules Detected on CT Scans: A Statement from the Fleischner Society as published in Radiology 2005; 237:395-400. 2. Remainder of the pulmonary nodules have been stable for at least 2 years indicating benignity. 3. Stable ascending thoracic aortic aneurysm measuring 4.6 cm diameter. 4. Coronary artery calcifications.   Electronically Signed   By: Bary Richard M.D.   On: 12/02/2014 14:26     12/03/2013   CLINICAL DATA:  Thoracic aortic aneurysm.  EXAM: CT ANGIOGRAPHY CHEST WITH CONTRAST  TECHNIQUE: Multidetector CT imaging of the chest was performed using the standard protocol during bolus administration of intravenous contrast. Multiplanar CT image reconstructions and MIPs were obtained to evaluate the vascular anatomy.  CONTRAST:  48mL OMNIPAQUE IOHEXOL 350 MG/ML SOLN  COMPARISON:  CT scan of November 13, 2012 and December 26, 2011.  FINDINGS: No pneumothorax or pleural effusion is noted. Stable 4.5 mm nodule is noted in left lingula laterally which is unchanged compared with 2013 and can be considered benign at this point. 4.8 cm nodule is noted in the medial  portion of the superior segment of the right lower lobe best seen on image number 26 of series 5. This is unchanged compared to prior exam. No acute pulmonary disease is noted. Coronary artery calcifications are again noted. No significant mediastinal mass or adenopathy is noted. Ascending thoracic aorta has maximum measured diameter 4.6 cm which is not changed compared to prior exam. No dissection or significant atheromatous disease is noted. Normal caliber of descending thoracic aorta is noted. Visualized portion of upper abdomen appears normal. The great vessels appear to be widely patent without significant stenosis. No significant osseous abnormality is noted.  Review of the MIP images confirms the above  findings.  IMPRESSION: 4.6 cm ascending thoracic aortic aneurysm is noted which is unchanged compared to prior exam.  Coronary artery calcifications are noted consistent with coronary artery disease.  Stable stable 4.8 cm nodule seen in superior segment of right lower lobe ; followup chest CT in 12 months is recommended to ensure stability.   Electronically Signed   By: Roque Lias M.D.   On: 12/03/2013 14:47   I have discussed with radiology and reviewed the films, should be 4.8 mm not cm nodule  Ct Angio Chest Aorta W/cm &/or Wo/cm  11/13/2012   *RADIOLOGY REPORT*  Clinical Data: Aortic aneurysm  CT ANGIOGRAPHY CHEST  Technique:  Multidetector CT imaging of the chest using the standard protocol during bolus administration of intravenous contrast. Multiplanar reconstructed images including MIPs were obtained and reviewed to evaluate the vascular anatomy.  Contrast: OMNIPAQUE IOHEXOL 350 MG/ML SOLN  Comparison: 12/26/2011  Findings: Aortic diameter at the sinus of Valsalva, sinotubular junction, and ascending aorta are 5.0 cm, 3.7 cm, and 4.6 cm respectively.  No evidence of aortic dissection or transection. Little if any plaque within the thoracic aorta.  Innominate artery, right vertebral artery,  right subclavian artery, right common carotid artery, left common carotid artery, left subclavian artery, and left vertebral artery are all widely patent within the confines of the examination.  Right coronary artery stent is noted.  Calcification in the circumflex and left anterior descending coronary vessels are noted. Distal right coronary artery calcifications are noted.  No obvious filling defects in the pulmonary arterial tree to suggest acute pulmonary thromboembolism.  Negative abnormal mediastinal adenopathy.  No pericardial effusion.  Sub centimeter right thyroid hypodensity on image 17 of series 4.  No pneumothorax.  No pleural effusion.  Calcified lymph nodes in the mediastinum or are noted.  Sub centimeter pulmonary nodules are stable.  Cervical spine fusion hardware is in place.  The right-sided screw within the C7 vertebral body has retracted and is within the prevertebral soft tissues.  Images of the upper abdomen demonstrate diffuse hepatic steatosis. Prominence of the adrenal glands without focal mass is stable.  IMPRESSION: Maximal diameter of the ascending aorta is 4.6 cm.  Previously, it was measured at 4.9 cm.  It is therefore not enlarging.  No evidence of aortic dissection.  Sub centimeter right thyroid hypodensity is stable in retrospect compared with a study from 2012.  This supports benign etiology.  Cervical spine fusion hardware is in place.  One of the screws in C7 has retracted and is residing within the prevertebral soft tissues.   Original Report Authenticated By: Jolaine Click, M.D.    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  Follow-up study.  CT ANGIOGRAPHY OF THE HEART, CORONARY ARTERY, STRUCTURE, AND MORPHOLOGY  CONTRAST:  100 ml of Omnipaque-300.  COMPARISON:  CTA chest 05/14/2011.  TECHNIQUE:  CT angiography of the coronary vessels was  performed on a 256 channel system using prospective ECG gating.  A scout and noncontrast exam (for calcium scoring) were performed.  Circulation time was measured using a test bolus.  Coronary CTA was performed with sub mm slice collimation during portions of the cardiac cycle after prior injection of iodinated contrast.  Imaging post processing was performed on an independent workstation creating multiplanar and 3-D images, and quantitative analysis of the heart and coronary arteries.  Note that this exam  targets the heart and the chest was not imaged in its entirety.  PREMEDICATION: Lopressor 5 mg, IV Nitroglycerin 400 mcg, sublingual.  FINDINGS: Technical quality:  Good  Heart rate:  55-60  CORONARY ARTERIES: Left main coronary artery:  Not present (there are separate ostia for the left anterior descending and left circumflex coronary arteries directly off the left sinus of Valsalva). Left anterior descending:  Mildly diseased with mixed calcified noncalcified atherosclerotic plaque with areas of stenosis up to 25 - 50% in the mid LAD. Left circumflex:  Large caliber vessel that quickly gives rise to two large obtuse marginal vessels.  Proximal left circumflex demonstrates only minimal calcified plaque but no associated stenosis. Obtuse Marginal 1: Mildly diseased with calcified plaque with no associated luminal stenosis. Obtuse Marginal 2: Extensive calcified atherosclerotic plaque proximally with stenosis >50% (possibly >75%), however, secondary to the high degree of calcification, accurate assessment for luminal stenosis is not possible on this examination. Right coronary artery:  There is a stent in the proximal right coronary artery which is grossly patent.  There is some calcified plaque at the distal end of the stent where the vessel is slightly acutely angulated.  There appears to be some stenosis in this region, however, it is favored to be approximately 25 - 50% (accurate assessment is limited by the  high-density the stent material and the heavily calcified plaque).  The remainder of the RCA is otherwise mildly diseased with nonobstructive plaque. Posterior descending artery:  Patent with a mixed calcified noncalcified plaque.  Secondary to the small size of the vessel and excessive image noise, accurate assessment for stenosis is not possible on this examination, however, there appears to be at least 25 - 50% stenosis of the mid PDA. Dominance:  Right  CORONARY CALCIUM: Total Agatston Score:  655 MESA database percentile:  80th Comment - it appears as though the patient has moved or breathed during image acquisition, such that the a portion of the coronary arteries was incompletely visualized.  This likely underestimates the patient's true calcium score.  AORTA AND PULMONARY MEASUREMENTS: Aortic root (21 - 40 mm):             29 mm  at the annulus             49 mm  at the sinuses of Valsalva         36 mm  at the sinotubular junction Ascending aorta ( <  40 mm):  50 mm Aortic arch : 34 mm Descending aorta:  28 mm Main pulmonary artery:  ( <  30 mm):  35 mm  EXTRACARDIAC FINDINGS: Several tiny pulmonary nodules measuring 4 mm are scattered throughout the lungs bilaterally and are completely unchanged compared to prior examinations (dating back to 2007); these can be considered radiographically benign requiring no further imaging follow-up.  Small calcified granulomas are also noted in the posterior aspect of the left upper lobe.  Multiple calcified left hilar and mediastinal lymph nodes are noted.  No acute consolidative airspace disease.  No pleural effusions.  Visualized portions of the upper abdomen are unremarkable.  IMPRESSION:  1. Ascending thoracic aortic aneurysm is similar in size measuring 4.9 cm on today's examination.  No evidence of thoracic aortic dissection at this time. 2.  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  The patient's  aortic valve appears to be tricuspid.  Additionally, there is no effacement of the sinotubular junction. 3.  Multivessel coronary artery disease, as detailed above.  Most importantly, there is a heavily calcified plaque in the obtuse marginal 2 branch (a large vessel) which appears to be stenotic (i.e., at least 50% diameter stenosis, and possbily in excess of 75%), however, accurate assessment for percentage stenosis is limited on this examination secondary to the high degree of calcification. Clinical correlation is recommended, with consideration for further evaluation with stress testing if there is clinical concern for inducible ischemia. 4.  Coronary artery calcium scoring is at least 655 (the patient moved during the image acquisition, resulting in incomplete coverage of the coronary arteries which likely underestimates the true calcium score), which is 80th percentile for patient's of matched age, gender and race/ethnicity. 5.  Right coronary artery dominance.   Original Report Authenticated By: Florencia Reasons, M.D.     Recent Lab Findings: Lab Results  Component Value Date   WBC 8.4 06/09/2019   HGB 15.2 06/09/2019   HCT 46.6 06/09/2019   PLT 205 06/09/2019   GLUCOSE 102 (H) 06/09/2019   CHOL 184 11/14/2010   TRIG 143.0 11/14/2010   HDL 48.60 11/14/2010   LDLCALC 107 (H) 11/14/2010   ALT 26 06/09/2019   AST 24 06/09/2019   NA 141 06/09/2019   K 4.5 06/09/2019   CL 103 06/09/2019   CREATININE 1.32 (H) 06/09/2019   BUN 30 (H) 06/09/2019   CO2 29 06/09/2019   TSH 4.472 06/09/2019   INR 1.0 06/06/2011   Cardiac Catheterization Operative Report  JAQUEL GLASSBURN [id number] 3/1/201312:05 PM Ezequiel Kayser, MD, MD  Procedure Performed:  1. Left Heart Catheterization 2. Selective Coronary Angiography 3. Left ventricular angiogram 4. Aortic root angiogram  Operator: Verne Carrow, MD  Indication:  Pt with known CAD and thoracic aortic aneurysm. The aortic aneurysm  has enlarged over the last year and we recently referred Mr. Halsted to see Dr. Tyrone Sage. Plans are being made for aortic root replacement.   Cardiac cath today to exclude progression of CAD.                             Procedure Details: The risks, benefits, complications, treatment options, and expected outcomes were discussed with the patient. The patient and/or family concurred with the proposed plan, giving informed consent. The patient was brought to the cath lab after IV hydration was begun and oral premedication was given. The patient was further sedated with Versed and Fentanyl. The right groin was prepped and draped in the usual manner. Using the modified Seldinger access technique, a 4 French sheath was placed in the right femoral artery. A JL-5 catheter was used to engage the LAD with sub-selective shots of the Circumflex. An AL-1 catheter was used to non-selectively inject the Circumflex artery. A 3DRC catheter was used to engage and inject the RCA.  A pigtail catheter was used to perform a left ventricular angiogram. The catheter was pulled back across the aortic valve. An aortic root angiogram was performed.   There were no immediate complications. The patient was taken to the recovery area in stable condition.   Hemodynamic Findings: Central aortic pressure: 130/72 Left ventricular pressure: 130/17/24  Angiographic Findings:  Left main:  There are separate ostia for the LAD and Circumflex.  Left Anterior Descending Artery: Large vessel that courses to the apex. No obstructive disease noted.Small diagonal branch.   Circumflex Artery: Large caliber vessel with no obstructive disease noted.   Right Coronary Artery: Very  large, dominant vessel with patent proximal stent. There is mild plaque disease in the mid and distal RCA. The small caliber PDA has 60% stenosis.   Left Ventricular Angiogram: Dilated LV. LVEF=50%.   Aortic root: Dilated aortic root and ascending aorta.    Impression: 1. Single vessel CAD with patent stent RCA 2. Preserved LV systolic function 3. 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   Recommendations: Pt will follow up with Dr. Tyrone Sage for further discussions regarding aortic root replacement.        Complications:  None. The patient tolerated the procedure well.                        Electronically signed by Kathleene Hazel, MD at 06/08/2011 12:15 PM     ECHOCARDIOGRAM REPORT      Patient Name:  ZACHERY NISWANDER Date of Exam: 02/26/2019  Medical Rec #: [id number]    Height:    79.0 in  Accession #:  [id number]   Weight:    287.0 lb  Date of Birth: 1940-06-02    BSA:     2.66 m  Patient Age:  78 years    BP:      118/62 mmHg  Patient Gender: M        HR:      80 bpm.  Exam Location: [address]   Procedure: 2D Echo, Cardiac Doppler, Color Doppler and Intracardiac       Opacification Agent   Indications:  I48.91 Atrial Fibrillation         I25.10 Coronary artery disease    History:    Patient has prior history of Echocardiogram examinations,  most         recent 12/16/2017. STENT; Risk Factors:Dyslipidemia.  Coronary         artery disease. Ischemic heart disease. SVT. Thoracic  aortic         aneurysm.    Sonographer:  Daphine Deutscher RDCS  Referring Phys: [id number] Sharrell Ku BHAGAT   IMPRESSIONS    1. Left ventricular ejection fraction, by visual estimation, is 50 to  55%. The left ventricle has normal function. There is moderately increased  left ventricular hypertrophy.  2. Definity contrast agent was given IV to delineate the left ventricular  endocardial borders.  3. Left ventricular diastolic function could not be evaluated.  4. Global right ventricle has normal systolic function.The right  ventricular size is normal.  5. Left atrial size was normal.  6. Right  atrial size was normal.  7. The mitral valve is normal in structure. Trace mitral valve  regurgitation. No evidence of mitral stenosis.  8. The tricuspid valve is normal in structure. Tricuspid valve  regurgitation is trivial.  9. The aortic valve is tricuspid. Aortic valve regurgitation is mild.  Mild aortic valve sclerosis without stenosis.  10. The pulmonic valve was not well visualized. Pulmonic valve  regurgitation is not visualized.  11. Aortic dilatation noted.  12. There is moderate to severe dilatation of the aortic root and of the  ascending aorta measuring 52 mm.  13. The inferior vena cava is normal in size with greater than 50%  respiratory variability, suggesting right atrial pressure of 3 mmHg.  14. Definity used; normal LV systolic function; moderate LVH; moderate to  severe dilatation of aortic root/ascending aorta (5.2 cm; suggest CTA or  MRA to further assess); mild AI.   FINDINGS  Left Ventricle: Left ventricular ejection fraction, by visual estimation,  is 50 to 55%. The left ventricle has normal function. Definity contrast  agent was given IV to delineate the left ventricular endocardial borders.  There is moderately increased  left ventricular hypertrophy. The left ventricular diastology could not be  evaluated due to atrial fibrillation. Left ventricular diastolic function  could not be evaluated. Normal left atrial pressure.   Right Ventricle: The right ventricular size is normal. Global RV systolic  function is has normal systolic function. The tricuspid regurgitant  velocity is 2.06 m/s, and with an assumed right atrial pressure of 10  mmHg, the estimated right ventricular  systolic pressure is normal at 27.1 mmHg.   Left Atrium: Left atrial size was normal in size.   Right Atrium: Right atrial size was normal in size   Pericardium: There is no evidence of pericardial effusion.   Mitral Valve: The mitral valve is normal in structure. No evidence  of  mitral valve stenosis by observation. Trace mitral valve regurgitation.   Tricuspid Valve: The tricuspid valve is normal in structure. Tricuspid  valve regurgitation is trivial.   Aortic Valve: The aortic valve is tricuspid. Aortic valve regurgitation is  mild. Aortic regurgitation PHT measures 284 msec. Mild aortic valve  sclerosis is present, with no evidence of aortic valve stenosis.   Pulmonic Valve: The pulmonic valve was not well visualized. Pulmonic valve  regurgitation is not visualized.   Aorta: Aortic dilatation noted. There is moderate to severe dilatation of  the aortic root and of the ascending aorta measuring 52 mm.   Venous: The inferior vena cava is normal in size with greater than 50%  respiratory variability, suggesting right atrial pressure of 3 mmHg.    LEFT VENTRICLE  PLAX 2D  LVIDd:     5.30 cm Diastology  LVIDs:     3.90 cm LV e' lateral:  10.56 cm/s  LV PW:     1.35 cm LV E/e' lateral: 7.3  LV IVS:    1.40 cm LV e' medial:  6.49 cm/s  LVOT diam:   2.50 cm LV E/e' medial: 11.9  LV SV:     69 ml  LV SV Index:  25.51  LVOT Area:   4.91 cm     RIGHT VENTRICLE  RV Basal diam: 4.20 cm  RV S prime:   11.90 cm/s  TAPSE (M-mode): 1.5 cm   LEFT ATRIUM       Index    RIGHT ATRIUM      Index  LA diam:    4.80 cm 1.81 cm/m RA Area:   16.80 cm  LA Vol (A2C):  52.4 ml 19.72 ml/m RA Volume:  51.60 ml 19.42 ml/m  LA Vol (A4C):  63.5 ml 23.90 ml/m  LA Biplane Vol: 57.8 ml 21.75 ml/m  AORTIC VALVE  LVOT Vmax:  101.93 cm/s  LVOT Vmean: 71.567 cm/s  LVOT VTI:  0.184 m  AI PHT:   284 msec    AORTA  Ao Root diam: 5.20 cm  Ao Asc diam: 5.20 cm   MV E velocity: 76.92 cm/s 103 cm/s TRICUSPID VALVE                   TR Peak grad:  17.1 mmHg                   TR Vmax:    219.00 cm/s                     SHUNTS  Systemic VTI: 0.18 m                   Systemic Diam: 2.50 cm     Olga MillersBrian Crenshaw MD  Electronically signed by Olga MillersBrian Crenshaw MD  Signature Date/Time: 02/26/2019/2:10:19 PM     Aortic Size Index=     4.8   /Body surface area is 2.66 meters squared. = 1.8  < 2.75 cm/m2      4% risk per year 2.75 to 4.25          8% risk per year > 4.25 cm/m2    20% risk per year  cross sectional area of aorta cm2/height in meters > 10 consider  surgery 16.6/2= 8.3    Assessment / Plan:  1/ Known dilated ascending aorta stable at 4.8 cm unchanged on review today,  will obtain follow-up CTA of the chest in 1 year 2/recent A. fib ablation and cardioversion now appears in sinus rhythm   Delight OvensEdward B Juwaun Inskeep MD  [address] [phone] Office [phone] 09/03/2019 5:59 PM

## 2019-09-03 ENCOUNTER — Ambulatory Visit (INDEPENDENT_AMBULATORY_CARE_PROVIDER_SITE_OTHER): Payer: Medicare PPO | Admitting: Cardiothoracic Surgery

## 2019-09-03 ENCOUNTER — Ambulatory Visit
Admission: RE | Admit: 2019-09-03 | Discharge: 2019-09-03 | Disposition: A | Discharge status: Home / Self Care | Payer: Medicare PPO | Source: Ambulatory Visit | Attending: Cardiothoracic Surgery | Admitting: Cardiothoracic Surgery

## 2019-09-03 ENCOUNTER — Other Ambulatory Visit: Payer: Self-pay

## 2019-09-03 VITALS — BP 115/80 | HR 70 | Temp 98.8°F | Ht 79.0 in | Wt 280.0 lb

## 2019-09-03 DIAGNOSIS — I712 Thoracic aortic aneurysm, without rupture, unspecified: Secondary | ICD-10-CM

## 2019-09-03 MED ORDER — IOPAMIDOL (ISOVUE-370) INJECTION 76%
65.0000 mL | Freq: Once | INTRAVENOUS | Status: AC | PRN
  Administered 2019-09-03: 65 mL via INTRAVENOUS

## 2019-09-25 ENCOUNTER — Telehealth: Payer: Self-pay | Admitting: *Deleted

## 2019-09-25 NOTE — Telephone Encounter (Signed)
   [facility] Pre-operative Risk Assessment    HEARTCARE STAFF: - Please ensure there is not already an duplicate clearance open for this procedure. - Under Visit Info/Reason for Call, type in Other and utilize the format Clearance MM/DD/YY or Clearance TBD. Do not use dashes or single digits. - If request is for dental extraction, please clarify the # of teeth to be extracted.  Request for surgical clearance:  1. What type of surgery is being performed? LEFT KNEE SCOPE MENISECTOMY   2. When is this surgery scheduled? TBD   3. What type of clearance is required (medical clearance vs. Pharmacy clearance to hold med vs. Both)? BOTH  4. Are there any medications that need to be held prior to surgery and how long? ELIQUIS   5. Practice name and name of physician performing surgery? [facility]; DR. Christia Reading MURPHY   6. What is the office phone number? [phone] [phone] (KELLY)   7.   What is the office fax number? [place].   Anesthesia type (None, local, MAC, general) ? CHOICE   Julaine Hua 09/25/2019, 2:06 PM  _________________________________________________________________   (provider comments below)

## 2019-09-25 NOTE — Telephone Encounter (Signed)
Can you please comment on eliquis? 

## 2019-09-25 NOTE — Telephone Encounter (Signed)
Patient with diagnosis of A Fib on Eliquis for anticoagulation.    Had ablation on 05/12/19  Procedure: LEFT KNEE SCOPE MENISECTOMY  Date of procedure: TBD  CHADS2-VASc score of  4 (HTN, AGE x 2, CAD)  CrCl 72 mL/min using adjusted body weight  Patient moderate risk off anticoagulation, however due to ablation 4 months ago, will seek input from Dr Johney Frame

## 2019-10-05 NOTE — Telephone Encounter (Addendum)
   Primary Cardiologist: Verne Carrow, MD  Chart reviewed as part of pre-operative protocol coverage. Per prior notes, pharmD routed anticoagulation question to Dr. Johney Frame. Awaiting reply. After hearing anticoag input, will plan to call patient to ensure no new symptoms.  Dr. Johney Frame - Please route response to P CV DIV PREOP (the pre-op pool).  Thank you.  Laurann Montana, PA-C 10/05/2019, 9:02 AM

## 2019-10-09 NOTE — Telephone Encounter (Signed)
Routing to preop to handle

## 2019-10-09 NOTE — Telephone Encounter (Signed)
   Primary Cardiologist: Verne Carrow, MD  Chart reviewed as part of pre-operative protocol coverage. Given past medical history and time since last visit, based on ACC/AHA guidelines, KHIRY PASQUARIELLO would be at acceptable risk for the planned procedure without further cardiovascular testing.   Patient with diagnosis of A Fib on Eliquis for anticoagulation.    Had ablation on 05/12/19  Procedure: LEFT KNEE SCOPE MENISECTOMY Date of procedure: TBD  CHADS2-VASc score of  4 (HTN, AGE x 2, CAD)  CrCl 72 mL/min using adjusted body weight  Patient moderate risk off anticoagulation  His Eliquis may be held for 2 days prior to his surgery.  Please resume as soon as hemostasis is achieved.  I will route this recommendation to the requesting party via Epic fax function and remove from pre-op pool.  Please call with questions.  Thomasene Ripple. Chamberlain Steinborn NP-C    10/09/2019, 11:51 AM [facility] [address] Office [phone] Fax [phone]

## 2019-10-09 NOTE — Telephone Encounter (Signed)
As he is > 3 months post ablation, ok to hold eliquis without bridge perioperatively. Proceed with surgery if medically indicated.

## 2019-10-22 DIAGNOSIS — S83272A Complex tear of lateral meniscus, current injury, left knee, initial encounter: Secondary | ICD-10-CM | POA: Diagnosis not present

## 2019-10-22 DIAGNOSIS — M94262 Chondromalacia, left knee: Secondary | ICD-10-CM | POA: Diagnosis not present

## 2019-10-22 DIAGNOSIS — G8918 Other acute postprocedural pain: Secondary | ICD-10-CM | POA: Diagnosis not present

## 2019-10-22 DIAGNOSIS — M1712 Unilateral primary osteoarthritis, left knee: Secondary | ICD-10-CM | POA: Diagnosis not present

## 2019-10-22 DIAGNOSIS — S83282A Other tear of lateral meniscus, current injury, left knee, initial encounter: Secondary | ICD-10-CM | POA: Diagnosis not present

## 2019-10-22 DIAGNOSIS — M6752 Plica syndrome, left knee: Secondary | ICD-10-CM | POA: Diagnosis not present

## 2019-11-09 DIAGNOSIS — E118 Type 2 diabetes mellitus with unspecified complications: Secondary | ICD-10-CM | POA: Diagnosis not present

## 2019-11-09 DIAGNOSIS — E7849 Other hyperlipidemia: Secondary | ICD-10-CM | POA: Diagnosis not present

## 2019-11-09 DIAGNOSIS — Z0000 Encounter for general adult medical examination without abnormal findings: Secondary | ICD-10-CM | POA: Diagnosis not present

## 2019-11-13 DIAGNOSIS — S76112D Strain of left quadriceps muscle, fascia and tendon, subsequent encounter: Secondary | ICD-10-CM | POA: Diagnosis not present

## 2019-11-13 DIAGNOSIS — M25562 Pain in left knee: Secondary | ICD-10-CM | POA: Diagnosis not present

## 2019-11-13 DIAGNOSIS — M25662 Stiffness of left knee, not elsewhere classified: Secondary | ICD-10-CM | POA: Diagnosis not present

## 2019-11-13 DIAGNOSIS — M6281 Muscle weakness (generalized): Secondary | ICD-10-CM | POA: Diagnosis not present

## 2019-11-16 DIAGNOSIS — I2510 Atherosclerotic heart disease of native coronary artery without angina pectoris: Secondary | ICD-10-CM | POA: Diagnosis not present

## 2019-11-16 DIAGNOSIS — I4891 Unspecified atrial fibrillation: Secondary | ICD-10-CM | POA: Diagnosis not present

## 2019-11-16 DIAGNOSIS — I259 Chronic ischemic heart disease, unspecified: Secondary | ICD-10-CM | POA: Diagnosis not present

## 2019-11-16 DIAGNOSIS — R82998 Other abnormal findings in urine: Secondary | ICD-10-CM | POA: Diagnosis not present

## 2019-11-16 DIAGNOSIS — Z125 Encounter for screening for malignant neoplasm of prostate: Secondary | ICD-10-CM | POA: Diagnosis not present

## 2019-11-16 DIAGNOSIS — I712 Thoracic aortic aneurysm, without rupture: Secondary | ICD-10-CM | POA: Diagnosis not present

## 2019-11-16 DIAGNOSIS — Z0000 Encounter for general adult medical examination without abnormal findings: Secondary | ICD-10-CM | POA: Diagnosis not present

## 2019-11-16 DIAGNOSIS — Z1212 Encounter for screening for malignant neoplasm of rectum: Secondary | ICD-10-CM | POA: Diagnosis not present

## 2019-11-16 DIAGNOSIS — E118 Type 2 diabetes mellitus with unspecified complications: Secondary | ICD-10-CM | POA: Diagnosis not present

## 2019-11-16 DIAGNOSIS — I10 Essential (primary) hypertension: Secondary | ICD-10-CM | POA: Diagnosis not present

## 2019-11-16 DIAGNOSIS — E785 Hyperlipidemia, unspecified: Secondary | ICD-10-CM | POA: Diagnosis not present

## 2019-11-16 DIAGNOSIS — D6869 Other thrombophilia: Secondary | ICD-10-CM | POA: Diagnosis not present

## 2019-11-17 DIAGNOSIS — S76112D Strain of left quadriceps muscle, fascia and tendon, subsequent encounter: Secondary | ICD-10-CM | POA: Diagnosis not present

## 2019-11-17 DIAGNOSIS — M25562 Pain in left knee: Secondary | ICD-10-CM | POA: Diagnosis not present

## 2019-11-17 DIAGNOSIS — M6281 Muscle weakness (generalized): Secondary | ICD-10-CM | POA: Diagnosis not present

## 2019-11-17 DIAGNOSIS — M25662 Stiffness of left knee, not elsewhere classified: Secondary | ICD-10-CM | POA: Diagnosis not present

## 2019-11-23 ENCOUNTER — Ambulatory Visit: Payer: Medicare PPO | Admitting: Internal Medicine

## 2019-11-23 ENCOUNTER — Encounter: Payer: Self-pay | Admitting: Internal Medicine

## 2019-11-23 ENCOUNTER — Other Ambulatory Visit: Payer: Self-pay

## 2019-11-23 VITALS — BP 118/78 | HR 67 | Ht 79.0 in | Wt 288.6 lb

## 2019-11-23 DIAGNOSIS — I4819 Other persistent atrial fibrillation: Secondary | ICD-10-CM | POA: Diagnosis not present

## 2019-11-23 DIAGNOSIS — I10 Essential (primary) hypertension: Secondary | ICD-10-CM

## 2019-11-23 DIAGNOSIS — I2510 Atherosclerotic heart disease of native coronary artery without angina pectoris: Secondary | ICD-10-CM | POA: Diagnosis not present

## 2019-11-23 NOTE — Patient Instructions (Signed)
Medication Instructions:  Your physician recommends that you continue on your current medications as directed. Please refer to the Current Medication list given to you today.  *If you need a refill on your cardiac medications before your next appointment, please call your pharmacy*  Lab Work: None ordered.  If you have labs (blood work) drawn today and your tests are completely normal, you will receive your results only by:  MyChart Message (if you have MyChart) OR  A paper copy in the mail If you have any lab test that is abnormal or we need to change your treatment, we will call you to review the results.  Testing/Procedures: None ordered.  Follow-Up: At [facility], you and your health needs are our priority.  As part of our continuing mission to provide you with exceptional heart care, we have created designated Provider Care Teams.  These Care Teams include your primary Cardiologist (physician) and Advanced Practice Providers (APPs -  Physician Assistants and Nurse Practitioners) who all work together to provide you with the care you need, when you need it.  We recommend signing up for the patient portal called "MyChart".  Sign up information is provided on this After Visit Summary.  MyChart is used to connect with patients for Virtual Visits (Telemedicine).  Patients are able to view lab/test results, encounter notes, upcoming appointments, etc.  Non-urgent messages can be sent to your provider as well.   To learn more about what you can do with MyChart, go to [email or web address].    Your next appointment:   Your physician wants you to follow-up in: 02/24/2020 at 11:30 at the [address] office   Other Instructions:

## 2019-11-23 NOTE — Progress Notes (Signed)
PCP: [name], MD Primary Cardiologist: Dr Clifton Zack Primary EP: Dr Owens Loffler is a 79 y.o. male who presents today for routine electrophysiology followup.  Since last being seen in our clinic, the patient reports doing very well.  Today, he denies symptoms of palpitations, chest pain, shortness of breath,  lower extremity edema, dizziness, presyncope, or syncope.  The patient is otherwise without complaint today.   Past Medical History:  Diagnosis Date  . Arthritis   . Coronary artery disease    Stent 2004  . GERD (gastroesophageal reflux disease)   . History of echocardiogram    Echo 5/17: mild LVH, EF 50-55%, no RWMA, Gr 2 DD, mild to mod AI, PASP 32 mmHg  . History of nuclear stress test    Myoview 5/17 - EF 48%, no scar, no ischemia. Low Risk  . History of pneumonia    79 years old  . Hx of Bell's palsy    left face  . Hypercholesteremia    managed by dr Waynard Edwards  . Hypertension   . 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   . Lung nodules    noted on past CT scans  . Malaria   . SVT (supraventricular tachycardia) (HCC)    remote episode during exercise test  . Thoracic aortic aneurysm [facility])    last scan in Jan 2013. Now measuring 5.0cm. Referred to Dr. Tyrone Sage  . Wears dentures    bottom  . Wears glasses    Past Surgical History:  Procedure Laterality Date  . ATRIAL FIBRILLATION ABLATION N/A 05/12/2019   Procedure: ATRIAL FIBRILLATION ABLATION;  Surgeon: Hillis Range, MD;  [facility];  Service: Cardiovascular;  Laterality: N/A;  . CARDIOVERSION N/A 03/13/2019   Procedure: CARDIOVERSION;  Surgeon: Pricilla Riffle, MD;  Location: [facility] ENDOSCOPY;  Service: Cardiovascular;  Laterality: N/A;  . CARDIOVERSION N/A 04/15/2019   Procedure: CARDIOVERSION;  Surgeon: Vesta Mixer, MD;  Location: [facility] ENDOSCOPY;  Service: Cardiovascular;  Laterality: N/A;   . CARDIOVERSION N/A 06/16/2019   Procedure: CARDIOVERSION;  Surgeon: Chilton Si, MD;  Location: [facility] ENDOSCOPY;  Service: Cardiovascular;  Laterality: N/A;  . CERVICAL FUSION  03-2010   c2-c7  . COLONOSCOPY     x4  . CORONARY STENT PLACEMENT  2004  . dental implant     x 2  . ELBOW SURGERY  1997   lt and rt  . EYE SURGERY    . HIP PINNING,CANNULATED Right 05/11/2014   Procedure: CANNULATED HIP PINNING;  Surgeon: Sheral Apley, MD;  Location[facility] OR;  Service: Orthopedics;  Laterality: Right;  . RETINAL DETACHMENT SURGERY  1975   lt  . rotator cuff surgery  1997   lt  . SHOULDER ARTHROSCOPY  2/15   right-got cardiac clearance-gsc  . TONSILLECTOMY    . TRIGGER FINGER RELEASE Left 10/12/2013   Procedure: LEFT LONG FINGER TRIGGER RELEASE;  Surgeon: Jodi Marble, MD;  Location: [facility];  Service: Orthopedics;  Laterality: Left;    ROS- all systems are reviewed and negatives except as per HPI above  Current Outpatient Medications  Medication Sig Dispense Refill  . apixaban (ELIQUIS) 5 MG TABS tablet Take 1 tablet (5 mg total) by mouth 2 (two) times daily. 60 tablet 5  . chlorthalidone (HYGROTON) 25 MG tablet Take 25 mg by mouth daily.     Marland Kitchen  clobetasol ointment (TEMOVATE) 0.05 % Apply 1 application topically 2 (two) times daily as needed (itchy scaly on legs).     . Coenzyme Q10 (COQ-10) 200 MG CAPS Take 200 mg by mouth daily.     . colesevelam (WELCHOL) 625 MG tablet Take 3,750 mg by mouth daily after supper. 6 tablets    . ezetimibe (ZETIA) 10 MG tablet Take 5 mg by mouth every evening.     . fluticasone (FLONASE) 50 MCG/ACT nasal spray Place 1 spray into both nostrils daily as needed for allergies or rhinitis.    . Glucosamine HCl 1500 MG TABS Take 3,000 mg by mouth daily.    . hydrocortisone 2.5 % cream Apply 1 application topically as needed (eye brows flakey). Use on head    . Ivermectin (SOOLANTRA EX) Apply 1 application topically daily as needed  (Rosacea).     Marland Kitchen ketoconazole (NIZORAL) 2 % cream Apply 1 application topically daily as needed for irritation (Corner of mouth).     . lisinopril (PRINIVIL,ZESTRIL) 40 MG tablet Take 40 mg by mouth every evening.     Marland Kitchen LIVALO 4 MG TABS Take 2 mg by mouth at bedtime.     . Multiple Vitamin (MULTIVITAMIN WITH MINERALS) TABS tablet Take 1 tablet by mouth daily.     . mupirocin ointment (BACTROBAN) 2 % Place 1 application into the nose daily as needed (Tic bits).    . nitroGLYCERIN (NITROSTAT) 0.4 MG SL tablet Place 1 tablet (0.4 mg total) under the tongue every 5 (five) minutes as needed for chest pain. 25 tablet 1  . omeprazole (PRILOSEC) 20 MG capsule Take 20 mg by mouth daily.      No current facility-administered medications for this visit.    Physical Exam: Vitals:   11/23/19 1019  BP: 118/78  Pulse: 67  SpO2: 96%  Weight: 288 lb 9.6 oz (130.9 kg)  Height: 6\' 7"  (2.007 m)    GEN- The patient is well appearing, alert and oriented x 3 today.   Head- normocephalic, atraumatic Eyes-  Sclera clear, conjunctiva pink Ears- hearing intact Oropharynx- clear Lungs- Clear to ausculation bilaterally, normal work of breathing Heart- Regular rate and rhythm, no murmurs, rubs or gallops, PMI not laterally displaced GI- soft, NT, ND, + BS Extremities- no clubbing, cyanosis, or edema  Wt Readings from Last 3 Encounters:  11/23/19 288 lb 9.6 oz (130.9 kg)  09/03/19 280 lb (127 kg)  08/10/19 278 lb (126.1 kg)    EKG tracing ordered today is personally reviewed and shows sinus with RsR'  Assessment and Plan:  1. Persistent afib Well controlled post ablation off Amiodarone chads2vasc score is 4 Continue eliquis  2. HTN Stable No change required today  3. CAD No ischemic symptoms  4. Obesity Body mass index is 32.51 kg/m. Lifestyle modification is advised  5. Thoracic aneurysm  Followed by Dr 10/10/19  Risks, benefits and potential toxicities for medications prescribed  and/or refilled reviewed with patient today.   Tyrone Sage MD, [facility] 11/23/2019 10:30 AM

## 2020-01-04 ENCOUNTER — Other Ambulatory Visit: Payer: Self-pay

## 2020-01-04 ENCOUNTER — Ambulatory Visit: Payer: Medicare PPO | Admitting: Cardiovascular Disease

## 2020-01-04 ENCOUNTER — Encounter: Payer: Self-pay | Admitting: Cardiovascular Disease

## 2020-01-04 VITALS — BP 130/80 | HR 86 | Ht 79.0 in | Wt 289.6 lb

## 2020-01-04 DIAGNOSIS — I712 Thoracic aortic aneurysm, without rupture, unspecified: Secondary | ICD-10-CM

## 2020-01-04 DIAGNOSIS — I2510 Atherosclerotic heart disease of native coronary artery without angina pectoris: Secondary | ICD-10-CM | POA: Diagnosis not present

## 2020-01-04 DIAGNOSIS — I10 Essential (primary) hypertension: Secondary | ICD-10-CM

## 2020-01-04 DIAGNOSIS — E7800 Pure hypercholesterolemia, unspecified: Secondary | ICD-10-CM

## 2020-01-04 DIAGNOSIS — I351 Nonrheumatic aortic (valve) insufficiency: Secondary | ICD-10-CM

## 2020-01-04 DIAGNOSIS — I4819 Other persistent atrial fibrillation: Secondary | ICD-10-CM

## 2020-01-04 NOTE — Progress Notes (Signed)
Chief Complaint  Patient presents with  . Follow-up    CAD   History of Present Illness: 79 yo male with h/o CAD s/p stent RCA 2004, HTN, HLD, thoracic aortic aneurysm and persistent atrial fibrillation here today for cardiac follow up. He has a thoracic aortic aneurysm and is followed yearly by Dr. Tyrone Sage with yearly chest CT scans. He is known to have CAD and had a stent placed in the RCA in 2004. Last cardiac cath in 2013 with mild disease in the LAD and Circumflex, patent stent proximal RCA and moderate disease in the small PDA. He has not tolerated statins due to muscle aches or beta blockers secondary to bradycardia. Nuclear stress test 08/16/15 with no ischemia. Echo May 2017 with normal LV systolic function, mild to moderate AI. New onset atrial fibrillation November 2020. He was started on Eliquis. Echo November 2020 with LVEF=50-55%, mild AI. He was cardioverted to sinus 03/13/19 but converted back to atrial fib. He was started on amiodarone and had repeat cardioversion 04/15/19 but did not maintain sinus rhythm. He is now followed in the atrial fibrillation clinic by Dr. Johney Frame and is post AF ablation in February 2021. Cardioversion in March 2021.   He is here today for follow up. The patient denies any chest pain, dyspnea, palpitations, lower extremity edema, orthopnea, PND, dizziness, near syncope or syncope. He is feeling great.     Primary Care Physician: [name], MD  Past Medical History:  Diagnosis Date  . Arthritis   . Coronary artery disease    Stent 2004  . GERD (gastroesophageal reflux disease)   . History of echocardiogram    Echo 5/17: mild LVH, EF 50-55%, no RWMA, Gr 2 DD, mild to mod AI, PASP 32 mmHg  . History of nuclear stress test    Myoview 5/17 - EF 48%, no scar, no ischemia. Low Risk  . History of pneumonia    79 years old  . Hx of Bell's palsy    left face  . Hypercholesteremia    managed by dr Waynard Edwards  . Hypertension   . Ischemic heart disease    He  had a stent in his right coronary artery in 2004; He does have a dual ostium of the left coronary system.   . Lung nodules    noted on past CT scans  . Malaria   . SVT (supraventricular tachycardia) (HCC)    remote episode during exercise test  . Thoracic aortic aneurysm [facility])    last scan in Jan 2013. Now measuring 5.0cm. Referred to Dr. Tyrone Sage  . Wears dentures    bottom  . Wears glasses     Past Surgical History:  Procedure Laterality Date  . ATRIAL FIBRILLATION ABLATION N/A 05/12/2019   Procedure: ATRIAL FIBRILLATION ABLATION;  Surgeon: Hillis Range, MD;  [facility]: [facility]VAS[facility] CV LAB;  Service: Cardiovascular;  Laterality: N/A;  . CARDIOVERSION N/A 03/13/2019   Procedure: CARDIOVERSION;  Surgeon: Pricilla Riffle, MD;  Location: [facility] ENDOSCOPY;  Service: Cardiovascular;  Laterality: N/A;  . CARDIOVERSION N/A 04/15/2019   Procedure: CARDIOVERSION;  Surgeon: Vesta Mixer, MD;  Location: [facility] ENDOSCOPY;  Service: Cardiovascular;  Laterality: N/A;  . CARDIOVERSION N/A 06/16/2019   Procedure: CARDIOVERSION;  Surgeon: Chilton Si, MD;  Location: [facility] ENDOSCOPY;  Service: Cardiovascular;  Laterality: N/A;  . CERVICAL FUSION  03-2010   c2-c7  . COLONOSCOPY     x4  . CORONARY STENT PLACEMENT  2004  . dental implant  x 2  . ELBOW SURGERY  1997   lt and rt  . EYE SURGERY    . HIP PINNING,CANNULATED Right 05/11/2014   Procedure: CANNULATED HIP PINNING;  Surgeon: Sheral Apley, MD;  Location: [facility] OR;  Service: Orthopedics;  Laterality: Right;  . RETINAL DETACHMENT SURGERY  1975   lt  . rotator cuff surgery  1997   lt  . SHOULDER ARTHROSCOPY  2/15   right-got cardiac clearance-gsc  . TONSILLECTOMY    . TRIGGER FINGER RELEASE Left 10/12/2013   Procedure: LEFT LONG FINGER TRIGGER RELEASE;  Surgeon: Jodi Marble, MD;  Location: [facility];  Service: Orthopedics;  Laterality: Left;    Current Outpatient Medications  Medication Sig Dispense Refill  . apixaban  (ELIQUIS) 5 MG TABS tablet Take 1 tablet (5 mg total) by mouth 2 (two) times daily. 60 tablet 5  . chlorthalidone (HYGROTON) 25 MG tablet Take 25 mg by mouth daily.     . clobetasol ointment (TEMOVATE) 0.05 % Apply 1 application topically 2 (two) times daily as needed (itchy scaly on legs).     . Coenzyme Q10 (COQ-10) 200 MG CAPS Take 200 mg by mouth daily.     . colesevelam (WELCHOL) 625 MG tablet Take 3,750 mg by mouth daily after supper. 6 tablets    . ezetimibe (ZETIA) 10 MG tablet Take 5 mg by mouth every evening.     . fluticasone (FLONASE) 50 MCG/ACT nasal spray Place 1 spray into both nostrils daily as needed for allergies or rhinitis.    . Glucosamine HCl 1500 MG TABS Take 3,000 mg by mouth daily.    . hydrocortisone 2.5 % cream Apply 1 application topically as needed (eye brows flakey). Use on head    . Ivermectin (SOOLANTRA EX) Apply 1 application topically daily as needed (Rosacea).     Marland Kitchen ketoconazole (NIZORAL) 2 % cream Apply 1 application topically daily as needed for irritation (Corner of mouth).     . lisinopril (PRINIVIL,ZESTRIL) 40 MG tablet Take 40 mg by mouth every evening.     Marland Kitchen LIVALO 4 MG TABS Take 2 mg by mouth at bedtime.     . Multiple Vitamin (MULTIVITAMIN WITH MINERALS) TABS tablet Take 1 tablet by mouth daily.     . mupirocin ointment (BACTROBAN) 2 % Place 1 application into the nose daily as needed (Tic bits).    . nitroGLYCERIN (NITROSTAT) 0.4 MG SL tablet Place 1 tablet (0.4 mg total) under the tongue every 5 (five) minutes as needed for chest pain. 25 tablet 1  . omeprazole (PRILOSEC) 20 MG capsule Take 20 mg by mouth daily.      No current facility-administered medications for this visit.    Allergies  Allergen Reactions  . Quinolones Hypertension    Patient was warned about not using Cipro and similar antibiotics. Recent studies have raised concern that fluoroquinolone antibiotics could be associated with an increased risk of aortic  aneurysm Fluoroquinolones have non-antimicrobial properties that might jeopardise the integrity of the extracellular matrix of the vascular wall In a  propensity score matched cohort study in [place], there was a 66% increased rate of aortic aneurysm or dissection associated with oral fluoroquinolone use, compared wit  . Promethazine Hcl Other (See Comments)    Agitation and incoherence (phenergan)  . Vesicare [Solifenacin Succinate] Itching and Rash    Social History   Socioeconomic History  . Marital status: Married    Spouse name: Not on file  . Number  of children: 1  . Years of education: Not on file  . Highest education level: Not on file  Occupational History  . Occupation: retired Advertising account executive: RETIRED  Tobacco Use  . Smoking status: Former Smoker    Quit date: 04/09/1969    Years since quitting: 50.7  . Smokeless tobacco: Current User    Types: Chew  . Tobacco comment: reports that when he smoked it was intermittent  Vaping Use  . Vaping Use: Unknown  Substance and Sexual Activity  . Alcohol use: Yes    Alcohol/week: 1.0 standard drink    Types: 1 Standard drinks or equivalent per week    Comment: occasional mainly during holiday  . Drug use: No  . Sexual activity: Yes  Other Topics Concern  . 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 Difficulty of Paying Living Expenses: Not on file  Food Insecurity:   . Worried About Programme researcher, broadcasting/film/video in the Last Year: Not on file  . Ran Out of Food in the Last Year: Not on file  Transportation Needs:   . Lack of Transportation (Medical): Not on file  . Lack of Transportation (Non-Medical): Not on file  Physical Activity:   . Days of Exercise per Week: Not on file  . Minutes of Exercise per Session: Not on file  Stress:   . Feeling of  Stress : Not on file  Social Connections:   . Frequency of Communication with Friends and Family: Not on file  . Frequency of Social Gatherings with Friends and Family: Not on file  . Attends Religious Services: Not on file  . Active Member of Clubs or Organizations: Not on file  . Attends Banker Meetings: Not on file  . Marital Status: Not on file  Intimate Partner Violence:   . Fear of Current or Ex-Partner: Not on file  . Emotionally Abused: Not on file  . Physically Abused: Not on file  . Sexually Abused: Not on file    Family History  Problem Relation Age of Onset  . Heart disease Father   . Heart attack Father        63  . Cancer Mother 4       LUNG  . Hypertension Neg Hx   . Stroke Neg Hx     Review of Systems:  As stated in the HPI and otherwise negative.   BP 130/80   Pulse 86   Ht 6\' 7"  (2.007 m)   Wt 289 lb 9.6 oz (131.4 kg)   SpO2 99%   BMI 32.62 kg/m   Physical Examination:  General: Well developed, well nourished, NAD  HEENT: OP clear, mucus membranes moist  SKIN: warm, dry. No rashes. Neuro: No focal deficits  Musculoskeletal: Muscle strength 5/5 all ext  Psychiatric: Mood and affect normal  Neck: No JVD, no carotid bruits, no thyromegaly, no lymphadenopathy.  Lungs:Clear bilaterally, no wheezes, rhonci, crackles Cardiovascular: Regular rate and rhythm. No murmurs, gallops or rubs. Abdomen:Soft. Bowel sounds present. Non-tender.  Extremities: No lower extremity edema. Pulses are 2 + in the bilateral DP/PT.  Cardiac cath 06/08/11: Left main: There are separate ostia for the LAD and Circumflex.  Left Anterior Descending Artery: Large vessel that courses to the apex. No obstructive disease noted.Small  diagonal branch.  Circumflex Artery: Large caliber vessel with no obstructive disease noted.  Right Coronary Artery: Very large, dominant vessel with patent proximal stent. There is mild plaque disease in the mid and distal RCA. The small  caliber PDA has 60% stenosis.  Left Ventricular Angiogram: Dilated LV. LVEF=50%.  Aortic root: Dilated aortic root and ascending aorta.  Impression:  1. 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 Preserved LV systolic function  3. 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   Echo 02/26/19:  1. Left ventricular ejection fraction, by visual estimation, is 50 to  55%. The left ventricle has normal function. There is moderately increased  left ventricular hypertrophy.  2. Definity contrast agent was given IV to delineate the left ventricular  endocardial borders.  3. Left ventricular diastolic function could not be evaluated.  4. Global right ventricle has normal systolic function.The right  ventricular size is normal.  5. Left atrial size was normal.  6. Right atrial size was normal.  7. The mitral valve is normal in structure. Trace mitral valve  regurgitation. No evidence of mitral stenosis.  8. The tricuspid valve is normal in structure. Tricuspid valve  regurgitation is trivial.  9. The aortic valve is tricuspid. Aortic valve regurgitation is mild.  Mild aortic valve sclerosis without stenosis.  10. The pulmonic valve was not well visualized. Pulmonic valve  regurgitation is not visualized.  11. Aortic dilatation noted.  12. There is moderate to severe dilatation of the aortic root and of the  ascending aorta measuring 52 mm.  13. The inferior vena cava is normal in size with greater than 50%  respiratory variability, suggesting right atrial pressure of 3 mmHg.  14. Definity used; normal LV systolic function; moderate LVH; moderate to  severe dilatation of aortic root/ascending aorta (5.2 cm; suggest CTA or  MRA to further assess); mild AI.   EKG:  EKG is not ordered today. The ekg ordered today demonstrates   Recent Labs: 06/09/2019: ALT 26; BUN 30; Creatinine, Ser 1.32; Hemoglobin 15.2; Platelets 205; Potassium 4.5; Sodium 141; TSH 4.472   Lipid  Panel Followed in primary care   Wt Readings from Last 3 Encounters:  01/04/20 289 lb 9.6 oz (131.4 kg)  11/23/19 288 lb 9.6 oz (130.9 kg)  09/03/19 280 lb (127 kg)     Other studies Reviewed: Additional studies/ records that were reviewed today include: . Review of the above records demonstrates:    Assessment and Plan:   1. CAD without angina: He has no chest pain. Last cath in 2013 with patent stent RCA, mild disease in the LAD and Circumflex. Stress test 2017 with no ischemia. Will continue ASA and statin. No beta blocker due to bradycardia.    2. Thoracic Aortic aneurysm: Followed in the CT surgery clinic by Dr. Tyrone Sage.   3. HTN: BP is controlled. NO changes  4. HLD: Lipids followed in primary care and well controlled. LDL 61. Continue statin and Zetia.   5. Aortic Valve disease: Mild AI by echo in November 2020. Plan to repeat echo in November 2023  6. Persistent atrial fibrillation: Sinus today. Followed in atrial fib clinic. Continue Eliquis.   Current medicines are reviewed at length with the patient today.  The patient does not have concerns regarding medicines.  The following changes have been made:  no change  Labs/ tests ordered today include:   No orders of the defined types were placed in this encounter.   Disposition:  FU with me in 12 months  Signed, Verne Carrowhristopher Zoee Heeney, MD 01/04/2020 9:21 AM    [facility] [address], [place], [place]  [address] Phone: [phone]; Fax: [phone]

## 2020-01-04 NOTE — Patient Instructions (Signed)

## 2020-02-24 ENCOUNTER — Encounter: Payer: Self-pay | Admitting: Internal Medicine

## 2020-02-24 ENCOUNTER — Other Ambulatory Visit: Payer: Self-pay

## 2020-02-24 ENCOUNTER — Ambulatory Visit: Payer: Medicare PPO | Admitting: Internal Medicine

## 2020-02-24 VITALS — BP 118/72 | HR 73 | Ht 79.0 in | Wt 292.0 lb

## 2020-02-24 DIAGNOSIS — I10 Essential (primary) hypertension: Secondary | ICD-10-CM | POA: Diagnosis not present

## 2020-02-24 DIAGNOSIS — I2510 Atherosclerotic heart disease of native coronary artery without angina pectoris: Secondary | ICD-10-CM

## 2020-02-24 DIAGNOSIS — I4819 Other persistent atrial fibrillation: Secondary | ICD-10-CM

## 2020-02-24 NOTE — Patient Instructions (Signed)
Medication Instructions:  Your physician recommends that you continue on your current medications as directed. Please refer to the Current Medication list given to you today.  *If you need a refill on your cardiac medications before your next appointment, please call your pharmacy*  Lab Work: None ordered.  If you have labs (blood work) drawn today and your tests are completely normal, you will receive your results only by: . MyChart Message (if you have MyChart) OR . A paper copy in the mail If you have any lab test that is abnormal or we need to change your treatment, we will call you to review the results.  Testing/Procedures: None ordered.  Follow-Up: At [facility], you and your health needs are our priority.  As part of our continuing mission to provide you with exceptional heart care, we have created designated Provider Care Teams.  These Care Teams include your primary Cardiologist (physician) and Advanced Practice Providers (APPs -  Physician Assistants and Nurse Practitioners) who all work together to provide you with the care you need, when you need it.  We recommend signing up for the patient portal called "MyChart".  Sign up information is provided on this After Visit Summary.  MyChart is used to connect with patients for Virtual Visits (Telemedicine).  Patients are able to view lab/test results, encounter notes, upcoming appointments, etc.  Non-urgent messages can be sent to your provider as well.   To learn more about what you can do with MyChart, go to [email or web address].    Your next appointment:   Your physician wants you to follow-up in: 6 months with Dr. Allred. You will receive a reminder letter in the mail two months in advance. If you don't receive a letter, please call our office to schedule the follow-up appointment.    Other Instructions:  

## 2020-02-24 NOTE — Progress Notes (Signed)
PCP: [name], Ronald [name] Primary Cardiologist: Dr [name] Primary EP: Dr Ronald [name] is a 79 y.o. male who presents today for routine electrophysiology followup.  Since last being seen in our clinic, the patient reports doing very well.  Today, he denies symptoms of palpitations, chest pain, shortness of breath,  lower extremity edema, dizziness, presyncope, or syncope.  The patient is otherwise without complaint today.   Past Medical History:  Diagnosis Date  . Arthritis   . Coronary artery disease    Stent 2004  . GERD (gastroesophageal reflux disease)   . History of echocardiogram    Echo 5/17: mild LVH, EF 50-55%, no RWMA, Gr 2 DD, mild to mod AI, PASP 32 mmHg  . History of nuclear stress test    Myoview 5/17 - EF 48%, no scar, no ischemia. Low Risk  . History of pneumonia    79 years old  . Hx of Bell's palsy    left face  . Hypercholesteremia    managed by dr Waynard Edwards  . Hypertension   . 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   . Lung nodules    noted on past CT scans  . Malaria   . SVT (supraventricular tachycardia) (HCC)    remote episode during exercise test  . Thoracic aortic aneurysm [facility])    last scan in Jan 2013. Now measuring 5.0cm. Referred to Dr. [name]  . Wears dentures    bottom  . Wears glasses    Past Surgical History:  Procedure Laterality Date  . ATRIAL FIBRILLATION ABLATION N/A 05/12/2019   Procedure: ATRIAL FIBRILLATION ABLATION;  Surgeon: Hillis Range, Ronald [name];  [facility];  Service: Cardiovascular;  Laterality: N/A;  . CARDIOVERSION N/A 03/13/2019   Procedure: CARDIOVERSION;  Surgeon: Pricilla Riffle, Ronald [name];  Location: [facility] ENDOSCOPY;  Service: Cardiovascular;  Laterality: N/A;  . CARDIOVERSION N/A 04/15/2019   Procedure: CARDIOVERSION;  Surgeon: Vesta Mixer, Ronald [name];  Location: [facility] ENDOSCOPY;  Service: Cardiovascular;  Laterality: N/A;   . CARDIOVERSION N/A 06/16/2019   Procedure: CARDIOVERSION;  Surgeon: Chilton Si, Ronald [name];  Location: [facility] ENDOSCOPY;  Service: Cardiovascular;  Laterality: N/A;  . CERVICAL FUSION  03-2010   c2-c7  . COLONOSCOPY     x4  . CORONARY STENT PLACEMENT  2004  . dental implant     x 2  . ELBOW SURGERY  1997   lt and rt  . EYE SURGERY    . HIP PINNING,CANNULATED Right 05/11/2014   Procedure: CANNULATED HIP PINNING;  Surgeon: Sheral Apley, Ronald [name];  Location: [facility] OR;  Service: Orthopedics;  Laterality: Right;  . RETINAL DETACHMENT SURGERY  1975   lt  . rotator cuff surgery  1997   lt  . SHOULDER ARTHROSCOPY  2/15   right-got cardiac clearance-gsc  . TONSILLECTOMY    . TRIGGER FINGER RELEASE Left 10/12/2013   Procedure: LEFT LONG FINGER TRIGGER RELEASE;  Surgeon: Jodi Marble, Ronald [name];  Location: [facility];  Service: Orthopedics;  Laterality: Left;    ROS- all systems are reviewed and negatives except as per HPI above  Current Outpatient Medications  Medication Sig Dispense Refill  . apixaban (ELIQUIS) 5 MG TABS tablet Take 1 tablet (5 mg total) by mouth 2 (two) times daily. 60 tablet 5  . chlorthalidone (HYGROTON) 25 MG tablet Take 25 mg by mouth daily.     Marland Kitchen  clobetasol ointment (TEMOVATE) 0.05 % Apply 1 application topically 2 (two) times daily as needed (itchy scaly on legs).     . Coenzyme Q10 (COQ-10) 200 MG CAPS Take 200 mg by mouth daily.     . colesevelam (WELCHOL) 625 MG tablet Take 3,750 mg by mouth daily after supper. 6 tablets    . ezetimibe (ZETIA) 10 MG tablet Take 5 mg by mouth every evening.     . fluticasone (FLONASE) 50 MCG/ACT nasal spray Place 1 spray into both nostrils daily as needed for allergies or rhinitis.    . Glucosamine HCl 1500 MG TABS Take 3,000 mg by mouth daily.    . hydrocortisone 2.5 % cream Apply 1 application topically as needed (eye brows flakey). Use on head    . Ivermectin (SOOLANTRA EX) Apply 1 application topically daily as needed  (Rosacea).     Marland Kitchen ketoconazole (NIZORAL) 2 % cream Apply 1 application topically daily as needed for irritation (Corner of mouth).     . lisinopril (PRINIVIL,ZESTRIL) 40 MG tablet Take 40 mg by mouth every evening.     Marland Kitchen LIVALO 4 MG TABS Take 2 mg by mouth at bedtime.     . Multiple Vitamin (MULTIVITAMIN WITH MINERALS) TABS tablet Take 1 tablet by mouth daily.     . mupirocin ointment (BACTROBAN) 2 % Place 1 application into the nose daily as needed (Tic bits).    . nitroGLYCERIN (NITROSTAT) 0.4 MG SL tablet Place 1 tablet (0.4 mg total) under the tongue every 5 (five) minutes as needed for chest pain. 25 tablet 1  . omeprazole (PRILOSEC) 20 MG capsule Take 20 mg by mouth daily.      No current facility-administered medications for this visit.    Physical Exam: Vitals:   02/24/20 1137  BP: 118/72  Pulse: 73  SpO2: 98%  Weight: 292 lb (132.5 kg)  Height: 6\' 7"  (2.007 m)    GEN- The patient is well appearing, alert and oriented x 3 today.   Head- normocephalic, atraumatic Eyes-  Sclera clear, conjunctiva pink Ears- hearing intact Oropharynx- clear Lungs- Clear to ausculation bilaterally, normal work of breathing Heart- Regular rate and rhythm, no murmurs, rubs or gallops, PMI not laterally displaced GI- soft, NT, ND, + BS Extremities- no clubbing, cyanosis, or edema  Wt Readings from Last 3 Encounters:  02/24/20 292 lb (132.5 kg)  01/04/20 289 lb 9.6 oz (131.4 kg)  11/23/19 288 lb 9.6 oz (130.9 kg)    EKG tracing ordered today is personally reviewed and shows sinus with LAD  Assessment and Plan:  1. Persistent afib Well controlled post ablation off AAD therapy chads2vasc score is 4.  Continue eliquis  2. HTN Stable No change required today  3. CAD No ischemic symptoms  4. Thoracic aneurysm Followed by Dr 11/25/19  5. Obesity Body mass index is 32.9 kg/m. Lifestyle modification again advised  6. HL Continue statin and zetia  Risks, benefits and potential  toxicities for medications prescribed and/or refilled reviewed with patient today.   Return in 6 months  Ronald Sage Ronald [name], [facility] 02/24/2020 11:41 AM

## 2020-04-11 ENCOUNTER — Other Ambulatory Visit: Payer: Self-pay | Admitting: Cardiovascular Disease

## 2020-05-18 ENCOUNTER — Telehealth: Payer: Self-pay | Admitting: Internal Medicine

## 2020-05-18 DIAGNOSIS — I2510 Atherosclerotic heart disease of native coronary artery without angina pectoris: Secondary | ICD-10-CM | POA: Diagnosis not present

## 2020-05-18 DIAGNOSIS — I10 Essential (primary) hypertension: Secondary | ICD-10-CM | POA: Diagnosis not present

## 2020-05-18 DIAGNOSIS — I4891 Unspecified atrial fibrillation: Secondary | ICD-10-CM | POA: Diagnosis not present

## 2020-05-18 DIAGNOSIS — E118 Type 2 diabetes mellitus with unspecified complications: Secondary | ICD-10-CM | POA: Diagnosis not present

## 2020-05-18 DIAGNOSIS — D6869 Other thrombophilia: Secondary | ICD-10-CM | POA: Diagnosis not present

## 2020-05-18 DIAGNOSIS — I872 Venous insufficiency (chronic) (peripheral): Secondary | ICD-10-CM | POA: Diagnosis not present

## 2020-05-18 DIAGNOSIS — E785 Hyperlipidemia, unspecified: Secondary | ICD-10-CM | POA: Diagnosis not present

## 2020-05-18 DIAGNOSIS — K219 Gastro-esophageal reflux disease without esophagitis: Secondary | ICD-10-CM | POA: Diagnosis not present

## 2020-05-18 DIAGNOSIS — M5136 Other intervertebral disc degeneration, lumbar region: Secondary | ICD-10-CM | POA: Diagnosis not present

## 2020-05-18 NOTE — Telephone Encounter (Signed)
Shortness of breath started on Sunday. Was seen at PCP today and they told him he was back in atrial fibrillation. Blood Pressure 119/81 Heart rate 89. Patient has not missed any doses of his Eliquis. Advised patient I will forward to Dr. Johney Frame for advisement.  Gave ED precautions. Patient verbalized understanding.

## 2020-05-18 NOTE — Telephone Encounter (Signed)
Patient c/o Palpitations:  High priority if patient c/o lightheadedness, shortness of breath, or chest pain  1) How long have you had palpitations/irregular HR/ Afib? Are you having the symptoms now? Went to see his PCP today and he was told that he was in AFIB.  2) Are you currently experiencing lightheadedness, SOB or CP? SOB  3) Do you have a history of afib (atrial fibrillation) or irregular heart rhythm? Yes.  4) Have you checked your BP or HR? (document readings if available): 102/65 HR 75  5) Are you experiencing any other symptoms? Patient states that he is having SOB and has been low on energy. Please advise.

## 2020-05-19 NOTE — Telephone Encounter (Signed)
MD Allred would like the patient to be seen in the Afib Clinic. Appointment made for tomorrow at 10 am with [facility].  Patient agreeable and verbalized understanding.

## 2020-05-20 ENCOUNTER — Ambulatory Visit (HOSPITAL_COMMUNITY)
Admission: RE | Admit: 2020-05-20 | Discharge: 2020-05-20 | Disposition: A | Discharge status: Home / Self Care | Payer: Medicare PPO | Source: Ambulatory Visit | Attending: Physician Assistant | Admitting: Physician Assistant

## 2020-05-20 ENCOUNTER — Encounter (HOSPITAL_COMMUNITY): Payer: Self-pay | Admitting: Physician Assistant

## 2020-05-20 ENCOUNTER — Other Ambulatory Visit (HOSPITAL_COMMUNITY): Payer: Self-pay | Admitting: Physician Assistant

## 2020-05-20 ENCOUNTER — Other Ambulatory Visit: Payer: Self-pay

## 2020-05-20 VITALS — BP 116/82 | HR 106 | Ht 79.0 in | Wt 287.8 lb

## 2020-05-20 DIAGNOSIS — I2510 Atherosclerotic heart disease of native coronary artery without angina pectoris: Secondary | ICD-10-CM | POA: Insufficient documentation

## 2020-05-20 DIAGNOSIS — I4819 Other persistent atrial fibrillation: Secondary | ICD-10-CM | POA: Diagnosis not present

## 2020-05-20 DIAGNOSIS — D6869 Other thrombophilia: Secondary | ICD-10-CM

## 2020-05-20 DIAGNOSIS — E669 Obesity, unspecified: Secondary | ICD-10-CM | POA: Diagnosis not present

## 2020-05-20 DIAGNOSIS — Z6832 Body mass index (BMI) 32.0-32.9, adult: Secondary | ICD-10-CM | POA: Insufficient documentation

## 2020-05-20 DIAGNOSIS — I4892 Unspecified atrial flutter: Secondary | ICD-10-CM | POA: Diagnosis not present

## 2020-05-20 DIAGNOSIS — Z888 Allergy status to other drugs, medicaments and biological substances status: Secondary | ICD-10-CM | POA: Insufficient documentation

## 2020-05-20 DIAGNOSIS — I484 Atypical atrial flutter: Secondary | ICD-10-CM | POA: Insufficient documentation

## 2020-05-20 DIAGNOSIS — Z8249 Family history of ischemic heart disease and other diseases of the circulatory system: Secondary | ICD-10-CM | POA: Insufficient documentation

## 2020-05-20 DIAGNOSIS — Z7901 Long term (current) use of anticoagulants: Secondary | ICD-10-CM | POA: Insufficient documentation

## 2020-05-20 DIAGNOSIS — I10 Essential (primary) hypertension: Secondary | ICD-10-CM | POA: Insufficient documentation

## 2020-05-20 DIAGNOSIS — Z87891 Personal history of nicotine dependence: Secondary | ICD-10-CM | POA: Diagnosis not present

## 2020-05-20 DIAGNOSIS — Z955 Presence of coronary angioplasty implant and graft: Secondary | ICD-10-CM | POA: Insufficient documentation

## 2020-05-20 DIAGNOSIS — E785 Hyperlipidemia, unspecified: Secondary | ICD-10-CM | POA: Diagnosis not present

## 2020-05-20 DIAGNOSIS — I712 Thoracic aortic aneurysm, without rupture: Secondary | ICD-10-CM | POA: Diagnosis not present

## 2020-05-20 DIAGNOSIS — Z79899 Other long term (current) drug therapy: Secondary | ICD-10-CM | POA: Insufficient documentation

## 2020-05-20 LAB — BASIC METABOLIC PANEL WITH GFR
Anion gap: 11 (ref 5–15)
BUN: 21 mg/dL (ref 8–23)
CO2: 24 mmol/L (ref 22–32)
Calcium: 8.9 mg/dL (ref 8.9–10.3)
Chloride: 106 mmol/L (ref 98–111)
Creatinine, Ser: 1.19 mg/dL (ref 0.61–1.24)
GFR, Estimated: >60 mL/min
Glucose, Bld: 193 mg/dL — ABNORMAL HIGH (ref 70–99)
Potassium: 4.5 mmol/L (ref 3.5–5.1)
Sodium: 141 mmol/L (ref 135–145)

## 2020-05-20 LAB — CBC
HCT: 48 % (ref 39.0–52.0)
Hemoglobin: 15.7 g/dL (ref 13.0–17.0)
MCH: 31.3 pg (ref 26.0–34.0)
MCHC: 32.7 g/dL (ref 30.0–36.0)
MCV: 95.6 fL (ref 80.0–100.0)
Platelets: 244 K/uL (ref 150–400)
RBC: 5.02 MIL/uL (ref 4.22–5.81)
RDW: 12.5 % (ref 11.5–15.5)
WBC: 7.6 K/uL (ref 4.0–10.5)
nRBC: 0 % (ref 0.0–0.2)

## 2020-05-20 NOTE — Patient Instructions (Signed)
Cardioversion scheduled for 05/25/2020 @ 12:00pm.  - Arrive at the [facility] and go to admitting at 11:00am  - Do not eat or drink anything after midnight the night prior to your procedure.  - Take all your morning medication (except diabetic medications) with a sip of water prior to arrival.  - You will not be able to drive home after your procedure.  - Do NOT miss any doses of your blood thinner - if you should miss a dose please notify our office immediately.  - If you feel as if you go back into normal rhythm prior to scheduled cardioversion, please notify our office immediately. If your procedure is canceled in the cardioversion suite you will be charged a cancellation fee.

## 2020-05-20 NOTE — Progress Notes (Signed)
Primary Care Physician: [name], MD Primary Cardiologist: Dr Clifton Ferron Primary Electrophysiologist: Dr Johney Frame Referring Physician: Dr Herbert Pun is a 80 y.o. male with a history of CAD s/p stent RCA 2004, HTN, HLD, thoracic aortic aneurysm(followed by Dr. Tyrone Sage), and persistent atrial fibrillation who presents for follow up in the [facility].  The patient was initially diagnosed with atrial fibrillation on 02/17/19 after presenting with symptoms of SOB and was found to be in rate controlled afib. He was started on Eliquis for a CHADS2VASC score of 4. He underwent DCCV on 03/13/19. He reports that he had some minimal improvement in his symptoms of dyspnea but it is still persistent. He denies significant snoring or alcohol use. Patient is s/p DCCV on 04/15/19. Patient reports that he was in SR for 4 days with no symptoms of SOB and greatly increased energy level. Unfortunately, he had return of his afib. Patient is s/p afib ablation with Dr Johney Frame on 05/12/19 with a DCCV on 06/09/19.   On follow up today, patient reports that for the last several days he has "not felt right" and been much more fatigued. He has also noticed his heart rate is slowly increasing. There were no specific triggers that he could identify. He denies any bleeding issues on anticoagulation.   Today, he denies symptoms of palpitations, chest pain, orthopnea, PND, lower extremity edema, dizziness, presyncope, syncope, snoring, daytime somnolence, bleeding, or neurologic sequela. The patient is tolerating medications without difficulties and is otherwise without complaint today.    Atrial Fibrillation Risk Factors:  he does not have symptoms or diagnosis of sleep apnea. he does not have a history of rheumatic fever. he does not have a history of alcohol use. The patient does have a history of early familial atrial fibrillation or other arrhythmias. Mother had afib.  he has a  BMI of Body mass index is 32.42 kg/m.Marland Kitchen Filed Weights   05/20/20 1005  Weight: 130.5 kg    Family History  Problem Relation Age of Onset  . Heart disease Father   . Heart attack Father        65  . Cancer Mother 55       LUNG  . Hypertension Neg Hx   . Stroke Neg Hx      Atrial Fibrillation Management history:  Previous antiarrhythmic drugs: amiodarone Previous cardioversions: 03/13/19, 04/15/19, 06/09/19 Previous ablations: 05/12/19 CHADS2VASC score: 4 Anticoagulation history: Eliquis   Past Medical History:  Diagnosis Date  . Arthritis   . Coronary artery disease    Stent 2004  . GERD (gastroesophageal reflux disease)   . History of echocardiogram    Echo 5/17: mild LVH, EF 50-55%, no RWMA, Gr 2 DD, mild to mod AI, PASP 32 mmHg  . History of nuclear stress test    Myoview 5/17 - EF 48%, no scar, no ischemia. Low Risk  . History of pneumonia    80 years old  . Hx of Bell's palsy    left face  . Hypercholesteremia    managed by dr Waynard Edwards  . Hypertension   . 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   . Lung nodules    noted on past CT scans  . Malaria   . SVT (supraventricular tachycardia) (HCC)    remote episode during exercise test  . Thoracic aortic  aneurysm [facility])    last scan in Jan 2013. Now measuring 5.0cm. Referred to Dr. Tyrone Sage  . Wears dentures    bottom  . Wears glasses    Past Surgical History:  Procedure Laterality Date  . ATRIAL FIBRILLATION ABLATION N/A 05/12/2019   Procedure: ATRIAL FIBRILLATION ABLATION;  Surgeon: Hillis Range, MD;  [facility]: [facility] INVAS[facility] CV LAB;  Service: Cardiovascular;  Laterality: N/A;  . CARDIOVERSION N/A 03/13/2019   Procedure: CARDIOVERSION;  Surgeon: Pricilla Riffle, MD;  Location: [facility] ENDOSCOPY;  Service: Cardiovascular;  Laterality: N/A;  . CARDIOVERSION N/A 04/15/2019   Procedure: CARDIOVERSION;  Surgeon: Vesta Mixer, MD;   Location: [facility] ENDOSCOPY;  Service: Cardiovascular;  Laterality: N/A;  . CARDIOVERSION N/A 06/16/2019   Procedure: CARDIOVERSION;  Surgeon: Chilton Si, MD;  Location: [facility] ENDOSCOPY;  Service: Cardiovascular;  Laterality: N/A;  . CERVICAL FUSION  03-2010   c2-c7  . COLONOSCOPY     x4  . CORONARY STENT PLACEMENT  2004  . dental implant     x 2  . ELBOW SURGERY  1997   lt and rt  . EYE SURGERY    . HIP PINNING,CANNULATED Right 05/11/2014   Procedure: CANNULATED HIP PINNING;  Surgeon: Sheral Apley, MD;  Location: [facility] OR;  Service: Orthopedics;  Laterality: Right;  . RETINAL DETACHMENT SURGERY  1975   lt  . rotator cuff surgery  1997   lt  . SHOULDER ARTHROSCOPY  2/15   right-got cardiac clearance-gsc  . TONSILLECTOMY    . TRIGGER FINGER RELEASE Left 10/12/2013   Procedure: LEFT LONG FINGER TRIGGER RELEASE;  Surgeon: Jodi Marble, MD;  Location: [facility];  Service: Orthopedics;  Laterality: Left;    Current Outpatient Medications  Medication Sig Dispense Refill  . apixaban (ELIQUIS) 5 MG TABS tablet Take 1 tablet (5 mg total) by mouth 2 (two) times daily. 60 tablet 8  . chlorthalidone (HYGROTON) 25 MG tablet Take 25 mg by mouth daily.     . clobetasol ointment (TEMOVATE) 0.05 % Apply 1 application topically 2 (two) times daily as needed (itchy scaly on legs).     . Coenzyme Q10 (COQ-10) 200 MG CAPS Take 200 mg by mouth daily.     . colesevelam (WELCHOL) 625 MG tablet Take 3,750 mg by mouth daily after supper. 6 tablets    . ezetimibe (ZETIA) 10 MG tablet Take 5 mg by mouth every evening.     . fluticasone (FLONASE) 50 MCG/ACT nasal spray Place 1 spray into both nostrils daily as needed for allergies or rhinitis.    . Glucosamine HCl 1500 MG TABS Take 3,000 mg by mouth daily.    . hydrocortisone 2.5 % cream Apply 1 application topically as needed (eye brows flakey). Use on head    . Ivermectin (SOOLANTRA EX) Apply 1 application topically daily as needed (Rosacea).      Marland Kitchen ketoconazole (NIZORAL) 2 % cream Apply 1 application topically daily as needed for irritation (Corner of mouth).     . lisinopril (PRINIVIL,ZESTRIL) 40 MG tablet Take 40 mg by mouth every evening.     Marland Kitchen LIVALO 4 MG TABS Take 2 mg by mouth at bedtime.     . Multiple Vitamin (MULTIVITAMIN WITH MINERALS) TABS tablet Take 1 tablet by mouth daily.     . mupirocin ointment (BACTROBAN) 2 % Place 1 application into the nose daily as needed (Tic bits).    . nitroGLYCERIN (NITROSTAT) 0.4 MG SL tablet Place 1 tablet (0.4 mg  total) under the tongue every 5 (five) minutes as needed for chest pain. 25 tablet 1  . omeprazole (PRILOSEC) 20 MG capsule Take 20 mg by mouth daily.      No current facility-administered medications for this encounter.    Allergies  Allergen Reactions  . Quinolones Hypertension    Patient was warned about not using Cipro and similar antibiotics. Recent studies have raised concern that fluoroquinolone antibiotics could be associated with an increased risk of aortic aneurysm Fluoroquinolones have non-antimicrobial properties that might jeopardise the integrity of the extracellular matrix of the vascular wall In a  propensity score matched cohort study in [place], there was a 66% increased rate of aortic aneurysm or dissection associated with oral fluoroquinolone use, compared wit  . Promethazine Hcl Other (See Comments)    Agitation and incoherence (phenergan)  . Vesicare [Solifenacin Succinate] Itching and Rash  . Atorvastatin     Other reaction(s): Myalgia  . Ezetimibe-Simvastatin     Other reaction(s): Joints    Social History   Socioeconomic History  . Marital status: Married    Spouse name: Not on file  . Number of children: 1  . Years of education: Not on file  . Highest education level: Not on file  Occupational History  . Occupation: retired Advertising account executive: RETIRED  Tobacco Use  . Smoking status: Former Smoker    Quit date: 04/09/1969    Years  since quitting: 51.1  . Smokeless tobacco: Current User    Types: Chew  . Tobacco comment: reports that when he smoked it was intermittent  Vaping Use  . Vaping Use: Unknown  Substance and Sexual Activity  . Alcohol use: Yes    Alcohol/week: 1.0 standard drink    Types: 1 Standard drinks or equivalent per week    Comment: occasional mainly during holiday  . Drug use: No  . Sexual activity: Yes  Other Topics Concern  . 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  Physical Exam: Vitals:   05/20/20 1005  BP: 116/82  Pulse: (!) 106  Weight: 130.5 kg  Height: 6\' 7"  (2.007 m)    GEN- The patient is well appearing obese elderly male, alert and oriented x 3 today.   HEENT-head normocephalic, atraumatic, sclera clear, conjunctiva pink, hearing intact, trachea midline. Lungs- Clear to ausculation bilaterally, normal work of breathing Heart- irregular rate and rhythm, no murmurs, rubs or gallops  GI- soft, NT, ND, + BS Extremities- no clubbing, cyanosis, or edema MS- no significant deformity or atrophy Skin- no rash or lesion Psych- euthymic mood, full affect Neuro- strength and sensation are intact   Wt Readings from Last 3 Encounters:  05/20/20 130.5 kg  02/24/20 132.5 kg  01/04/20 131.4 kg    EKG today demonstrates  Atypical atrial flutter with variable block Vent. rate 106 BPM QRS duration 104 ms QT/QTc 318/422 ms  Echo 02/26/19 demonstrated   1. Left ventricular ejection fraction, by visual estimation, is 50 to 55%.  The left ventricle has normal function. There is moderately increased left ventricular hypertrophy.  2. Definity contrast agent was given IV to delineate the left ventricular endocardial borders.  3. Left ventricular diastolic function could not be evaluated.  4. Global right ventricle has normal systolic function.The right ventricular size is normal.  5. Left atrial size was normal.  6. Right atrial size was normal.  7. The mitral valve is normal in structure. Trace mitral valve regurgitation. No evidence of mitral stenosis.  8. The tricuspid valve is normal in structure. Tricuspid valve regurgitation is trivial.  9. The aortic valve is tricuspid. Aortic valve regurgitation is mild. Mild aortic valve sclerosis without stenosis. 10. The pulmonic valve was not well visualized. Pulmonic valve regurgitation is not visualized. 11. Aortic dilatation noted. 12. 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 13. The inferior vena cava is normal in size with greater than 50% respiratory variability, suggesting right atrial pressure of 3 mmHg. 14. Definity used; normal LV systolic function; moderate LVH; moderate to severe dilatation of aortic root/ascending aorta (5.2 cm; suggest CTA or MRA to further assess); mild AI.  LA 4.8 cm  Epic records are reviewed at length today  Assessment and Plan:  1. Persistent atrial fibrillation/atrial flutter S/p afib ablation 05/12/19 with Dr Johney Frame. Patient in atypical atrial flutter today.  Will plan for DCCV. Check bmet/CBC Continue Eliquis 5 mg BID He has been intolerant of BB in the past 2/2 bradycardia.  This patients CHA2DS2-VASc Score and unadjusted Ischemic Stroke Rate (% per year) is equal to 4.8 % stroke rate/year from a score of 4  Above score calculated as 1 point each if present [CHF, HTN, DM, Vascular=MI/PAD/Aortic Plaque, Age if 65-74, or Male] Above score calculated as 2 points each if  present [Age > 75, or Stroke/TIA/TE]  2. Obesity Body mass index is 32.42 kg/m. Lifestyle modification was discussed and encouraged including regular physical activity and weight reduction.  3. HTN Stable, no changes today.  4. CAD S/p RCA stenting 2004. No anginal symptoms.   Follow up in the AF clinic one week post DCCV.    [name] PA-C Afib Clinic [facility] [address] [place], [place] [address] [phone] 05/20/2020 10:12 AM

## 2020-05-23 ENCOUNTER — Other Ambulatory Visit (HOSPITAL_COMMUNITY)
Admission: RE | Admit: 2020-05-23 | Discharge: 2020-05-23 | Disposition: A | Discharge status: Home / Self Care | Payer: Medicare PPO | Source: Ambulatory Visit | Attending: Cardiology | Admitting: Cardiology

## 2020-05-23 DIAGNOSIS — Z01812 Encounter for preprocedural laboratory examination: Secondary | ICD-10-CM | POA: Insufficient documentation

## 2020-05-23 DIAGNOSIS — Z20822 Contact with and (suspected) exposure to covid-19: Secondary | ICD-10-CM | POA: Diagnosis not present

## 2020-05-23 LAB — SARS CORONAVIRUS 2 (TAT 6-24 HRS): SARS Coronavirus 2: NEGATIVE

## 2020-05-25 ENCOUNTER — Encounter (HOSPITAL_COMMUNITY): Payer: Self-pay | Admitting: Registered Nurse

## 2020-05-25 ENCOUNTER — Ambulatory Visit (HOSPITAL_COMMUNITY)
Admission: RE | Admit: 2020-05-25 | Discharge: 2020-05-25 | Disposition: A | Discharge status: Home / Self Care | Payer: Medicare PPO | Attending: Cardiology | Admitting: Cardiology

## 2020-05-25 ENCOUNTER — Encounter (HOSPITAL_COMMUNITY)
Admission: RE | Disposition: A | Discharge status: Home / Self Care | Payer: Self-pay | Source: Home / Self Care | Attending: Cardiology

## 2020-05-25 DIAGNOSIS — I4510 Unspecified right bundle-branch block: Secondary | ICD-10-CM | POA: Insufficient documentation

## 2020-05-25 DIAGNOSIS — I480 Paroxysmal atrial fibrillation: Secondary | ICD-10-CM | POA: Diagnosis not present

## 2020-05-25 DIAGNOSIS — I4892 Unspecified atrial flutter: Secondary | ICD-10-CM | POA: Diagnosis not present

## 2020-05-25 SURGERY — CANCELLED PROCEDURE

## 2020-05-25 NOTE — Progress Notes (Signed)
Patient noted to be in sinus rhythm on arrival for cardioversion. DCCV cancelled. Will continue current medications.  Laurance Flatten, MD

## 2020-05-25 NOTE — Progress Notes (Signed)
Patient came into have cardioversion done. Patient is in normal sinus rhythm. EKG to confirm. Dr. Shari Prows confirmed. Procedure cancelled.

## 2020-06-01 ENCOUNTER — Encounter (HOSPITAL_COMMUNITY): Payer: Self-pay | Admitting: Physician Assistant

## 2020-06-01 ENCOUNTER — Ambulatory Visit (HOSPITAL_COMMUNITY)
Admission: RE | Admit: 2020-06-01 | Discharge: 2020-06-01 | Disposition: A | Discharge status: Home / Self Care | Payer: Medicare PPO | Source: Ambulatory Visit | Attending: Physician Assistant | Admitting: Physician Assistant

## 2020-06-01 ENCOUNTER — Other Ambulatory Visit: Payer: Self-pay

## 2020-06-01 VITALS — BP 120/78 | HR 79 | Ht 79.0 in | Wt 287.2 lb

## 2020-06-01 DIAGNOSIS — I2510 Atherosclerotic heart disease of native coronary artery without angina pectoris: Secondary | ICD-10-CM | POA: Insufficient documentation

## 2020-06-01 DIAGNOSIS — Z7901 Long term (current) use of anticoagulants: Secondary | ICD-10-CM | POA: Diagnosis not present

## 2020-06-01 DIAGNOSIS — Z79899 Other long term (current) drug therapy: Secondary | ICD-10-CM | POA: Diagnosis not present

## 2020-06-01 DIAGNOSIS — I484 Atypical atrial flutter: Secondary | ICD-10-CM

## 2020-06-01 DIAGNOSIS — I4819 Other persistent atrial fibrillation: Secondary | ICD-10-CM | POA: Diagnosis not present

## 2020-06-01 DIAGNOSIS — I712 Thoracic aortic aneurysm, without rupture: Secondary | ICD-10-CM | POA: Diagnosis not present

## 2020-06-01 DIAGNOSIS — Z87891 Personal history of nicotine dependence: Secondary | ICD-10-CM | POA: Insufficient documentation

## 2020-06-01 DIAGNOSIS — I10 Essential (primary) hypertension: Secondary | ICD-10-CM | POA: Diagnosis not present

## 2020-06-01 DIAGNOSIS — Z6832 Body mass index (BMI) 32.0-32.9, adult: Secondary | ICD-10-CM | POA: Diagnosis not present

## 2020-06-01 DIAGNOSIS — I4892 Unspecified atrial flutter: Secondary | ICD-10-CM | POA: Insufficient documentation

## 2020-06-01 DIAGNOSIS — D6869 Other thrombophilia: Secondary | ICD-10-CM

## 2020-06-01 DIAGNOSIS — E669 Obesity, unspecified: Secondary | ICD-10-CM | POA: Diagnosis not present

## 2020-06-01 NOTE — Progress Notes (Signed)
Primary Care Physician: Rodrigo RanPerini, Mark, MD Primary Cardiologist: Dr Clifton JamesMcAlhany Primary Electrophysiologist: Dr Johney FrameAllred Referring Physician: Dr Herbert PunMcAlhany   Ronald [name] is a 80 y.o. male with a history of CAD s/p stent RCA 2004, HTN, HLD, thoracic aortic aneurysm(followed by Dr. Tyrone SageGerhardt), and persistent atrial fibrillation who [facility].  The patient was initially diagnosed with atrial fibrillation on 02/17/19 after presenting with symptoms of SOB and was found to be in rate controlled afib. He was started on Eliquis for a CHADS2VASC score of 4. He underwent DCCV on 03/13/19. He reports that he had some minimal improvement in his symptoms of dyspnea but it is still persistent. He denies significant snoring or alcohol use. Patient is s/p DCCV on 04/15/19. Patient reports that he was in SR for 4 days with no symptoms of SOB and greatly increased energy level. Unfortunately, he had return of his afib. Patient is s/p afib ablation with Dr Johney FrameAllred on 05/12/19 with a DCCV on 06/09/19.   On follow up today, patient presented for DCCV on 05/25/20 but he had spontaneously converted and the procedure was cancelled. He is still in SR today.   Today, he denies symptoms of palpitations, chest pain, orthopnea, PND, lower extremity edema, dizziness, presyncope, syncope, snoring, daytime somnolence, bleeding, or neurologic sequela. The patient is tolerating medications without difficulties and is otherwise without complaint today.    Atrial Fibrillation Risk Factors:  he does not have symptoms or diagnosis of sleep apnea. he does not have a history of rheumatic fever. he does not have a history of alcohol use. The patient does have a history of early familial atrial fibrillation or other arrhythmias. Mother had afib.  he has a BMI of Body mass index is 32.35 kg/m.Marland Kitchen. Filed Weights   06/01/20 1001  Weight: 130.3 kg    Family History  Problem Relation Age  of Onset  . Heart disease Father   . Heart attack Father        [id number]  . Cancer Mother [id number]       LUNG  . Hypertension Neg Hx   . Stroke Neg Hx      Atrial Fibrillation Management history:  Previous antiarrhythmic drugs: amiodarone Previous cardioversions: 03/13/19, 04/15/19, 06/09/19 Previous ablations: 05/12/19 CHADS2VASC score: 4 Anticoagulation history: Eliquis   Past Medical History:  Diagnosis Date  . Arthritis   . Coronary artery disease    Stent 2004  . GERD (gastroesophageal reflux disease)   . History of echocardiogram    Echo 5/17: mild LVH, EF 50-55%, no RWMA, Gr 2 DD, mild to mod AI, PASP 32 mmHg  . History of nuclear stress test    Myoview 5/17 - EF 48%, no scar, no ischemia. Low Risk  . History of pneumonia    80 years old  . Hx of Bell's palsy    left face  . Hypercholesteremia    managed by dr Waynard Edwardsperini  . Hypertension   . 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   . Lung nodules    noted on past CT scans  . Malaria   . SVT (supraventricular tachycardia) (HCC)    remote episode during exercise test  . Thoracic aortic aneurysm [facility](HCC)    last scan in Jan 2013. Now measuring 5.0cm. Referred to Dr. Tyrone SageGerhardt  . Wears dentures  bottom  . Wears glasses    Past Surgical History:  Procedure Laterality Date  . ATRIAL FIBRILLATION ABLATION N/A 05/12/2019   Procedure: ATRIAL FIBRILLATION ABLATION;  Surgeon: Hillis Range, MD;  Location: [facility] INVASIVE CV LAB;  Service: Cardiovascular;  Laterality: N/A;  . CARDIOVERSION N/A 03/13/2019   Procedure: CARDIOVERSION;  Surgeon: Pricilla Riffle, MD;  Location: [facility] ENDOSCOPY;  Service: Cardiovascular;  Laterality: N/A;  . CARDIOVERSION N/A 04/15/2019   Procedure: CARDIOVERSION;  Surgeon: Vesta Mixer, MD;  Location: [facility] ENDOSCOPY;  Service: Cardiovascular;  Laterality: N/A;  . CARDIOVERSION N/A 06/16/2019   Procedure: CARDIOVERSION;  Surgeon:  Chilton Si, MD;  Location: [facility] ENDOSCOPY;  Service: Cardiovascular;  Laterality: N/A;  . CERVICAL FUSION  03-2010   c2-c7  . COLONOSCOPY     x4  . CORONARY STENT PLACEMENT  2004  . dental implant     x 2  . ELBOW SURGERY  1997   lt and rt  . EYE SURGERY    . HIP PINNING,CANNULATED Right 05/11/2014   Procedure: CANNULATED HIP PINNING;  Surgeon: Sheral Apley, MD;  Location: [facility] OR;  Service: Orthopedics;  Laterality: Right;  . RETINAL DETACHMENT SURGERY  1975   lt  . rotator cuff surgery  1997   lt  . SHOULDER ARTHROSCOPY  2/15   right-got cardiac clearance-gsc  . TONSILLECTOMY    . TRIGGER FINGER RELEASE Left 10/12/2013   Procedure: LEFT LONG FINGER TRIGGER RELEASE;  Surgeon: Jodi Marble, MD;  Location: [facility];  Service: Orthopedics;  Laterality: Left;    Current Outpatient Medications  Medication Sig Dispense Refill  . apixaban (ELIQUIS) 5 MG TABS tablet Take 1 tablet (5 mg total) by mouth 2 (two) times daily. 60 tablet 8  . chlorthalidone (HYGROTON) 25 MG tablet Take 25 mg by mouth daily.     . clobetasol ointment (TEMOVATE) 0.05 % Apply 1 application topically 2 (two) times daily as needed (itchy scaly on legs).     . Coenzyme Q10 (COQ-10) 200 MG CAPS Take 200 mg by mouth every evening.    . colesevelam (WELCHOL) 625 MG tablet Take 3,750 mg by mouth daily with breakfast.    . ezetimibe (ZETIA) 10 MG tablet Take 10 mg by mouth daily.    . fluticasone (FLONASE) 50 MCG/ACT nasal spray Place 1 spray into both nostrils daily as needed for allergies (sinus congestion).    . Glucosamine HCl 1500 MG TABS Take 3,000 mg by mouth daily.    . hydrocortisone 2.5 % cream Apply 1 application topically as needed (eye brows flakey). Use on head    . Ivermectin (SOOLANTRA EX) Apply 1 application topically daily as needed (Rosacea).     Marland Kitchen ketoconazole (NIZORAL) 2 % cream Apply 1 application topically daily as needed for irritation (Corner of mouth (sores)).    Marland Kitchen  lisinopril (PRINIVIL,ZESTRIL) 40 MG tablet Take 40 mg by mouth every evening.     Marland Kitchen LIVALO 4 MG TABS Take 2 mg by mouth at bedtime.     . Multiple Vitamin (MULTIVITAMIN WITH MINERALS) TABS tablet Take 1 tablet by mouth daily.     . mupirocin ointment (BACTROBAN) 2 % Apply 1 application topically daily as needed (tick bites).    . nitroGLYCERIN (NITROSTAT) 0.4 MG SL tablet Place 1 tablet (0.4 mg total) under the tongue every 5 (five) minutes as needed for chest pain. 25 tablet 1  . omeprazole (PRILOSEC) 20 MG capsule Take 20 mg by mouth in the  morning and at bedtime. BEFORE BREAKFAST & BEFORE SUPPER     No current facility-administered medications for this encounter.    Allergies  Allergen Reactions  . Quinolones Hypertension    Patient was warned about not using Cipro and similar antibiotics. Recent studies have raised concern that fluoroquinolone antibiotics could be associated with an increased risk of aortic aneurysm Fluoroquinolones have non-antimicrobial properties that might jeopardise the integrity of the extracellular matrix of the vascular wall In a  propensity score matched cohort study in [place], there was a 66% increased rate of aortic aneurysm or dissection associated with oral fluoroquinolone use, compared wit  . Promethazine Hcl Other (See Comments)    Agitation and incoherence (phenergan)  . Vesicare [Solifenacin Succinate] Itching and Rash  . Atorvastatin     Other reaction(s): Myalgia  . Ezetimibe-Simvastatin     Other reaction(s): Joints    Social History   Socioeconomic History  . Marital status: Married    Spouse name: Not on file  . Number of children: 1  . Years of education: Not on file  . Highest education level: Not on file  Occupational History  . Occupation: retired Advertising account executive: RETIRED  Tobacco Use  . Smoking status: Former Smoker    Quit date: 04/09/1969    Years since quitting: 51.1  . Smokeless tobacco: Current User    Types: Chew   . Tobacco comment: reports that when he smoked it was intermittent  Vaping Use  . Vaping Use: Unknown  Substance and Sexual Activity  . Alcohol use: Yes    Alcohol/week: 1.0 standard drink    Types: 1 Standard drinks or equivalent per week    Comment: occasional mainly during holiday  . Drug use: No  . Sexual activity: Yes  Other Topics Concern  . 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  Physical Exam: Vitals:   06/01/20 1001  BP: 120/78  Pulse: 79  Weight: 130.3 kg  Height: 6\' 7"  (2.007 m)    GEN- The patient is well appearing obese elderly male, alert and oriented x 3 today.   HEENT-head normocephalic, atraumatic, sclera clear, conjunctiva pink, hearing intact, trachea midline. Lungs- Clear to ausculation bilaterally, normal work of breathing Heart- Regular rate and rhythm, no murmurs, rubs or gallops  GI- soft, NT, ND, + BS Extremities- no clubbing, cyanosis, or edema MS- no significant deformity or atrophy Skin- no rash or lesion Psych- euthymic mood, full affect Neuro- strength and sensation are intact   Wt Readings from Last 3 Encounters:  06/01/20 130.3 kg  05/20/20 130.5 kg  02/24/20 132.5 kg    EKG today demonstrates  SR Vent. rate 79 BPM PR interval 178 ms QRS duration 114 ms QT/QTc 392/449 ms  Echo 02/26/19 demonstrated   1. Left ventricular ejection fraction, by visual estimation, is 50 to 55%. The left ventricle has normal function. There is moderately increased left ventricular hypertrophy.   2. Definity contrast agent was given IV to  delineate the left ventricular endocardial borders.  3. Left ventricular diastolic function could not be evaluated.  4. Global right ventricle has normal systolic function.The right ventricular size is normal.  5. Left atrial size was normal.  6. Right atrial size was normal.  7. The mitral valve is normal in structure. Trace mitral valve regurgitation. No evidence of mitral stenosis.  8. The tricuspid valve is normal in structure. Tricuspid valve regurgitation is trivial.  9. The aortic valve is tricuspid. Aortic valve regurgitation is mild. Mild aortic valve sclerosis without stenosis. 10. The pulmonic valve was not well visualized. Pulmonic valve regurgitation is not visualized. 11. Aortic dilatation noted. 12. 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 13. The inferior vena cava is normal in size with greater than 50% respiratory variability, suggesting right atrial pressure of 3 mmHg. 14. Definity used; normal LV systolic function; moderate LVH; moderate to severe dilatation of aortic root/ascending aorta (5.2 cm; suggest CTA or MRA to further assess); mild AI.  LA 4.8 cm  Epic records are reviewed at length today  Assessment and Plan:  1. Persistent atrial fibrillation/atrial flutter S/p afib ablation 05/12/19 with Dr Johney Frame. Patient spontaneously converted back to SR. If his afib burden increases, question if he would be a candidate for repeat ablation.  Continue Eliquis 5 mg BID He has been intolerant of BB in the past 2/2 bradycardia.  This patients CHA2DS2-VASc Score and unadjusted Ischemic Stroke Rate (% per year) is equal to 4.8 % stroke rate/year from a score of 4  Above score calculated as 1 point each if present [CHF, HTN, DM, Vascular=MI/PAD/Aortic Plaque, Age if 65-74, or Male] Above score calculated as 2 points each if present [Age > 75, or Stroke/TIA/TE]  2.  Obesity Body mass index is 32.35 kg/m. Lifestyle modification was discussed and encouraged including regular physical activity and weight reduction.  3. HTN Stable, no changes today.  4. CAD S/p RCA stenting 2004. No anginal symptoms.   Follow up with Dr Johney Frame and Dr Clifton Boysie per recall.    [name] PA-C [facility] [address] [place], [place] [address] [phone] 06/01/2020 10:09 AM

## 2020-06-17 DIAGNOSIS — L570 Actinic keratosis: Secondary | ICD-10-CM | POA: Diagnosis not present

## 2020-06-17 DIAGNOSIS — L820 Inflamed seborrheic keratosis: Secondary | ICD-10-CM | POA: Diagnosis not present

## 2020-06-17 DIAGNOSIS — Z85828 Personal history of other malignant neoplasm of skin: Secondary | ICD-10-CM | POA: Diagnosis not present

## 2020-06-17 DIAGNOSIS — L718 Other rosacea: Secondary | ICD-10-CM | POA: Diagnosis not present

## 2020-06-17 DIAGNOSIS — L821 Other seborrheic keratosis: Secondary | ICD-10-CM | POA: Diagnosis not present

## 2020-06-17 DIAGNOSIS — D485 Neoplasm of uncertain behavior of skin: Secondary | ICD-10-CM | POA: Diagnosis not present

## 2020-06-17 DIAGNOSIS — C44319 Basal cell carcinoma of skin of other parts of face: Secondary | ICD-10-CM | POA: Diagnosis not present

## 2020-06-17 DIAGNOSIS — D225 Melanocytic nevi of trunk: Secondary | ICD-10-CM | POA: Diagnosis not present

## 2020-06-25 ENCOUNTER — Emergency Department (HOSPITAL_COMMUNITY)
Admission: EM | Admit: 2020-06-25 | Discharge: 2020-06-25 | Disposition: A | Discharge status: Home / Self Care | Payer: Medicare PPO | Attending: Emergency Medicine | Admitting: Emergency Medicine

## 2020-06-25 ENCOUNTER — Emergency Department (HOSPITAL_COMMUNITY): Payer: Medicare PPO

## 2020-06-25 ENCOUNTER — Other Ambulatory Visit: Payer: Self-pay

## 2020-06-25 DIAGNOSIS — I499 Cardiac arrhythmia, unspecified: Secondary | ICD-10-CM | POA: Diagnosis present

## 2020-06-25 DIAGNOSIS — Z955 Presence of coronary angioplasty implant and graft: Secondary | ICD-10-CM | POA: Insufficient documentation

## 2020-06-25 DIAGNOSIS — R079 Chest pain, unspecified: Secondary | ICD-10-CM | POA: Diagnosis not present

## 2020-06-25 DIAGNOSIS — I10 Essential (primary) hypertension: Secondary | ICD-10-CM | POA: Insufficient documentation

## 2020-06-25 DIAGNOSIS — I4892 Unspecified atrial flutter: Secondary | ICD-10-CM | POA: Insufficient documentation

## 2020-06-25 DIAGNOSIS — Z79899 Other long term (current) drug therapy: Secondary | ICD-10-CM | POA: Diagnosis not present

## 2020-06-25 DIAGNOSIS — Z87891 Personal history of nicotine dependence: Secondary | ICD-10-CM | POA: Insufficient documentation

## 2020-06-25 DIAGNOSIS — Z7901 Long term (current) use of anticoagulants: Secondary | ICD-10-CM | POA: Diagnosis not present

## 2020-06-25 DIAGNOSIS — I2510 Atherosclerotic heart disease of native coronary artery without angina pectoris: Secondary | ICD-10-CM | POA: Insufficient documentation

## 2020-06-25 LAB — CBC
HCT: 46.8 % (ref 39.0–52.0)
Hemoglobin: 16 g/dL (ref 13.0–17.0)
MCH: 31.4 pg (ref 26.0–34.0)
MCHC: 34.2 g/dL (ref 30.0–36.0)
MCV: 91.9 fL (ref 80.0–100.0)
Platelets: 248 10*3/uL (ref 150–400)
RBC: 5.09 MIL/uL (ref 4.22–5.81)
RDW: 12.6 % (ref 11.5–15.5)
WBC: 10.2 10*3/uL (ref 4.0–10.5)
nRBC: 0 % (ref 0.0–0.2)

## 2020-06-25 LAB — TROPONIN I (HIGH SENSITIVITY): Troponin I (High Sensitivity): 17 ng/L (ref <18)

## 2020-06-25 LAB — BASIC METABOLIC PANEL
Anion gap: 11 (ref 5–15)
BUN: 24 mg/dL — ABNORMAL HIGH (ref 8–23)
CO2: 19 mmol/L — ABNORMAL LOW (ref 22–32)
Calcium: 8.8 mg/dL — ABNORMAL LOW (ref 8.9–10.3)
Chloride: 106 mmol/L (ref 98–111)
Creatinine, Ser: 1.36 mg/dL — ABNORMAL HIGH (ref 0.61–1.24)
GFR, Estimated: 54 mL/min — ABNORMAL LOW (ref >60)
Glucose, Bld: 229 mg/dL — ABNORMAL HIGH (ref 70–99)
Potassium: 3.6 mmol/L (ref 3.5–5.1)
Sodium: 136 mmol/L (ref 135–145)

## 2020-06-25 MED ORDER — METOPROLOL TARTRATE 5 MG/5ML IV SOLN: 5.0000 mg | Freq: Once | INTRAVENOUS | Status: DC

## 2020-06-25 MED ORDER — DILTIAZEM HCL 60 MG PO TABS
60.0000 mg | ORAL_TABLET | Freq: Once | ORAL | Status: DC
  Filled 2020-06-25: qty 1

## 2020-06-25 MED ORDER — AMIODARONE HCL 200 MG PO TABS: 200.0000 mg | ORAL_TABLET | Freq: Two times a day (BID) | ORAL | 1 refills | Status: DC

## 2020-06-25 MED ORDER — DILTIAZEM HCL 25 MG/5ML IV SOLN
10.0000 mg | Freq: Once | INTRAVENOUS | Status: AC
  Administered 2020-06-25: 10 mg via INTRAVENOUS
  Filled 2020-06-25: qty 5

## 2020-06-25 NOTE — Discharge Instructions (Signed)
Take Amiodarone twice daily until you see your cardiologist Please do less exertion / exercise - as this seems to make it worse.

## 2020-06-25 NOTE — ED Provider Notes (Signed)
[facility] EMERGENCY DEPARTMENT Provider Note   CSN: [id number] Arrival date & time: 06/25/20  2111     History Chief Complaint  Patient presents with  . Chest Pain    Ronald [name] is a 80 y.o. male.  HPI   This patient is a 80 year old male, he has a known history of coronary disease with stenting in 2004, ejection fraction of 50 to 55% based on echocardiogram from 2020, history of prior supraventricular tachycardia, atrial fibrillation and atrial flutter status post multiple cardioversion attempts.  Most recently in February he was seen at the cardiology office but had converted to normal sinus rhythm prior to being cardioverted.  He has not tolerated beta-blockers in the past, due to bradycardia.  He has been on amiodarone in the past but is not currently taking it.  He is on Eliquis.  The patient was in his usual state of health until he got up this morning, he noted immediately that he was short of breath and felt like his heart was irregular.  It has been that way all day long today.  He presented initially approximately 45 minutes ago with the symptoms and was brought back to our treatment room.  He denies fevers chills nausea vomiting or diarrhea, there is no swelling of the legs, no rashes of the skin, no upper respiratory symptoms and no headache.  Past Medical History:  Diagnosis Date  . Arthritis   . Coronary artery disease    Stent 2004  . GERD (gastroesophageal reflux disease)   . History of echocardiogram    Echo 5/17: mild LVH, EF 50-55%, no RWMA, Gr 2 DD, mild to mod AI, PASP 32 mmHg  . History of nuclear stress test    Myoview 5/17 - EF 48%, no scar, no ischemia. Low Risk  . History of pneumonia    80 years old  . Hx of Bell's palsy    left face  . Hypercholesteremia    managed by dr Waynard Edwards  . Hypertension   . 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   . Lung nodules    noted on past CT scans  . Malaria   . SVT (supraventricular tachycardia) (HCC)    remote episode during exercise test  . Thoracic aortic aneurysm [facility])    last scan in Jan 2013. Now measuring 5.0cm. Referred to Dr. Tyrone Sage  . Wears dentures    bottom  . Wears glasses     Patient Active Problem List   Diagnosis Date Noted  . Atypical atrial flutter (HCC) 05/20/2020  . Hematoma of groin 05/12/2019  . Longstanding persistent atrial fibrillation (HCC) 03/16/2019  . Secondary hypercoagulable state (HCC) 03/16/2019  . Pulmonary nodules/lesions, multiple 12/03/2013  . CAD (coronary artery disease) 11/14/2010  . Thoracic aortic aneurysm (HCC) 11/14/2010    Past Surgical History:  Procedure Laterality Date  . ATRIAL FIBRILLATION ABLATION N/A 05/12/2019   Procedure: ATRIAL FIBRILLATION ABLATION;  Surgeon: Hillis Range, MD;  Location: [facility] INVASIVE CV LAB;  Service: Cardiovascular;  Laterality: N/A;  . CARDIOVERSION N/A 03/13/2019   Procedure: CARDIOVERSION;  Surgeon: Pricilla Riffle, MD;  Location: [facility] ENDOSCOPY;  Service: Cardiovascular;  Laterality: N/A;  . CARDIOVERSION N/A 04/15/2019   Procedure: CARDIOVERSION;  Surgeon: Vesta Mixer, MD;  Location: [facility] ENDOSCOPY;  Service: Cardiovascular;  Laterality: N/A;  . CARDIOVERSION N/A 06/16/2019  Procedure: CARDIOVERSION;  Surgeon: Chilton Si, MD;  Location: [facility] ENDOSCOPY;  Service: Cardiovascular;  Laterality: N/A;  . CERVICAL FUSION  03-2010   c2-c7  . COLONOSCOPY     x4  . CORONARY STENT PLACEMENT  2004  . dental implant     x 2  . ELBOW SURGERY  1997   lt and rt  . EYE SURGERY    . HIP PINNING,CANNULATED Right 05/11/2014   Procedure: CANNULATED HIP PINNING;  Surgeon: Sheral Apley, MD;  Location: [facility] OR;  Service: Orthopedics;  Laterality: Right;  . RETINAL DETACHMENT SURGERY  1975   lt  . rotator cuff surgery  1997   lt  . SHOULDER ARTHROSCOPY  2/15   right-got cardiac clearance-[facility]  .  TONSILLECTOMY    . TRIGGER FINGER RELEASE Left 10/12/2013   Procedure: LEFT LONG FINGER TRIGGER RELEASE;  Surgeon: Jodi Marble, MD;  Location: [facility];  Service: Orthopedics;  Laterality: Left;       Family History  Problem Relation Age of Onset  . Heart disease Father   . Heart attack Father        33  . Cancer Mother 1       LUNG  . Hypertension Neg Hx   . Stroke Neg Hx     Social History   Tobacco Use  . Smoking status: Former Smoker    Quit date: 04/09/1969    Years since quitting: 51.2  . Smokeless tobacco: Current User    Types: Chew  . Tobacco comment: reports that when he smoked it was intermittent  Vaping Use  . Vaping Use: Unknown  Substance Use Topics  . Alcohol use: Yes    Alcohol/week: 1.0 standard drink    Types: 1 Standard drinks or equivalent per week    Comment: occasional mainly during holiday  . Drug use: No    Home Medications Prior to Admission medications   Medication Sig Start Date End Date Taking? Authorizing Provider  amiodarone (PACERONE) 200 MG tablet Take 1 tablet (200 mg total) by mouth 2 (two) times daily. 06/25/20  Yes Eber Hong, MD  apixaban (ELIQUIS) 5 MG TABS tablet Take 1 tablet (5 mg total) by mouth 2 (two) times daily. 04/11/20   Kathleene Hazel, MD  chlorthalidone (HYGROTON) 25 MG tablet Take 25 mg by mouth daily.     [provider]  clobetasol ointment (TEMOVATE) 0.05 % Apply 1 application topically 2 (two) times daily as needed (itchy scaly on legs).     [provider]  Coenzyme Q10 (COQ-10) 200 MG CAPS Take 200 mg by mouth every evening.    [provider]  colesevelam (WELCHOL) 625 MG tablet Take 3,750 mg by mouth daily with breakfast.    [provider]  ezetimibe (ZETIA) 10 MG tablet Take 10 mg by mouth daily.    [provider]  fluticasone (FLONASE) 50 MCG/ACT nasal spray Place 1 spray into both nostrils daily as needed for allergies (sinus  congestion).    [provider]  Glucosamine HCl 1500 MG TABS Take 3,000 mg by mouth daily.    [provider]  hydrocortisone 2.5 % cream Apply 1 application topically as needed (eye brows flakey). Use on head 07/09/14   [provider]  Ivermectin (SOOLANTRA EX) Apply 1 application topically daily as needed (Rosacea).     [provider]  ketoconazole (NIZORAL) 2 % cream Apply 1 application topically daily as needed for irritation Engineer, water of  mouth (sores)).    [provider]  lisinopril (PRINIVIL,ZESTRIL) 40 MG tablet Take 40 mg by mouth every evening.     [provider]  LIVALO 4 MG TABS Take 2 mg by mouth at bedtime.  02/24/19   [provider]  Multiple Vitamin (MULTIVITAMIN WITH MINERALS) TABS tablet Take 1 tablet by mouth daily.     [provider]  mupirocin ointment (BACTROBAN) 2 % Apply 1 application topically daily as needed (tick bites).    [provider]  nitroGLYCERIN (NITROSTAT) 0.4 MG SL tablet Place 1 tablet (0.4 mg total) under the tongue every 5 (five) minutes as needed for chest pain. 04/07/19   Kathleene HazelMcAlhany, Christopher D, MD  omeprazole (PRILOSEC) 20 MG capsule Take 20 mg by mouth in the morning and at bedtime. BEFORE BREAKFAST & BEFORE SUPPER    [provider]    Allergies    Quinolones, Promethazine hcl, Vesicare [solifenacin succinate], Atorvastatin, and Ezetimibe-simvastatin  Review of Systems   Review of Systems  All other systems reviewed and are negative.   Physical Exam Updated Vital Signs BP 133/88   Pulse (!) 51   Temp 98.1 F (36.7 C) (Oral)   Resp 20   Ht 2.007 m (6\' 7" )   Wt 129.3 kg   SpO2 96%   BMI 32.11 kg/m   Physical Exam Vitals and nursing note reviewed.  Constitutional:      General: He is not in acute distress.    Appearance: He is well-developed.  HENT:     Head: Normocephalic and atraumatic.     Mouth/Throat:     Pharynx: No oropharyngeal  exudate.  Eyes:     General: No scleral icterus.       Right eye: No discharge.        Left eye: No discharge.     Conjunctiva/sclera: Conjunctivae normal.     Pupils: Pupils are equal, round, and reactive to light.  Neck:     Thyroid: No thyromegaly.     Vascular: No JVD.  Cardiovascular:     Rate and Rhythm: Regular rhythm. Tachycardia present.     Heart sounds: Normal heart sounds. No murmur heard. No friction rub. No gallop.      Comments: Regular tachycardia about 153 bpm, occasionally goes into variable block Pulmonary:     Effort: Pulmonary effort is normal. No respiratory distress.     Breath sounds: Normal breath sounds. No wheezing or rales.  Abdominal:     General: Bowel sounds are normal. There is no distension.     Palpations: Abdomen is soft. There is no mass.     Tenderness: There is no abdominal tenderness.  Musculoskeletal:        General: No tenderness. Normal range of motion.     Cervical back: Normal range of motion and neck supple.  Lymphadenopathy:     Cervical: No cervical adenopathy.  Skin:    General: Skin is warm and dry.     Findings: No erythema or rash.  Neurological:     Mental Status: He is alert.     Coordination: Coordination normal.  Psychiatric:        Behavior: Behavior normal.     ED Results / Procedures / Treatments   Labs (all labs ordered are listed, but only abnormal results are displayed) Labs Reviewed  BASIC METABOLIC PANEL - Abnormal; Notable for the following components:      Result Value   CO2 19 (*)  Glucose, Bld 229 (*)    BUN 24 (*)    Creatinine, Ser 1.36 (*)    Calcium 8.8 (*)    GFR, Estimated 54 (*)    All other components within normal limits  CBC  TROPONIN I (HIGH SENSITIVITY)    EKG EKG Interpretation  Date/Time:  Saturday June 25 2020 21:22:58 EDT Ventricular Rate:  153 PR Interval:  90 QRS Duration: 106 QT Interval:  286 QTC Calculation: 456 R Axis:   -102 Text Interpretation: Sinus  tachycardia with short PR  vs atrial flutter with variable block Right superior axis deviation Incomplete right bundle branch block Right ventricular hypertrophy Anterior infarct , age undetermined Abnormal ECG Confirmed by Eber Hong ([id number]) on 06/25/2020 9:52:34 PM   Radiology DG Chest 2 View  Result Date: 06/25/2020 CLINICAL DATA:  Chest pain EXAM: CHEST - 2 VIEW COMPARISON:  08/02/2015 FINDINGS: Mild hyperinflation. Heart is normal size. Tortuous aorta. No confluent opacities or effusions. No acute bony abnormality. IMPRESSION: Hyperinflation.  No active disease. Electronically Signed   By: Charlett Nose M.D.   On: 06/25/2020 22:06    Procedures Procedures   Medications Ordered in ED Medications  diltiazem (CARDIZEM) injection 10 mg (10 mg Intravenous Given 06/25/20 2217)    ED Course  I have reviewed the triage vital signs and the nursing notes.  Pertinent labs & imaging results that were available during my care of the patient were reviewed by me and considered in my medical decision making (see chart for details).    MDM Rules/Calculators/A&P                          The patient is very tachycardic with a heart rate of about 153 bpm consistent with atrial flutter with 2-1 block.  He has been in a similar rhythm in the past but has also been diagnosed with persistent atrial fibrillation.  He was most recently in sinus rhythm about 1 month ago per the cardiology notes.  At this time the patient is definitely in an arrhythmia, he is normotensive, afebrile and has normal oxygen levels.  Lungs are clear, no other signs of source of this decompensation.  At this time I will give the patient a dose of Cardizem, he is adequately anticoagulated if we needed to cardiovert.  I discussed the case with cardiology however after receiving a small dose of intravenous Cardizem the patient is converted to a heart rate of 80 and what appears to be normal sinus rhythm.  At this time the patient is  symptom-free, feels better and is now able to tell me more of the story.  Evidently on Tuesday he was running a chainsaw all day and felt palpitations, it then got better when he rested.  When he woke up this morning his heart rate was 125, with rest it went down to 80 but then he placed 1000 pounds of mulch and it went back up into the 100s again, when he rested and went back down, he tried to put more mulch out in his own yard during which time his heart rate went into 150 and he became more short of breath.  Again at this time the patient is symptom-free, he is chemically converted and the cardiology on-call provider who I have spoken with suggest that he go back on amiodarone 200 mg twice daily until he follows up with Dr. Clifton Izek of the cardiology service.  The patient is agreeable to the plan  Rechecked - labs look good Stable for d/c.  Final Clinical Impression(s) / ED Diagnoses Final diagnoses:  Atrial flutter with rapid ventricular response (HCC)    Rx / DC Orders ED Discharge Orders         Ordered    amiodarone (PACERONE) 200 MG tablet  2 times daily        06/25/20 2308           Eber Hong, MD 06/25/20 2311

## 2020-06-25 NOTE — ED Triage Notes (Signed)
Patient coming from home with chest pain, hx of afib with cardioversion and ablation, on Eliquis, reports palpitations, shortness of breath and chest pain.

## 2020-06-25 NOTE — ED Notes (Signed)
E-signature pad unavailable at time of pt discharge. This RN discussed discharge materials with pt and answered all pt questions. Pt stated understanding of discharge material. ? ?

## 2020-06-27 ENCOUNTER — Telehealth: Payer: Self-pay

## 2020-06-27 NOTE — Telephone Encounter (Signed)
-----   Message from Kathleene Hazel, MD sent at 06/27/2020 10:41 AM EDT ----- Ronald [name] was seen back in the ED with atrial flutter this weekend. Can we get him back in to the atrial fib clinic this week if possible for follow up? Thanks, chris  ----- Message ----- From: Eber Hong, MD Sent: 06/25/2020  10:36 PM EDT To: Kathleene Hazel, MD  Hello,  I took care of [name] tonight in the emergency department, he came in because he was in atrial flutter with 2-1 block with a rate of 153, he had been overexerting himself over the last week doing chainsaw activity, spreading lots of mulch, lots of exercise.  He had intermittent tachycardia mixed with intermittent sinus rhythm at home at least clinically.  Here he converted back to sinus rhythm with 10 mg of Cardizem, he is anticoagulated but did not require electrical cardioversion.  He is symptom-free at this time, I did discuss the case with the on-call cardiology practitioner who recommended that he go back on amiodarone 200 mg twice a day until he follow-up with you.  I just wanted to keep you in the loop, thank you for letting me help take care of your patient, he looked well when he left, I have a great night  Arlys John

## 2020-06-27 NOTE — Telephone Encounter (Addendum)
I spoke with the pt and made hiim an appt with Cherlyn Labella PA...  pt says he has been feeling well since he was in the ED and is feeling well today but will call back if anything changes prior to his appt.

## 2020-07-01 ENCOUNTER — Encounter (HOSPITAL_COMMUNITY): Payer: Self-pay | Admitting: Physician Assistant

## 2020-07-01 ENCOUNTER — Other Ambulatory Visit: Payer: Self-pay

## 2020-07-01 ENCOUNTER — Ambulatory Visit (HOSPITAL_COMMUNITY)
Admission: RE | Admit: 2020-07-01 | Discharge: 2020-07-01 | Disposition: A | Discharge status: Home / Self Care | Payer: Medicare PPO | Source: Ambulatory Visit | Attending: Physician Assistant | Admitting: Physician Assistant

## 2020-07-01 VITALS — BP 122/82 | HR 70 | Ht 79.0 in | Wt 294.6 lb

## 2020-07-01 DIAGNOSIS — Z87891 Personal history of nicotine dependence: Secondary | ICD-10-CM | POA: Insufficient documentation

## 2020-07-01 DIAGNOSIS — E7800 Pure hypercholesterolemia, unspecified: Secondary | ICD-10-CM | POA: Insufficient documentation

## 2020-07-01 DIAGNOSIS — I4892 Unspecified atrial flutter: Secondary | ICD-10-CM | POA: Insufficient documentation

## 2020-07-01 DIAGNOSIS — D6869 Other thrombophilia: Secondary | ICD-10-CM

## 2020-07-01 DIAGNOSIS — Z6833 Body mass index (BMI) 33.0-33.9, adult: Secondary | ICD-10-CM | POA: Diagnosis not present

## 2020-07-01 DIAGNOSIS — E669 Obesity, unspecified: Secondary | ICD-10-CM | POA: Diagnosis not present

## 2020-07-01 DIAGNOSIS — I2510 Atherosclerotic heart disease of native coronary artery without angina pectoris: Secondary | ICD-10-CM | POA: Diagnosis not present

## 2020-07-01 DIAGNOSIS — Z955 Presence of coronary angioplasty implant and graft: Secondary | ICD-10-CM | POA: Insufficient documentation

## 2020-07-01 DIAGNOSIS — I10 Essential (primary) hypertension: Secondary | ICD-10-CM | POA: Diagnosis not present

## 2020-07-01 DIAGNOSIS — E785 Hyperlipidemia, unspecified: Secondary | ICD-10-CM | POA: Insufficient documentation

## 2020-07-01 DIAGNOSIS — Z7901 Long term (current) use of anticoagulants: Secondary | ICD-10-CM | POA: Diagnosis not present

## 2020-07-01 DIAGNOSIS — I4510 Unspecified right bundle-branch block: Secondary | ICD-10-CM | POA: Diagnosis not present

## 2020-07-01 DIAGNOSIS — I4819 Other persistent atrial fibrillation: Secondary | ICD-10-CM | POA: Diagnosis not present

## 2020-07-01 MED ORDER — AMIODARONE HCL 200 MG PO TABS: 200.0000 mg | ORAL_TABLET | Freq: Every day | ORAL | 1 refills | Status: DC

## 2020-07-01 NOTE — Progress Notes (Signed)
Primary Care Physician: [name], MD Primary Cardiologist: Dr Clifton Philipp Primary Electrophysiologist: Dr Johney Frame Referring Physician: Dr Herbert Pun is a 80 y.o. male with a history of CAD s/p stent RCA 2004, HTN, HLD, thoracic aortic aneurysm(followed by Dr. Tyrone Sage), and persistent atrial fibrillation who presents for follow up in the [facility].  The patient was initially diagnosed with atrial fibrillation on 02/17/19 after presenting with symptoms of SOB and was found to be in rate controlled afib. He was started on Eliquis for a CHADS2VASC score of 4. He underwent DCCV on 03/13/19. He reports that he had some minimal improvement in his symptoms of dyspnea but it is still persistent. He denies significant snoring or alcohol use. Patient is s/p DCCV on 04/15/19. Patient reports that he was in SR for 4 days with no symptoms of SOB and greatly increased energy level. Unfortunately, he had return of his afib. Patient is s/p afib ablation with Dr Johney Frame on 05/12/19 with a DCCV on 06/09/19.   On follow up today, patient presented to the ED 06/25/20 with lightheadedness and SOB and was found to be in rapid atrial flutter. He converted to SR with IV diltiazem and was started back on amiodarone. Patient admits he had been very active prior to the onset of his symptoms while working in the yard. He has not had any recurrent heart racing symptoms.  Today, he denies symptoms of palpitations, chest pain, orthopnea, PND, lower extremity edema, presyncope, syncope, snoring, daytime somnolence, bleeding, or neurologic sequela. The patient is tolerating medications without difficulties and is otherwise without complaint today.    Atrial Fibrillation Risk Factors:  he does not have symptoms or diagnosis of sleep apnea. he does not have a history of rheumatic fever. he does not have a history of alcohol use. The patient does have a history of early familial atrial  fibrillation or other arrhythmias. Mother had afib.  he has a BMI of Body mass index is 33.19 kg/m.Marland Kitchen Filed Weights   07/01/20 1006  Weight: 133.6 kg    Family History  Problem Relation Age of Onset  . Heart disease Father   . Heart attack Father        63  . Cancer Mother 34       LUNG  . Hypertension Neg Hx   . Stroke Neg Hx      Atrial Fibrillation Management history:  Previous antiarrhythmic drugs: amiodarone Previous cardioversions: 03/13/19, 04/15/19, 06/09/19 Previous ablations: 05/12/19 CHADS2VASC score: 4 Anticoagulation history: Eliquis   Past Medical History:  Diagnosis Date  . Arthritis   . Coronary artery disease    Stent 2004  . GERD (gastroesophageal reflux disease)   . History of echocardiogram    Echo 5/17: mild LVH, EF 50-55%, no RWMA, Gr 2 DD, mild to mod AI, PASP 32 mmHg  . History of nuclear stress test    Myoview 5/17 - EF 48%, no scar, no ischemia. Low Risk  . History of pneumonia    79 years old  . Hx of Bell's palsy    left face  . Hypercholesteremia    managed by dr Waynard Edwards  . Hypertension   . 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   . Lung nodules    noted on past CT scans  . Malaria   . SVT (supraventricular  tachycardia) (HCC)    remote episode during exercise test  . Thoracic aortic aneurysm [facility])    last scan in Jan 2013. Now measuring 5.0cm. Referred to Dr. Tyrone Sage  . Wears dentures    bottom  . Wears glasses    Past Surgical History:  Procedure Laterality Date  . ATRIAL FIBRILLATION ABLATION N/A 05/12/2019   Procedure: ATRIAL FIBRILLATION ABLATION;  Surgeon: Hillis Range, MD;  Location: [facility] INVASIVE CV LAB;  Service: Cardiovascular;  Laterality: N/A;  . CARDIOVERSION N/A 03/13/2019   Procedure: CARDIOVERSION;  Surgeon: Pricilla Riffle, MD;  Location: [facility] ENDOSCOPY;  Service: Cardiovascular;  Laterality: N/A;  . CARDIOVERSION N/A 04/15/2019    Procedure: CARDIOVERSION;  Surgeon: Vesta Mixer, MD;  Location: [facility] ENDOSCOPY;  Service: Cardiovascular;  Laterality: N/A;  . CARDIOVERSION N/A 06/16/2019   Procedure: CARDIOVERSION;  Surgeon: Chilton Si, MD;  Location: [facility] ENDOSCOPY;  Service: Cardiovascular;  Laterality: N/A;  . CERVICAL FUSION  03-2010   c2-c7  . COLONOSCOPY     x4  . CORONARY STENT PLACEMENT  2004  . dental implant     x 2  . ELBOW SURGERY  1997   lt and rt  . EYE SURGERY    . HIP PINNING,CANNULATED Right 05/11/2014   Procedure: CANNULATED HIP PINNING;  Surgeon: Sheral Apley, MD;  Location: [facility] OR;  Service: Orthopedics;  Laterality: Right;  . RETINAL DETACHMENT SURGERY  1975   lt  . rotator cuff surgery  1997   lt  . SHOULDER ARTHROSCOPY  2/15   right-got cardiac clearance-gsc  . TONSILLECTOMY    . TRIGGER FINGER RELEASE Left 10/12/2013   Procedure: LEFT LONG FINGER TRIGGER RELEASE;  Surgeon: Jodi Marble, MD;  Location: [facility];  Service: Orthopedics;  Laterality: Left;    Current Outpatient Medications  Medication Sig Dispense Refill  . apixaban (ELIQUIS) 5 MG TABS tablet Take 1 tablet (5 mg total) by mouth 2 (two) times daily. 60 tablet 8  . chlorthalidone (HYGROTON) 25 MG tablet Take 25 mg by mouth daily.     . clobetasol ointment (TEMOVATE) 0.05 % Apply 1 application topically 2 (two) times daily as needed (itchy scaly on legs).     . Coenzyme Q10 (COQ-10) 200 MG CAPS Take 200 mg by mouth every evening.    . colesevelam (WELCHOL) 625 MG tablet Take 3,750 mg by mouth daily with breakfast.    . ezetimibe (ZETIA) 10 MG tablet Take 10 mg by mouth daily.    . fluticasone (FLONASE) 50 MCG/ACT nasal spray Place 1 spray into both nostrils daily as needed for allergies (sinus congestion).    . Glucosamine HCl 1500 MG TABS Take 3,000 mg by mouth daily.    . hydrocortisone 2.5 % cream Apply 1 application topically as needed (eye brows flakey). Use on head    . Ivermectin (SOOLANTRA  EX) Apply 1 application topically daily as needed (Rosacea).     Marland Kitchen ketoconazole (NIZORAL) 2 % cream Apply 1 application topically daily as needed for irritation (Corner of mouth (sores)).    Marland Kitchen lisinopril (PRINIVIL,ZESTRIL) 40 MG tablet Take 40 mg by mouth every evening.     Marland Kitchen LIVALO 4 MG TABS Take 2 mg by mouth at bedtime.     . Multiple Vitamin (MULTIVITAMIN WITH MINERALS) TABS tablet Take 1 tablet by mouth daily.     . mupirocin ointment (BACTROBAN) 2 % Apply 1 application topically daily as needed (tick bites).    . nitroGLYCERIN (NITROSTAT) 0.4  MG SL tablet Place 1 tablet (0.4 mg total) under the tongue every 5 (five) minutes as needed for chest pain. 25 tablet 1  . omeprazole (PRILOSEC) 20 MG capsule Take 20 mg by mouth in the morning and at bedtime. BEFORE BREAKFAST & BEFORE SUPPER    . Vitamin D, Cholecalciferol, 25 MCG (1000 UT) CAPS Take 1,000 Units by mouth daily.    Marland Kitchen. amiodarone (PACERONE) 200 MG tablet Take 1 tablet (200 mg total) by mouth daily. 60 tablet 1   No current facility-administered medications for this encounter.    Allergies  Allergen Reactions  . Quinolones Hypertension    Patient was warned about not using Cipro and similar antibiotics. Recent studies have raised concern that fluoroquinolone antibiotics could be associated with an increased risk of aortic aneurysm Fluoroquinolones have non-antimicrobial properties that might jeopardise the integrity of the extracellular matrix of the vascular wall In a  propensity score matched cohort study in [place], there was a 66% increased rate of aortic aneurysm or dissection associated with oral fluoroquinolone use, compared wit  . Promethazine Hcl Other (See Comments)    Agitation and incoherence (phenergan)  . Vesicare [Solifenacin Succinate] Itching and Rash  . Atorvastatin     Other reaction(s): Myalgia  . Ezetimibe-Simvastatin     Other reaction(s): Joints    Social History   Socioeconomic History  . Marital  status: Married    Spouse name: Not on file  . Number of children: 1  . Years of education: Not on file  . Highest education level: Not on file  Occupational History  . Occupation: retired Advertising account executiveschool teacher    Employer: RETIRED  Tobacco Use  . Smoking status: Former Smoker    Quit date: 04/09/1969    Years since quitting: 51.2  . Smokeless tobacco: Current User    Types: Chew  . Tobacco comment: reports that when he smoked it was intermittent  Vaping Use  . Vaping Use: Unknown  Substance and Sexual Activity  . Alcohol use: Yes    Alcohol/week: 1.0 standard drink    Types: 1 Standard drinks or equivalent per week    Comment: occasional mainly during holiday  . Drug use: No  . Sexual activity: Yes  Other Topics Concern  . 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  Physical Exam: Vitals:   07/01/20 1006  BP: 122/82  Pulse: 70  Weight: 133.6 kg  Height: 6\' 7"  (2.007 m)    GEN- The patient is a well appearing elderly male, alert and oriented x 3 today.   HEENT-head normocephalic, atraumatic, sclera clear, conjunctiva pink, hearing intact, trachea midline. Lungs- Clear to ausculation bilaterally, normal work of breathing Heart- Regular rate and rhythm, no murmurs, rubs or gallops  GI- soft, NT, ND, + BS Extremities- no clubbing, cyanosis, or edema MS- no significant deformity or atrophy Skin- no rash or lesion Psych- euthymic mood, full affect Neuro- strength and sensation are intact   Wt Readings from Last 3 Encounters:  07/01/20 133.6 kg  06/25/20 129.3 kg  06/01/20 130.3 kg    EKG today demonstrates  SR, RBBB Vent. rate 70 BPM PR interval 178 ms QRS duration 122 ms QT/QTc 420/453 ms  Echo 02/26/19 demonstrated   1. Left ventricular ejection fraction, by visual estimation, is 50 to 55%. The left ventricle has normal function. There is moderately increased left ventricular hypertrophy.  2. Definity contrast agent was given IV to delineate the left ventricular endocardial borders.  3. Left ventricular diastolic function could not be evaluated.  4. Global right ventricle has normal systolic function.The right ventricular size is normal.  5. Left atrial size was normal.  6. Right atrial size was normal.  7. The mitral valve is normal in structure. Trace mitral valve regurgitation. No evidence of mitral stenosis.  8. The tricuspid valve is normal in structure. Tricuspid valve regurgitation is trivial.  9. The aortic valve is tricuspid. Aortic valve regurgitation is mild. Mild aortic valve sclerosis without stenosis. 10. The pulmonic valve was not well visualized. Pulmonic valve regurgitation is not visualized. 11. Aortic dilatation noted. 12. 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 13. The inferior vena cava is normal in size with greater than 50% respiratory variability, suggesting right atrial pressure of 3 mmHg. 14. Definity used; normal LV systolic function; moderate LVH; moderate to severe dilatation of aortic root/ascending aorta (5.2 cm; suggest CTA or MRA to further assess); mild AI.  LA 4.8 cm  Epic records are reviewed at length today  Assessment and Plan:  1. Persistent atrial fibrillation/atrial flutter S/p afib ablation 05/12/19 with Dr Johney Frame. Continue amiodarone 200 mg BID and then decrease to 200 mg daily on 07/03/20. Question if he would be a repeat ablation candidate.  Continue Eliquis 5 mg BID He has been intolerant of  BB in the past 2/2 bradycardia.  This patients CHA2DS2-VASc Score and unadjusted Ischemic Stroke Rate (% per year) is equal to 4.8 % stroke rate/year from a score of 4  Above score calculated as 1 point each if present [CHF, HTN, DM, Vascular=MI/PAD/Aortic Plaque, Age if 65-74, or Male] Above score calculated as 2 points each if present [Age > 75, or Stroke/TIA/TE]  2. Obesity Body mass index is 33.19 kg/m. Lifestyle modification was discussed and encouraged including regular physical activity and weight reduction.  3. HTN Stable, no changes today.  4. CAD S/p RCA stenting 2004. Patient denies any chest pain but admits he never had chest pain at the time of his PCI. He did have symptoms of lightheadedness as an anginal equivalent in 2004 and became lightheaded with heavy physical exertion this past weekend. He had a low risk stress test in 2017. Will have him follow up with his primary cardiologist to determine if ischemic eval is needed.     Follow up with Dr Johney Frame per recall. Follow up with Dr Gibson Ramp office.    [name] PA-C Af[facility] Clinic [facility] [address] [place], [place] [address] [phone] 07/01/2020 12:18 PM

## 2020-07-05 ENCOUNTER — Encounter: Payer: Self-pay | Admitting: *Deleted

## 2020-07-05 ENCOUNTER — Telehealth: Payer: Self-pay | Admitting: Cardiovascular Disease

## 2020-07-05 DIAGNOSIS — I2510 Atherosclerotic heart disease of native coronary artery without angina pectoris: Secondary | ICD-10-CM

## 2020-07-05 DIAGNOSIS — R0602 Shortness of breath: Secondary | ICD-10-CM

## 2020-07-05 NOTE — Addendum Note (Signed)
Addended by: Lendon Ka on: 07/05/2020 12:36 PM   Modules accepted: Orders

## 2020-07-05 NOTE — Telephone Encounter (Signed)
Pt c/o Shortness Of Breath: STAT if SOB developed within the last 24 hours or pt is noticeably SOB on the phone  1. Are you currently SOB (can you hear that pt is SOB on the phone)? Yes alittle  2. How long have you been experiencing SOB? Since last night patient was at the ER on Friday  3. Are you SOB when sitting or when up moving around? When he gets up in the mornings  4. Are you currently experiencing any other symptoms? Weak SOB

## 2020-07-05 NOTE — Addendum Note (Signed)
Addended by: Verne Carrow D on: 07/05/2020 02:06 PM   Modules accepted: Orders

## 2020-07-05 NOTE — Telephone Encounter (Signed)
I called patient to FU on call in for SOB.  He reports SOB and weakness that began about a week ago.  At which time he went to the ED 06/25/20.  His HR was 150's atrial flutter.  He then went to Afib clinic on 07/01/20.  He expresses that he was told he needed a ST and someone would call him Monday 07/04/20 to schedule.  I called Afib clinic myself to get clarification.  I was told that he was in NSR at visit and there was nothing else for them to do for him.  They referred him back to Dr. Clifton Emileo for possible ischemic evaluation.  I called patient back with this information and he expressed he must have misunderstood what he was told.  When asked about his activity intolerance he expresses that he can still mow his yard using a riding lawnmower. But he gets SOB with ambulation.  He can't use his weed eater d/t SOB. His last BP was 131/81 HR 59.  He has not missed any doses of eliquis and is taking amiodarone as scheduled.  I informed him that Dr.McAlhany will be in office this afternoon and I will message him with concerns.

## 2020-07-05 NOTE — Telephone Encounter (Signed)
We can set him up for a nuclear stress test (exercise if he thinks he can walk on a treadmill) and an echo and I can see him in several weeks, or have him seen by a care team APP. Thayer Ohm

## 2020-07-05 NOTE — Telephone Encounter (Signed)
Spoke with pt. Explained recommendation from Dr. Clifton Doniel for [facility] and echo. Pt aware he will be contacted to schedule.  He has a follow up scheduled with Tereso Newcomer, PA-C on 08/02/20.  Will move up/with Dr. Clifton Joah after test complete if there is availability.

## 2020-07-13 ENCOUNTER — Other Ambulatory Visit: Payer: Self-pay | Admitting: Cardiothoracic Surgery

## 2020-07-13 ENCOUNTER — Telehealth (HOSPITAL_COMMUNITY): Payer: Self-pay | Admitting: *Deleted

## 2020-07-13 DIAGNOSIS — I712 Thoracic aortic aneurysm, without rupture, unspecified: Secondary | ICD-10-CM

## 2020-07-13 NOTE — Telephone Encounter (Signed)
Patient given detailed instructions per Myocardial Perfusion Study Information Sheet for the test on 07/18/20 at 8:00. Patient notified to arrive 15 minutes early and that it is imperative to arrive on time for appointment to keep from having the test rescheduled.  If you need to cancel or reschedule your appointment, please call the office within 24 hours of your appointment. . Patient verbalized understanding.Ronald [name]

## 2020-07-15 ENCOUNTER — Other Ambulatory Visit (HOSPITAL_COMMUNITY)
Admission: RE | Admit: 2020-07-15 | Discharge: 2020-07-15 | Disposition: A | Discharge status: Home / Self Care | Payer: Medicare PPO | Source: Ambulatory Visit | Attending: Physician Assistant | Admitting: Physician Assistant

## 2020-07-15 DIAGNOSIS — Z01812 Encounter for preprocedural laboratory examination: Secondary | ICD-10-CM | POA: Insufficient documentation

## 2020-07-15 DIAGNOSIS — Z20822 Contact with and (suspected) exposure to covid-19: Secondary | ICD-10-CM | POA: Diagnosis not present

## 2020-07-16 LAB — SARS CORONAVIRUS 2 (TAT 6-24 HRS): SARS Coronavirus 2: NEGATIVE

## 2020-07-18 ENCOUNTER — Ambulatory Visit (HOSPITAL_COMMUNITY): Payer: Medicare PPO | Attending: Cardiology

## 2020-07-18 ENCOUNTER — Other Ambulatory Visit: Payer: Self-pay

## 2020-07-18 DIAGNOSIS — I2510 Atherosclerotic heart disease of native coronary artery without angina pectoris: Secondary | ICD-10-CM | POA: Insufficient documentation

## 2020-07-18 DIAGNOSIS — R0602 Shortness of breath: Secondary | ICD-10-CM | POA: Diagnosis not present

## 2020-07-18 LAB — MYOCARDIAL PERFUSION IMAGING
Estimated workload: 7 METS
Exercise duration (min): 6 min
Exercise duration (sec): 0 s
LV dias vol: 112 mL (ref 62–150)
LV sys vol: 47 mL
MPHR: 143 {beats}/min
Peak HR: 134 {beats}/min
Percent HR: 93 %
Rest HR: 70 {beats}/min
SDS: 1
SRS: 0
SSS: 1
TID: 0.98

## 2020-07-18 MED ORDER — TECHNETIUM TC 99M TETROFOSMIN IV KIT
10.2000 | PACK | Freq: Once | INTRAVENOUS | Status: AC | PRN
  Administered 2020-07-18: 10.2 via INTRAVENOUS
  Filled 2020-07-18: qty 11

## 2020-07-18 MED ORDER — TECHNETIUM TC 99M TETROFOSMIN IV KIT
30.9000 | PACK | Freq: Once | INTRAVENOUS | Status: AC | PRN
  Administered 2020-07-18: 30.9 via INTRAVENOUS
  Filled 2020-07-18: qty 31

## 2020-08-01 DIAGNOSIS — Z0100 Encounter for examination of eyes and vision without abnormal findings: Secondary | ICD-10-CM | POA: Diagnosis not present

## 2020-08-01 DIAGNOSIS — H43813 Vitreous degeneration, bilateral: Secondary | ICD-10-CM | POA: Diagnosis not present

## 2020-08-02 ENCOUNTER — Ambulatory Visit: Payer: Medicare PPO | Admitting: Physician Assistant

## 2020-08-05 ENCOUNTER — Other Ambulatory Visit: Payer: Self-pay

## 2020-08-05 ENCOUNTER — Ambulatory Visit (HOSPITAL_COMMUNITY): Payer: Medicare PPO | Attending: Cardiovascular Disease

## 2020-08-05 DIAGNOSIS — I2510 Atherosclerotic heart disease of native coronary artery without angina pectoris: Secondary | ICD-10-CM | POA: Insufficient documentation

## 2020-08-05 DIAGNOSIS — R0602 Shortness of breath: Secondary | ICD-10-CM | POA: Diagnosis not present

## 2020-08-05 LAB — ECHOCARDIOGRAM COMPLETE
Area-P 1/2: 2.13 cm2
S' Lateral: 3.72 cm

## 2020-08-15 NOTE — Progress Notes (Addendum)
Cardiology Office Note:    Date:  08/16/2020   ID:  KOTY ANCTIL, DOB 1940-10-11, MRN [id number]  PCP:  [name], MD   [facility] Providers Cardiologist:  Verne Carrow, MD     Referring MD: [name], MD   Chief Complaint:  Shortness of Breath and Atrial Flutter    Patient Profile:    Ronald [name] is a 80 y.o. male with:   Coronary artery disease    S/p stent to RCA in 2004  Myoview 4/22: low risk   Persistent atrial fibrillation  Failed DCCV x 2 and failed Amiodarone  S/p PVI ablation in 2/21   Mild Aortic Insufficiency   Echocardiogram 4/22  Hypertension   Hyperlipidemia   Intol of statins   Ascending thoracic aortic aneurysm   CT 5/21: 4.8 cm   PVCs  GERD   Prior CV studies: GATED SPECT MYO PERF W/EXERCISE STRESS 1D 07/18/2020 Narrative  The left ventricular ejection fraction is normal (55-65%).  Nuclear stress EF: 58%.  Blood pressure demonstrated a hypertensive response to exercise.  There was no ST segment deviation noted during stress.  The study is normal.  This is a low risk study.  Echocardiogram 08/05/20 EF 55-60, no RWMA, normal RVSF, mild AI, moderate to severe dilation of aortic root (51 mm), dilated ascending aorta (50 mm)  Chest CTA 09/03/19 Asc TAA 4.8 x 4.7 cm  Innumerable small pulmonary nodules  Echo 10/16 Mild LVH, EF 55-60%, normal wall motion, grade 1 diastolic dysfunction, aortic root 53 mm, ascending aorta 47 mm, moderate RAE, mild TR  LHC 3/13 LM okay LAD okay LCx okay RCA proximal stent patent, small caliber PDA 60% EF 50%  History of Present Illness: Ronald [name] was last seen by Dr. Clifton Lucille in 9/21.  He went to the ED in 3/22 with AFlutter with RVR.  He converted to NSR on Diltiazem and was placed back on Amiodarone.  He was seen in the AFib Clinic.  He had symptoms of shortness of breath and lightheadedness when he went to the ED.  He did not have chest pain when he required PCI.   Therefore he was set up for a Myoview and and echocardiogram and f/u with Cardiology.  The Myoview was low risk and the echocardiogram demonstrated normal LVF.  He returns for f/u.  He is here alone.  He notes that he has difficulty with his back and his knees which makes it hard to get around.  He has also not been as active over the past couple of years since COVID restrictions started.  He has been try to get back to normal activities and has noticed shortness of breath.  This seems to be improving.  He has not had orthopnea or significant leg edema.  He has not had syncope.  He has not had chest pain.        Past Medical History:  Diagnosis Date  . Arthritis   . Coronary artery disease    Stent 2004  . GERD (gastroesophageal reflux disease)   . History of echocardiogram    Echo 5/17: mild LVH, EF 50-55%, no RWMA, Gr 2 DD, mild to mod AI, PASP 32 mmHg  . History of nuclear stress test    Myoview 5/17 - EF 48%, no scar, no ischemia. Low Risk  . History of pneumonia    80 years old  . Hx of Bell's palsy    left face  . Hypercholesteremia    managed by  dr Waynard Edwards  . Hypertension   . 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   . Lung nodules    noted on past CT scans  . Malaria   . SVT (supraventricular tachycardia) (HCC)    remote episode during exercise test  . Thoracic aortic aneurysm [facility])    last scan in Jan 2013. Now measuring 5.0cm. Referred to Dr. Tyrone Sage  . Wears dentures    bottom  . Wears glasses     Current Medications: Current Meds  Medication Sig  . amiodarone (PACERONE) 200 MG tablet Take 1 tablet (200 mg total) by mouth daily.  Marland Kitchen apixaban (ELIQUIS) 5 MG TABS tablet Take 1 tablet (5 mg total) by mouth 2 (two) times daily.  . chlorthalidone (HYGROTON) 25 MG tablet Take 25 mg by mouth daily.   . clobetasol ointment (TEMOVATE) 0.05 % Apply 1 application topically 2 (two)  times daily as needed (itchy scaly on legs).   . Coenzyme Q10 (COQ-10) 200 MG CAPS Take 200 mg by mouth every evening.  . colesevelam (WELCHOL) 625 MG tablet Take 3,750 mg by mouth daily with breakfast.  . ezetimibe (ZETIA) 10 MG tablet Take 10 mg by mouth daily.  . fluticasone (FLONASE) 50 MCG/ACT nasal spray Place 1 spray into both nostrils daily as needed for allergies (sinus congestion).  . Glucosamine HCl 1500 MG TABS Take 3,000 mg by mouth daily.  . hydrocortisone 2.5 % cream Apply 1 application topically as needed (eye brows flakey). Use on head  . Ivermectin (SOOLANTRA EX) Apply 1 application topically daily as needed (Rosacea).   Marland Kitchen ketoconazole (NIZORAL) 2 % cream Apply 1 application topically daily as needed for irritation (Corner of mouth (sores)).  Marland Kitchen lisinopril (PRINIVIL,ZESTRIL) 40 MG tablet Take 40 mg by mouth every evening.   Marland Kitchen LIVALO 4 MG TABS Take 2 mg by mouth at bedtime.   . Multiple Vitamin (MULTIVITAMIN WITH MINERALS) TABS tablet Take 1 tablet by mouth daily.   . mupirocin ointment (BACTROBAN) 2 % Apply 1 application topically daily as needed (tick bites).  . nitroGLYCERIN (NITROSTAT) 0.4 MG SL tablet Place 1 tablet (0.4 mg total) under the tongue every 5 (five) minutes as needed for chest pain.  Marland Kitchen omeprazole (PRILOSEC) 20 MG capsule Take 20 mg by mouth in the morning and at bedtime. BEFORE BREAKFAST & BEFORE SUPPER  . Vitamin D, Cholecalciferol, 25 MCG (1000 UT) CAPS Take 1,000 Units by mouth daily.     Allergies:   Quinolones, Promethazine hcl, Vesicare [solifenacin succinate], Atorvastatin, and Ezetimibe-simvastatin   Social History   Tobacco Use  . Smoking status: Former Smoker    Quit date: 04/09/1969    Years since quitting: 51.3  . Smokeless tobacco: Current User    Types: Chew  . Tobacco comment: reports that when he smoked it was intermittent  Vaping Use  . Vaping Use: Unknown  Substance Use Topics  . Alcohol use: Yes    Alcohol/week: 1.0 standard drink     Types: 1 Standard drinks or equivalent per week    Comment: occasional mainly during holiday  . Drug use: No     Family Hx: The patient's family history includes Cancer (age of onset: 58) in his mother; Heart attack in his father; Heart disease in his father. There is no history of Hypertension or Stroke.  Review of Systems  Cardiovascular: Negative for claudication.  Gastrointestinal: Negative for hematochezia.  Genitourinary: Negative for hematuria.     EKGs/Labs/Other Test Reviewed:    EKG:  EKG is   ordered today.  The ekg ordered today demonstrates NSR, HR 63, left axis deviation, right bundle branch block, QTC 454, no change from prior tracing  Recent Labs: 06/25/2020: BUN 24; Creatinine, Ser 1.36; Hemoglobin 16.0; Platelets 248; Potassium 3.6; Sodium 136   Recent Lipid Panel Lab Results  Component Value Date/Time   CHOL 184 11/14/2010 12:21 PM   TRIG 143.0 11/14/2010 12:21 PM   HDL 48.60 11/14/2010 12:21 PM   CHOLHDL 4 11/14/2010 12:21 PM   LDLCALC 107 (H) 11/14/2010 12:21 PM   Labs obtained through [facility] - personally reviewed and interpreted: 11/09/19: Total cholesterol 129, HDL 45, LDL 61, triglycerides 117   Risk Assessment/Calculations:    CHA2DS2-VASc Score = 4  This indicates a 4.8% annual risk of stroke. The patient's score is based upon: CHF History: No HTN History: Yes Diabetes History: No Stroke History: No Vascular Disease History: Yes Age Score: 2 Gender Score: 0     Physical Exam:    VS:  BP 130/72   Pulse 93   Ht 6\' 7"  (2.007 m)   Wt 295 lb (133.8 kg)   SpO2 97%   BMI 33.23 kg/m     Wt Readings from Last 3 Encounters:  08/16/20 295 lb (133.8 kg)  07/18/20 294 lb (133.4 kg)  07/01/20 294 lb 9.6 oz (133.6 kg)     Constitutional:      Appearance: Healthy appearance. Not in distress.  Neck:     Vascular: JVD normal.  Pulmonary:     Effort: Pulmonary effort is normal.     Breath sounds: No wheezing. No rales.  Cardiovascular:      Normal rate. Regular rhythm. Normal S1. Normal S2.     Murmurs: There is no murmur.  Edema:    Peripheral edema absent.  Abdominal:     Palpations: Abdomen is soft.  Skin:    General: Skin is warm and dry.  Neurological:     General: No focal deficit present.     Mental Status: Alert and oriented to person, place and time.     Cranial Nerves: Cranial nerves are intact.          ASSESSMENT & PLAN:    1. Coronary artery disease involving native coronary artery of native heart without angina pectoris History of prior PCI to the RCA.  Cardiac catheterization 2013 with patent RCA stent.  Recent Myoview low risk.  He has noted symptoms of shortness of breath with exertion.  However, this seems to be related to deconditioning.  Overall, his symptoms are improving.  Recent echocardiogram demonstrated normal LV function.  At this point, I do not think he requires further cardiac work-up.  He is not on aspirin as he is on Apixaban.  Continue the pitavastatin, ezetimibe.  Follow-up in 6 months.  2. Persistent atrial fibrillation (HCC) 3. Atypical atrial flutter (HCC) History of PVI ablation in 2021.  He was recently seen in the emergency room with rapid atrial flutter and converted on diltiazem.  He is now on amiodarone.  This is managed by the atrial fibrillation clinic.  He is currently tolerating anticoagulation with Apixaban.  Creatinine stable and hemoglobin normal in 06/2020.  4. Essential hypertension Fair control.  He does eat out a lot.  I have asked him to monitor his salt.  Continue current dose of chlorthalidone, lisinopril.  5.  Pure hypercholesterolemia LDL optimal on most recent lab work.  Continue current Rx.      6. Thoracic aortic aneurysm 4.8 cm by CT in 5/21.  He is followed by CT surgery.  Dispo:  Return in about 6 months (around 02/16/2021) for Routine Follow Up with Dr. Clifton JamesMcAlhany.   Medication Adjustments/Labs and Tests Ordered: Current medicines are reviewed at length  with the patient today.  Concerns regarding medicines are outlined above.  Tests Ordered: Orders Placed This Encounter  Procedures  . EKG 12-Lead   Medication Changes: No orders of the defined types were placed in this encounter.   Signed, Tereso NewcomerScott Shauntelle Jamerson, PA-C  08/16/2020 5:45 PM    [facility] [address], [place], [place]  [address] Phone: [phone]; Fax: [phone]

## 2020-08-16 ENCOUNTER — Other Ambulatory Visit: Payer: Self-pay

## 2020-08-16 ENCOUNTER — Ambulatory Visit (INDEPENDENT_AMBULATORY_CARE_PROVIDER_SITE_OTHER): Payer: Medicare PPO | Admitting: Physician Assistant

## 2020-08-16 ENCOUNTER — Encounter: Payer: Self-pay | Admitting: Physician Assistant

## 2020-08-16 VITALS — BP 130/72 | HR 93 | Ht 79.0 in | Wt 295.0 lb

## 2020-08-16 DIAGNOSIS — I712 Thoracic aortic aneurysm, without rupture, unspecified: Secondary | ICD-10-CM

## 2020-08-16 DIAGNOSIS — I4891 Unspecified atrial fibrillation: Secondary | ICD-10-CM

## 2020-08-16 DIAGNOSIS — I10 Essential (primary) hypertension: Secondary | ICD-10-CM | POA: Diagnosis not present

## 2020-08-16 DIAGNOSIS — I4819 Other persistent atrial fibrillation: Secondary | ICD-10-CM

## 2020-08-16 DIAGNOSIS — I484 Atypical atrial flutter: Secondary | ICD-10-CM | POA: Diagnosis not present

## 2020-08-16 DIAGNOSIS — I2510 Atherosclerotic heart disease of native coronary artery without angina pectoris: Secondary | ICD-10-CM | POA: Diagnosis not present

## 2020-08-16 DIAGNOSIS — E7800 Pure hypercholesterolemia, unspecified: Secondary | ICD-10-CM

## 2020-08-16 NOTE — Patient Instructions (Signed)
Medication Instructions:  NO CHANGES *If you need a refill on your cardiac medications before your next appointment, please call your pharmacy*   Lab Work: NONE If you have labs (blood work) drawn today and your tests are completely normal, you will receive your results only by: Marland Kitchen MyChart Message (if you have MyChart) OR . A paper copy in the mail If you have any lab test that is abnormal or we need to change your treatment, we will call you to review the results.   Testing/Procedures: NONE   Follow-Up: At [facility], you and your health needs are our priority.  As part of our continuing mission to provide you with exceptional heart care, we have created designated Provider Care Teams.  These Care Teams include your primary Cardiologist (physician) and Advanced Practice Providers (APPs -  Physician Assistants and Nurse Practitioners) who all work together to provide you with the care you need, when you need it.  We recommend signing up for the patient portal called "MyChart".  Sign up information is provided on this After Visit Summary.  MyChart is used to connect with patients for Virtual Visits (Telemedicine).  Patients are able to view lab/test results, encounter notes, upcoming appointments, etc.  Non-urgent messages can be sent to your provider as well.   To learn more about what you can do with MyChart, go to [email or web address].    Your next appointment:   6 month(s)  The format for your next appointment:   In Person  Provider:   Verne Carrow, MD   Other Instructions NONE

## 2020-08-31 DIAGNOSIS — E785 Hyperlipidemia, unspecified: Secondary | ICD-10-CM | POA: Diagnosis not present

## 2020-08-31 DIAGNOSIS — E118 Type 2 diabetes mellitus with unspecified complications: Secondary | ICD-10-CM | POA: Diagnosis not present

## 2020-08-31 DIAGNOSIS — I2510 Atherosclerotic heart disease of native coronary artery without angina pectoris: Secondary | ICD-10-CM | POA: Diagnosis not present

## 2020-08-31 DIAGNOSIS — R269 Unspecified abnormalities of gait and mobility: Secondary | ICD-10-CM | POA: Diagnosis not present

## 2020-08-31 DIAGNOSIS — K219 Gastro-esophageal reflux disease without esophagitis: Secondary | ICD-10-CM | POA: Diagnosis not present

## 2020-08-31 DIAGNOSIS — D6869 Other thrombophilia: Secondary | ICD-10-CM | POA: Diagnosis not present

## 2020-08-31 DIAGNOSIS — I10 Essential (primary) hypertension: Secondary | ICD-10-CM | POA: Diagnosis not present

## 2020-08-31 DIAGNOSIS — I4891 Unspecified atrial fibrillation: Secondary | ICD-10-CM | POA: Diagnosis not present

## 2020-08-31 DIAGNOSIS — I712 Thoracic aortic aneurysm, without rupture: Secondary | ICD-10-CM | POA: Diagnosis not present

## 2020-09-08 ENCOUNTER — Ambulatory Visit
Admission: RE | Admit: 2020-09-08 | Discharge: 2020-09-08 | Disposition: A | Discharge status: Home / Self Care | Payer: Medicare PPO | Source: Ambulatory Visit | Attending: Cardiothoracic Surgery | Admitting: Cardiothoracic Surgery

## 2020-09-08 ENCOUNTER — Other Ambulatory Visit: Payer: Self-pay

## 2020-09-08 ENCOUNTER — Ambulatory Visit: Payer: Medicare PPO | Admitting: Cardiothoracic Surgery

## 2020-09-08 VITALS — BP 110/71 | HR 81 | Resp 20 | Ht 79.0 in | Wt 289.0 lb

## 2020-09-08 DIAGNOSIS — J8410 Pulmonary fibrosis, unspecified: Secondary | ICD-10-CM | POA: Diagnosis not present

## 2020-09-08 DIAGNOSIS — I712 Thoracic aortic aneurysm, without rupture, unspecified: Secondary | ICD-10-CM

## 2020-09-08 DIAGNOSIS — I7789 Other specified disorders of arteries and arterioles: Secondary | ICD-10-CM | POA: Diagnosis not present

## 2020-09-08 DIAGNOSIS — J9811 Atelectasis: Secondary | ICD-10-CM | POA: Diagnosis not present

## 2020-09-08 MED ORDER — IOPAMIDOL (ISOVUE-370) INJECTION 76%
75.0000 mL | Freq: Once | INTRAVENOUS | Status: AC | PRN
  Administered 2020-09-08: 75 mL via INTRAVENOUS

## 2020-09-08 NOTE — Progress Notes (Signed)
  Chief complaint: Surveillance of aneurysm  History of present illness: 80 year old man is followed for several years with an ascending aortic aneurysm.  This measures approximately 5 cm in maximal diameter.  Of note, the patient is 6 feet 7 inches tall.  He has been seen several times in the clinical setting for atrial fibrillation.  She symptoms of been lessened recently with amiodarone.  Active Ambulatory Problems    Diagnosis Date Noted  . CAD (coronary artery disease) 11/14/2010  . Thoracic aortic aneurysm (HCC) 11/14/2010  . Pulmonary nodules/lesions, multiple 12/03/2013  . Persistent atrial fibrillation (HCC) 03/16/2019  . Secondary hypercoagulable state (HCC) 03/16/2019  . Hematoma of groin 05/12/2019  . Atypical atrial flutter (HCC) 05/20/2020   Resolved Ambulatory Problems    Diagnosis Date Noted  . No Resolved Ambulatory Problems   Past Medical History:  Diagnosis Date  . Arthritis   . Coronary artery disease   . GERD (gastroesophageal reflux disease)   . History of echocardiogram   . History of nuclear stress test   . History of pneumonia   . Hx of Bell's palsy   . Hypercholesteremia   . Hypertension   . Ischemic heart disease   . Lung nodules   . Malaria   . SVT (supraventricular tachycardia) (HCC)   . Wears dentures   . Wears glasses   Physical exam: BP 110/71 (BP Location: Right Arm, Patient Position: Sitting)   Pulse 81   Resp 20   Ht 6\' 7"  (2.007 m)   Wt 131.1 kg   SpO2 95% Comment: RA  BMI 32.56 kg/m  Well-appearing no acute distress Clear to auscultation Regular rate and rhythm no murmur Pulses intact peripherally no peripheral edema  Imaging: I personally reviewed his available imaging studies from today which demonstrate stable ascending aortic aneurysm  Impression/plan: Very pleasant 80 year old man with a stable aneurysm.  If indexed for height, this is not very big at all.  Would recommend follow-up in 18 months with noncontrast  CT.  Koa Zoeller Z. 80, MD [phone]

## 2020-11-08 ENCOUNTER — Other Ambulatory Visit: Payer: Self-pay | Admitting: Cardiovascular Disease

## 2020-12-16 DIAGNOSIS — E118 Type 2 diabetes mellitus with unspecified complications: Secondary | ICD-10-CM | POA: Diagnosis not present

## 2020-12-16 DIAGNOSIS — Z125 Encounter for screening for malignant neoplasm of prostate: Secondary | ICD-10-CM | POA: Diagnosis not present

## 2020-12-16 DIAGNOSIS — E785 Hyperlipidemia, unspecified: Secondary | ICD-10-CM | POA: Diagnosis not present

## 2020-12-28 DIAGNOSIS — R0981 Nasal congestion: Secondary | ICD-10-CM | POA: Diagnosis not present

## 2020-12-28 DIAGNOSIS — Z1152 Encounter for screening for COVID-19: Secondary | ICD-10-CM | POA: Diagnosis not present

## 2020-12-28 DIAGNOSIS — J3089 Other allergic rhinitis: Secondary | ICD-10-CM | POA: Diagnosis not present

## 2020-12-28 DIAGNOSIS — R051 Acute cough: Secondary | ICD-10-CM | POA: Diagnosis not present

## 2020-12-29 DIAGNOSIS — Z23 Encounter for immunization: Secondary | ICD-10-CM | POA: Diagnosis not present

## 2020-12-29 DIAGNOSIS — I712 Thoracic aortic aneurysm, without rupture: Secondary | ICD-10-CM | POA: Diagnosis not present

## 2020-12-29 DIAGNOSIS — N401 Enlarged prostate with lower urinary tract symptoms: Secondary | ICD-10-CM | POA: Diagnosis not present

## 2020-12-29 DIAGNOSIS — E669 Obesity, unspecified: Secondary | ICD-10-CM | POA: Diagnosis not present

## 2020-12-29 DIAGNOSIS — E118 Type 2 diabetes mellitus with unspecified complications: Secondary | ICD-10-CM | POA: Diagnosis not present

## 2020-12-29 DIAGNOSIS — N529 Male erectile dysfunction, unspecified: Secondary | ICD-10-CM | POA: Diagnosis not present

## 2020-12-29 DIAGNOSIS — Z1331 Encounter for screening for depression: Secondary | ICD-10-CM | POA: Diagnosis not present

## 2020-12-29 DIAGNOSIS — R82998 Other abnormal findings in urine: Secondary | ICD-10-CM | POA: Diagnosis not present

## 2020-12-29 DIAGNOSIS — Z0000 Encounter for general adult medical examination without abnormal findings: Secondary | ICD-10-CM | POA: Diagnosis not present

## 2020-12-29 DIAGNOSIS — I10 Essential (primary) hypertension: Secondary | ICD-10-CM | POA: Diagnosis not present

## 2020-12-29 DIAGNOSIS — E785 Hyperlipidemia, unspecified: Secondary | ICD-10-CM | POA: Diagnosis not present

## 2020-12-29 DIAGNOSIS — D6869 Other thrombophilia: Secondary | ICD-10-CM | POA: Diagnosis not present

## 2020-12-29 DIAGNOSIS — Z1389 Encounter for screening for other disorder: Secondary | ICD-10-CM | POA: Diagnosis not present

## 2020-12-30 DIAGNOSIS — Z1212 Encounter for screening for malignant neoplasm of rectum: Secondary | ICD-10-CM | POA: Diagnosis not present

## 2021-01-09 ENCOUNTER — Other Ambulatory Visit: Payer: Self-pay | Admitting: Cardiovascular Disease

## 2021-01-09 NOTE — Telephone Encounter (Signed)
Prescription refill request for Eliquis received. Indication:Afib  Last office visit: 08/16/20 Alben Spittle)  Scr: 1.36 (06/25/20) Age: 80 Weight: 131.1kg  Appropriate dose and refill sent to requested pharmacy.

## 2021-01-18 DIAGNOSIS — M545 Low back pain, unspecified: Secondary | ICD-10-CM | POA: Diagnosis not present

## 2021-01-27 DIAGNOSIS — M545 Low back pain, unspecified: Secondary | ICD-10-CM | POA: Diagnosis not present

## 2021-01-29 IMAGING — CT CT HEART MORPH/PULM VEIN W/ CM & W/O CA SCORE
2 of 9 series · 6 of 20 positions shown, 7 images · IV contrast (APPLIED)
Comparison: CT chest of 08/21/2018
COMPARISON: Chest CTA 08/21/2018
COMPARISON: CT chest of 08/21/2018

Addendum:
EXAM:
OVER-READ INTERPRETATION  CT CHEST

The following report is an over-read performed by radiologist Dr.
over-read does not include interpretation of cardiac or coronary
anatomy or pathology. The coronary calcium score/coronary CTA
interpretation by the cardiologist is attached.
CLINICAL DATA: 78-year-old male with h/o CAD, atrial fibrillation
scheduled for a RF ablation.
Cardiac CT/CTA
TECHNIQUE: The patient was scanned on a Siemens Somatom scanner.

[Series 10: best diast · axial · 0.39mm/px · z∈[+1185,+1259]mm · 3 of 371 slices shown, 4 images]
[im 93/371  vessel]
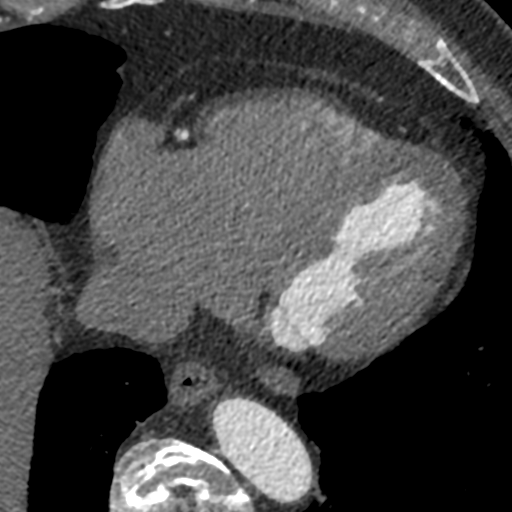
[im 93/371  lung]
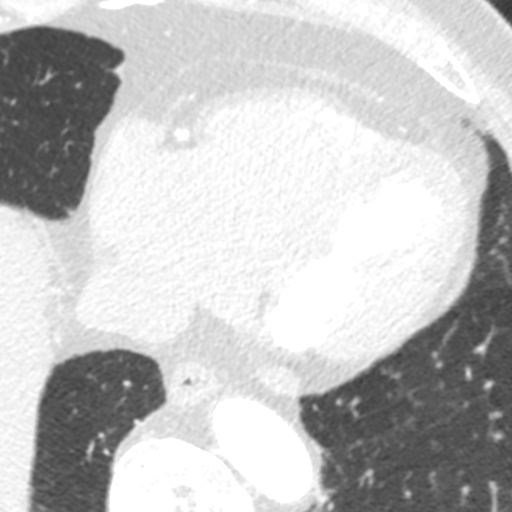
[im 186/371  vessel]
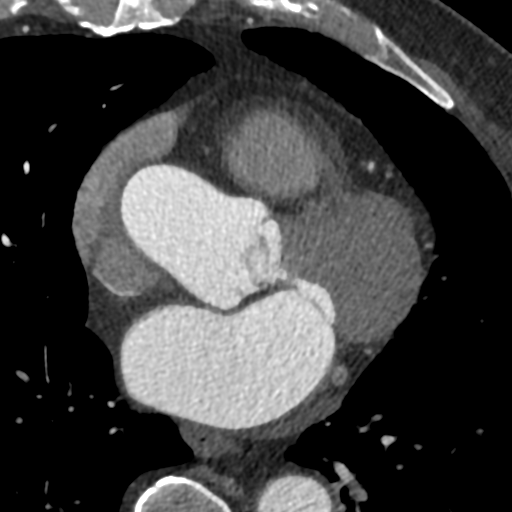
[im 278/371  vessel]
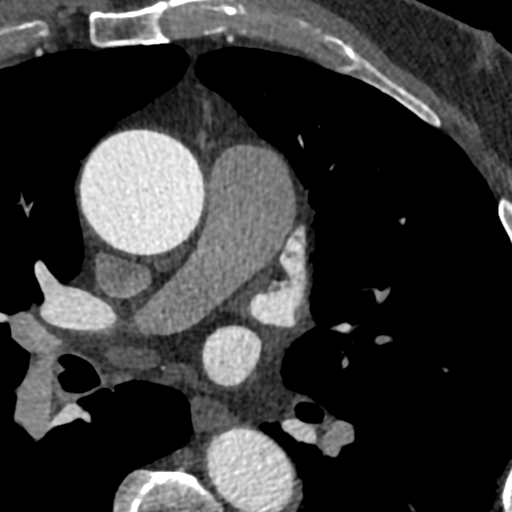

[Series 11: +300 ms · axial · 0.39mm/px · z∈[+1185,+1259]mm · 3 of 371 slices shown]
[im 93/371  vessel]
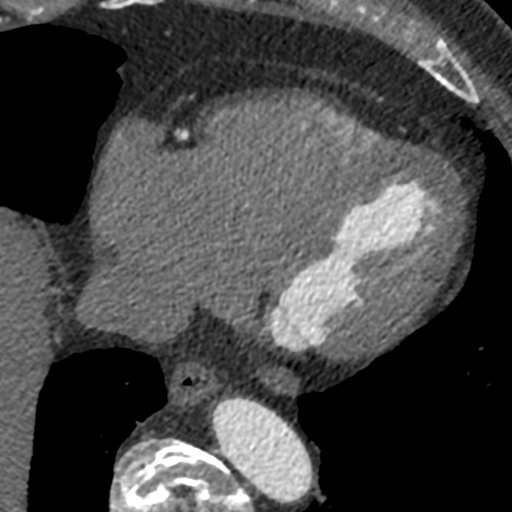
[im 186/371  vessel]
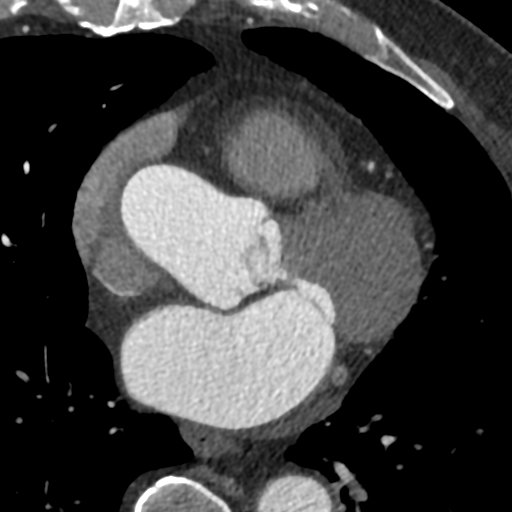
[im 278/371  vessel]
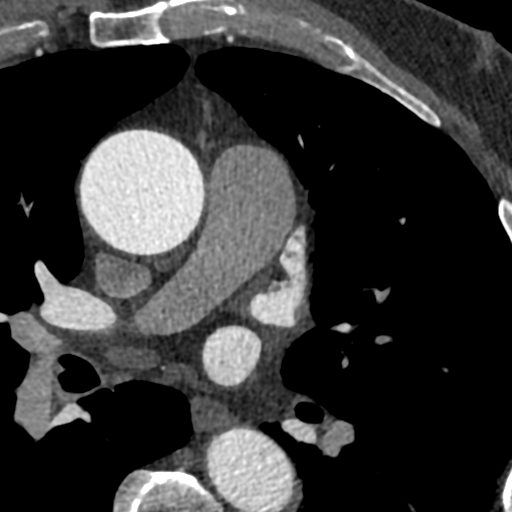

[6 of 20 positions shown; findings below may reference images not displayed]

FINDINGS: Vascular: Aorta at 4.9 cm greatest dimension along the ascending
portion of the thoracic aorta. Arch is incompletely imaged on
today's study. Visualized portions of the pulmonary arteries are
unremarkable.

Mediastinum/Nodes: Visualized portions of the esophagus are normal.

No signs of adenopathy within the limited area of the chest.

Lungs/Pleura: Small nodules at the right lung base. These are less
than 5 mm and are unchanged compared to previous imaging dating back
to at least [DATE].

Upper Abdomen: Incidental imaging of the upper abdomen is
unremarkable.

Musculoskeletal: Signs of spinal degenerative change, no acute or
destructive bone process.
IMPRESSION: 1. Aneurysmal dilatation of the ascending thoracic aorta at 4.9 cm.
Within 1 mm of its size in 8538. Ascending thoracic aortic aneurysm.
Recommend semi-annual imaging followup by CTA or MRA and referral to
cardiothoracic surgery if not already obtained. This recommendation
follows 3545 ACCF/AHA/AATS/ACR/ASA/SCA/DATIUS/JING-JING/CANLAS/NAOKO Guidelines
for the Diagnosis and Management of Patients With Thoracic Aortic
Disease. Circulation. 3545; 121: E266-e369. Aortic aneurysm NOS
(XC5KL-NGQ.6)
2. Small nodules at the right lung base are unchanged since 8690 and
are likely benign.
FINDINGS: A 120 kV prospective scan was triggered in the descending thoracic
aorta at 111 HU's. Gantry rotation speed was 280 msecs and
collimation was .9 mm. No beta blockade and no NTG was given. The 3D
data set was reconstructed in 5% intervals of the 60-80 % of the R-R
cycle. Diastolic phases were analyzed on a dedicated work station
using MPR, MIP and VRT modes. The patient received 80 cc of
contrast.

There is normal pulmonary vein drainage into the left atrium (2 on
the right and 2 on the left) with ostial measurements as follows:

RUPV: 23.5 x 18.0 mm

RLPV: 19.8 x 16.2 mm

LUPV: 19.5 x 17.5 mm

LLPV: 19.3 x 14.9 mm

The left atrial appendage is large with broccoli morphology and no
evidence for a thrombus.

The esophagus runs in the left atrial midline and is not in the
proximity to any of the pulmonary veins.

Aortic Valve:  Trileaflet. Trivial calcifications.

Coronary Arteries: Normal coronary origin. Right dominance. The
study was performed without use of NTG and insufficient for plaque
evaluation. There is a stent in the proximal RCA.

Aorta: Dilated aortic root at the sinuses level and level of the
ascending aorta, mild diffuse atherosclerotic plaque and
calcifications. No dissection.

Sinotubular Junction: 38 x 37 mm

Ascending Thoracic Aorta: 48 x 48 mm

Descending Thoracic Aorta: 29 x 29 mm

Sinus of Valsalva Measurements:

Non-coronary: 49 mm

Right -coronary: 47 mm

Left -coronary: 44 mm
IMPRESSION: 1. There is normal pulmonary vein drainage into the left atrium.

2. The left atrial appendage is large with broccoli morphology and
no evidence for a thrombus.

3. The esophagus runs in the left atrial midline and is not in the
proximity to any of the pulmonary veins.

4. Mildly dilated pulmonary artery measuring 34 mm suspicious of
pulmonary hypertension.

5. Dilated aortic root at the sinuses level. Moderately dilated
ascending aortic aneurysm with maximum diameter 48 mm that is stable
from 08/21/2018.

*** End of Addendum ***
EXAM:
OVER-READ INTERPRETATION  CT CHEST

The following report is an over-read performed by radiologist Dr.
over-read does not include interpretation of cardiac or coronary
anatomy or pathology. The coronary calcium score/coronary CTA
interpretation by the cardiologist is attached.
FINDINGS: Vascular: Aorta at 4.9 cm greatest dimension along the ascending
portion of the thoracic aorta. Arch is incompletely imaged on
today's study. Visualized portions of the pulmonary arteries are
unremarkable.

Mediastinum/Nodes: Visualized portions of the esophagus are normal.

No signs of adenopathy within the limited area of the chest.

Lungs/Pleura: Small nodules at the right lung base. These are less
than 5 mm and are unchanged compared to previous imaging dating back
to at least [DATE].

Upper Abdomen: Incidental imaging of the upper abdomen is
unremarkable.

Musculoskeletal: Signs of spinal degenerative change, no acute or
destructive bone process.
IMPRESSION: 1. Aneurysmal dilatation of the ascending thoracic aorta at 4.9 cm.
Within 1 mm of its size in 8538. Ascending thoracic aortic aneurysm.
Recommend semi-annual imaging followup by CTA or MRA and referral to
cardiothoracic surgery if not already obtained. This recommendation
follows 3545 ACCF/AHA/AATS/ACR/ASA/SCA/DATIUS/JING-JING/CANLAS/NAOKO Guidelines
for the Diagnosis and Management of Patients With Thoracic Aortic
Disease. Circulation. 3545; 121: [id number]. Aortic aneurysm NOS
(XC5KL-NGQ.6)
2. Small nodules at the right lung base are unchanged since 8690 and
are likely benign.

## 2021-02-01 DIAGNOSIS — M47816 Spondylosis without myelopathy or radiculopathy, lumbar region: Secondary | ICD-10-CM | POA: Diagnosis not present

## 2021-02-06 DIAGNOSIS — M47817 Spondylosis without myelopathy or radiculopathy, lumbosacral region: Secondary | ICD-10-CM | POA: Diagnosis not present

## 2021-02-07 ENCOUNTER — Emergency Department (HOSPITAL_COMMUNITY): Payer: Medicare PPO

## 2021-02-07 ENCOUNTER — Other Ambulatory Visit: Payer: Self-pay

## 2021-02-07 ENCOUNTER — Telehealth: Payer: Self-pay | Admitting: Cardiovascular Disease

## 2021-02-07 ENCOUNTER — Emergency Department (HOSPITAL_COMMUNITY)
Admission: EM | Admit: 2021-02-07 | Discharge: 2021-02-07 | Disposition: A | Discharge status: Home / Self Care | Payer: Medicare PPO | Attending: Emergency Medicine | Admitting: Emergency Medicine

## 2021-02-07 DIAGNOSIS — I2510 Atherosclerotic heart disease of native coronary artery without angina pectoris: Secondary | ICD-10-CM | POA: Insufficient documentation

## 2021-02-07 DIAGNOSIS — Z7901 Long term (current) use of anticoagulants: Secondary | ICD-10-CM | POA: Insufficient documentation

## 2021-02-07 DIAGNOSIS — R61 Generalized hyperhidrosis: Secondary | ICD-10-CM | POA: Insufficient documentation

## 2021-02-07 DIAGNOSIS — Z79899 Other long term (current) drug therapy: Secondary | ICD-10-CM | POA: Diagnosis not present

## 2021-02-07 DIAGNOSIS — R079 Chest pain, unspecified: Secondary | ICD-10-CM | POA: Diagnosis not present

## 2021-02-07 DIAGNOSIS — I4891 Unspecified atrial fibrillation: Secondary | ICD-10-CM | POA: Insufficient documentation

## 2021-02-07 DIAGNOSIS — K5730 Diverticulosis of large intestine without perforation or abscess without bleeding: Secondary | ICD-10-CM | POA: Diagnosis not present

## 2021-02-07 DIAGNOSIS — Z87891 Personal history of nicotine dependence: Secondary | ICD-10-CM | POA: Diagnosis not present

## 2021-02-07 DIAGNOSIS — I10 Essential (primary) hypertension: Secondary | ICD-10-CM | POA: Diagnosis not present

## 2021-02-07 DIAGNOSIS — R0602 Shortness of breath: Secondary | ICD-10-CM | POA: Insufficient documentation

## 2021-02-07 DIAGNOSIS — R0789 Other chest pain: Secondary | ICD-10-CM | POA: Diagnosis not present

## 2021-02-07 DIAGNOSIS — R112 Nausea with vomiting, unspecified: Secondary | ICD-10-CM | POA: Insufficient documentation

## 2021-02-07 LAB — CBC
HCT: 45.1 % (ref 39.0–52.0)
Hemoglobin: 14.7 g/dL (ref 13.0–17.0)
MCH: 31.1 pg (ref 26.0–34.0)
MCHC: 32.6 g/dL (ref 30.0–36.0)
MCV: 95.6 fL (ref 80.0–100.0)
Platelets: 208 10*3/uL (ref 150–400)
RBC: 4.72 MIL/uL (ref 4.22–5.81)
RDW: 12.9 % (ref 11.5–15.5)
WBC: 9.2 10*3/uL (ref 4.0–10.5)
nRBC: 0 % (ref 0.0–0.2)

## 2021-02-07 LAB — BASIC METABOLIC PANEL
Anion gap: 8 (ref 5–15)
BUN: 23 mg/dL (ref 8–23)
CO2: 28 mmol/L (ref 22–32)
Calcium: 9.4 mg/dL (ref 8.9–10.3)
Chloride: 103 mmol/L (ref 98–111)
Creatinine, Ser: 1.26 mg/dL — ABNORMAL HIGH (ref 0.61–1.24)
GFR, Estimated: 58 mL/min — ABNORMAL LOW (ref >60)
Glucose, Bld: 136 mg/dL — ABNORMAL HIGH (ref 70–99)
Potassium: 4.4 mmol/L (ref 3.5–5.1)
Sodium: 139 mmol/L (ref 135–145)

## 2021-02-07 LAB — TROPONIN I (HIGH SENSITIVITY)
Troponin I (High Sensitivity): 6 ng/L (ref <18)
Troponin I (High Sensitivity): 5 ng/L (ref <18)

## 2021-02-07 MED ORDER — IOHEXOL 350 MG/ML SOLN
100.0000 mL | Freq: Once | INTRAVENOUS | Status: AC | PRN
  Administered 2021-02-07: 100 mL via INTRAVENOUS

## 2021-02-07 NOTE — Telephone Encounter (Signed)
Pt c/o of Chest Pain: STAT if CP now or developed within 24 hours  1. Are you having CP right now?  No   2. Are you experiencing any other symptoms? (ex. SOB, nausea, vomiting, sweating)?  No   3. How long have you been experiencing CP?  Chest pain developed on 02/05/21 and it is mild   4. Is your CP continuous or coming and going?  Coming and going   5. Have you taken Nitroglycerin?  No  ?

## 2021-02-07 NOTE — ED Provider Notes (Signed)
[facility] EMERGENCY DEPARTMENT Provider Note   CSN: [id number] Arrival date & time: 02/07/21  1301     History Chief Complaint  Patient presents with   Chest Pain    Ronald [name] is a 80 y.o. male with a past medical history significant for CAD, thoracic aortic aneurysm followed by Dr. Vickey Sages, hypertension, A. fib on chronic Eliquis who presents to the ED due to intermittent, left-sided, dull chest pain x3 days.  Patient denies associated shortness of breath, nausea, vomiting, diaphoresis.  Pain is not worse with exertion.  No pleuritic nature.  Denies associated lower extremity edema.  Patient states he has been compliant with his Eliquis with no missed doses.  No recent illness.  Denies fever and chills.  Patient was advised by his cardiologist to report to the ED for further evaluation.  No treatment prior to arrival.  No aggravating or alleviating factors.  Chart reviewed.  Patient was evaluated by cardiology on 08/16/2020.  He had a recent Myoview which was low risk.  Patient has a scheduled cardiology appointment: 11/15.  Patient has not had a recent cardiothoracic surgery appointment with Dr. Vickey Sages on 09/08/2020.  Last CT scan showed stable aneurysm.  History obtained from patient and past medical records. No interpreter used during encounter.       Past Medical History:  Diagnosis Date   Arthritis    Coronary artery disease    Stent 2004   GERD (gastroesophageal reflux disease)    History of echocardiogram    Echo 5/17: mild LVH, EF 50-55%, no RWMA, Gr 2 DD, mild to mod AI, PASP 32 mmHg   History of nuclear stress test    Myoview 5/17 - EF 48%, no scar, no ischemia. 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     Lung nodules    noted on past CT scans   Malaria    SVT (supraventricular tachycardia) (HCC)    remote episode during exercise test   Thoracic aortic aneurysm [facility])    last scan in Jan 2013. Now measuring 5.0cm. Referred to Dr. Tyrone Sage   Wears dentures    bottom   Wears glasses     Patient Active Problem List   Diagnosis Date Noted   Atypical atrial flutter (HCC) 05/20/2020   Hematoma of groin 05/12/2019   Persistent atrial fibrillation (HCC) 03/16/2019   Secondary hypercoagulable state (HCC) 03/16/2019   Pulmonary nodules/lesions, multiple 12/03/2013   CAD (coronary artery disease) 11/14/2010   Thoracic aortic aneurysm 11/14/2010    Past Surgical History:  Procedure Laterality Date   ATRIAL FIBRILLATION ABLATION N/A 05/12/2019   Procedure: ATRIAL FIBRILLATION ABLATION;  Surgeon: Hillis Range, MD;  Location: [facility] INVASIVE CV LAB;  Service: Cardiovascular;  Laterality: N/A;   CARDIOVERSION N/A 03/13/2019   Procedure: CARDIOVERSION;  Surgeon: Pricilla Riffle, MD;  Location: [facility] ENDOSCOPY;  Service: Cardiovascular;  Laterality: N/A;   CARDIOVERSION N/A 04/15/2019   Procedure: CARDIOVERSION;  Surgeon: Vesta Mixer, MD;  Location: [facility] ENDOSCOPY;  Service: Cardiovascular;  Laterality: N/A;   CARDIOVERSION N/A 06/16/2019   Procedure: CARDIOVERSION;  Surgeon: Chilton Si, MD;  Location: [facility] ENDOSCOPY;  Service: Cardiovascular;  Laterality: N/A;   CERVICAL FUSION  03-2010   c2-c7   COLONOSCOPY     x4   CORONARY STENT PLACEMENT  2004   dental implant     x 2   ELBOW SURGERY  1997   lt and rt   EYE SURGERY     HIP PINNING,CANNULATED Right 05/11/2014   Procedure: CANNULATED HIP PINNING;  Surgeon: Renette Butters, MD;  Location: [place];  Service: Orthopedics;  Laterality: Right;   RETINAL DETACHMENT SURGERY  1975   lt   rotator cuff surgery  1997   lt   SHOULDER ARTHROSCOPY  2/15   right-got cardiac clearance-[facility]   TONSILLECTOMY     TRIGGER FINGER RELEASE Left 10/12/2013    Procedure: LEFT LONG FINGER TRIGGER RELEASE;  Surgeon: Jolyn Nap, MD;  Location: [place];  Service: Orthopedics;  Laterality: Left;       Family History  Problem Relation Age of Onset   Heart disease Father    Heart attack Father        84   Cancer Mother 58       LUNG   Hypertension Neg Hx    Stroke Neg Hx     Social History   Tobacco Use   Smoking status: Former   Smokeless tobacco: Current    Types: Chew   Tobacco comments:    reports that when he smoked it was intermittent  Vaping Use   Vaping Use: Unknown  Substance Use Topics   Alcohol use: Yes    Alcohol/week: 1.0 standard drink    Types: 1 Standard drinks or equivalent per week    Comment: occasional mainly during holiday   Drug use: No    Home Medications Prior to Admission medications   Medication Sig Start Date End Date Taking? Authorizing Provider  amiodarone (PACERONE) 200 MG tablet TAKE 1 TABLET BY MOUTH TWICE A DAY 11/08/20   Fenton, Clint R, PA  chlorthalidone (HYGROTON) 25 MG tablet Take 25 mg by mouth daily.     [provider]  clobetasol ointment (TEMOVATE) [id number] % Apply 1 application topically 2 (two) times daily as needed (itchy scaly on legs).     [provider]  Coenzyme Q10 (COQ-10) 200 MG CAPS Take 200 mg by mouth every evening.    [provider]  colesevelam (WELCHOL) 625 MG tablet Take 3,750 mg by mouth daily with breakfast.    [provider]  ELIQUIS 5 MG TABS tablet TAKE 1 TABLET BY MOUTH TWICE A DAY 01/09/21   Burnell Blanks, MD  ezetimibe (ZETIA) 10 MG tablet Take 10 mg by mouth daily.    [provider]  fluticasone (FLONASE) 50 MCG/ACT nasal spray Place 1 spray into both nostrils daily as needed for allergies (sinus congestion).    [provider]  Glucosamine HCl 1500 MG TABS Take 3,000 mg by mouth daily.    [provider]  hydrocortisone 2.5 % cream Apply 1 application topically as needed  (eye brows flakey). Use on head 07/09/14   [provider]  Ivermectin (SOOLANTRA EX) Apply 1 application topically daily as needed (Rosacea).     [provider]  ketoconazole (NIZORAL) 2 % cream Apply 1 application topically daily as needed for irritation (Corner of mouth (sores)).    [provider]  lisinopril (PRINIVIL,ZESTRIL) 40 MG tablet Take 40 mg by mouth every evening.     [provider]  LIVALO 4 MG  TABS Take 2 mg by mouth at bedtime.  02/24/19   [provider]  Multiple Vitamin (MULTIVITAMIN WITH MINERALS) TABS tablet Take 1 tablet by mouth daily.     [provider]  mupirocin ointment (BACTROBAN) 2 % Apply 1 application topically daily as needed (tick bites).    [provider]  nitroGLYCERIN (NITROSTAT) 0.4 MG SL tablet Place 1 tablet (0.4 mg total) under the tongue every 5 (five) minutes as needed for chest pain. 04/07/19   Burnell Blanks, MD  omeprazole (PRILOSEC) 20 MG capsule Take 20 mg by mouth in the morning and at bedtime. BEFORE BREAKFAST & BEFORE SUPPER    [provider]  Vitamin D, Cholecalciferol, 25 MCG (1000 UT) CAPS Take 1,000 Units by mouth daily. 02/17/09   [provider]    Allergies    Quinolones, Promethazine hcl, Vesicare [solifenacin succinate], Atorvastatin, and Ezetimibe-simvastatin  Review of Systems   Review of Systems  Constitutional:  Negative for chills and fever.  Respiratory:  Negative for shortness of breath.   Cardiovascular:  Positive for chest pain. Negative for palpitations and leg swelling.  Gastrointestinal:  Negative for abdominal pain, diarrhea, nausea and vomiting.  All other systems reviewed and are negative.  Physical Exam Updated Vital Signs BP (!) 149/92   Pulse (!) 57   Temp 97.8 F (36.6 C) (Oral)   Resp 15   SpO2 99%   Physical Exam Vitals and nursing note reviewed.  Constitutional:      General: He is not in acute distress.     Appearance: He is not ill-appearing.  HENT:     Head: Normocephalic.  Eyes:     Pupils: Pupils are equal, round, and reactive to light.  Cardiovascular:     Rate and Rhythm: Normal rate and regular rhythm.     Pulses: Normal pulses.     Heart sounds: Normal heart sounds. No murmur heard.   No friction rub. No gallop.  Pulmonary:     Effort: Pulmonary effort is normal.     Breath sounds: Normal breath sounds.  Abdominal:     General: Abdomen is flat. There is no distension.     Palpations: Abdomen is soft.     Tenderness: There is no abdominal tenderness. There is no guarding or rebound.  Musculoskeletal:        General: Normal range of motion.     Cervical back: Neck supple.     Comments: No lower extremity edema. No calf tenderness  Skin:    General: Skin is warm and dry.  Neurological:     General: No focal deficit present.     Mental Status: He is alert.  Psychiatric:        Mood and Affect: Mood normal.        Behavior: Behavior normal.    ED Results / Procedures / Treatments   Labs (all labs ordered are listed, but only abnormal results are displayed) Labs Reviewed  BASIC METABOLIC PANEL - Abnormal; Notable for the following components:      Result Value   Glucose, Bld 136 (*)    Creatinine, Ser 1.26 (*)    GFR, Estimated 58 (*)    All other components within normal limits  CBC  TROPONIN I (HIGH SENSITIVITY)  TROPONIN I (HIGH SENSITIVITY)    EKG EKG Interpretation  Date/Time:  Tuesday February 07 2021 13:45:31 EDT Ventricular Rate:  69 PR Interval:  188 QRS Duration: 120 QT Interval:  430 QTC Calculation:  460 R Axis:   -85 Text Interpretation: Normal sinus rhythm Left axis deviation Right bundle branch block Abnormal ECG Confirmed by Dene Gentry [phone]) on 02/07/2021 5:25:22 PM  Radiology DG Chest 2 View  Result Date: 02/07/2021 CLINICAL DATA:  Pt states chest pain x 1 day and worsened this morning EXAM: CHEST - 2 VIEW COMPARISON:  06/25/2020  FINDINGS: Lungs are clear. Heart size and mediastinal contours are within normal limits. Tortuous/ectatic thoracic aorta. No effusion.  No pneumothorax. Cervical fixation hardware. IMPRESSION: No acute cardiopulmonary disease. Electronically Signed   By: Lucrezia Europe M.D.   On: 02/07/2021 14:32   CT Angio Chest/Abd/Pel for Dissection W and/or Wo Contrast  Result Date: 02/07/2021 CLINICAL DATA:  Thoracic aortic aneurysm suspected. Worsening chest pain. EXAM: CT ANGIOGRAPHY CHEST, ABDOMEN AND PELVIS TECHNIQUE: Non-contrast CT of the chest was initially obtained. Multidetector CT imaging through the chest, abdomen and pelvis was performed using the standard protocol during bolus administration of intravenous contrast. Multiplanar reconstructed images and MIPs were obtained and reviewed to evaluate the vascular anatomy. CONTRAST:  133mL OMNIPAQUE IOHEXOL 350 MG/ML SOLN COMPARISON:  Chest radiograph dated 02/07/2021 and CT dated 09/08/2020. FINDINGS: CTA CHEST FINDINGS Cardiovascular: There is no cardiomegaly or pericardial effusion. Right coronary artery stent. Dilatation of the ascending aorta measuring up to 4.5 cm on the sagittal and coronal views and 4.8 cm on the axial images similar to prior CT. No dissection. No periaortic fluid collection the origins of the great vessels of the aortic arch appear patent as visualized. No pulmonary artery embolus identified. Mediastinum/Nodes: There is no hilar or mediastinal adenopathy. The esophagus is grossly unremarkable. No mediastinal fluid collection. Lungs/Pleura: There is a 5 mm right lower lobe pulmonary nodule (115/6). This was present on the prior CT. Interval resolution of the previously seen focal opacity at the right lung base. A 3 mm subpleural nodule noted in the superior segment of the right lower lobe (64/6). No focal consolidation. There is no pleural effusion pneumothorax. The central airways are patent. Musculoskeletal: Degenerative changes of the spine.  Partially visualized lower cervical ACDF. No acute osseous pathology. Review of the MIP images confirms the above findings. CTA ABDOMEN AND PELVIS FINDINGS VASCULAR Aorta: Mild atherosclerotic calcification. No aneurysmal dilatation or dissection. No periaortic fluid collection. Celiac: Patent without evidence of aneurysm, dissection, vasculitis or significant stenosis. SMA: Patent without evidence of aneurysm, dissection, vasculitis or significant stenosis. Renals: Both renal arteries are patent without evidence of aneurysm, dissection, vasculitis, fibromuscular dysplasia or significant stenosis. IMA: Patent without evidence of aneurysm, dissection, vasculitis or significant stenosis. Inflow: Patent without evidence of aneurysm, dissection, vasculitis or significant stenosis. Veins: No obvious venous abnormality within the limitations of this arterial phase study. Review of the MIP images confirms the above findings. NON-VASCULAR No intra-abdominal free air or free fluid. Hepatobiliary: No focal liver abnormality is seen. No gallstones, gallbladder wall thickening, or biliary dilatation. Pancreas: Unremarkable. No pancreatic ductal dilatation or surrounding inflammatory changes. Spleen: Normal in size without focal abnormality. Adrenals/Urinary Tract: Indeterminate 12 mm right adrenal nodule. The left adrenal glands unremarkable. The kidneys, visualized ureters, and urinary bladder appear unremarkable. Stomach/Bowel: There is sigmoid diverticulosis without active inflammatory changes. Moderate stool throughout the colon. There is no bowel obstruction or active inflammation. The appendix is normal. Lymphatic: No adenopathy. Reproductive: The prostate and seminal vesicles are grossly unremarkable. Other: None Musculoskeletal: Degenerative changes of the spine. Right femoral neck fixation screws. No acute osseous pathology. Several small sclerotic foci involving the iliac bones and L5,  nonspecific, possibly bone  islands. Review of the MIP images confirms the above findings. IMPRESSION: 1. No acute intrathoracic, abdominal, or pelvic pathology. 2. Dilatation of the ascending aorta similar to the CT of 09/08/2020. Follow-up as per recommendation of the prior CT. No CT evidence of aortic dissection. 3. Sigmoid diverticulosis. No bowel obstruction. Normal appendix. 4. Small right lower lobe pulmonary nodule similar to prior CT measuring up to 5 mm. No follow-up needed if patient is low-risk (and has no known or suspected primary neoplasm). Non-contrast chest CT can be considered in 12 months if patient is high-risk. This recommendation follows the consensus statement: Guidelines for Management of Incidental Pulmonary Nodules Detected on CT Images: From the Fleischner Society 2017; Radiology 2017; 284:228-243. 5. Aortic Atherosclerosis (ICD10-I70.0). Electronically Signed   By: Anner Crete M.D.   On: 02/07/2021 20:06    Procedures Procedures   Medications Ordered in ED Medications  iohexol (OMNIPAQUE) 350 MG/ML injection 100 mL (100 mLs Intravenous Contrast Given 02/07/21 1943)    ED Course  I have reviewed the triage vital signs and the nursing notes.  Pertinent labs & imaging results that were available during my care of the patient were reviewed by me and considered in my medical decision making (see chart for details).    MDM Rules/Calculators/A&P                          80 year old male presents to the ED due to left-sided dull chest pain x3 days.  Chest pain is not worse with exertion.  No pleuritic nature.  Patient has a history of a thoracic aortic aneurysm and is followed by Dr Orvan Seen with most recent CT scan demonstrating stable aneurysm.  Upon arrival, stable vitals.  Patient is afebrile, not tachycardic or hypoxic.  Patient in no acute distress.  Cardiac labs ordered at triage.  CT dissection study ordered to evaluate aneurysm.  Patient has been compliant with his Eliquis.  Low suspicion for  PE.  No clinical evidence of DVT on exam.  Delta troponin flat.  EKG demonstrates normal sinus rhythm.  Right bundle branch block.  No signs of acute ischemia.  Low suspicion for ACS. CBC unremarkable.  No leukocytosis.  BMP significant for mild elevation creatinine 1.26 and hyperglycemia 136.  Chest x-ray personally reviewed which is negative for signs of pneumonia, pneumothorax, or widened mediastinum.  CTA dissection study negative for any acute abnormalities. Stable aneurysm.  Stable pulmonary nodule.  Patient made aware of pulmonary nodule and need to follow-up with PCP for further evaluation.  No major PE. Low suspicion for PE/DVT. Offered admission for chest pain rule out however, patient states he would prefer to go home with cardiology follow-up which I find to be reasonable.  Advised patient to follow-up with his cardiologist within the next few days. Strict ED precautions discussed with patient. Patient states understanding and agrees to plan. Patient discharged home in no acute distress and stable vitals  Discussed with Dr. Francia Greaves who evaluated patient at bedside and agrees with assessment and plan.  Final Clinical Impression(s) / ED Diagnoses Final diagnoses:  Nonspecific chest pain    Rx / DC Orders ED Discharge Orders     None        Karie Kirks 02/07/21 2035    Valarie Merino, MD 02/07/21 2259

## 2021-02-07 NOTE — ED Notes (Signed)
PT to CT.

## 2021-02-07 NOTE — Discharge Instructions (Addendum)
It was a pleasure taking care of you today.  As discussed, all of your labs were reassuring.  Your cardiac markers were normal.  Your CT scan showed a stable aneurysm.  It also showed a stable pulmonary nodule.  Please follow-up with PCP for further evaluation of the pulmonary nodule.  Please follow-up with your cardiologist within 1 week for further evaluation of your chest pain.  Return to the ER for new or worsening symptoms.

## 2021-02-07 NOTE — ED Notes (Signed)
Patient transported to CT 

## 2021-02-07 NOTE — Telephone Encounter (Signed)
Called patient.  He is out eating w his wife.  He is having CP now.  Left sided.  He said its not terrible but it is different than anything he's ever felt.  He does not notice that anything makes it worse.  It gets better when he sits down.  His heart is not pounding.  Does not feel irregular.  He has weakness and clamminess when he is out of rhythm.  Not experiencing this now.  Only chest pain.  Has not taken ntg and does not have any w him.  I adv him to go to [facility] ER for evaluation of his chest pain.  His stent was 15 years ago.  He did not get this pain then either.  His wife is going to drive him to the ER now.

## 2021-02-07 NOTE — ED Triage Notes (Signed)
Pt from home for eval of progressively worsening chest pain x 2 days. Pain is constantly dull but does intermittently become more severe. Denies shob, n/v, or dizziness. Hx stent placement, several cardioversions, and an ablation.

## 2021-02-08 DIAGNOSIS — M47816 Spondylosis without myelopathy or radiculopathy, lumbar region: Secondary | ICD-10-CM | POA: Diagnosis not present

## 2021-02-16 DIAGNOSIS — J029 Acute pharyngitis, unspecified: Secondary | ICD-10-CM | POA: Diagnosis not present

## 2021-02-16 DIAGNOSIS — R509 Fever, unspecified: Secondary | ICD-10-CM | POA: Diagnosis not present

## 2021-02-16 DIAGNOSIS — R058 Other specified cough: Secondary | ICD-10-CM | POA: Diagnosis not present

## 2021-02-16 DIAGNOSIS — J101 Influenza due to other identified influenza virus with other respiratory manifestations: Secondary | ICD-10-CM | POA: Diagnosis not present

## 2021-02-16 DIAGNOSIS — R0981 Nasal congestion: Secondary | ICD-10-CM | POA: Diagnosis not present

## 2021-02-16 DIAGNOSIS — Z1152 Encounter for screening for COVID-19: Secondary | ICD-10-CM | POA: Diagnosis not present

## 2021-02-22 ENCOUNTER — Other Ambulatory Visit: Payer: Self-pay

## 2021-02-22 ENCOUNTER — Encounter: Payer: Self-pay | Admitting: Cardiovascular Disease

## 2021-02-22 ENCOUNTER — Ambulatory Visit: Payer: Medicare PPO | Admitting: Cardiovascular Disease

## 2021-02-22 VITALS — BP 108/68 | HR 71 | Ht 79.0 in | Wt 279.6 lb

## 2021-02-22 DIAGNOSIS — I351 Nonrheumatic aortic (valve) insufficiency: Secondary | ICD-10-CM

## 2021-02-22 DIAGNOSIS — I4819 Other persistent atrial fibrillation: Secondary | ICD-10-CM

## 2021-02-22 DIAGNOSIS — E7800 Pure hypercholesterolemia, unspecified: Secondary | ICD-10-CM | POA: Diagnosis not present

## 2021-02-22 DIAGNOSIS — I10 Essential (primary) hypertension: Secondary | ICD-10-CM | POA: Diagnosis not present

## 2021-02-22 DIAGNOSIS — I2510 Atherosclerotic heart disease of native coronary artery without angina pectoris: Secondary | ICD-10-CM | POA: Diagnosis not present

## 2021-02-22 DIAGNOSIS — I7121 Aneurysm of the ascending aorta, without rupture: Secondary | ICD-10-CM

## 2021-02-22 NOTE — Patient Instructions (Signed)
Medication Instructions:  No changes *If you need a refill on your cardiac medications before your next appointment, please call your pharmacy*  Lab Work: none If you have labs (blood work) drawn today and your tests are completely normal, you will receive your results only by: . MyChart Message (if you have MyChart) OR . A paper copy in the mail If you have any lab test that is abnormal or we need to change your treatment, we will call you to review the results.  Testing/Procedures: none  Follow-Up: At [facility], you and your health needs are our priority.  As part of our continuing mission to provide you with exceptional heart care, we have created designated Provider Care Teams.  These Care Teams include your primary Cardiologist (physician) and Advanced Practice Providers (APPs -  Physician Assistants and Nurse Practitioners) who all work together to provide you with the care you need, when you need it.  Your next appointment:   6 month(s)  The format for your next appointment:   In Person  Provider:   Christopher McAlhany, MD  Other Instructions   

## 2021-02-22 NOTE — Progress Notes (Signed)
Chief Complaint  Patient presents with   Follow-up    CAD, atrial fib    History of Present Illness: 80 yo male with h/o CAD s/p stent RCA 2004, HTN, HLD, thoracic aortic aneurysm and persistent atrial fibrillation here today for cardiac follow up. He has a thoracic aortic aneurysm and is followed yearly by CT surgery with yearly chest CT scans. He is known to have CAD and had a stent placed in the RCA in 2004. Last cardiac cath in 2013 with mild disease in the LAD and Circumflex, patent stent proximal RCA and moderate disease in the small PDA. He has not tolerated statins due to muscle aches or beta blockers secondary to bradycardia. Nuclear stress test 08/16/15 with no ischemia. Echo May 2017 with normal LV systolic function, mild to moderate AI. New onset atrial fibrillation November 2020. He was started on Eliquis. Echo November 2020 with LVEF=50-55%, mild AI. He was cardioverted to sinus 03/13/19 but converted back to atrial fib. He was started on amiodarone and had repeat cardioversion 04/15/19 but did not maintain sinus rhythm. He is now followed in the atrial fibrillation clinic by Dr. Rayann Heman and is post AF ablation in February 2021. Cardioversion in March 2021. He was cardioverted from atrial flutter in the ED March 2022. Low risk nuclear stress test April 2022. Echo April 2022 with LVEF=55-60%, mild AI. Dilated aortic root. He was seen in the ED 02/07/21 with chest pain. Troponin negative. Chest CTA 02/07/21 with no changes in the size of his ascending aorta. No evidence of aortic dissection.   He is here today for follow up. The patient denies any dyspnea, palpitations, lower extremity edema, orthopnea, PND, dizziness, near syncope or syncope. No chest pain over the past 2 weeks. He and his wife both had the flu last week and are now recovering. He still has some dizziness since he had the flu.   Primary Care Physician: Crist Infante, MD  Past Medical History:  Diagnosis Date   Arthritis     Coronary artery disease    Stent 2004   GERD (gastroesophageal reflux disease)    History of echocardiogram    Echo 5/17: mild LVH, EF 50-55%, no RWMA, Gr 2 DD, mild to mod AI, PASP 32 mmHg   History of nuclear stress test    Myoview 5/17 - EF 48%, no scar, no ischemia. Low Risk   History of pneumonia    80 years old   Hx of Bell's palsy    left face   Hypercholesteremia    managed by dr Joylene Draft   Hypertension    Ischemic heart disease    He had a stent in his right coronary artery in 2004; He does have a dual ostium of the left coronary system.    Lung nodules    noted on past CT scans   Malaria    SVT (supraventricular tachycardia) (HCC)    remote episode during exercise test   Thoracic aortic aneurysm    last scan in Jan 2013. Now measuring 5.0cm. Referred to Dr. Servando Snare   Wears dentures    bottom   Wears glasses     Past Surgical History:  Procedure Laterality Date   ATRIAL FIBRILLATION ABLATION N/A 05/12/2019   Procedure: ATRIAL FIBRILLATION ABLATION;  Surgeon: Thompson Grayer, MD;  Location: [place] [facility];  Service: Cardiovascular;  Laterality: N/A;   CARDIOVERSION N/A 03/13/2019   Procedure: CARDIOVERSION;  Surgeon: Fay Records, MD;  Location: [place];  Service:  Cardiovascular;  Laterality: N/A;   CARDIOVERSION N/A 04/15/2019   Procedure: CARDIOVERSION;  Surgeon: Acie Fredrickson Wonda Cheng, MD;  Location: [facility];  Service: Cardiovascular;  Laterality: N/A;   CARDIOVERSION N/A 06/16/2019   Procedure: CARDIOVERSION;  Surgeon: Skeet Latch, MD;  Location: [place];  Service: Cardiovascular;  Laterality: N/A;   CERVICAL FUSION  03-2010   c2-c7   COLONOSCOPY     x4   CORONARY STENT PLACEMENT  2004   dental implant     x 2   ELBOW SURGERY  1997   lt and rt   EYE SURGERY     HIP PINNING,CANNULATED Right 05/11/2014   Procedure: CANNULATED HIP PINNING;  Surgeon: Renette Butters, MD;  Location: [place];  Service: Orthopedics;  Laterality: Right;   RETINAL  DETACHMENT SURGERY  1975   lt   rotator cuff surgery  1997   lt   SHOULDER ARTHROSCOPY  2/15   right-got cardiac clearance-gsc   TONSILLECTOMY     TRIGGER FINGER RELEASE Left 10/12/2013   Procedure: LEFT LONG FINGER TRIGGER RELEASE;  Surgeon: Jolyn Nap, MD;  Location: [place];  Service: Orthopedics;  Laterality: Left;    Current Outpatient Medications  Medication Sig Dispense Refill   amiodarone (PACERONE) 200 MG tablet TAKE 1 TABLET BY MOUTH TWICE A DAY 60 tablet 1   chlorthalidone (HYGROTON) 25 MG tablet Take 25 mg by mouth daily.      clobetasol ointment (TEMOVATE) AB-123456789 % Apply 1 application topically 2 (two) times daily as needed (itchy scaly on legs).      Coenzyme Q10 (COQ-10) 200 MG CAPS Take 200 mg by mouth every evening.     colesevelam (WELCHOL) 625 MG tablet Take 3,750 mg by mouth daily with breakfast.     ELIQUIS 5 MG TABS tablet TAKE 1 TABLET BY MOUTH TWICE A DAY 60 tablet 8   ezetimibe (ZETIA) 10 MG tablet Take 10 mg by mouth daily.     fluticasone (FLONASE) 50 MCG/ACT nasal spray Place 1 spray into both nostrils daily as needed for allergies (sinus congestion).     Glucosamine HCl 1500 MG TABS Take 3,000 mg by mouth daily.     hydrocortisone 2.5 % cream Apply 1 application topically as needed (eye brows flakey). Use on head     Ivermectin (SOOLANTRA EX) Apply 1 application topically daily as needed (Rosacea).      ketoconazole (NIZORAL) 2 % cream Apply 1 application topically daily as needed for irritation (Corner of mouth (sores)).     lisinopril (PRINIVIL,ZESTRIL) 40 MG tablet Take 40 mg by mouth every evening.      LIVALO 4 MG TABS Take 2 mg by mouth at bedtime.      Multiple Vitamin (MULTIVITAMIN WITH MINERALS) TABS tablet Take 1 tablet by mouth daily.      mupirocin ointment (BACTROBAN) 2 % Apply 1 application topically daily as needed (tick bites).     nitroGLYCERIN (NITROSTAT) 0.4 MG SL tablet Place 1 tablet (0.4 mg total) under the tongue  every 5 (five) minutes as needed for chest pain. 25 tablet 1   omeprazole (PRILOSEC) 20 MG capsule Take 20 mg by mouth in the morning and at bedtime. BEFORE BREAKFAST & BEFORE SUPPER     Vitamin D, Cholecalciferol, 25 MCG (1000 UT) CAPS Take 1,000 Units by mouth daily.     No current facility-administered medications for this visit.    Allergies  Allergen Reactions   Quinolones Hypertension    Patient  was warned about not using Cipro and similar antibiotics. Recent studies have raised concern that fluoroquinolone antibiotics could be associated with an increased risk of aortic aneurysm Fluoroquinolones have non-antimicrobial properties that might jeopardise the integrity of the extracellular matrix of the vascular wall In a  propensity score matched cohort study in [place], there was a 66% increased rate of aortic aneurysm or dissection associated with oral fluoroquinolone use, compared wit   Promethazine Hcl Other (See Comments)    Agitation and incoherence (phenergan)   Vesicare [Solifenacin Succinate] Itching and Rash   Atorvastatin     Other reaction(s): Myalgia   Ezetimibe-Simvastatin     Other reaction(s): Joints    Social History   Socioeconomic History   Marital status: Married    Spouse name: Not on file   Number of children: 1   Years of education: Not on file   Highest education level: Not on file  Occupational History   Occupation: retired Advertising account executive: RETIRED  Tobacco Use   Smoking status: Former   Smokeless tobacco: Current    Types: Chew   Tobacco comments:    reports that when he smoked it was intermittent  Vaping Use   Vaping Use: Unknown  Substance and Sexual Activity   Alcohol use: Yes    Alcohol/week: 1.0 standard drink    Types: 1 Standard drinks or equivalent per week    Comment: occasional mainly during holiday   Drug use: No   Sexual activity: Yes  Other Topics Concern   Not on file  Social History Narrative   Lives in  [place]   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   BP 108/68   Pulse 71   Ht 6\' 7"  (2.007 m)   Wt 279 lb 9.6 oz (126.8 kg)   SpO2 99%   BMI 31.50 kg/m   Physical Examination:  General: Well developed, well nourished, NAD  HEENT: OP clear, mucus membranes moist  SKIN: warm, dry. No rashes. Neuro: No focal deficits  Musculoskeletal: Muscle strength 5/5 all ext  Psychiatric: Mood and affect normal  Neck: No JVD, no carotid bruits, no thyromegaly, no lymphadenopathy.  Lungs:Clear bilaterally, no wheezes, rhonci, crackles Cardiovascular: Regular rate and rhythm. No murmurs, gallops or rubs. Abdomen:Soft. Bowel sounds present. Non-tender.  Extremities: No lower extremity edema. Pulses are 2 + in the bilateral DP/PT.  Cardiac cath 06/08/11: Left main: There are separate ostia for the LAD and Circumflex.  Left Anterior Descending Artery: Large vessel that courses to the apex. No obstructive disease noted.Small diagonal branch.  Circumflex Artery: Large caliber vessel with no obstructive disease noted.  Right Coronary Artery: Very large, dominant vessel with patent proximal stent. There is mild plaque disease in the mid and distal RCA.  The small caliber PDA has 60% stenosis.  Left Ventricular Angiogram: Dilated LV. LVEF=50%.  Aortic root: Dilated aortic root and ascending aorta.  Impression:  1. Single vessel CAD with  patent stent RCA  2. Preserved LV systolic function  3. 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   Echo April 2022:  1. Left ventricular ejection fraction, by estimation, is 55 to 60%. The  left ventricle has normal function. The left ventricle has no regional  wall motion abnormalities. The left ventricular internal cavity size was  borderline dilated (adjusted for body   size). Left ventricular diastolic parameters are indeterminate.   2. Right ventricular systolic function is normal. The right ventricular  size is normal. Tricuspid regurgitation signal is inadequate for assessing  PA pressure.   3. The mitral valve is normal in structure. No evidence of mitral valve  regurgitation.   4. The aortic valve is tricuspid. Aortic valve regurgitation is mild.   5. Aortic dilatation noted. There is moderate to severe dilatation of the  aortic root, measuring 51 mm and of the ascending aorta, measuring 50 mm.   EKG:  EKG is not ordered today. The ekg ordered today demonstrates   Recent Labs: 02/07/2021: BUN 23; Creatinine, Ser 1.26; Hemoglobin 14.7; Platelets 208; Potassium 4.4; Sodium 139   Lipid Panel Followed in primary care   Wt Readings from Last 3 Encounters:  02/22/21 279 lb 9.6 oz (126.8 kg)  09/08/20 289 lb (131.1 kg)  08/16/20 295 lb (133.8 kg)     Other studies Reviewed: Additional studies/ records that were reviewed today include: . Review of the above records demonstrates:    Assessment and Plan:   1. CAD without angina: No chest pain. Last cath in 2013 with patent stent RCA, mild disease in the LAD and Circumflex. Stress test April 2022 with no ischemia. Continue ASA and statin. No beta blocker due to bradycardia.    2. Thoracic Aortic aneurysm: Followed in the CT surgery clinic. Chest CTA 02/07/21 with stable dilated ascending aorta.   3. HTN: BP is controlled. BP reported lower at home at times over the past few weeks following the flu. Will have him  cut back on Chlorthalidone to 12.5 mg daily for 10 days and increase his po intake.   4. HLD: Lipids followed in primary care and well controlled. Continue statin and Zetia.   5. Aortic Valve disease: Mild AI by echo in April 2022.   6. Persistent atrial fibrillation: He appears to be in sinus today. Followed in atrial fib clinic. Continue amiodarone and Eliquis.    Current medicines are reviewed at length with the patient today.  The patient does not have concerns regarding medicines.  The following changes have been made:  no change  Labs/ tests ordered today include:   No orders of the defined types were placed in this encounter.   Disposition:   F/U with me in 12 months  Signed, Verne Carrow, MD 02/22/2021 4:52 PM    [facility] [address] [place], [place], [place]  [address] Phone: [phone]; Fax: [phone]

## 2021-03-08 ENCOUNTER — Other Ambulatory Visit (HOSPITAL_COMMUNITY): Payer: Self-pay | Admitting: Physician Assistant

## 2021-03-19 NOTE — Progress Notes (Signed)
Cardiology Office Note Date:  03/20/2021  Patient ID:  Ronald [name], Ronald [name] October 05, 1940, MRN [id number] PCP:  Ronald Infante, MD  Cardiologist:  Ronald [name] Electrophysiologist: Ronald [name]    Chief Complaint:  6 mo  History of Present Illness: Ronald [name] is a 80 y.o. male with history of CAD (PCI 2004, Bells Palsy, HTN, HLD, SVT, AFib, thoracis aortic aneurysm.  He comes in today to be seen for Ronald [name], last seen by him Nov 2021, doing well, no changes were made.  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  BP at times low his clorthalidone reduced for a few days and instructed to keep better hydrated.   TODAY He is not entirely sure what today's visit is for, was called and told he heeded to come in to see a PA  He denies any new symptoms or concerns since seeing Ronald [name] No bleeding or signs of bleeding He does not think he has had any AF    Afib hx Diagnosed Nov 2020 PVI ablation  AAD hx Amiodarone Dec 2020 stopped May 2021 post ablation Amiodarone restarted Feb 2022 developed atypical AFlutter  Past Medical History:  Diagnosis Date   Arthritis    Coronary artery disease    Stent 2004   GERD (gastroesophageal reflux disease)    History of echocardiogram    Echo 5/17: mild LVH, EF 50-55%, no RWMA, Gr 2 DD, mild to mod AI, PASP 32 mmHg   History of nuclear stress test    Myoview 5/17 - EF 48%, no scar, no ischemia. Low Risk   History of pneumonia    80 years old   Hx of Bell's palsy    left face   Hypercholesteremia    managed by dr Ronald [name]   Hypertension    Ischemic heart disease    He had a stent in his right  coronary artery in 2004; He does have a dual ostium of the left coronary system.    Lung nodules    noted on past CT scans   Malaria    SVT (supraventricular tachycardia) (HCC)    remote episode during exercise test   Thoracic aortic aneurysm    last scan in Jan 2013. Now measuring 5.0cm. Referred to Dr. Servando Snare   Wears dentures    bottom   Wears glasses     Past Surgical History:  Procedure Laterality Date   ATRIAL FIBRILLATION ABLATION N/A 05/12/2019   Procedure: ATRIAL FIBRILLATION ABLATION;  Surgeon: Ronald Grayer, MD;  Location: [place] [facility];  Service: Cardiovascular;  Laterality: N/A;   CARDIOVERSION N/A 03/13/2019   Procedure: CARDIOVERSION;  Surgeon: Fay Records, MD;  Location: [place];  Service: Cardiovascular;  Laterality: N/A;   CARDIOVERSION N/A 04/15/2019   Procedure: CARDIOVERSION;  Surgeon: Thayer Headings, MD;  Location: [facility];  Service: Cardiovascular;  Laterality: N/A;   CARDIOVERSION N/A 06/16/2019   Procedure: CARDIOVERSION;  Surgeon: Skeet Latch, MD;  Location: [place];  Service: Cardiovascular;  Laterality: N/A;   CERVICAL FUSION  03-2010   c2-c7   COLONOSCOPY     x4   CORONARY STENT PLACEMENT  2004   dental implant     x 2   ELBOW SURGERY  1997   lt and rt   EYE SURGERY     HIP PINNING,CANNULATED Right 05/11/2014   Procedure: CANNULATED HIP PINNING;  Surgeon: Renette Butters, MD;  Location: [place];  Service: Orthopedics;  Laterality: Right;   RETINAL DETACHMENT SURGERY  1975   lt   rotator cuff surgery  1997   lt   SHOULDER ARTHROSCOPY  2/15   right-got cardiac clearance-gsc   TONSILLECTOMY     TRIGGER FINGER RELEASE Left 10/12/2013   Procedure: LEFT LONG FINGER TRIGGER RELEASE;  Surgeon: Jolyn Nap, MD;  Location: [place];  Service: Orthopedics;  Laterality: Left;    Current Outpatient Medications  Medication Sig Dispense Refill   amiodarone (PACERONE) 200 MG tablet TAKE 1 TABLET BY MOUTH TWICE A DAY  180 tablet 3   chlorthalidone (HYGROTON) 25 MG tablet Take 25 mg by mouth daily.      clobetasol ointment (TEMOVATE) AB-123456789 % Apply 1 application topically 2 (two) times daily as needed (itchy scaly on legs).      Coenzyme Q10 (COQ-10) 200 MG CAPS Take 200 mg by mouth every evening.     colesevelam (WELCHOL) 625 MG tablet Take 3,750 mg by mouth daily with breakfast.     ELIQUIS 5 MG TABS tablet TAKE 1 TABLET BY MOUTH TWICE A DAY 60 tablet 8   ezetimibe (ZETIA) 10 MG tablet Take 10 mg by mouth daily.     fluticasone (FLONASE) 50 MCG/ACT nasal spray Place 1 spray into both nostrils daily as needed for allergies (sinus congestion).     Glucosamine HCl 1500 MG TABS Take 3,000 mg by mouth daily.     hydrocortisone 2.5 % cream Apply 1 application topically as needed (eye brows flakey). Use on head     Ivermectin (SOOLANTRA EX) Apply 1 application topically daily as needed (Rosacea).      ketoconazole (NIZORAL) 2 % cream Apply 1 application topically daily as needed for irritation (Corner of mouth (sores)).     lisinopril (PRINIVIL,ZESTRIL) 40 MG tablet Take 40 mg by mouth every evening.      LIVALO 4 MG TABS Take 2 mg by mouth at bedtime.      Multiple Vitamin (MULTIVITAMIN WITH MINERALS) TABS tablet Take 1 tablet by mouth daily.      mupirocin ointment (BACTROBAN) 2 % Apply 1 application topically daily as needed (tick bites).     nitroGLYCERIN (NITROSTAT) 0.4 MG SL tablet Place 1 tablet (0.4 mg total) under the tongue every 5 (five) minutes as needed for chest pain. 25 tablet 1   omeprazole (PRILOSEC) 20 MG capsule Take 20 mg by mouth in the morning and at bedtime. BEFORE BREAKFAST & BEFORE SUPPER     Vitamin D, Cholecalciferol, 25 MCG (1000 UT) CAPS Take 1,000 Units by mouth daily.     No current facility-administered medications for this visit.    Allergies:   Quinolones, Promethazine hcl, Vesicare [solifenacin succinate], Atorvastatin, and Ezetimibe-simvastatin   Social History:  The patient   reports that he has quit smoking. His smokeless tobacco use includes chew. He reports current alcohol use of about 1.0 standard drink per week. He reports that he does not use drugs.   Family History:  The patient's family history includes Cancer (age of onset: 10) in his mother; Heart attack in his father; Heart disease in his father.  ROS:  Please see the history of  present illness.    All other systems are reviewed and otherwise negative.   PHYSICAL EXAM:  VS:  BP 108/78   Pulse 67   Ht 6\' 7"  (2.007 m)   Wt 280 lb (127 kg)   SpO2 97%   BMI 31.54 kg/m  BMI: Body mass index is 31.54 kg/m. Well nourished, well developed, in no acute distress HEENT: normocephalic, atraumatic Neck: no JVD, carotid bruits or masses Cardiac:  RRR; no significant murmurs, no rubs, or gallops Lungs:  CTA b/l, no wheezing, rhonchi or rales Abd: soft, nontender MS: no deformity or atrophy Ext: no edema Skin: warm and dry, no rash Neuro:  No gross deficits appreciated Psych: euthymic mood, full affect    EKG:  not done today  05/12/2019: EPS/Ablation CONCLUSIONS: 1. Atrial fibrillation upon presentation.   2. Intracardiac echo reveals a moderate sized left atrium with four separate pulmonary veins without evidence of pulmonary vein stenosis.  There was an accessory left atrial appendage just anterior to the ostium of the right superior pulmonary vein.  The coronary sinus ostium was large.  The aortic valve and aortic root were also large. 3. Successful electrical isolation and anatomical encircling of all four pulmonary veins with radiofrequency current.  A WACA approach was used 3. Additional left atrial ablation was performed with a standard box lesion created along the posterior wall of the left atrium 4. Atrial fibrillation successfully cardioverted to sinus rhythm. 5. No early apparent complications.   Cardiac cath 06/08/11: Left main: There are separate ostia for the LAD and Circumflex.  Left  Anterior Descending Artery: Large vessel that courses to the apex. No obstructive disease noted.Small diagonal branch.  Circumflex Artery: Large caliber vessel with no obstructive disease noted.  Right Coronary Artery: Very large, dominant vessel with patent proximal stent. There is mild plaque disease in the mid and distal RCA. The small caliber PDA has 60% stenosis.  Left Ventricular Angiogram: Dilated LV. LVEF=50%.  Aortic root: Dilated aortic root and ascending aorta.  Impression:  1. 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 Preserved LV systolic function  3. 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    Echo April 2022:  1. Left ventricular ejection fraction, by estimation, is 55 to 60%. The  left ventricle has normal function. The left ventricle has no regional  wall motion abnormalities. The left ventricular internal cavity size was  borderline dilated (adjusted for body   size). Left ventricular diastolic parameters are indeterminate.   2. Right ventricular systolic function is normal. The right ventricular  size is normal. Tricuspid regurgitation signal is inadequate for assessing  PA pressure.   3. The mitral valve is normal in structure. No evidence of mitral valve  regurgitation.   4. The aortic valve is tricuspid. Aortic valve regurgitation is mild.   5. Aortic dilatation noted. There is moderate to severe dilatation of the  aortic root, measuring 51 mm and of the ascending aorta, measuring 50 mm.   Recent Labs: 02/07/2021: BUN 23; Creatinine, Ser 1.26; Hemoglobin 14.7; Platelets 208; Potassium 4.4; Sodium 139  No results found for requested labs within last 8760 hours.   CrCl cannot be calculated (Patient's most recent lab result is older than the maximum 21 days allowed.).   Wt Readings from Last 3 Encounters:  03/20/21 280 lb (127 kg)  02/22/21 279 lb 9.6 oz (126.8 kg)  09/08/20 289 lb (131.1 kg)     Other studies reviewed: Additional studies/records  reviewed today include:  summarized above  ASSESSMENT AND PLAN:  Persistent Afib, atypical aflutter CHA2DS2Vasc is 4, on Eliquis, appropriately odsed Amiodarone Labs today  CAD No CP No BB w/hx of bradycardia No ASA with eliquis Follows with Ronald [name]  HTN Looks good  Disposition: F/u with Ronald [name] as directed by him, EP with Dr. Quentin Ore, given Dr. Jackalyn Lombard retirement in 1 year, sooner if needed    Current medicines are reviewed at length with the patient today.  The patient did not have any concerns regarding medicines.  Venetia Night, PA-C 03/20/2021 10:31 AM     [facility] [place] [address] [phone] (office)  [phone] (fax)

## 2021-03-20 ENCOUNTER — Encounter: Payer: Self-pay | Admitting: Physician Assistant

## 2021-03-20 ENCOUNTER — Other Ambulatory Visit: Payer: Self-pay

## 2021-03-20 ENCOUNTER — Ambulatory Visit: Payer: Medicare PPO | Admitting: Physician Assistant

## 2021-03-20 VITALS — BP 108/78 | HR 67 | Ht 79.0 in | Wt 280.0 lb

## 2021-03-20 DIAGNOSIS — I484 Atypical atrial flutter: Secondary | ICD-10-CM | POA: Diagnosis not present

## 2021-03-20 DIAGNOSIS — Z79899 Other long term (current) drug therapy: Secondary | ICD-10-CM

## 2021-03-20 DIAGNOSIS — I10 Essential (primary) hypertension: Secondary | ICD-10-CM

## 2021-03-20 DIAGNOSIS — I4819 Other persistent atrial fibrillation: Secondary | ICD-10-CM

## 2021-03-20 DIAGNOSIS — I2510 Atherosclerotic heart disease of native coronary artery without angina pectoris: Secondary | ICD-10-CM

## 2021-03-20 LAB — HEPATIC FUNCTION PANEL
ALT: 18 IU/L (ref 0–44)
AST: 20 IU/L (ref 0–40)
Albumin: 4.4 g/dL (ref 3.7–4.7)
Alkaline Phosphatase: 79 IU/L (ref 44–121)
Bilirubin Total: 0.5 mg/dL (ref 0.0–1.2)
Bilirubin, Direct: 0.18 mg/dL (ref 0.00–0.40)
Total Protein: 6.6 g/dL (ref 6.0–8.5)

## 2021-03-20 LAB — TSH: TSH: 3.34 u[IU]/mL (ref 0.450–4.500)

## 2021-03-20 NOTE — Patient Instructions (Addendum)
Medication Instructions:   Your physician recommends that you continue on your current medications as directed. Please refer to the Current Medication list given to you today.   *If you need a refill on your cardiac medications before your next appointment, please call your pharmacy*   Lab Work: LFT AND TSH TODAY    If you have labs (blood work) drawn today and your tests are completely normal, you will receive your results only by: MyChart Message (if you have MyChart) OR A paper copy in the mail If you have any lab test that is abnormal or we need to change your treatment, we will call you to review the results.   Testing/Procedures:  NONE ORDERED  TODAY    Follow-Up: At [facility], you and your health needs are our priority.  As part of our continuing mission to provide you with exceptional heart care, we have created designated Provider Care Teams.  These Care Teams include your primary Cardiologist (physician) and Advanced Practice Providers (APPs -  Physician Assistants and Nurse Practitioners) who all work together to provide you with the care you need, when you need it.  We recommend signing up for the patient portal called "MyChart".  Sign up information is provided on this After Visit Summary.  MyChart is used to connect with patients for Virtual Visits (Telemedicine).  Patients are able to view lab/test results, encounter notes, upcoming appointments, etc.  Non-urgent messages can be sent to your provider as well.   To learn more about what you can do with MyChart, go to [email or web address].    Your next appointment:   1 year(s)  The format for your next appointment:   In Person  Provider:   Steffanie Dunn, MD    Other Instructions

## 2021-03-23 DIAGNOSIS — M47816 Spondylosis without myelopathy or radiculopathy, lumbar region: Secondary | ICD-10-CM | POA: Diagnosis not present

## 2021-04-13 DIAGNOSIS — M47816 Spondylosis without myelopathy or radiculopathy, lumbar region: Secondary | ICD-10-CM | POA: Diagnosis not present

## 2021-04-21 DIAGNOSIS — M25551 Pain in right hip: Secondary | ICD-10-CM | POA: Diagnosis not present

## 2021-04-27 DIAGNOSIS — M47816 Spondylosis without myelopathy or radiculopathy, lumbar region: Secondary | ICD-10-CM | POA: Diagnosis not present

## 2021-05-01 DIAGNOSIS — M25551 Pain in right hip: Secondary | ICD-10-CM | POA: Diagnosis not present

## 2021-05-11 DIAGNOSIS — I4891 Unspecified atrial fibrillation: Secondary | ICD-10-CM | POA: Diagnosis not present

## 2021-05-11 DIAGNOSIS — R059 Cough, unspecified: Secondary | ICD-10-CM | POA: Diagnosis not present

## 2021-05-11 DIAGNOSIS — I10 Essential (primary) hypertension: Secondary | ICD-10-CM | POA: Diagnosis not present

## 2021-05-11 DIAGNOSIS — E118 Type 2 diabetes mellitus with unspecified complications: Secondary | ICD-10-CM | POA: Diagnosis not present

## 2021-05-11 DIAGNOSIS — D6869 Other thrombophilia: Secondary | ICD-10-CM | POA: Diagnosis not present

## 2021-05-11 DIAGNOSIS — I712 Thoracic aortic aneurysm, without rupture, unspecified: Secondary | ICD-10-CM | POA: Diagnosis not present

## 2021-05-11 DIAGNOSIS — I259 Chronic ischemic heart disease, unspecified: Secondary | ICD-10-CM | POA: Diagnosis not present

## 2021-05-11 DIAGNOSIS — I872 Venous insufficiency (chronic) (peripheral): Secondary | ICD-10-CM | POA: Diagnosis not present

## 2021-05-11 DIAGNOSIS — Z1152 Encounter for screening for COVID-19: Secondary | ICD-10-CM | POA: Diagnosis not present

## 2021-05-11 DIAGNOSIS — I2510 Atherosclerotic heart disease of native coronary artery without angina pectoris: Secondary | ICD-10-CM | POA: Diagnosis not present

## 2021-05-11 DIAGNOSIS — E785 Hyperlipidemia, unspecified: Secondary | ICD-10-CM | POA: Diagnosis not present

## 2021-05-12 DIAGNOSIS — M47816 Spondylosis without myelopathy or radiculopathy, lumbar region: Secondary | ICD-10-CM | POA: Diagnosis not present

## 2021-06-08 DIAGNOSIS — K5900 Constipation, unspecified: Secondary | ICD-10-CM | POA: Diagnosis not present

## 2021-06-08 DIAGNOSIS — Z1211 Encounter for screening for malignant neoplasm of colon: Secondary | ICD-10-CM | POA: Diagnosis not present

## 2021-06-19 DIAGNOSIS — K5900 Constipation, unspecified: Secondary | ICD-10-CM | POA: Diagnosis not present

## 2021-06-19 DIAGNOSIS — I10 Essential (primary) hypertension: Secondary | ICD-10-CM | POA: Diagnosis not present

## 2021-06-19 DIAGNOSIS — R110 Nausea: Secondary | ICD-10-CM | POA: Diagnosis not present

## 2021-06-19 DIAGNOSIS — K219 Gastro-esophageal reflux disease without esophagitis: Secondary | ICD-10-CM | POA: Diagnosis not present

## 2021-06-19 DIAGNOSIS — R109 Unspecified abdominal pain: Secondary | ICD-10-CM | POA: Diagnosis not present

## 2021-06-26 ENCOUNTER — Other Ambulatory Visit: Payer: Self-pay

## 2021-06-26 ENCOUNTER — Emergency Department (HOSPITAL_BASED_OUTPATIENT_CLINIC_OR_DEPARTMENT_OTHER)
Admission: EM | Admit: 2021-06-26 | Discharge: 2021-06-26 | Disposition: A | Discharge status: Home / Self Care | Payer: Medicare PPO | Attending: Emergency Medicine | Admitting: Emergency Medicine

## 2021-06-26 ENCOUNTER — Emergency Department (HOSPITAL_BASED_OUTPATIENT_CLINIC_OR_DEPARTMENT_OTHER): Payer: Medicare PPO

## 2021-06-26 ENCOUNTER — Encounter (HOSPITAL_BASED_OUTPATIENT_CLINIC_OR_DEPARTMENT_OTHER): Payer: Self-pay

## 2021-06-26 DIAGNOSIS — R109 Unspecified abdominal pain: Secondary | ICD-10-CM | POA: Diagnosis not present

## 2021-06-26 DIAGNOSIS — Z7901 Long term (current) use of anticoagulants: Secondary | ICD-10-CM | POA: Insufficient documentation

## 2021-06-26 DIAGNOSIS — K5900 Constipation, unspecified: Secondary | ICD-10-CM | POA: Diagnosis not present

## 2021-06-26 DIAGNOSIS — I700 Atherosclerosis of aorta: Secondary | ICD-10-CM | POA: Diagnosis not present

## 2021-06-26 DIAGNOSIS — R110 Nausea: Secondary | ICD-10-CM | POA: Diagnosis not present

## 2021-06-26 LAB — CBC
HCT: 42.9 % (ref 39.0–52.0)
Hemoglobin: 14 g/dL (ref 13.0–17.0)
MCH: 30.9 pg (ref 26.0–34.0)
MCHC: 32.6 g/dL (ref 30.0–36.0)
MCV: 94.7 fL (ref 80.0–100.0)
Platelets: 229 10*3/uL (ref 150–400)
RBC: 4.53 MIL/uL (ref 4.22–5.81)
RDW: 13.1 % (ref 11.5–15.5)
WBC: 5.5 10*3/uL (ref 4.0–10.5)
nRBC: 0 % (ref 0.0–0.2)

## 2021-06-26 LAB — COMPREHENSIVE METABOLIC PANEL
ALT: 56 U/L — ABNORMAL HIGH (ref 0–44)
AST: 36 U/L (ref 15–41)
Albumin: 4.2 g/dL (ref 3.5–5.0)
Alkaline Phosphatase: 125 U/L (ref 38–126)
Anion gap: 6 (ref 5–15)
BUN: 24 mg/dL — ABNORMAL HIGH (ref 8–23)
CO2: 30 mmol/L (ref 22–32)
Calcium: 9.7 mg/dL (ref 8.9–10.3)
Chloride: 105 mmol/L (ref 98–111)
Creatinine, Ser: 1.12 mg/dL (ref 0.61–1.24)
GFR, Estimated: >60 mL/min (ref >60)
Glucose, Bld: 149 mg/dL — ABNORMAL HIGH (ref 70–99)
Potassium: 4.5 mmol/L (ref 3.5–5.1)
Sodium: 141 mmol/L (ref 135–145)
Total Bilirubin: 0.6 mg/dL (ref 0.3–1.2)
Total Protein: 7.2 g/dL (ref 6.5–8.1)

## 2021-06-26 LAB — URINALYSIS, ROUTINE W REFLEX MICROSCOPIC
Bilirubin Urine: NEGATIVE
Glucose, UA: 100 mg/dL — AB
Hgb urine dipstick: NEGATIVE
Ketones, ur: NEGATIVE mg/dL
Leukocytes,Ua: NEGATIVE
Nitrite: NEGATIVE
Specific Gravity, Urine: 1.029 (ref 1.005–1.030)
pH: 7 (ref 5.0–8.0)

## 2021-06-26 LAB — LIPASE, BLOOD: Lipase: 24 U/L (ref 11–51)

## 2021-06-26 MED ORDER — IOHEXOL 300 MG/ML  SOLN
100.0000 mL | Freq: Once | INTRAMUSCULAR | Status: AC | PRN
  Administered 2021-06-26: 100 mL via INTRAVENOUS

## 2021-06-26 MED ORDER — SODIUM CHLORIDE 0.9 % IV BOLUS
500.0000 mL | Freq: Once | INTRAVENOUS | Status: AC
  Administered 2021-06-26: 500 mL via INTRAVENOUS

## 2021-06-26 NOTE — ED Provider Notes (Signed)
?[facility] EMERGENCY DEPT ?Provider Note ? ? ?CSN: [id number] ?Arrival date & time: 06/26/21  1523 ? ?  ? ?History ? ?Chief Complaint  ?Patient presents with  ? Constipation  ? ? ?Ronald [name] is a 81 y.o. male. ? ? ?Constipation ? ?Patient is a 81 year old male presented emergency room today with abdominal pain approximately 1 or 2/10 constant generalized endorses some mild nausea no vomiting or diarrhea no fevers no urinary frequency urgency dysuria or hematuria.  States that he feels that he is constipated.  He has felt the symptoms for approximately 1 month.  He has been seen by other providers and is currently taking MiraLAX x2/day and GoLytely and Colace.  States minimal improvement with his symptoms.  He states he had an x-ray done with outpatient provider and was told that he looked as if he has some constipation present. ? ?He did have a bowel movement yesterday he denies any blood in his stool he is eating and drinking normally ? ?  ? ?Home Medications ?Prior to Admission medications   ?Medication Sig Start Date End Date Taking? Authorizing Provider  ?amiodarone (PACERONE) 200 MG tablet TAKE 1 TABLET BY MOUTH TWICE A DAY 03/08/21   Kathleene Hazel, MD  ?chlorthalidone (HYGROTON) 25 MG tablet Take 25 mg by mouth daily.     [provider]  ?clobetasol ointment (TEMOVATE) 0.05 % Apply 1 application topically 2 (two) times daily as needed (itchy scaly on legs).     [provider]  ?Coenzyme Q10 (COQ-10) 200 MG CAPS Take 200 mg by mouth every evening.    [provider]  ?colesevelam (WELCHOL) 625 MG tablet Take 3,750 mg by mouth daily with breakfast.    [provider]  ?ELIQUIS 5 MG TABS tablet TAKE 1 TABLET BY MOUTH TWICE A DAY 01/09/21   Kathleene Hazel, MD  ?ezetimibe (ZETIA) 10 MG tablet Take 10 mg by mouth daily.    [provider]  ?fluticasone (FLONASE) 50 MCG/ACT nasal spray Place 1 spray into both nostrils daily as needed  for allergies (sinus congestion).    [provider]  ?Glucosamine HCl 1500 MG TABS Take 3,000 mg by mouth daily.    [provider]  ?hydrocortisone 2.5 % cream Apply 1 application topically as needed (eye brows flakey). Use on head 07/09/14   [provider]  ?Ivermectin (SOOLANTRA EX) Apply 1 application topically daily as needed (Rosacea).     [provider]  ?ketoconazole (NIZORAL) 2 % cream Apply 1 application topically daily as needed for irritation (Corner of mouth (sores)).    [provider]  ?lisinopril (PRINIVIL,ZESTRIL) 40 MG tablet Take 40 mg by mouth every evening.     [provider]  ?LIVALO 4 MG TABS Take 2 mg by mouth at bedtime.  02/24/19   [provider]  ?Multiple Vitamin (MULTIVITAMIN WITH MINERALS) TABS tablet Take 1 tablet by mouth daily.     [provider]  ?mupirocin ointment (BACTROBAN) 2 % Apply 1 application topically daily as needed (tick bites).    [provider]  ?nitroGLYCERIN (NITROSTAT) 0.4 MG SL tablet Place 1 tablet (0.4 mg total) under the tongue every 5 (five) minutes as needed for chest pain. 04/07/19   Kathleene Hazel, MD  ?omeprazole (PRILOSEC) 20 MG capsule Take 20 mg by mouth in the morning and at bedtime. BEFORE BREAKFAST & BEFORE SUPPER    [provider]  ?Vitamin D, Cholecalciferol, 25 MCG (1000 UT) CAPS  Take 1,000 Units by mouth daily. 02/17/09   [provider]  ?   ? ?Allergies    ?Quinolones, Promethazine hcl, Vesicare [solifenacin succinate], Atorvastatin, and Ezetimibe-simvastatin   ? ?Review of Systems   ?Review of Systems  ?Gastrointestinal:  Positive for constipation.  ? ?Physical Exam ?Updated Vital Signs ?BP (!) 148/92   Pulse (!) 55   Temp 97.8 ?F (36.6 ?C)   Resp 18   Ht 6\' 7"  (2.007 m)   Wt 127 kg   SpO2 98%   BMI 31.54 kg/m?  ?Physical Exam ?Vitals and nursing note reviewed.  ?Constitutional:   ?   General: He is not in acute distress. ?    Comments: Pleasant nontoxic 81 year old male  ?HENT:  ?   Head: Normocephalic and atraumatic.  ?   Nose: Nose normal.  ?Eyes:  ?   General: No scleral icterus. ?Cardiovascular:  ?   Rate and Rhythm: Normal rate and regular rhythm.  ?   Pulses: Normal pulses.  ?   Heart sounds: Normal heart sounds.  ?Pulmonary:  ?   Effort: Pulmonary effort is normal. No respiratory distress.  ?   Breath sounds: No wheezing.  ?Abdominal:  ?   Palpations: Abdomen is soft.  ?   Tenderness: There is abdominal tenderness. There is no guarding or rebound.  ?   Comments: Protuberant abdomen.  Soft, no guarding or rebound.  Minimally tender generally but no focal abdominal tenderness  ?Musculoskeletal:  ?   Cervical back: Normal range of motion.  ?   Right lower leg: No edema.  ?   Left lower leg: No edema.  ?Skin: ?   General: Skin is warm and dry.  ?   Capillary Refill: Capillary refill takes less than 2 seconds.  ?Neurological:  ?   Mental Status: He is alert. Mental status is at baseline.  ?Psychiatric:     ?   Mood and Affect: Mood normal.     ?   Behavior: Behavior normal.  ? ? ?ED Results / Procedures / Treatments   ?Labs ?(all labs ordered are listed, but only abnormal results are displayed) ?Labs Reviewed  ?COMPREHENSIVE METABOLIC PANEL - Abnormal; Notable for the following components:  ?    Result Value  ? Glucose, Bld 149 (*)   ? BUN 24 (*)   ? ALT 56 (*)   ? All other components within normal limits  ?URINALYSIS, ROUTINE W REFLEX MICROSCOPIC - Abnormal; Notable for the following components:  ? Glucose, UA 100 (*)   ? Protein, ur TRACE (*)   ? All other components within normal limits  ?LIPASE, BLOOD  ?CBC  ? ? ?EKG ?None ? ?Radiology ?CT ABDOMEN PELVIS W CONTRAST ? ?Result Date: 06/26/2021 ?CLINICAL DATA:  Mid abdominal pain EXAM: CT ABDOMEN AND PELVIS WITH CONTRAST TECHNIQUE: Multidetector CT imaging of the abdomen and pelvis was performed using the standard protocol following bolus administration of intravenous contrast.  RADIATION DOSE REDUCTION: This exam was performed according to the departmental dose-optimization program which includes automated exposure control, adjustment of the mA and/or kV according to patient size and/or use of iterative reconstruction technique. CONTRAST:  100mL OMNIPAQUE IOHEXOL 300 MG/ML  SOLN COMPARISON:  CT abdomen pelvis 02/08/2019 FINDINGS: Lower chest: No acute abnormality. Hepatobiliary: No focal liver abnormality is seen. No gallstones, gallbladder wall thickening, or biliary dilatation. Pancreas: Unremarkable. No pancreatic ductal dilatation or surrounding inflammatory changes. Spleen: Normal in size without focal abnormality. Adrenals/Urinary Tract: Adrenal glands are unremarkable. Kidneys are  normal, without renal calculi, focal lesion, or hydronephrosis. Bladder is unremarkable. Stomach/Bowel: Stomach is within normal limits. Normal appendix. No evidence of bowel wall thickening, distention, or inflammatory changes. Multiple colonic diverticula without evidence of diverticulitis. Vascular/Lymphatic: Aortic atherosclerosis. No enlarged abdominal or pelvic lymph nodes. Reproductive: Prostate is unremarkable. Other: Tiny fat containing umbilical hernia. No abdominopelvic ascites. Musculoskeletal: Surgical hardware noted in the proximal right femur. Multilevel degenerative changes in the lumbar spine. IMPRESSION: 1. No acute intra-abdominal pathology. 2. Colonic diverticulosis without evidence of diverticulitis. 3. Aortic atherosclerosis. Aortic Atherosclerosis (ICD10-I70.0). Electronically Signed   By: Emmaline Kluver M.D.   On: 06/26/2021 19:48   ? ?Procedures ?Procedures  ? ? ?Medications Ordered in ED ?Medications  ?sodium chloride 0.9 % bolus 500 mL (0 mLs Intravenous Stopped 06/26/21 2128)  ?iohexol (OMNIPAQUE) 300 MG/ML solution 100 mL (100 mLs Intravenous Contrast Given 06/26/21 1925)  ? ? ?ED Course/ Medical Decision Making/ A&P ?Clinical Course as of 06/26/21 2220  ?Mon Jun 26, 2021   ?1853 Last BM yesterday. Seems to have been experiencing generalized abd pain for 4 weeks. Had xray of abd and was put on miralax and golytely and colace.  [WF]  ?  ?Clinical Course User Index ?[WF] [facility]

## 2021-06-26 NOTE — Discharge Instructions (Addendum)
As we discussed, 3 factors that affect constipation the most are hydration and fiber intake and exercise. ? ?Please follow-up with a gastroenterologist as well as your primary care doctor.  Make sure you are drinking plenty of water and you can titrate up the MiraLAX I recommend using 3-4 doses per day and drinking plenty of water ? ?Make sure that you continue to increase your exercise you may continue to see other medicines that you have been using to help with your constipation. ?

## 2021-06-26 NOTE — ED Triage Notes (Signed)
Patient here POV from Home with Constipation.  ? ?Endorses Constipation approximately 1 month ago that was unresponsive to Miralax and other laxatives/stool softeners.  ? ?Associated with ABD Pain in MID ABD. Mild Nausea. No Vomiting or Diarrhea. No Fevers. ? ?NAD Noted during Triage. A&Ox4. GCS 15. Ambulatory. ?

## 2021-06-27 DIAGNOSIS — M47816 Spondylosis without myelopathy or radiculopathy, lumbar region: Secondary | ICD-10-CM | POA: Diagnosis not present

## 2021-07-05 DIAGNOSIS — R110 Nausea: Secondary | ICD-10-CM | POA: Diagnosis not present

## 2021-07-05 DIAGNOSIS — K219 Gastro-esophageal reflux disease without esophagitis: Secondary | ICD-10-CM | POA: Diagnosis not present

## 2021-07-05 DIAGNOSIS — R109 Unspecified abdominal pain: Secondary | ICD-10-CM | POA: Diagnosis not present

## 2021-07-05 DIAGNOSIS — R634 Abnormal weight loss: Secondary | ICD-10-CM | POA: Diagnosis not present

## 2021-07-05 DIAGNOSIS — K5900 Constipation, unspecified: Secondary | ICD-10-CM | POA: Diagnosis not present

## 2021-07-05 DIAGNOSIS — R748 Abnormal levels of other serum enzymes: Secondary | ICD-10-CM | POA: Diagnosis not present

## 2021-07-13 ENCOUNTER — Telehealth: Payer: Self-pay | Admitting: *Deleted

## 2021-07-13 DIAGNOSIS — I2510 Atherosclerotic heart disease of native coronary artery without angina pectoris: Secondary | ICD-10-CM | POA: Diagnosis not present

## 2021-07-13 DIAGNOSIS — K5900 Constipation, unspecified: Secondary | ICD-10-CM | POA: Diagnosis not present

## 2021-07-13 DIAGNOSIS — I4891 Unspecified atrial fibrillation: Secondary | ICD-10-CM | POA: Diagnosis not present

## 2021-07-13 DIAGNOSIS — Z86010 Personal history of colonic polyps: Secondary | ICD-10-CM | POA: Diagnosis not present

## 2021-07-13 DIAGNOSIS — R1013 Epigastric pain: Secondary | ICD-10-CM | POA: Diagnosis not present

## 2021-07-13 NOTE — Telephone Encounter (Signed)
? ?  Pre-operative Risk Assessment  ?  ?Patient Name: Ronald [name]  ?DOB: 1940-08-02 ?MRN: [id number]  ? ?  ? ?Request for Surgical Clearance   ? ?Procedure:   COLONOSCOPY AND ENDOSCOPY : EPIGASTRIC PAIN AND H/O COLON POLYPS ? ?Date of Surgery:  Clearance 08/10/21                              ?   ?Surgeon:  DR. Watt Climes ?Surgeon's Group or Practice Name:  [facility] ?Phone number:  [phone] ?Fax number:  [phone] ?  ?Type of Clearance Requested:   ?- Medical  ?- Pharmacy:  Hold Apixaban (Eliquis) x 2 DAYS PRIOR ?  ?Type of Anesthesia:   PROPOFOL ?  ?Additional requests/questions:   ? ?Signed, ?Julaine Hua   ?07/13/2021, 10:31 AM  ? ?

## 2021-07-14 NOTE — Telephone Encounter (Signed)
Patient with diagnosis of A Fib on Eliquis for anticoagulation.   ? ?Procedure: COLONOSCOPY AND ENDOSCOPY : EPIGASTRIC PAIN AND H/O COLON POLYPS ?  ?Date of procedure: 08/10/21 ? ?CHA2DS2-VASc Score = 4  ?This indicates a 4.8% annual risk of stroke. ?The patient's score is based upon: ?CHF History: 0 ?HTN History: 1 ?Diabetes History: 0 ?Stroke History: 0 ?Vascular Disease History: 1 ?Age Score: 2 ?Gender Score: 0 ? ?CrCl 98 mL/min ?Platelet count 229K ? ?Per office protocol, patient can hold Eliquis for 2 days prior to procedure.   ?

## 2021-07-14 NOTE — Telephone Encounter (Signed)
? ?  Patient Name: Ronald [name]  ?DOB: 04/13/1940 ?MRN: [id number] ? ?Primary Cardiologist: Lauree Chandler, MD ? ?Chart reviewed as part of pre-operative protocol coverage.  ? ?Preoperative team, please contact this patient and set up a phone call appointment for further cardiac evaluation.   ? ?Thank you for your help. ? ?Patient with diagnosis of A Fib on Eliquis for anticoagulation.   ?  ?Procedure: COLONOSCOPY AND ENDOSCOPY : EPIGASTRIC PAIN AND H/O COLON POLYPS ?  ?Date of procedure: 08/10/21 ?  ?CHA2DS2-VASc Score = 4  ?This indicates a 4.8% annual risk of stroke. ?The patient's score is based upon: ?CHF History: 0 ?HTN History: 1 ?Diabetes History: 0 ?Stroke History: 0 ?Vascular Disease History: 1 ?Age Score: 2 ?Gender Score: 0 ?  ?CrCl 98 mL/min ?Platelet count 229K ?  ?Per office protocol, patient can hold Eliquis for 2 days prior to procedure.   ? ?Lenna Sciara, NP ?07/14/2021, 9:19 AM ? ? ?

## 2021-07-14 NOTE — Telephone Encounter (Signed)
Left message for the pt to call the office to schedule a tele pre op appt 

## 2021-07-17 ENCOUNTER — Telehealth: Payer: Self-pay | Admitting: *Deleted

## 2021-07-17 ENCOUNTER — Ambulatory Visit (INDEPENDENT_AMBULATORY_CARE_PROVIDER_SITE_OTHER): Payer: Medicare PPO | Admitting: Nurse Practitioner

## 2021-07-17 ENCOUNTER — Encounter: Payer: Self-pay | Admitting: Nurse Practitioner

## 2021-07-17 ENCOUNTER — Telehealth: Payer: Medicare PPO

## 2021-07-17 DIAGNOSIS — Z01810 Encounter for preprocedural cardiovascular examination: Secondary | ICD-10-CM

## 2021-07-17 NOTE — Telephone Encounter (Signed)
Left message x 2 for the pt to call to schedule a tele pre op appt.  ?

## 2021-07-17 NOTE — Telephone Encounter (Signed)
Pt agreeable to tele pre op appt today @ 10:20. MED rec and consent are done.  ? ?  ?Patient Consent for Virtual Visit  ? ? ?   ? ?Ronald [name] has provided verbal consent on 07/17/2021 for a virtual visit (video or telephone). ? ? ?CONSENT FOR VIRTUAL VISIT FOR:  Ronald [name]  ?By participating in this virtual visit I agree to the following: ? ?I hereby voluntarily request, consent and authorize [facility] and its employed or contracted physicians, physician assistants, nurse practitioners or other licensed health care professionals (the Practitioner), to provide me with telemedicine health care services (the ?Services") as deemed necessary by the treating Practitioner. I acknowledge and consent to receive the Services by the Practitioner via telemedicine. I understand that the telemedicine visit will involve communicating with the Practitioner through live audiovisual communication technology and the disclosure of certain medical information by electronic transmission. I acknowledge that I have been given the opportunity to request an in-person assessment or other available alternative prior to the telemedicine visit and am voluntarily participating in the telemedicine visit. ? ?I understand that I have the right to withhold or withdraw my consent to the use of telemedicine in the course of my care at any time, without affecting my right to future care or treatment, and that the Practitioner or I may terminate the telemedicine visit at any time. I understand that I have the right to inspect all information obtained and/or recorded in the course of the telemedicine visit and may receive copies of available information for a reasonable fee.  I understand that some of the potential risks of receiving the Services via telemedicine include:  ?Delay or interruption in medical evaluation due to technological equipment failure or disruption; ?Information transmitted may not be sufficient (e.g. poor resolution of  images) to allow for appropriate medical decision making by the Practitioner; and/or  ?In rare instances, security protocols could fail, causing a breach of personal health information. ? ?Furthermore, I acknowledge that it is my responsibility to provide information about my medical history, conditions and care that is complete and accurate to the best of my ability. I acknowledge that Practitioner's advice, recommendations, and/or decision may be based on factors not within their control, such as incomplete or inaccurate data provided by me or distortions of diagnostic images or specimens that may result from electronic transmissions. I understand that the practice of medicine is not an exact science and that Practitioner makes no warranties or guarantees regarding treatment outcomes. I acknowledge that a copy of this consent can be made available to me via my patient portal [facility] MyChart), or I can request a printed copy by calling the office of [facility].   ? ?I understand that my insurance will be billed for this visit.  ? ?I have read or had this consent read to me. ?I understand the contents of this consent, which adequately explains the benefits and risks of the Services being provided via telemedicine.  ?I have been provided ample opportunity to ask questions regarding this consent and the Services and have had my questions answered to my satisfaction. ?I give my informed consent for the services to be provided through the use of telemedicine in my medical care ? ? ? ?

## 2021-07-17 NOTE — Progress Notes (Signed)
? ?Virtual Visit via Telephone Note  ? ?This visit type was conducted due to national recommendations for restrictions regarding the COVID-19 Pandemic (e.g. social distancing) in an effort to limit this patient's exposure and mitigate transmission in our community.  Due to his co-morbid illnesses, this patient is at least at moderate risk for complications without adequate follow up.  This format is felt to be most appropriate for this patient at this time.  The patient did not have access to video technology/had technical difficulties with video requiring transitioning to audio format only (telephone).  All issues noted in this document were discussed and addressed.  No physical exam could be performed with this format.  Please refer to the patient's chart for his  consent to telehealth for [facility]. ? ?Evaluation Performed:  Preoperative cardiovascular risk assessment ?_____________  ? ?Date:  07/17/2021  ? ?Patient ID:  Ronald [name], Ronald [name] 07/30/1940, MRN [id number] ?Patient Location:  ?Home ?Provider location:   ?Office ? ?Primary Care Provider:  [name], MD ?Primary Cardiologist:  Verne Carrow, MD ? ?Chief Complaint  ?  ?81 y.o. y/o male with a h/o atypical atrial flutter, CAD s/p PCI/DES to RCA 2004, SVT, thoracic aortic aneurysm (stable on CT 02/2021) who is pending colonoscopy, and presents today for telephonic preoperative cardiovascular risk assessment. ? ?Past Medical History  ?  ?Past Medical History:  ?Diagnosis Date  ? Arthritis   ? Coronary artery disease   ? Stent 2004  ? GERD (gastroesophageal reflux disease)   ? History of echocardiogram   ? Echo 5/17: mild LVH, EF 50-55%, no RWMA, Gr 2 DD, mild to mod AI, PASP 32 mmHg  ? History of nuclear stress test   ? Myoview 5/17 - EF 48%, no scar, no ischemia. Low Risk  ? History of pneumonia   ? 81 years old  ? Hx of Bell's palsy   ? left face  ? Hypercholesteremia   ? managed by dr perini  ? Hypertension   ? Ischemic heart disease   ? He  had a stent in his right coronary artery in 2004; He does have a dual ostium of the left coronary system.   ? Lung nodules   ? noted on past CT scans  ? Malaria   ? SVT (supraventricular tachycardia) (HCC)   ? remote episode during exercise test  ? Thoracic aortic aneurysm   ? last scan in Jan 2013. Now measuring 5.0cm. Referred to Dr. Tyrone Sage  ? Wears dentures   ? bottom  ? Wears glasses   ? ?Past Surgical History:  ?Procedure Laterality Date  ? ATRIAL FIBRILLATION ABLATION N/A 05/12/2019  ? Procedure: ATRIAL FIBRILLATION ABLATION;  Surgeon: Hillis Range, MD;  [facility]: [facility];  Service: Cardiovascular;  Laterality: N/A;  ? CARDIOVERSION N/A 03/13/2019  ? Procedure: CARDIOVERSION;  Surgeon: Pricilla Riffle, MD;  Location: [facility] ENDOSCOPY;  Service: Cardiovascular;  Laterality: N/A;  ? CARDIOVERSION N/A 04/15/2019  ? Procedure: CARDIOVERSION;  Surgeon: Vesta Mixer, MD;  Location: [facility] ENDOSCOPY;  Service: Cardiovascular;  Laterality: N/A;  ? CARDIOVERSION N/A 06/16/2019  ? Procedure: CARDIOVERSION;  Surgeon: Chilton Si, MD;  Location: [facility] ENDOSCOPY;  Service: Cardiovascular;  Laterality: N/A;  ? CERVICAL FUSION  03-2010  ? c2-c7  ? COLONOSCOPY    ? x4  ? CORONARY STENT PLACEMENT  2004  ? dental implant    ? x 2  ? ELBOW SURGERY  1997  ? lt and rt  ? EYE SURGERY    ?  HIP PINNING,CANNULATED Right 05/11/2014  ? Procedure: CANNULATED HIP PINNING;  Surgeon: Sheral Apley, MD;  Location: [facility] OR;  Service: Orthopedics;  Laterality: Right;  ? RETINAL DETACHMENT SURGERY  1975  ? lt  ? rotator cuff surgery  1997  ? lt  ? SHOULDER ARTHROSCOPY  2/15  ? right-got cardiac clearance-gsc  ? TONSILLECTOMY    ? TRIGGER FINGER RELEASE Left 10/12/2013  ? Procedure: LEFT LONG FINGER TRIGGER RELEASE;  Surgeon: Jodi Marble, MD;  Location: [facility];  Service: Orthopedics;  Laterality: Left;  ? ? ?Allergies ? ?Allergies  ?Allergen Reactions  ? Quinolones Hypertension  ?  Patient was warned about not using  Cipro and similar antibiotics. ?Recent studies have raised concern that fluoroquinolone antibiotics could be associated with an increased risk of aortic aneurysm ?Fluoroquinolones have non-antimicrobial properties that might jeopardise the integrity of the extracellular matrix of the vascular wall ?In a  propensity score matched cohort study in [place], there was a 66% increased rate of aortic aneurysm or dissection associated with oral fluoroquinolone use, compared wit  ? Promethazine Hcl Other (See Comments)  ?  Agitation and incoherence (phenergan)  ? Vesicare [Solifenacin Succinate] Itching and Rash  ? Atorvastatin   ?  Other reaction(s): Myalgia  ? Ezetimibe-Simvastatin   ?  Other reaction(s): Joints  ? ? ?History of Present Illness  ?  ?Ronald [name] is a 81 y.o. male who presents via audio/video conferencing for a telehealth visit today.  Pt was last seen in cardiology clinic on 03/20/21 by Francis Dowse, PA.  At that time Ronald [name] was doing well.  The patient is now pending coloscopy.  Since his last visit, he denies chest pain, dyspnea, palpitations, or syncope. He is having abdominal pain, constipation, and loss of appetite.  ? ? ?Home Medications  ?  ?Prior to Admission medications   ?Medication Sig Start Date End Date Taking? Authorizing Provider  ?amiodarone (PACERONE) 200 MG tablet TAKE 1 TABLET BY MOUTH TWICE A DAY 03/08/21   Kathleene Hazel, MD  ?chlorthalidone (HYGROTON) 25 MG tablet Take 25 mg by mouth daily.     [provider]  ?clobetasol ointment (TEMOVATE) 0.05 % Apply 1 application topically 2 (two) times daily as needed (itchy scaly on legs).     [provider]  ?Coenzyme Q10 (COQ-10) 200 MG CAPS Take 200 mg by mouth every evening.    [provider]  ?colesevelam (WELCHOL) 625 MG tablet Take 3,750 mg by mouth daily with breakfast.    [provider]  ?ELIQUIS 5 MG TABS tablet TAKE 1 TABLET BY MOUTH TWICE A DAY 01/09/21   Kathleene Hazel, MD  ?ezetimibe (ZETIA) 10 MG tablet Take 10 mg by mouth daily.    [provider]  ?fluticasone (FLONASE) 50 MCG/ACT nasal spray Place 1 spray into both nostrils daily as needed for allergies (sinus congestion).    [provider]  ?Glucosamine HCl 1500 MG TABS Take 3,000 mg by mouth daily.    [provider]  ?hydrocortisone 2.5 % cream Apply 1 application topically as needed (eye brows flakey). Use on head 07/09/14   [provider]  ?Ivermectin (SOOLANTRA EX) Apply 1 application topically daily as needed (Rosacea).     [provider]  ?ketoconazole (NIZORAL) 2 % cream Apply 1 application topically daily as needed for irritation (Corner of mouth (sores)).    [provider]  ?lisinopril (PRINIVIL,ZESTRIL) 40 MG tablet Take 40 mg by  mouth every evening.     [provider]  ?LIVALO 4 MG TABS Take 2 mg by mouth at bedtime.  02/24/19   [provider]  ?Multiple Vitamin (MULTIVITAMIN WITH MINERALS) TABS tablet Take 1 tablet by mouth daily.     [provider]  ?mupirocin ointment (BACTROBAN) 2 % Apply 1 application topically daily as needed (tick bites).    [provider]  ?nitroGLYCERIN (NITROSTAT) 0.4 MG SL tablet Place 1 tablet (0.4 mg total) under the tongue every 5 (five) minutes as needed for chest pain. 04/07/19   Kathleene HazelMcAlhany, Christopher D, MD  ?omeprazole (PRILOSEC) 20 MG capsule Take 20 mg by mouth in the morning and at bedtime. BEFORE BREAKFAST & BEFORE SUPPER    [provider]  ?Vitamin D, Cholecalciferol, 25 MCG (1000 UT) CAPS Take 1,000 Units by mouth daily. ?Patient not taking: Reported on 07/17/2021 02/17/09   [provider]  ? ? ?Physical Exam  ?  ?Vital Signs:  Ronald [name] does not have vital signs available for review today. ? ?Given telephonic nature of communication, physical exam is limited. ?AAOx3. NAD. Normal affect.  Speech and respirations are unlabored. ? ?Accessory  Clinical Findings  ?  ?None ? ?Assessment & Plan  ?  ?1.  Preoperative Cardiovascular Risk Assessment: Patient is doing well from a cardiac perspective and may proceed with procedure without further cardiac testing

## 2021-07-17 NOTE — Telephone Encounter (Signed)
Pt is returning call.  

## 2021-07-17 NOTE — Telephone Encounter (Signed)
Pt agreeable to tele pre op appt today @ 10:20. MED rec and consent are done.  ?

## 2021-07-27 DIAGNOSIS — R110 Nausea: Secondary | ICD-10-CM | POA: Diagnosis not present

## 2021-07-27 DIAGNOSIS — K219 Gastro-esophageal reflux disease without esophagitis: Secondary | ICD-10-CM | POA: Diagnosis not present

## 2021-07-27 DIAGNOSIS — M790 Rheumatism, unspecified: Secondary | ICD-10-CM | POA: Diagnosis not present

## 2021-07-27 DIAGNOSIS — R5383 Other fatigue: Secondary | ICD-10-CM | POA: Diagnosis not present

## 2021-07-27 DIAGNOSIS — I2510 Atherosclerotic heart disease of native coronary artery without angina pectoris: Secondary | ICD-10-CM | POA: Diagnosis not present

## 2021-07-27 DIAGNOSIS — W57XXXD Bitten or stung by nonvenomous insect and other nonvenomous arthropods, subsequent encounter: Secondary | ICD-10-CM | POA: Diagnosis not present

## 2021-07-27 DIAGNOSIS — R109 Unspecified abdominal pain: Secondary | ICD-10-CM | POA: Diagnosis not present

## 2021-07-27 DIAGNOSIS — R748 Abnormal levels of other serum enzymes: Secondary | ICD-10-CM | POA: Diagnosis not present

## 2021-07-27 DIAGNOSIS — S30860D Insect bite (nonvenomous) of lower back and pelvis, subsequent encounter: Secondary | ICD-10-CM | POA: Diagnosis not present

## 2021-07-27 DIAGNOSIS — R634 Abnormal weight loss: Secondary | ICD-10-CM | POA: Diagnosis not present

## 2021-07-28 ENCOUNTER — Ambulatory Visit
Admission: RE | Admit: 2021-07-28 | Discharge: 2021-07-28 | Disposition: A | Discharge status: Home / Self Care | Payer: Medicare PPO | Source: Ambulatory Visit | Attending: Adult Health | Admitting: Adult Health

## 2021-07-28 ENCOUNTER — Other Ambulatory Visit: Payer: Self-pay | Admitting: Adult Health

## 2021-07-28 DIAGNOSIS — K449 Diaphragmatic hernia without obstruction or gangrene: Secondary | ICD-10-CM | POA: Diagnosis not present

## 2021-07-28 DIAGNOSIS — R109 Unspecified abdominal pain: Secondary | ICD-10-CM

## 2021-07-28 DIAGNOSIS — I719 Aortic aneurysm of unspecified site, without rupture: Secondary | ICD-10-CM

## 2021-07-28 DIAGNOSIS — R634 Abnormal weight loss: Secondary | ICD-10-CM

## 2021-07-28 DIAGNOSIS — Q272 Other congenital malformations of renal artery: Secondary | ICD-10-CM | POA: Diagnosis not present

## 2021-07-28 DIAGNOSIS — K5790 Diverticulosis of intestine, part unspecified, without perforation or abscess without bleeding: Secondary | ICD-10-CM | POA: Diagnosis not present

## 2021-07-28 DIAGNOSIS — K5900 Constipation, unspecified: Secondary | ICD-10-CM | POA: Diagnosis not present

## 2021-07-28 MED ORDER — IOPAMIDOL (ISOVUE-370) INJECTION 76%
75.0000 mL | Freq: Once | INTRAVENOUS | Status: AC | PRN
  Administered 2021-07-28: 75 mL via INTRAVENOUS

## 2021-08-03 DIAGNOSIS — H43813 Vitreous degeneration, bilateral: Secondary | ICD-10-CM | POA: Diagnosis not present

## 2021-08-10 DIAGNOSIS — K5730 Diverticulosis of large intestine without perforation or abscess without bleeding: Secondary | ICD-10-CM | POA: Diagnosis not present

## 2021-08-10 DIAGNOSIS — K449 Diaphragmatic hernia without obstruction or gangrene: Secondary | ICD-10-CM | POA: Diagnosis not present

## 2021-08-10 DIAGNOSIS — Z86010 Personal history of colonic polyps: Secondary | ICD-10-CM | POA: Diagnosis not present

## 2021-08-10 DIAGNOSIS — K649 Unspecified hemorrhoids: Secondary | ICD-10-CM | POA: Diagnosis not present

## 2021-08-10 DIAGNOSIS — R1013 Epigastric pain: Secondary | ICD-10-CM | POA: Diagnosis not present

## 2021-08-10 DIAGNOSIS — K635 Polyp of colon: Secondary | ICD-10-CM | POA: Diagnosis not present

## 2021-08-14 DIAGNOSIS — K635 Polyp of colon: Secondary | ICD-10-CM | POA: Diagnosis not present

## 2021-08-21 ENCOUNTER — Ambulatory Visit: Payer: Medicare PPO | Admitting: Cardiovascular Disease

## 2021-08-21 ENCOUNTER — Encounter: Payer: Self-pay | Admitting: Cardiovascular Disease

## 2021-08-21 VITALS — BP 100/60 | HR 65 | Ht 79.0 in

## 2021-08-21 DIAGNOSIS — I7121 Aneurysm of the ascending aorta, without rupture: Secondary | ICD-10-CM | POA: Diagnosis not present

## 2021-08-21 DIAGNOSIS — E7800 Pure hypercholesterolemia, unspecified: Secondary | ICD-10-CM

## 2021-08-21 DIAGNOSIS — I4819 Other persistent atrial fibrillation: Secondary | ICD-10-CM | POA: Diagnosis not present

## 2021-08-21 DIAGNOSIS — I2510 Atherosclerotic heart disease of native coronary artery without angina pectoris: Secondary | ICD-10-CM | POA: Diagnosis not present

## 2021-08-21 DIAGNOSIS — I10 Essential (primary) hypertension: Secondary | ICD-10-CM | POA: Diagnosis not present

## 2021-08-21 DIAGNOSIS — I351 Nonrheumatic aortic (valve) insufficiency: Secondary | ICD-10-CM | POA: Diagnosis not present

## 2021-08-21 NOTE — Patient Instructions (Signed)
Medication Instructions:  ?Your physician has recommended you make the following change in your medication:  ?1.) stop chlorthalidone ? ?*If you need a refill on your cardiac medications before your next appointment, please call your pharmacy* ? ? ?Lab Work: ?None today (BMET prior to CT scan in November) ? ?If you have labs (blood work) drawn today and your tests are completely normal, you will receive your results only by: ?MyChart Message (if you have MyChart) OR ?A paper copy in the mail ?If you have any lab test that is abnormal or we need to change your treatment, we will call you to review the results. ? ? ?Testing/Procedures: ?CT Chest Aorta - due Nov/Dec 2023 ? ? ?Follow-Up: ?At [facility], you and your health needs are our priority.  As part of our continuing mission to provide you with exceptional heart care, we have created designated Provider Care Teams.  These Care Teams include your primary Cardiologist (physician) and Advanced Practice Providers (APPs -  Physician Assistants and Nurse Practitioners) who all work together to provide you with the care you need, when you need it. ? ?We recommend signing up for the patient portal called "MyChart".  Sign up information is provided on this After Visit Summary.  MyChart is used to connect with patients for Virtual Visits (Telemedicine).  Patients are able to view lab/test results, encounter notes, upcoming appointments, etc.  Non-urgent messages can be sent to your provider as well.   ?To learn more about what you can do with MyChart, go to [email or web address].   ? ?Your next appointment:   ?12 month(s) ? ?The format for your next appointment:   ?In Person ? ?Provider:   ?Verne Carrow, MD   ? ? ?Other Instructions ? ? ?Important Information About Sugar ? ? ? ? ?  ?

## 2021-08-21 NOTE — Progress Notes (Signed)
? ?Chief Complaint  ?Patient presents with  ? Follow-up  ?  CAD  ? ?History of Present Illness: 81 yo male with h/o CAD s/p stent RCA 2004, HTN, HLD, thoracic aortic aneurysm and persistent atrial fibrillation here today for cardiac follow up. He has a thoracic aortic aneurysm and is followed yearly by CT surgery with yearly chest CT scans. He is known to have CAD and had a stent placed in the RCA in 2004. Last cardiac cath in 2013 with mild disease in the LAD and Circumflex, patent stent proximal RCA and moderate disease in the small PDA. He has not tolerated statins due to muscle aches or beta blockers secondary to bradycardia. Nuclear stress test 08/16/15 with no ischemia. Echo May 2017 with normal LV systolic function, mild to moderate AI. New onset atrial fibrillation November 2020. He was started on Eliquis. Echo November 2020 with LVEF=50-55%, mild AI. He was cardioverted to sinus 03/13/19 but converted back to atrial fib. He was started on amiodarone and had repeat cardioversion 04/15/19 but did not maintain sinus rhythm. He is now followed in the atrial fibrillation clinic and is post AF ablation in February 2021. Cardioversion in March 2021. He was cardioverted from atrial flutter in the ED March 2022. Low risk nuclear stress test April 2022. Echo April 2022 with LVEF=55-60%, mild AI. Dilated aortic root. He was seen in the ED 02/07/21 with chest pain. Troponin negative. Chest CTA 02/07/21 with no changes in the size of his ascending aorta. No evidence of aortic dissection.  ? ?He is here today for follow up. The patient denies any chest pain, dyspnea, palpitations, lower extremity edema, orthopnea, PND, dizziness, near syncope or syncope.  ? ?Primary Care Physician: [name], MD ? ?Past Medical History:  ?Diagnosis Date  ? Arthritis   ? Coronary artery disease   ? Stent 2004  ? GERD (gastroesophageal reflux disease)   ? History of echocardiogram   ? Echo 5/17: mild LVH, EF 50-55%, no RWMA, Gr 2 DD, mild to  mod AI, PASP 32 mmHg  ? History of nuclear stress test   ? Myoview 5/17 - EF 48%, no scar, no ischemia. Low Risk  ? History of pneumonia   ? 81 years old  ? Hx of Bell's palsy   ? left face  ? Hypercholesteremia   ? managed by dr perini  ? Hypertension   ? Ischemic heart disease   ? He had a stent in his right coronary artery in 2004; He does have a dual ostium of the left coronary system.   ? Lung nodules   ? noted on past CT scans  ? Malaria   ? SVT (supraventricular tachycardia) (HCC)   ? remote episode during exercise test  ? Thoracic aortic aneurysm (HCC)   ? last scan in Jan 2013. Now measuring 5.0cm. Referred to Dr. Tyrone Sage  ? Wears dentures   ? bottom  ? Wears glasses   ? ? ?Past Surgical History:  ?Procedure Laterality Date  ? ATRIAL FIBRILLATION ABLATION N/A 05/12/2019  ? Procedure: ATRIAL FIBRILLATION ABLATION;  Surgeon: Hillis Range, MD;  Location: [facility] INVASIVE CV LAB;  Service: Cardiovascular;  Laterality: N/A;  ? CARDIOVERSION N/A 03/13/2019  ? Procedure: CARDIOVERSION;  Surgeon: Pricilla Riffle, MD;  Location: [facility] ENDOSCOPY;  Service: Cardiovascular;  Laterality: N/A;  ? CARDIOVERSION N/A 04/15/2019  ? Procedure: CARDIOVERSION;  Surgeon: Vesta Mixer, MD;  Location: [facility] ENDOSCOPY;  Service: Cardiovascular;  Laterality: N/A;  ? CARDIOVERSION N/A 06/16/2019  ?  Procedure: CARDIOVERSION;  Surgeon: Chilton Siandolph, Tiffany, MD;  [facility];  Service: Cardiovascular;  Laterality: N/A;  ? CERVICAL FUSION  03-2010  ? c2-c7  ? COLONOSCOPY    ? x4  ? CORONARY STENT PLACEMENT  2004  ? dental implant    ? x 2  ? ELBOW SURGERY  1997  ? lt and rt  ? EYE SURGERY    ? HIP PINNING,CANNULATED Right 05/11/2014  ? Procedure: CANNULATED HIP PINNING;  Surgeon: Sheral Apleyimothy D Murphy, MD;  [facility];  Service: Orthopedics;  Laterality: Right;  ? RETINAL DETACHMENT SURGERY  1975  ? lt  ? rotator cuff surgery  1997  ? lt  ? SHOULDER ARTHROSCOPY  2/15  ? right-got cardiac clearance-gsc  ? TONSILLECTOMY    ? TRIGGER FINGER RELEASE  Left 10/12/2013  ? Procedure: LEFT LONG FINGER TRIGGER RELEASE;  Surgeon: Jodi Marbleavid A Thompson, MD;  Location: [facility];  Service: Orthopedics;  Laterality: Left;  ? ? ?Current Outpatient Medications  ?Medication Sig Dispense Refill  ? amiodarone (PACERONE) 200 MG tablet TAKE 1 TABLET BY MOUTH TWICE A DAY 180 tablet 3  ? clobetasol ointment (TEMOVATE) 0.05 % Apply 1 application topically 2 (two) times daily as needed (itchy scaly on legs).     ? Coenzyme Q10 (COQ-10) 200 MG CAPS Take 200 mg by mouth every evening.    ? colesevelam (WELCHOL) 625 MG tablet Take 3,750 mg by mouth daily with breakfast.    ? ELIQUIS 5 MG TABS tablet TAKE 1 TABLET BY MOUTH TWICE A DAY 60 tablet 8  ? ezetimibe (ZETIA) 10 MG tablet Take 10 mg by mouth daily.    ? fluticasone (FLONASE) 50 MCG/ACT nasal spray Place 1 spray into both nostrils daily as needed for allergies (sinus congestion).    ? Glucosamine HCl 1500 MG TABS Take 3,000 mg by mouth daily.    ? hydrocortisone 2.5 % cream Apply 1 application topically as needed (eye brows flakey). Use on head    ? Ivermectin (SOOLANTRA EX) Apply 1 application topically daily as needed (Rosacea).     ? ketoconazole (NIZORAL) 2 % cream Apply 1 application topically daily as needed for irritation (Corner of mouth (sores)).    ? lisinopril (PRINIVIL,ZESTRIL) 40 MG tablet Take 40 mg by mouth every evening.     ? LIVALO 4 MG TABS Take 2 mg by mouth at bedtime.     ? Multiple Vitamin (MULTIVITAMIN WITH MINERALS) TABS tablet Take 1 tablet by mouth daily.     ? mupirocin ointment (BACTROBAN) 2 % Apply 1 application topically daily as needed (tick bites).    ? nitroGLYCERIN (NITROSTAT) 0.4 MG SL tablet Place 1 tablet (0.4 mg total) under the tongue every 5 (five) minutes as needed for chest pain. 25 tablet 1  ? omeprazole (PRILOSEC) 20 MG capsule Take 20 mg by mouth in the morning and at bedtime. BEFORE BREAKFAST & BEFORE SUPPER    ? Vitamin D, Cholecalciferol, 25 MCG (1000 UT) CAPS Take 1,000  Units by mouth daily. (Patient not taking: Reported on 07/17/2021)    ? ?No current facility-administered medications for this visit.  ? ? ?Allergies  ?Allergen Reactions  ? Quinolones Hypertension  ?  Patient was warned about not using Cipro and similar antibiotics. ?Recent studies have raised concern that fluoroquinolone antibiotics could be associated with an increased risk of aortic aneurysm ?Fluoroquinolones have non-antimicrobial properties that might jeopardise the integrity of the extracellular matrix of the vascular wall ?In a  propensity score matched cohort study in [place], there was a 66% increased rate of aortic aneurysm or dissection associated with oral fluoroquinolone use, compared wit  ? Promethazine Hcl Other (See Comments)  ?  Agitation and incoherence (phenergan)  ? Vesicare [Solifenacin Succinate] Itching and Rash  ? Atorvastatin   ?  Other reaction(s): Myalgia  ? Ezetimibe-Simvastatin   ?  Other reaction(s): Joints  ? ? ?Social History  ? ?Socioeconomic History  ? Marital status: Married  ?  Spouse name: Not on file  ? Number of children: 1  ? Years of education: Not on file  ? Highest education level: Not on file  ?Occupational History  ? Occupation: retired Engineer, site  ?  Employer: RETIRED  ?Tobacco Use  ? Smoking status: Former  ? Smokeless tobacco: Current  ?  Types: Chew  ? Tobacco comments:  ?  reports that when he smoked it was intermittent  ?Vaping Use  ? Vaping Use: Unknown  ?Substance and Sexual Activity  ? Alcohol use: Yes  ?  Alcohol/week: 1.0 standard drink  ?  Types: 1 Standard drinks or equivalent per week  ?  Comment: occasional mainly during holiday  ? Drug use: No  ? Sexual activity: Yes  ?Other Topics Concern  ? Not on file  ?Social History Narrative  ? Lives in [place]  ? 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 ?Social Determinants of Health  ? ?Financial Resource Strain: Not on file  ?Food Insecurity: Not on file   ?Transportation Needs: Not on file  ?Physical Activity: Not on file  ?Stress: Not on file  ?Social Connections: Not on file  ?Intimate Partner Violence: Not on file  ? ? ?Family History  ?Problem Relati

## 2021-08-31 ENCOUNTER — Ambulatory Visit: Payer: Medicare PPO | Admitting: Nurse Practitioner

## 2021-09-11 DIAGNOSIS — E118 Type 2 diabetes mellitus with unspecified complications: Secondary | ICD-10-CM | POA: Diagnosis not present

## 2021-09-11 DIAGNOSIS — I2510 Atherosclerotic heart disease of native coronary artery without angina pectoris: Secondary | ICD-10-CM | POA: Diagnosis not present

## 2021-09-11 DIAGNOSIS — D6869 Other thrombophilia: Secondary | ICD-10-CM | POA: Diagnosis not present

## 2021-09-11 DIAGNOSIS — K5900 Constipation, unspecified: Secondary | ICD-10-CM | POA: Diagnosis not present

## 2021-09-11 DIAGNOSIS — R110 Nausea: Secondary | ICD-10-CM | POA: Diagnosis not present

## 2021-09-11 DIAGNOSIS — E785 Hyperlipidemia, unspecified: Secondary | ICD-10-CM | POA: Diagnosis not present

## 2021-09-11 DIAGNOSIS — W57XXXD Bitten or stung by nonvenomous insect and other nonvenomous arthropods, subsequent encounter: Secondary | ICD-10-CM | POA: Diagnosis not present

## 2021-09-11 DIAGNOSIS — S30860D Insect bite (nonvenomous) of lower back and pelvis, subsequent encounter: Secondary | ICD-10-CM | POA: Diagnosis not present

## 2021-09-11 DIAGNOSIS — R109 Unspecified abdominal pain: Secondary | ICD-10-CM | POA: Diagnosis not present

## 2021-09-22 ENCOUNTER — Other Ambulatory Visit: Payer: Self-pay | Admitting: Cardiovascular Disease

## 2021-09-22 NOTE — Telephone Encounter (Signed)
Prescription refill request for Eliquis received. Indication: afib  Last office visit: Mcalhany 08/21/2021 Scr: 1.12, 06/26/2021 Age: 81 Weight: 127 kg   Refill sent.

## 2021-10-03 DIAGNOSIS — R109 Unspecified abdominal pain: Secondary | ICD-10-CM | POA: Diagnosis not present

## 2021-10-03 DIAGNOSIS — K5900 Constipation, unspecified: Secondary | ICD-10-CM | POA: Diagnosis not present

## 2021-10-30 DIAGNOSIS — R109 Unspecified abdominal pain: Secondary | ICD-10-CM | POA: Diagnosis not present

## 2021-10-30 DIAGNOSIS — K5900 Constipation, unspecified: Secondary | ICD-10-CM | POA: Diagnosis not present

## 2021-10-30 DIAGNOSIS — R1013 Epigastric pain: Secondary | ICD-10-CM | POA: Diagnosis not present

## 2021-10-30 DIAGNOSIS — R634 Abnormal weight loss: Secondary | ICD-10-CM | POA: Diagnosis not present

## 2021-10-30 DIAGNOSIS — R350 Frequency of micturition: Secondary | ICD-10-CM | POA: Diagnosis not present

## 2021-10-30 DIAGNOSIS — R5383 Other fatigue: Secondary | ICD-10-CM | POA: Diagnosis not present

## 2021-10-30 DIAGNOSIS — M6208 Separation of muscle (nontraumatic), other site: Secondary | ICD-10-CM | POA: Diagnosis not present

## 2021-10-31 ENCOUNTER — Encounter (HOSPITAL_COMMUNITY): Payer: Self-pay

## 2021-10-31 ENCOUNTER — Emergency Department (HOSPITAL_COMMUNITY)
Admission: EM | Admit: 2021-10-31 | Discharge: 2021-10-31 | Disposition: A | Discharge status: Home / Self Care | Payer: Medicare PPO | Attending: Emergency Medicine | Admitting: Emergency Medicine

## 2021-10-31 ENCOUNTER — Ambulatory Visit
Admission: RE | Admit: 2021-10-31 | Discharge: 2021-10-31 | Disposition: A | Discharge status: Home / Self Care | Payer: Medicare PPO | Source: Ambulatory Visit | Attending: Physician Assistant | Admitting: Physician Assistant

## 2021-10-31 ENCOUNTER — Other Ambulatory Visit: Payer: Self-pay | Admitting: Physician Assistant

## 2021-10-31 DIAGNOSIS — Z7984 Long term (current) use of oral hypoglycemic drugs: Secondary | ICD-10-CM | POA: Insufficient documentation

## 2021-10-31 DIAGNOSIS — K5900 Constipation, unspecified: Secondary | ICD-10-CM | POA: Diagnosis not present

## 2021-10-31 DIAGNOSIS — R1084 Generalized abdominal pain: Secondary | ICD-10-CM

## 2021-10-31 DIAGNOSIS — R1013 Epigastric pain: Secondary | ICD-10-CM | POA: Diagnosis not present

## 2021-10-31 DIAGNOSIS — R739 Hyperglycemia, unspecified: Secondary | ICD-10-CM

## 2021-10-31 DIAGNOSIS — Z79899 Other long term (current) drug therapy: Secondary | ICD-10-CM | POA: Diagnosis not present

## 2021-10-31 DIAGNOSIS — R5383 Other fatigue: Secondary | ICD-10-CM | POA: Diagnosis not present

## 2021-10-31 DIAGNOSIS — E1165 Type 2 diabetes mellitus with hyperglycemia: Secondary | ICD-10-CM | POA: Diagnosis not present

## 2021-10-31 DIAGNOSIS — Z7901 Long term (current) use of anticoagulants: Secondary | ICD-10-CM | POA: Insufficient documentation

## 2021-10-31 DIAGNOSIS — R350 Frequency of micturition: Secondary | ICD-10-CM | POA: Diagnosis not present

## 2021-10-31 DIAGNOSIS — R109 Unspecified abdominal pain: Secondary | ICD-10-CM | POA: Diagnosis not present

## 2021-10-31 LAB — COMPREHENSIVE METABOLIC PANEL
ALT: 41 U/L (ref 0–44)
AST: 31 U/L (ref 15–41)
Albumin: 3.9 g/dL (ref 3.5–5.0)
Alkaline Phosphatase: 110 U/L (ref 38–126)
Anion gap: 11 (ref 5–15)
BUN: 31 mg/dL — ABNORMAL HIGH (ref 8–23)
CO2: 26 mmol/L (ref 22–32)
Calcium: 9.3 mg/dL (ref 8.9–10.3)
Chloride: 101 mmol/L (ref 98–111)
Creatinine, Ser: 1.37 mg/dL — ABNORMAL HIGH (ref 0.61–1.24)
GFR, Estimated: 53 mL/min — ABNORMAL LOW (ref >60)
Glucose, Bld: 386 mg/dL — ABNORMAL HIGH (ref 70–99)
Potassium: 4.6 mmol/L (ref 3.5–5.1)
Sodium: 138 mmol/L (ref 135–145)
Total Bilirubin: 0.8 mg/dL (ref 0.3–1.2)
Total Protein: 6.9 g/dL (ref 6.5–8.1)

## 2021-10-31 LAB — URINALYSIS, ROUTINE W REFLEX MICROSCOPIC
Bacteria, UA: NONE SEEN
Bilirubin Urine: NEGATIVE
Glucose, UA: >=500 mg/dL — AB
Hgb urine dipstick: NEGATIVE
Ketones, ur: 20 mg/dL — AB
Leukocytes,Ua: NEGATIVE
Nitrite: NEGATIVE
Protein, ur: NEGATIVE mg/dL
Specific Gravity, Urine: 1.032 — ABNORMAL HIGH (ref 1.005–1.030)
pH: 5 (ref 5.0–8.0)

## 2021-10-31 LAB — CBC WITH DIFFERENTIAL/PLATELET
Abs Immature Granulocytes: 0.02 10*3/uL (ref 0.00–0.07)
Basophils Absolute: 0 10*3/uL (ref 0.0–0.1)
Basophils Relative: 1 %
Eosinophils Absolute: 0 10*3/uL (ref 0.0–0.5)
Eosinophils Relative: 1 %
HCT: 45.3 % (ref 39.0–52.0)
Hemoglobin: 15.3 g/dL (ref 13.0–17.0)
Immature Granulocytes: 0 %
Lymphocytes Relative: 23 %
Lymphs Abs: 1.4 10*3/uL (ref 0.7–4.0)
MCH: 30.8 pg (ref 26.0–34.0)
MCHC: 33.8 g/dL (ref 30.0–36.0)
MCV: 91.1 fL (ref 80.0–100.0)
Monocytes Absolute: 0.8 10*3/uL (ref 0.1–1.0)
Monocytes Relative: 13 %
Neutro Abs: 3.8 10*3/uL (ref 1.7–7.7)
Neutrophils Relative %: 62 %
Platelets: 204 10*3/uL (ref 150–400)
RBC: 4.97 MIL/uL (ref 4.22–5.81)
RDW: 12.5 % (ref 11.5–15.5)
WBC: 6.1 10*3/uL (ref 4.0–10.5)
nRBC: 0 % (ref 0.0–0.2)

## 2021-10-31 LAB — CBG MONITORING, ED
Glucose-Capillary: 346 mg/dL — ABNORMAL HIGH (ref 70–99)
Glucose-Capillary: 326 mg/dL — ABNORMAL HIGH (ref 70–99)
Glucose-Capillary: 319 mg/dL — ABNORMAL HIGH (ref 70–99)
Glucose-Capillary: 310 mg/dL — ABNORMAL HIGH (ref 70–99)
Glucose-Capillary: 160 mg/dL — ABNORMAL HIGH (ref 70–99)
Glucose-Capillary: 103 mg/dL — ABNORMAL HIGH (ref 70–99)

## 2021-10-31 MED ORDER — INSULIN ASPART 100 UNIT/ML IJ SOLN
10.0000 [IU] | Freq: Once | INTRAMUSCULAR | Status: AC
  Administered 2021-10-31: 10 [IU] via SUBCUTANEOUS
  Filled 2021-10-31: qty 0.1

## 2021-10-31 MED ORDER — BLOOD GLUCOSE MONITOR KIT: PACK | 0 refills | Status: AC

## 2021-10-31 MED ORDER — METFORMIN HCL 1000 MG PO TABS: 1000.0000 mg | ORAL_TABLET | Freq: Two times a day (BID) | ORAL | 0 refills | Status: DC

## 2021-10-31 MED ORDER — INSULIN ASPART 100 UNIT/ML IJ SOLN
12.0000 [IU] | Freq: Once | INTRAMUSCULAR | Status: AC
Start: 2021-10-31 — End: 2021-10-31
  Administered 2021-10-31: 12 [IU] via SUBCUTANEOUS
  Filled 2021-10-31: qty 0.12

## 2021-10-31 MED ORDER — SODIUM CHLORIDE 0.9 % IV BOLUS
1000.0000 mL | Freq: Once | INTRAVENOUS | Status: AC
  Administered 2021-10-31: 1000 mL via INTRAVENOUS

## 2021-10-31 NOTE — ED Notes (Addendum)
Unable to obtain BP. On monitor and Dinamap. Triage RN aware.

## 2021-10-31 NOTE — ED Triage Notes (Signed)
Pt presents with c/o hyperglycemia. Pt reports he has been feeling unwell, having some abdominal pain, and reports that he had blood work done and was told to come here for high blood sugar.

## 2021-10-31 NOTE — ED Provider Triage Note (Signed)
Emergency Medicine Provider Triage Evaluation Note  KEIONTE SWICEGOOD , a 81 y.o. male  was evaluated in triage.  Pt complains of hyperglycemia, was recommended by his GI specialist this morning to come to the ED for further evaluation.  Also complaining of polydipsia and polyuria.  Denies fevers or diarrhea, but endorses some constipation over the last month or 2.  Denies shortness of breath, chest pain, or dizziness.  Review of Systems  Positive:  Negative: See above  Physical Exam  Pulse 72   Temp 97.8 F (36.6 C) (Oral)   Resp 18   Ht 6\' 7"  (2.007 m)   Wt 111.1 kg   SpO2 97%   BMI 27.60 kg/m  Gen:   Awake, no distress   Resp:  Normal effort  MSK:   Moves extremities without difficulty  Other:  Abdomen soft, mild diffuse tenderness.  Medical Decision Making  Medically screening exam initiated at 2:34 PM.  Appropriate orders placed.  KONSTANTINE GERVASI was informed that the remainder of the evaluation will be completed by another provider, this initial triage assessment does not replace that evaluation, and the importance of remaining in the ED until their evaluation is complete.     [name], PA-C 10/31/21 1437

## 2021-10-31 NOTE — ED Provider Notes (Signed)
[facility] DEPT Provider Note   CSN: [id number] Arrival date & time: 10/31/21  1416     History  Chief Complaint  Patient presents with   Hyperglycemia    Ronald [name] is a 81 y.o. male.  Patient presents to ER chief complaint of abnormal labs.  He states he went to see his gastroenterologist for chronic constipation as he been dealing with for months, had labs done and was told he had elevated blood sugars to go to the ER.  However this was not a fasting test as he had eaten breakfast before lab work.  States that has been having increased urination for the past week.  Otherwise denies headache or chest pain no abdominal pain no fever no cough no vomiting or diarrhea reported.       Home Medications Prior to Admission medications   Medication Sig Start Date End Date Taking? Authorizing Provider  amiodarone (PACERONE) 200 MG tablet TAKE 1 TABLET BY MOUTH TWICE A DAY 03/08/21  Yes Burnell Blanks, MD  apixaban (ELIQUIS) 5 MG TABS tablet TAKE 1 TABLET BY MOUTH TWICE A DAY 09/22/21  Yes Burnell Blanks, MD  blood glucose meter kit and supplies KIT Dispense based on patient and insurance preference. Use up to four times daily as directed. 10/31/21  Yes Luna Fuse, MD  chlorthalidone (HYGROTON) 25 MG tablet Take 12.5 mg by mouth every morning. 09/05/21  Yes [provider]  clobetasol ointment (TEMOVATE) 6.12 % Apply 1 application topically 2 (two) times daily as needed (itchy scaly on legs).    Yes [provider]  Coenzyme Q10 (COQ-10) 200 MG CAPS Take 200 mg by mouth every morning.   Yes [provider]  ezetimibe (ZETIA) 10 MG tablet Take 10 mg by mouth every evening.   Yes [provider]  fluticasone (FLONASE) 50 MCG/ACT nasal spray Place 1 spray into both nostrils daily as needed for allergies (sinus congestion).   Yes [provider]  Glucosamine HCl 1500 MG TABS Take 3,000 mg by mouth  daily.   Yes [provider]  hydrocortisone 2.5 % cream Apply 1 application topically as needed (eye brows flakey). Use on head 07/09/14  Yes [provider]  Ivermectin (SOOLANTRA EX) Apply 1 application topically daily as needed (Rosacea).    Yes [provider]  ketoconazole (NIZORAL) 2 % cream Apply 1 application topically daily as needed for irritation (Corner of mouth (sores)).   Yes [provider]  lisinopril (PRINIVIL,ZESTRIL) 40 MG tablet Take 10 mg by mouth every evening. Take 1/4 tablet (10 mg) daily   Yes [provider]  LIVALO 4 MG TABS Take 2 mg by mouth at bedtime.  02/24/19  Yes [provider]  metFORMIN (GLUCOPHAGE) 1000 MG tablet Take 1 tablet (1,000 mg total) by mouth 2 (two) times daily. 10/31/21  Yes Luna Fuse, MD  Multiple Vitamin (MULTIVITAMIN WITH MINERALS) TABS tablet Take 1 tablet by mouth daily.    Yes [provider]  mupirocin ointment (BACTROBAN) 2 % Apply 1 application topically daily as needed (tick bites).   Yes [provider]  nitroGLYCERIN (NITROSTAT) 0.4 MG SL tablet Place 1 tablet (0.4 mg total) under the tongue every 5 (five) minutes as needed for chest pain. 04/07/19  Yes Burnell Blanks, MD      Allergies    Quinolones, Promethazine hcl, Vesicare [solifenacin succinate], Atorvastatin, and Ezetimibe-simvastatin    Review of Systems   Review of  Systems  Constitutional:  Negative for fever.  HENT:  Negative for ear pain and sore throat.   Eyes:  Negative for pain.  Respiratory:  Negative for cough.   Cardiovascular:  Negative for chest pain.  Gastrointestinal:  Positive for constipation.  Genitourinary:  Negative for flank pain.  Musculoskeletal:  Negative for back pain.  Skin:  Negative for color change and rash.  Neurological:  Negative for syncope.  All other systems reviewed and are negative.   Physical Exam Updated Vital Signs BP 118/68   Pulse (!) 53    Temp 98.1 F (36.7 C) (Oral)   Resp (!) 22   Ht _0  (2.007 m)   Wt 111.1 kg   SpO2 96%   BMI 27.60 kg/m  Physical Exam Constitutional:      Appearance: He is well-developed.  HENT:     Head: Normocephalic.     Nose: Nose normal.  Eyes:     Extraocular Movements: Extraocular movements intact.  Cardiovascular:     Rate and Rhythm: Normal rate.  Pulmonary:     Effort: Pulmonary effort is normal.  Abdominal:     General: There is no distension.     Tenderness: There is no abdominal tenderness. There is no guarding.  Skin:    Coloration: Skin is not jaundiced.  Neurological:     Mental Status: He is alert. Mental status is at baseline.     ED Results / Procedures / Treatments   Labs (all labs ordered are listed, but only abnormal results are displayed) Labs Reviewed  COMPREHENSIVE METABOLIC PANEL - Abnormal; Notable for the following components:      Result Value   Glucose, Bld 386 (*)    BUN 31 (*)    Creatinine, Ser 1.37 (*)    GFR, Estimated 53 (*)    All other components within normal limits  URINALYSIS, ROUTINE W REFLEX MICROSCOPIC - Abnormal; Notable for the following components:   Specific Gravity, Urine 1.032 (*)    Glucose, UA >=500 (*)    Ketones, ur 20 (*)    All other components within normal limits  CBG MONITORING, ED - Abnormal; Notable for the following components:   Glucose-Capillary 346 (*)    All other components within normal limits  CBG MONITORING, ED - Abnormal; Notable for the following components:   Glucose-Capillary 326 (*)    All other components within normal limits  CBG MONITORING, ED - Abnormal; Notable for the following components:   Glucose-Capillary 319 (*)    All other components within normal limits  CBG MONITORING, ED - Abnormal; Notable for the following components:   Glucose-Capillary 310 (*)    All other components within normal limits  CBG MONITORING, ED - Abnormal; Notable for the following components:   Glucose-Capillary  160 (*)    All other components within normal limits  CBG MONITORING, ED - Abnormal; Notable for the following components:   Glucose-Capillary 103 (*)    All other components within normal limits  CBC WITH DIFFERENTIAL/PLATELET    EKG None  Radiology DG Abd 1 View  Result Date: 10/31/2021 CLINICAL DATA:  Abdominal pain and weight loss. EXAM: ABDOMEN - 1 VIEW COMPARISON:  CT abdomen pelvis June 12, 2018 FINDINGS: Lung bases are clear. Nonobstructed bowel-gas pattern. Postsurgical changes proximal right femur. Lumbar spine degenerative changes. Left hip joint degenerative changes. Stable sclerotic lesion within the left ilium IMPRESSION: Nonobstructed bowel-gas pattern. Electronically Signed   By: Lovey Newcomer M.D.   On:  10/31/2021 14:41    Procedures Procedures    Medications Ordered in ED Medications  insulin aspart (novoLOG) injection 12 Units (12 Units Subcutaneous Given 10/31/21 1657)  insulin aspart (novoLOG) injection 10 Units (10 Units Subcutaneous Given 10/31/21 1811)  sodium chloride 0.9 % bolus 1,000 mL (0 mLs Intravenous Stopped 10/31/21 2133)  insulin aspart (novoLOG) injection 10 Units (10 Units Subcutaneous Given 10/31/21 1934)    ED Course/ Medical Decision Making/ A&P                           Medical Decision Making Risk Prescription drug management.   Review of records shows visit yesterday for abdominal pain.  Discharged home in stable condition.  History from family bedside.  Diagnostic testing reviewed chemistry shows glucose of 386.  CBC normal urinalysis shows ketones but otherwise unremarkable.  Patient given IV fluids and multiple doses of insulin blood sugar now in the 100 range and appears steady.  Patient tolerating oral intake here.  Will be discharged home with metformin.  Advised outpatient follow-up with his primary care doctor doctor this week.  Advised immediate return for worsening symptoms fevers pain or persistently uncontrolled blood glucose  levels.  Patient was understanding, discharged home in stable condition.        Final Clinical Impression(s) / ED Diagnoses Final diagnoses:  Hyperglycemia    Rx / DC Orders ED Discharge Orders          Ordered    blood glucose meter kit and supplies KIT        10/31/21 2142    metFORMIN (GLUCOPHAGE) 1000 MG tablet  2 times daily        10/31/21 2142              Luna Fuse, MD 10/31/21 2142

## 2021-10-31 NOTE — Discharge Instructions (Addendum)
Check your blood sugars daily.  Take the medications as written.  Follow-up with your doctor this week as you may require additional medications to ultimately control your blood sugars.  Return back to the ER if your sugars continue to run high if you have fevers if you have pain or if you have any additional concerns.

## 2021-11-01 ENCOUNTER — Telehealth: Payer: Self-pay

## 2021-11-01 NOTE — Telephone Encounter (Signed)
RNCM received inbound call from [facility] with [facility]. Pharmacy reports patient was prescribed a Accuchek guide machine however the lancets and test strips were not ordered.  Patient needs Accucheck guide test strips and soft click lancets.   RNCM will follow up with pharmacy

## 2021-11-03 DIAGNOSIS — I259 Chronic ischemic heart disease, unspecified: Secondary | ICD-10-CM | POA: Diagnosis not present

## 2021-11-03 DIAGNOSIS — N1831 Chronic kidney disease, stage 3a: Secondary | ICD-10-CM | POA: Diagnosis not present

## 2021-11-03 DIAGNOSIS — E118 Type 2 diabetes mellitus with unspecified complications: Secondary | ICD-10-CM | POA: Diagnosis not present

## 2021-11-03 DIAGNOSIS — R739 Hyperglycemia, unspecified: Secondary | ICD-10-CM | POA: Diagnosis not present

## 2021-11-03 DIAGNOSIS — I4891 Unspecified atrial fibrillation: Secondary | ICD-10-CM | POA: Diagnosis not present

## 2021-11-03 DIAGNOSIS — I129 Hypertensive chronic kidney disease with stage 1 through stage 4 chronic kidney disease, or unspecified chronic kidney disease: Secondary | ICD-10-CM | POA: Diagnosis not present

## 2021-11-03 DIAGNOSIS — E785 Hyperlipidemia, unspecified: Secondary | ICD-10-CM | POA: Diagnosis not present

## 2021-11-07 DIAGNOSIS — L570 Actinic keratosis: Secondary | ICD-10-CM | POA: Diagnosis not present

## 2021-11-07 DIAGNOSIS — D1801 Hemangioma of skin and subcutaneous tissue: Secondary | ICD-10-CM | POA: Diagnosis not present

## 2021-11-07 DIAGNOSIS — Z85828 Personal history of other malignant neoplasm of skin: Secondary | ICD-10-CM | POA: Diagnosis not present

## 2021-11-07 DIAGNOSIS — L821 Other seborrheic keratosis: Secondary | ICD-10-CM | POA: Diagnosis not present

## 2021-11-07 DIAGNOSIS — L820 Inflamed seborrheic keratosis: Secondary | ICD-10-CM | POA: Diagnosis not present

## 2021-11-10 ENCOUNTER — Other Ambulatory Visit: Payer: Self-pay | Admitting: Internal Medicine

## 2021-11-10 ENCOUNTER — Other Ambulatory Visit (HOSPITAL_COMMUNITY): Payer: Self-pay | Admitting: Internal Medicine

## 2021-11-10 DIAGNOSIS — R110 Nausea: Secondary | ICD-10-CM

## 2021-11-10 DIAGNOSIS — R1030 Lower abdominal pain, unspecified: Secondary | ICD-10-CM

## 2021-11-11 ENCOUNTER — Ambulatory Visit (HOSPITAL_COMMUNITY)
Admission: RE | Admit: 2021-11-11 | Discharge: 2021-11-11 | Disposition: A | Discharge status: Home / Self Care | Payer: Medicare PPO | Source: Ambulatory Visit | Attending: Internal Medicine | Admitting: Internal Medicine

## 2021-11-11 DIAGNOSIS — R1030 Lower abdominal pain, unspecified: Secondary | ICD-10-CM | POA: Diagnosis not present

## 2021-11-11 DIAGNOSIS — I700 Atherosclerosis of aorta: Secondary | ICD-10-CM | POA: Diagnosis not present

## 2021-11-11 DIAGNOSIS — R110 Nausea: Secondary | ICD-10-CM | POA: Diagnosis not present

## 2021-11-11 MED ORDER — IOHEXOL 300 MG/ML  SOLN
100.0000 mL | Freq: Once | INTRAMUSCULAR | Status: AC | PRN
  Administered 2021-11-11: 100 mL via INTRAVENOUS

## 2021-11-15 DIAGNOSIS — R109 Unspecified abdominal pain: Secondary | ICD-10-CM | POA: Diagnosis not present

## 2021-11-15 DIAGNOSIS — N289 Disorder of kidney and ureter, unspecified: Secondary | ICD-10-CM | POA: Diagnosis not present

## 2021-11-15 DIAGNOSIS — K5900 Constipation, unspecified: Secondary | ICD-10-CM | POA: Diagnosis not present

## 2021-11-15 DIAGNOSIS — E119 Type 2 diabetes mellitus without complications: Secondary | ICD-10-CM | POA: Diagnosis not present

## 2021-11-20 DIAGNOSIS — D6869 Other thrombophilia: Secondary | ICD-10-CM | POA: Diagnosis not present

## 2021-11-20 DIAGNOSIS — K219 Gastro-esophageal reflux disease without esophagitis: Secondary | ICD-10-CM | POA: Diagnosis not present

## 2021-11-20 DIAGNOSIS — I129 Hypertensive chronic kidney disease with stage 1 through stage 4 chronic kidney disease, or unspecified chronic kidney disease: Secondary | ICD-10-CM | POA: Diagnosis not present

## 2021-11-20 DIAGNOSIS — I2510 Atherosclerotic heart disease of native coronary artery without angina pectoris: Secondary | ICD-10-CM | POA: Diagnosis not present

## 2021-11-20 DIAGNOSIS — E785 Hyperlipidemia, unspecified: Secondary | ICD-10-CM | POA: Diagnosis not present

## 2021-11-20 DIAGNOSIS — E118 Type 2 diabetes mellitus with unspecified complications: Secondary | ICD-10-CM | POA: Diagnosis not present

## 2021-11-20 DIAGNOSIS — I872 Venous insufficiency (chronic) (peripheral): Secondary | ICD-10-CM | POA: Diagnosis not present

## 2021-11-20 DIAGNOSIS — I4891 Unspecified atrial fibrillation: Secondary | ICD-10-CM | POA: Diagnosis not present

## 2021-11-20 DIAGNOSIS — N1831 Chronic kidney disease, stage 3a: Secondary | ICD-10-CM | POA: Diagnosis not present

## 2021-11-22 DIAGNOSIS — N289 Disorder of kidney and ureter, unspecified: Secondary | ICD-10-CM | POA: Diagnosis not present

## 2021-12-01 DIAGNOSIS — E118 Type 2 diabetes mellitus with unspecified complications: Secondary | ICD-10-CM | POA: Diagnosis not present

## 2021-12-01 DIAGNOSIS — K5900 Constipation, unspecified: Secondary | ICD-10-CM | POA: Diagnosis not present

## 2021-12-01 DIAGNOSIS — I10 Essential (primary) hypertension: Secondary | ICD-10-CM | POA: Diagnosis not present

## 2021-12-05 ENCOUNTER — Other Ambulatory Visit: Payer: Self-pay | Admitting: Cardiovascular Disease

## 2021-12-05 DIAGNOSIS — R197 Diarrhea, unspecified: Secondary | ICD-10-CM | POA: Diagnosis not present

## 2021-12-12 ENCOUNTER — Other Ambulatory Visit: Payer: Self-pay | Admitting: Physician Assistant

## 2021-12-12 ENCOUNTER — Ambulatory Visit
Admission: RE | Admit: 2021-12-12 | Discharge: 2021-12-12 | Disposition: A | Discharge status: Home / Self Care | Payer: Medicare PPO | Source: Ambulatory Visit | Attending: Physician Assistant | Admitting: Physician Assistant

## 2021-12-12 DIAGNOSIS — R109 Unspecified abdominal pain: Secondary | ICD-10-CM | POA: Diagnosis not present

## 2021-12-12 DIAGNOSIS — K5900 Constipation, unspecified: Secondary | ICD-10-CM

## 2021-12-13 ENCOUNTER — Other Ambulatory Visit: Payer: Self-pay | Admitting: Physician Assistant

## 2021-12-13 DIAGNOSIS — R109 Unspecified abdominal pain: Secondary | ICD-10-CM | POA: Diagnosis not present

## 2021-12-13 DIAGNOSIS — K5900 Constipation, unspecified: Secondary | ICD-10-CM | POA: Diagnosis not present

## 2021-12-13 DIAGNOSIS — N289 Disorder of kidney and ureter, unspecified: Secondary | ICD-10-CM | POA: Diagnosis not present

## 2021-12-13 DIAGNOSIS — R1013 Epigastric pain: Secondary | ICD-10-CM

## 2021-12-13 DIAGNOSIS — M6208 Separation of muscle (nontraumatic), other site: Secondary | ICD-10-CM | POA: Diagnosis not present

## 2021-12-15 ENCOUNTER — Ambulatory Visit
Admission: RE | Admit: 2021-12-15 | Discharge: 2021-12-15 | Disposition: A | Discharge status: Home / Self Care | Payer: Medicare PPO | Source: Ambulatory Visit | Attending: Physician Assistant | Admitting: Physician Assistant

## 2021-12-15 DIAGNOSIS — R1013 Epigastric pain: Secondary | ICD-10-CM

## 2021-12-15 DIAGNOSIS — R109 Unspecified abdominal pain: Secondary | ICD-10-CM | POA: Diagnosis not present

## 2021-12-18 ENCOUNTER — Other Ambulatory Visit (HOSPITAL_COMMUNITY): Payer: Self-pay | Admitting: Physician Assistant

## 2021-12-18 ENCOUNTER — Other Ambulatory Visit: Payer: Self-pay | Admitting: Physician Assistant

## 2021-12-18 DIAGNOSIS — R1013 Epigastric pain: Secondary | ICD-10-CM

## 2021-12-21 ENCOUNTER — Encounter (HOSPITAL_COMMUNITY)
Admission: RE | Admit: 2021-12-21 | Discharge: 2021-12-21 | Disposition: A | Discharge status: Home / Self Care | Payer: Medicare PPO | Source: Ambulatory Visit | Attending: Physician Assistant | Admitting: Physician Assistant

## 2021-12-21 DIAGNOSIS — R1013 Epigastric pain: Secondary | ICD-10-CM | POA: Insufficient documentation

## 2021-12-21 MED ORDER — TECHNETIUM TC 99M MEBROFENIN IV KIT
5.1000 | PACK | Freq: Once | INTRAVENOUS | Status: AC | PRN
  Administered 2021-12-21: 5.1 via INTRAVENOUS

## 2021-12-29 DIAGNOSIS — E119 Type 2 diabetes mellitus without complications: Secondary | ICD-10-CM | POA: Diagnosis not present

## 2021-12-29 DIAGNOSIS — R1084 Generalized abdominal pain: Secondary | ICD-10-CM | POA: Diagnosis not present

## 2021-12-29 DIAGNOSIS — R634 Abnormal weight loss: Secondary | ICD-10-CM | POA: Diagnosis not present

## 2021-12-29 DIAGNOSIS — K5900 Constipation, unspecified: Secondary | ICD-10-CM | POA: Diagnosis not present

## 2022-01-10 ENCOUNTER — Other Ambulatory Visit: Payer: Self-pay | Admitting: Physician Assistant

## 2022-01-10 DIAGNOSIS — R110 Nausea: Secondary | ICD-10-CM | POA: Diagnosis not present

## 2022-01-10 DIAGNOSIS — R109 Unspecified abdominal pain: Secondary | ICD-10-CM | POA: Diagnosis not present

## 2022-01-10 DIAGNOSIS — N289 Disorder of kidney and ureter, unspecified: Secondary | ICD-10-CM | POA: Diagnosis not present

## 2022-01-10 DIAGNOSIS — R1033 Periumbilical pain: Secondary | ICD-10-CM | POA: Diagnosis not present

## 2022-01-10 DIAGNOSIS — R634 Abnormal weight loss: Secondary | ICD-10-CM | POA: Diagnosis not present

## 2022-01-10 DIAGNOSIS — K5900 Constipation, unspecified: Secondary | ICD-10-CM | POA: Diagnosis not present

## 2022-01-10 DIAGNOSIS — R748 Abnormal levels of other serum enzymes: Secondary | ICD-10-CM | POA: Diagnosis not present

## 2022-01-10 DIAGNOSIS — K219 Gastro-esophageal reflux disease without esophagitis: Secondary | ICD-10-CM | POA: Diagnosis not present

## 2022-01-22 DIAGNOSIS — K5900 Constipation, unspecified: Secondary | ICD-10-CM | POA: Diagnosis not present

## 2022-01-22 DIAGNOSIS — E118 Type 2 diabetes mellitus with unspecified complications: Secondary | ICD-10-CM | POA: Diagnosis not present

## 2022-01-22 DIAGNOSIS — Z23 Encounter for immunization: Secondary | ICD-10-CM | POA: Diagnosis not present

## 2022-01-22 DIAGNOSIS — I10 Essential (primary) hypertension: Secondary | ICD-10-CM | POA: Diagnosis not present

## 2022-01-31 DIAGNOSIS — R82998 Other abnormal findings in urine: Secondary | ICD-10-CM | POA: Diagnosis not present

## 2022-01-31 DIAGNOSIS — E785 Hyperlipidemia, unspecified: Secondary | ICD-10-CM | POA: Diagnosis not present

## 2022-01-31 DIAGNOSIS — Z125 Encounter for screening for malignant neoplasm of prostate: Secondary | ICD-10-CM | POA: Diagnosis not present

## 2022-01-31 DIAGNOSIS — R5383 Other fatigue: Secondary | ICD-10-CM | POA: Diagnosis not present

## 2022-01-31 DIAGNOSIS — R7989 Other specified abnormal findings of blood chemistry: Secondary | ICD-10-CM | POA: Diagnosis not present

## 2022-01-31 DIAGNOSIS — I10 Essential (primary) hypertension: Secondary | ICD-10-CM | POA: Diagnosis not present

## 2022-01-31 DIAGNOSIS — E118 Type 2 diabetes mellitus with unspecified complications: Secondary | ICD-10-CM | POA: Diagnosis not present

## 2022-02-01 DIAGNOSIS — M542 Cervicalgia: Secondary | ICD-10-CM | POA: Diagnosis not present

## 2022-02-01 DIAGNOSIS — L299 Pruritus, unspecified: Secondary | ICD-10-CM | POA: Diagnosis not present

## 2022-02-01 DIAGNOSIS — L718 Other rosacea: Secondary | ICD-10-CM | POA: Diagnosis not present

## 2022-02-01 DIAGNOSIS — Z981 Arthrodesis status: Secondary | ICD-10-CM | POA: Diagnosis not present

## 2022-02-01 DIAGNOSIS — M47814 Spondylosis without myelopathy or radiculopathy, thoracic region: Secondary | ICD-10-CM | POA: Diagnosis not present

## 2022-02-01 DIAGNOSIS — Z85828 Personal history of other malignant neoplasm of skin: Secondary | ICD-10-CM | POA: Diagnosis not present

## 2022-02-01 DIAGNOSIS — R208 Other disturbances of skin sensation: Secondary | ICD-10-CM | POA: Diagnosis not present

## 2022-02-02 ENCOUNTER — Ambulatory Visit
Admission: RE | Admit: 2022-02-02 | Discharge: 2022-02-02 | Disposition: A | Discharge status: Home / Self Care | Payer: Medicare PPO | Source: Ambulatory Visit | Attending: Physician Assistant | Admitting: Physician Assistant

## 2022-02-02 DIAGNOSIS — R634 Abnormal weight loss: Secondary | ICD-10-CM

## 2022-02-02 DIAGNOSIS — R630 Anorexia: Secondary | ICD-10-CM | POA: Diagnosis not present

## 2022-02-02 DIAGNOSIS — K429 Umbilical hernia without obstruction or gangrene: Secondary | ICD-10-CM | POA: Diagnosis not present

## 2022-02-02 DIAGNOSIS — K6389 Other specified diseases of intestine: Secondary | ICD-10-CM | POA: Diagnosis not present

## 2022-02-02 DIAGNOSIS — R109 Unspecified abdominal pain: Secondary | ICD-10-CM

## 2022-02-02 MED ORDER — IOPAMIDOL (ISOVUE-300) INJECTION 61%
100.0000 mL | Freq: Once | INTRAVENOUS | Status: AC | PRN
  Administered 2022-02-02: 100 mL via INTRAVENOUS

## 2022-02-05 DIAGNOSIS — M47816 Spondylosis without myelopathy or radiculopathy, lumbar region: Secondary | ICD-10-CM | POA: Diagnosis not present

## 2022-02-07 DIAGNOSIS — E785 Hyperlipidemia, unspecified: Secondary | ICD-10-CM | POA: Diagnosis not present

## 2022-02-07 DIAGNOSIS — E118 Type 2 diabetes mellitus with unspecified complications: Secondary | ICD-10-CM | POA: Diagnosis not present

## 2022-02-07 DIAGNOSIS — I4891 Unspecified atrial fibrillation: Secondary | ICD-10-CM | POA: Diagnosis not present

## 2022-02-07 DIAGNOSIS — I129 Hypertensive chronic kidney disease with stage 1 through stage 4 chronic kidney disease, or unspecified chronic kidney disease: Secondary | ICD-10-CM | POA: Diagnosis not present

## 2022-02-07 DIAGNOSIS — Z0000 Encounter for general adult medical examination without abnormal findings: Secondary | ICD-10-CM | POA: Diagnosis not present

## 2022-02-07 DIAGNOSIS — D6869 Other thrombophilia: Secondary | ICD-10-CM | POA: Diagnosis not present

## 2022-02-07 DIAGNOSIS — N401 Enlarged prostate with lower urinary tract symptoms: Secondary | ICD-10-CM | POA: Diagnosis not present

## 2022-02-07 DIAGNOSIS — N1831 Chronic kidney disease, stage 3a: Secondary | ICD-10-CM | POA: Diagnosis not present

## 2022-02-07 DIAGNOSIS — R269 Unspecified abnormalities of gait and mobility: Secondary | ICD-10-CM | POA: Diagnosis not present

## 2022-02-12 ENCOUNTER — Other Ambulatory Visit: Payer: Self-pay | Admitting: Thoracic Surgery (Cardiothoracic Vascular Surgery)

## 2022-02-12 DIAGNOSIS — R61 Generalized hyperhidrosis: Secondary | ICD-10-CM | POA: Diagnosis not present

## 2022-02-12 DIAGNOSIS — R030 Elevated blood-pressure reading, without diagnosis of hypertension: Secondary | ICD-10-CM | POA: Diagnosis not present

## 2022-02-12 DIAGNOSIS — I2510 Atherosclerotic heart disease of native coronary artery without angina pectoris: Secondary | ICD-10-CM | POA: Diagnosis not present

## 2022-02-12 DIAGNOSIS — I4891 Unspecified atrial fibrillation: Secondary | ICD-10-CM | POA: Diagnosis not present

## 2022-02-12 DIAGNOSIS — R634 Abnormal weight loss: Secondary | ICD-10-CM | POA: Diagnosis not present

## 2022-02-12 DIAGNOSIS — R748 Abnormal levels of other serum enzymes: Secondary | ICD-10-CM | POA: Diagnosis not present

## 2022-02-12 DIAGNOSIS — E119 Type 2 diabetes mellitus without complications: Secondary | ICD-10-CM | POA: Diagnosis not present

## 2022-02-12 DIAGNOSIS — I712 Thoracic aortic aneurysm, without rupture, unspecified: Secondary | ICD-10-CM

## 2022-02-15 DIAGNOSIS — R1033 Periumbilical pain: Secondary | ICD-10-CM | POA: Diagnosis not present

## 2022-02-15 DIAGNOSIS — K5900 Constipation, unspecified: Secondary | ICD-10-CM | POA: Diagnosis not present

## 2022-02-15 DIAGNOSIS — K219 Gastro-esophageal reflux disease without esophagitis: Secondary | ICD-10-CM | POA: Diagnosis not present

## 2022-02-16 DIAGNOSIS — Z0100 Encounter for examination of eyes and vision without abnormal findings: Secondary | ICD-10-CM | POA: Diagnosis not present

## 2022-02-26 ENCOUNTER — Ambulatory Visit: Payer: Medicare PPO | Attending: Cardiovascular Disease

## 2022-02-26 DIAGNOSIS — I7121 Aneurysm of the ascending aorta, without rupture: Secondary | ICD-10-CM | POA: Diagnosis not present

## 2022-02-27 LAB — BASIC METABOLIC PANEL
BUN/Creatinine Ratio: 26 — ABNORMAL HIGH (ref 10–24)
BUN: 26 mg/dL (ref 8–27)
CO2: 28 mmol/L (ref 20–29)
Calcium: 9.3 mg/dL (ref 8.6–10.2)
Chloride: 105 mmol/L (ref 96–106)
Creatinine, Ser: 1 mg/dL (ref 0.76–1.27)
Glucose: 115 mg/dL — ABNORMAL HIGH (ref 70–99)
Potassium: 3.5 mmol/L (ref 3.5–5.2)
Sodium: 145 mmol/L — ABNORMAL HIGH (ref 134–144)
eGFR: 77 mL/min/{1.73_m2} (ref >59)

## 2022-03-09 ENCOUNTER — Inpatient Hospital Stay: Admission: RE | Admit: 2022-03-09 | Payer: Medicare PPO | Source: Ambulatory Visit

## 2022-03-14 DIAGNOSIS — I10 Essential (primary) hypertension: Secondary | ICD-10-CM | POA: Diagnosis not present

## 2022-03-14 DIAGNOSIS — K319 Disease of stomach and duodenum, unspecified: Secondary | ICD-10-CM | POA: Diagnosis not present

## 2022-03-14 DIAGNOSIS — R634 Abnormal weight loss: Secondary | ICD-10-CM | POA: Diagnosis not present

## 2022-03-14 DIAGNOSIS — K469 Unspecified abdominal hernia without obstruction or gangrene: Secondary | ICD-10-CM | POA: Diagnosis not present

## 2022-03-14 DIAGNOSIS — K5900 Constipation, unspecified: Secondary | ICD-10-CM | POA: Diagnosis not present

## 2022-03-14 DIAGNOSIS — E118 Type 2 diabetes mellitus with unspecified complications: Secondary | ICD-10-CM | POA: Diagnosis not present

## 2022-03-14 DIAGNOSIS — K219 Gastro-esophageal reflux disease without esophagitis: Secondary | ICD-10-CM | POA: Diagnosis not present

## 2022-03-14 DIAGNOSIS — R1310 Dysphagia, unspecified: Secondary | ICD-10-CM | POA: Diagnosis not present

## 2022-03-15 DIAGNOSIS — R1319 Other dysphagia: Secondary | ICD-10-CM | POA: Diagnosis not present

## 2022-03-15 DIAGNOSIS — R109 Unspecified abdominal pain: Secondary | ICD-10-CM | POA: Diagnosis not present

## 2022-03-15 DIAGNOSIS — K5901 Slow transit constipation: Secondary | ICD-10-CM | POA: Diagnosis not present

## 2022-03-15 DIAGNOSIS — R634 Abnormal weight loss: Secondary | ICD-10-CM | POA: Diagnosis not present

## 2022-03-20 ENCOUNTER — Encounter: Payer: Self-pay | Admitting: Thoracic Surgery (Cardiothoracic Vascular Surgery)

## 2022-03-26 ENCOUNTER — Ambulatory Visit: Payer: Medicare PPO | Admitting: Thoracic Surgery (Cardiothoracic Vascular Surgery)

## 2022-03-26 ENCOUNTER — Ambulatory Visit
Admission: RE | Admit: 2022-03-26 | Discharge: 2022-03-26 | Disposition: A | Discharge status: Home / Self Care | Payer: Medicare PPO | Source: Ambulatory Visit | Attending: Thoracic Surgery (Cardiothoracic Vascular Surgery) | Admitting: Thoracic Surgery (Cardiothoracic Vascular Surgery)

## 2022-03-26 VITALS — BP 124/75 | HR 65 | Resp 20 | Ht 79.0 in | Wt 225.0 lb

## 2022-03-26 DIAGNOSIS — I700 Atherosclerosis of aorta: Secondary | ICD-10-CM | POA: Diagnosis not present

## 2022-03-26 DIAGNOSIS — I7121 Aneurysm of the ascending aorta, without rupture: Secondary | ICD-10-CM | POA: Diagnosis not present

## 2022-03-26 DIAGNOSIS — J439 Emphysema, unspecified: Secondary | ICD-10-CM | POA: Diagnosis not present

## 2022-03-26 DIAGNOSIS — R911 Solitary pulmonary nodule: Secondary | ICD-10-CM | POA: Diagnosis not present

## 2022-03-26 DIAGNOSIS — R918 Other nonspecific abnormal finding of lung field: Secondary | ICD-10-CM | POA: Diagnosis not present

## 2022-03-26 DIAGNOSIS — I712 Thoracic aortic aneurysm, without rupture, unspecified: Secondary | ICD-10-CM

## 2022-03-26 NOTE — Progress Notes (Signed)
[address]       [place] [address]             [phone]           Ronald [name] Medical Record #[id number] Date of Birth: January 11, 1941  Ronald Leader, MD  Chief Complaint:   known ascending aortic aneurysm follow up  History of Present Illness:     Pt is a 81 yo male who has known ascending aortic aneurysm who has been followed routinely with serial CT scans. Pt presents today and on recent CT scan has had no significant change in diameter at 5cm. Pt has no complaints regarding CP over the past year. Pt however has had a significant weight loss, chronic constipation/diarrhea, abdominal pain worsened with fried foods and fish and when constipated and has had a work up via the gastroenterology division without diagnosing etiology or given relief from symptoms. Pt told me he was just released from [facility] having been told "to get his affairs in order because its his heart failure as the cause" Pt and wife appear very distraught by this information. He reports now night sweats and a burning feeling in his belly. He has seen cardiology this past May and does have chronic afib (was in NSR last visit) and history of CAD but had normal LV function on echo last year. Dr Julianne Handler was not concerned about heart issues at last visit      Past Medical History:  Diagnosis Date   Arthritis    Coronary artery disease    Stent 2004   GERD (gastroesophageal reflux disease)    History of echocardiogram    Echo 5/17: mild LVH, EF 50-55%, no RWMA, Gr 2 DD, mild to mod AI, PASP 32 mmHg   History of nuclear stress test    Myoview 5/17 - EF 48%, no scar, no ischemia. 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    Lung nodules    noted on past CT scans   Malaria    SVT (supraventricular tachycardia) (HCC)    remote episode during exercise test   Thoracic aortic aneurysm [facility])    last scan in Jan 2013. Now measuring 5.0cm. Referred to Dr. Servando Snare   Wears dentures    bottom   Wears glasses     Past Surgical History:  Procedure Laterality Date   ATRIAL FIBRILLATION ABLATION N/A 05/12/2019   Procedure: ATRIAL FIBRILLATION ABLATION;  Surgeon: Thompson Grayer, MD;  Location: [facility];  Service: Cardiovascular;  Laterality: N/A;   CARDIOVERSION N/A 03/13/2019   Procedure: CARDIOVERSION;  Surgeon: Fay Records, MD;  Location: [facility];  Service: Cardiovascular;  Laterality: N/A;   CARDIOVERSION N/A 04/15/2019   Procedure: CARDIOVERSION;  Surgeon: Thayer Headings, MD;  Location: [facility];  Service: Cardiovascular;  Laterality: N/A;   CARDIOVERSION N/A 06/16/2019   Procedure: CARDIOVERSION;  Surgeon: Skeet Latch, MD;  Location: [facility];  Service: Cardiovascular;  Laterality: N/A;   CERVICAL FUSION  03-2010   c2-c7   COLONOSCOPY     x4   CORONARY STENT PLACEMENT  2004   dental implant     x 2   ELBOW SURGERY  1997   lt and rt   EYE SURGERY     HIP PINNING,CANNULATED Right 05/11/2014   Procedure: CANNULATED HIP PINNING;  Surgeon: Renette Butters, MD;  Location: [facility];  Service: Orthopedics;  Laterality: Right;   RETINAL DETACHMENT SURGERY  1975   lt   rotator cuff surgery  1997   lt   SHOULDER ARTHROSCOPY  2/15   right-got cardiac clearance-[facility]   TONSILLECTOMY     TRIGGER FINGER RELEASE Left 10/12/2013   Procedure: LEFT LONG FINGER TRIGGER RELEASE;  Surgeon: Jolyn Nap, MD;  Location: [facility];  Service: Orthopedics;  Laterality: Left;    Social History   Tobacco Use  Smoking Status Former  Smokeless Tobacco Current   Types: Chew  Tobacco Comments   reports that when he smoked it was intermittent    Social History    Substance and Sexual Activity  Alcohol Use Yes   Alcohol/week: 1.0 standard drink of alcohol   Types: 1 Standard drinks or equivalent per week   Comment: occasional mainly during holiday    Social History   Socioeconomic History   Marital status: Married    Spouse name: Not on file   Number of children: 1   Years of education: Not on file   Highest education level: Not on file  Occupational History   Occupation: retired Games developer: RETIRED  Tobacco Use   Smoking status: Former   Smokeless tobacco: Current    Types: Chew   Tobacco comments:    reports that when he smoked it was intermittent  Vaping Use   Vaping Use: Unknown  Substance and Sexual Activity   Alcohol use: Yes    Alcohol/week: 1.0 standard drink of alcohol    Types: 1 Standard drinks or equivalent per week    Comment: occasional mainly during holiday   Drug use: No   Sexual activity: Yes  Other Topics Concern   Not on file  Social History Narrative   Lives in [place]   Retired Patent attorney (Nurse, children's at [employer], [employer])   Social Determinants of Health   Financial Resource Strain: Not on file  Food Insecurity: Not on file  Transportation Needs: Not on file  Physical Activity: Not on file  Stress: Not on file  Social Connections: Not on file  Intimate Partner Violence: Not on file    Allergies  Allergen Reactions   Quinolones Hypertension    Patient was warned about not using Cipro and similar antibiotics. Recent studies have raised concern that fluoroquinolone antibiotics could be associated with an increased risk of aortic aneurysm Fluoroquinolones have non-antimicrobial properties that might jeopardise the integrity of the extracellular matrix of the vascular wall In a  propensity score matched cohort study in [place], there was a 66% increased rate of aortic aneurysm or dissection associated with oral fluoroquinolone use, compared wit    Promethazine Hcl Other (See Comments)    Agitation and incoherence (phenergan)   Vesicare [Solifenacin Succinate] Itching and Rash   Atorvastatin     Other reaction(s): Myalgia   Ezetimibe-Simvastatin     Other reaction(s): Joints    Current Outpatient Medications  Medication Sig Dispense Refill   amiodarone (PACERONE) 200 MG tablet TAKE 1 TABLET BY MOUTH TWICE A DAY 180 tablet 3   apixaban (ELIQUIS) 5 MG TABS tablet TAKE 1 TABLET  BY MOUTH TWICE A DAY 60 tablet 5   blood glucose meter kit and supplies KIT Dispense based on patient and insurance preference. Use up to four times daily as directed. 1 each 0   chlorthalidone (HYGROTON) 25 MG tablet Take 12.5 mg by mouth every morning.     clobetasol ointment (TEMOVATE) 8.67 % Apply 1 application topically 2 (two) times daily as needed (itchy scaly on legs).      Coenzyme Q10 (COQ-10) 200 MG CAPS Take 200 mg by mouth every morning.     ezetimibe (ZETIA) 10 MG tablet Take 10 mg by mouth every evening.     fluticasone (FLONASE) 50 MCG/ACT nasal spray Place 1 spray into both nostrils daily as needed for allergies (sinus congestion).     Glucosamine HCl 1500 MG TABS Take 3,000 mg by mouth daily.     hydrocortisone 2.5 % cream Apply 1 application topically as needed (eye brows flakey). Use on head     Ivermectin (SOOLANTRA EX) Apply 1 application topically daily as needed (Rosacea).      ketoconazole (NIZORAL) 2 % cream Apply 1 application topically daily as needed for irritation (Corner of mouth (sores)).     lisinopril (PRINIVIL,ZESTRIL) 40 MG tablet Take 10 mg by mouth every evening. Take 1/4 tablet (10 mg) daily     LIVALO 4 MG TABS Take 2 mg by mouth at bedtime.      metFORMIN (GLUCOPHAGE) 1000 MG tablet Take 1 tablet (1,000 mg total) by mouth 2 (two) times daily. 30 tablet 0   Multiple Vitamin (MULTIVITAMIN WITH MINERALS) TABS tablet Take 1 tablet by mouth daily.      mupirocin ointment (BACTROBAN) 2 % Apply 1 application topically daily as  needed (tick bites).     nitroGLYCERIN (NITROSTAT) 0.4 MG SL tablet Place 1 tablet (0.4 mg total) under the tongue every 5 (five) minutes as needed for chest pain. 25 tablet 1   No current facility-administered medications for this visit.     Family History  Problem Relation Age of Onset   Heart disease Father    Heart attack Father        59   Cancer Mother 52       LUNG   Hypertension Neg Hx    Stroke Neg Hx        Physical Exam: Tall Lungs; clear Card: rr with extrasystole, no murmur Ext: no edema Abd: soft nontender     Diagnostic Studies & Laboratory data: I have personally reviewed the following studies and agree with the findings     Recent Radiology Findings:   CT of chest today   FINDINGS: Cardiovascular: Ascending thoracic aorta measures up to 4.5 by 5.0 cm, similar in comparison to prior. Relative preservation of the sino-tubular junction. Aortic valve calcifications. Tortuous descending thoracic aorta measuring up to 4 by 4.1 cm chest distal to the aortic arch. No pericardial effusion. Three-vessel coronary artery atherosclerotic calcifications. Scattered atherosclerotic calcifications.  Mediastinum/Nodes: No axillary or mediastinal adenopathy. Visualized thyroid is unremarkable.  Lungs/Pleura: No pleural effusion or pneumothorax. Mild centrilobular emphysema. Evaluation of the bases is limited secondary to motion.  Revisualization of scattered pulmonary nodules with representative nodules as follows:  Nodule 1: RIGHT lower lobe pulmonary nodule measures 5 mm, stable dating back to 2020 (series 6, image 78).  Nodule 2: RIGHT lower lobe pulmonary nodule measures 3 mm, stable dating back to 2020 (series 6, image 166).  Nodule 3: LEFT apical pulmonary nodule measures 3 mm, similar dating  back to 2020 (series 6, image 61).  Upper Abdomen: No acute abnormality.  Musculoskeletal: Status post ACDF.  IMPRESSION: 1. Similar aneurysmal dilatation  of the ascending thoracic aorta measuring up to 5.0 cm. Recommend semi-annual imaging followup by CTA or MRA and referral to cardiothoracic surgery if not already obtained. This recommendation follows 2010 ACCF/AHA/AATS/ACR/ASA/SCA/SCAI/SIR/STS/SVM Guidelines for the Diagnosis and Management of Patients With Thoracic Aortic Disease. Circulation. 2010; 121: S923-R007. Aortic aneurysm NOS (ICD10-I71.9) 2. Aortic valve calcifications can be seen in the setting of aortic stenosis. Recommend correlation with echocardiography if not previously performed. 3. Scattered pulmonary nodules measuring up to 5 mm are stable dating back to 2020. Given underlying emphysema, recommend evaluation for candidacy for annual lung cancer screening CT.   Recent Lab Findings: Lab Results  Component Value Date   WBC 6.1 10/31/2021   HGB 15.3 10/31/2021   HCT 45.3 10/31/2021   PLT 204 10/31/2021   GLUCOSE 115 (H) 02/26/2022   CHOL 184 11/14/2010   TRIG 143.0 11/14/2010   HDL 48.60 11/14/2010   LDLCALC 107 (H) 11/14/2010   ALT 41 10/31/2021   AST 31 10/31/2021   NA 145 (H) 02/26/2022   K 3.5 02/26/2022   CL 105 02/26/2022   CREATININE 1.00 02/26/2022   BUN 26 02/26/2022   CO2 28 02/26/2022   TSH 3.340 03/20/2021   INR 1.0 06/06/2011      Assessment / Plan:     Asymptomatic stable ascending aortic aneurysm. Pt has no indication to undergo elective replacement and all the reasons for that were discussed and will get noncontrast CT in a year as serial follow up. Abd Pain/ weight loss/ sweats: I listened and unfortunately not able to shed further light on ongoing work up other than I see nothing to point to his heart as the cause of the above. Perhaps ID to see with regards to new night sweats. I have encouraged him to have his PCP push to have his GI service finding the etiology which it seems clear to have a source in his abdomen or to come up with new plan on determining and resolving these  issues   I have spent 60 min in review of the records, viewing studies and in face to face with patient and in coordination of future care    Ronald [name] 03/26/2022 11:37 AM

## 2022-03-26 NOTE — Patient Instructions (Signed)
Non contrast ct in a year

## 2022-03-28 ENCOUNTER — Telehealth: Payer: Self-pay | Admitting: Cardiovascular Disease

## 2022-03-28 NOTE — Telephone Encounter (Signed)
Pt is requesting call back in regards to a referral he would like set up with [facility] and needs advice as to whom to schedule with. Please advise.

## 2022-03-28 NOTE — Telephone Encounter (Signed)
Returned call to patient who states that Dr Leafy Ro told him two days ago that he should go to his PCP office and ask for a referral to [facility]. No mention of [facility] specifically in the chart. Pt states he's called Perini's office three times and just got a call back about 5 minutes prior to our conversation stating Perini was looking over his chart and will call him back. Pt very scattered with thoughts. Denies need to see [facility]. Repeatedly discussing GI issues and need to see GI doctor. Advised him to call our office back if he felt he should be seen by cardiology. No further questions.

## 2022-04-15 ENCOUNTER — Other Ambulatory Visit: Payer: Self-pay

## 2022-04-15 DIAGNOSIS — Z7901 Long term (current) use of anticoagulants: Secondary | ICD-10-CM | POA: Insufficient documentation

## 2022-04-15 DIAGNOSIS — I10 Essential (primary) hypertension: Secondary | ICD-10-CM | POA: Diagnosis not present

## 2022-04-15 DIAGNOSIS — I2510 Atherosclerotic heart disease of native coronary artery without angina pectoris: Secondary | ICD-10-CM | POA: Diagnosis not present

## 2022-04-15 DIAGNOSIS — M21371 Foot drop, right foot: Secondary | ICD-10-CM | POA: Insufficient documentation

## 2022-04-15 DIAGNOSIS — E876 Hypokalemia: Secondary | ICD-10-CM | POA: Diagnosis not present

## 2022-04-15 DIAGNOSIS — R200 Anesthesia of skin: Secondary | ICD-10-CM | POA: Diagnosis present

## 2022-04-15 DIAGNOSIS — Z79899 Other long term (current) drug therapy: Secondary | ICD-10-CM | POA: Diagnosis not present

## 2022-04-15 DIAGNOSIS — M4316 Spondylolisthesis, lumbar region: Secondary | ICD-10-CM | POA: Diagnosis not present

## 2022-04-15 NOTE — ED Triage Notes (Signed)
Patient arrives to ED c/o leg pain. Pt states that he has been traveling all day and has had to walk long distances in multiple airports. Pt stated "my foot was just flopping" No other complaints at this time.

## 2022-04-16 ENCOUNTER — Emergency Department (HOSPITAL_BASED_OUTPATIENT_CLINIC_OR_DEPARTMENT_OTHER): Payer: Medicare PPO

## 2022-04-16 ENCOUNTER — Emergency Department (HOSPITAL_BASED_OUTPATIENT_CLINIC_OR_DEPARTMENT_OTHER): Payer: Medicare PPO | Admitting: Radiology

## 2022-04-16 ENCOUNTER — Emergency Department (HOSPITAL_COMMUNITY): Payer: Medicare PPO

## 2022-04-16 ENCOUNTER — Emergency Department (HOSPITAL_BASED_OUTPATIENT_CLINIC_OR_DEPARTMENT_OTHER)
Admission: EM | Admit: 2022-04-16 | Discharge: 2022-04-16 | Disposition: A | Discharge status: Home / Self Care | Payer: Medicare PPO | Attending: Emergency Medicine | Admitting: Emergency Medicine

## 2022-04-16 DIAGNOSIS — M21371 Foot drop, right foot: Secondary | ICD-10-CM | POA: Diagnosis not present

## 2022-04-16 DIAGNOSIS — R200 Anesthesia of skin: Secondary | ICD-10-CM | POA: Diagnosis present

## 2022-04-16 DIAGNOSIS — R519 Headache, unspecified: Secondary | ICD-10-CM | POA: Diagnosis not present

## 2022-04-16 DIAGNOSIS — E876 Hypokalemia: Secondary | ICD-10-CM | POA: Diagnosis not present

## 2022-04-16 DIAGNOSIS — Z7901 Long term (current) use of anticoagulants: Secondary | ICD-10-CM | POA: Diagnosis not present

## 2022-04-16 DIAGNOSIS — Z79899 Other long term (current) drug therapy: Secondary | ICD-10-CM | POA: Diagnosis not present

## 2022-04-16 DIAGNOSIS — I10 Essential (primary) hypertension: Secondary | ICD-10-CM | POA: Diagnosis not present

## 2022-04-16 DIAGNOSIS — M4316 Spondylolisthesis, lumbar region: Secondary | ICD-10-CM | POA: Diagnosis not present

## 2022-04-16 DIAGNOSIS — I2510 Atherosclerotic heart disease of native coronary artery without angina pectoris: Secondary | ICD-10-CM | POA: Diagnosis not present

## 2022-04-16 LAB — COMPREHENSIVE METABOLIC PANEL
ALT: 44 U/L (ref 0–44)
AST: 28 U/L (ref 15–41)
Albumin: 3.9 g/dL (ref 3.5–5.0)
Alkaline Phosphatase: 85 U/L (ref 38–126)
Anion gap: 6 (ref 5–15)
BUN: 20 mg/dL (ref 8–23)
CO2: 32 mmol/L (ref 22–32)
Calcium: 8.9 mg/dL (ref 8.9–10.3)
Chloride: 103 mmol/L (ref 98–111)
Creatinine, Ser: 1.09 mg/dL (ref 0.61–1.24)
GFR, Estimated: >60 mL/min (ref >60)
Glucose, Bld: 111 mg/dL — ABNORMAL HIGH (ref 70–99)
Potassium: 2.9 mmol/L — ABNORMAL LOW (ref 3.5–5.1)
Sodium: 141 mmol/L (ref 135–145)
Total Bilirubin: 0.5 mg/dL (ref 0.3–1.2)
Total Protein: 6.4 g/dL — ABNORMAL LOW (ref 6.5–8.1)

## 2022-04-16 LAB — PROTIME-INR
INR: 1.1 (ref 0.8–1.2)
Prothrombin Time: 14.1 seconds (ref 11.4–15.2)

## 2022-04-16 LAB — DIFFERENTIAL
Abs Immature Granulocytes: 0.02 10*3/uL (ref 0.00–0.07)
Basophils Absolute: 0 10*3/uL (ref 0.0–0.1)
Basophils Relative: 1 %
Eosinophils Absolute: 0.1 10*3/uL (ref 0.0–0.5)
Eosinophils Relative: 1 %
Immature Granulocytes: 0 %
Lymphocytes Relative: 29 %
Lymphs Abs: 1.7 10*3/uL (ref 0.7–4.0)
Monocytes Absolute: 0.9 10*3/uL (ref 0.1–1.0)
Monocytes Relative: 15 %
Neutro Abs: 3.1 10*3/uL (ref 1.7–7.7)
Neutrophils Relative %: 54 %

## 2022-04-16 LAB — RAPID URINE DRUG SCREEN, HOSP PERFORMED
Amphetamines: NOT DETECTED
Barbiturates: NOT DETECTED
Benzodiazepines: NOT DETECTED
Cocaine: NOT DETECTED
Opiates: NOT DETECTED
Tetrahydrocannabinol: NOT DETECTED

## 2022-04-16 LAB — URINALYSIS, ROUTINE W REFLEX MICROSCOPIC
Bilirubin Urine: NEGATIVE
Glucose, UA: NEGATIVE mg/dL
Hgb urine dipstick: NEGATIVE
Ketones, ur: NEGATIVE mg/dL
Leukocytes,Ua: NEGATIVE
Nitrite: NEGATIVE
Protein, ur: NEGATIVE mg/dL
Specific Gravity, Urine: 1.02 (ref 1.005–1.030)
pH: 5.5 (ref 5.0–8.0)

## 2022-04-16 LAB — CBC
HCT: 39.7 % (ref 39.0–52.0)
Hemoglobin: 13.1 g/dL (ref 13.0–17.0)
MCH: 30.6 pg (ref 26.0–34.0)
MCHC: 33 g/dL (ref 30.0–36.0)
MCV: 92.8 fL (ref 80.0–100.0)
Platelets: 207 10*3/uL (ref 150–400)
RBC: 4.28 MIL/uL (ref 4.22–5.81)
RDW: 14.2 % (ref 11.5–15.5)
WBC: 5.8 10*3/uL (ref 4.0–10.5)
nRBC: 0 % (ref 0.0–0.2)

## 2022-04-16 LAB — ETHANOL: Alcohol, Ethyl (B): <10 mg/dL (ref <10)

## 2022-04-16 LAB — APTT: aPTT: 30 seconds (ref 24–36)

## 2022-04-16 MED ORDER — POTASSIUM CHLORIDE CRYS ER 20 MEQ PO TBCR
40.0000 meq | EXTENDED_RELEASE_TABLET | Freq: Once | ORAL | Status: AC
  Administered 2022-04-16: 40 meq via ORAL
  Filled 2022-04-16: qty 2

## 2022-04-16 MED ORDER — POTASSIUM CHLORIDE 10 MEQ/100ML IV SOLN
10.0000 meq | Freq: Once | INTRAVENOUS | Status: AC
  Administered 2022-04-16: 10 meq via INTRAVENOUS
  Filled 2022-04-16: qty 100

## 2022-04-16 NOTE — ED Provider Notes (Signed)
[facility] EMERGENCY DEPT Provider Note   CSN: [id number] Arrival date & time: 04/15/22  2051     History  Chief Complaint  Patient presents with   Leg Pain    Ronald [name] is a 82 y.o. male.  HPI     This is a 82 year old male with a history of hypertension, coronary artery disease who presents with right foot numbness and weakness.  Patient reports that he was traveling yesterday through the airport.  He states that he walks several miles.  He noted that his right foot was "flopping on the ground."  He also reports that "it feels like it may be numb.  He describes numbness throughout the whole foot.  He states he has had some numbness in the past but this is stated.  EMS was called at the [place] airport but patient declined transport.  He denies any weakness, numbness, tingling of the upper extremities.  No speech difficulties.  He reports recent history of unintentional weight loss up to 70 pounds.  He has had some ongoing abdominal discomfort for which she has had multiple imaging studies.  He also reports some ongoing back discomfort.  Denies bowel or bladder difficulties.    Home Medications Prior to Admission medications   Medication Sig Start Date End Date Taking? Authorizing Provider  amiodarone (PACERONE) 200 MG tablet TAKE 1 TABLET BY MOUTH TWICE A DAY 12/05/21   Kathleene Hazel, MD  apixaban (ELIQUIS) 5 MG TABS tablet TAKE 1 TABLET BY MOUTH TWICE A DAY 09/22/21   Kathleene Hazel, MD  blood glucose meter kit and supplies KIT Dispense based on patient and insurance preference. Use up to four times daily as directed. 10/31/21   Cheryll Cockayne, MD  chlorthalidone (HYGROTON) 25 MG tablet Take 12.5 mg by mouth every morning. 09/05/21   [provider]  clobetasol ointment (TEMOVATE) 0.05 % Apply 1 application topically 2 (two) times daily as needed (itchy scaly on legs).     [provider]  Coenzyme Q10 (COQ-10) 200 MG CAPS  Take 200 mg by mouth every morning.    [provider]  dicyclomine (BENTYL) 10 MG capsule 1 capsule 30 minutes before eating Orally Three times a day as needed 12/04/21   [provider]  ezetimibe (ZETIA) 10 MG tablet Take 10 mg by mouth every evening.    [provider]  famotidine (PEPCID) 40 MG tablet Take 40 mg by mouth 2 (two) times daily. 03/07/22   [provider]  fluticasone (FLONASE) 50 MCG/ACT nasal spray Place 1 spray into both nostrils daily as needed for allergies (sinus congestion).    [provider]  gabapentin (NEURONTIN) 300 MG capsule 1 capsule Oral Once a day in evening    [provider]  Glucosamine HCl 1500 MG TABS Take 3,000 mg by mouth daily.    [provider]  hydrocortisone 2.5 % cream Apply 1 application topically as needed (eye brows flakey). Use on head 07/09/14   [provider]  Ivermectin (SOOLANTRA EX) Apply 1 application topically daily as needed (Rosacea).     [provider]  ketoconazole (NIZORAL) 2 % cream Apply 1 application topically daily as needed for irritation (Corner of mouth (sores)).    [provider]  LINZESS 145 MCG CAPS capsule Take 145 mcg by mouth daily.    [provider]  lisinopril (PRINIVIL,ZESTRIL) 40 MG tablet Take 10 mg by mouth every evening. Take 1/4 tablet (10 mg) daily  [provider]  LIVALO 4 MG TABS Take 2 mg by mouth at bedtime.  02/24/19   [provider]  Multiple Vitamin (MULTIVITAMIN WITH MINERALS) TABS tablet Take 1 tablet by mouth daily.     [provider]  mupirocin ointment (BACTROBAN) 2 % Apply 1 application topically daily as needed (tick bites).    [provider]  nitroGLYCERIN (NITROSTAT) 0.4 MG SL tablet Place 1 tablet (0.4 mg total) under the tongue every 5 (five) minutes as needed for chest pain. 04/07/19   Kathleene Hazel, MD  sucralfate (CARAFATE) 1 g tablet Take 1 g by  mouth 3 (three) times daily.    [provider]      Allergies    Quinolones, Promethazine hcl, Vesicare [solifenacin succinate], Atorvastatin, and Ezetimibe-simvastatin    Review of Systems   Review of Systems  Constitutional:  Negative for fever.  Respiratory:  Negative for shortness of breath.   Cardiovascular:  Negative for chest pain.  Musculoskeletal:  Positive for back pain.  Neurological:  Positive for weakness and numbness.  All other systems reviewed and are negative.   Physical Exam Updated Vital Signs BP (!) 154/84   Pulse (!) 50   Temp 97.7 F (36.5 C)   Resp 18   Ht 2.007 m (6\' 7" )   Wt 98.9 kg   SpO2 98%   BMI 24.56 kg/m  Physical Exam Vitals and nursing note reviewed.  Constitutional:      Appearance: He is well-developed. He is not ill-appearing.  HENT:     Head: Normocephalic and atraumatic.  Eyes:     Pupils: Pupils are equal, round, and reactive to light.  Cardiovascular:     Rate and Rhythm: Normal rate and regular rhythm.     Heart sounds: Normal heart sounds. No murmur heard. Pulmonary:     Effort: Pulmonary effort is normal. No respiratory distress.     Breath sounds: Normal breath sounds. No wheezing.  Abdominal:     General: Bowel sounds are normal.     Palpations: Abdomen is soft.     Tenderness: There is no abdominal tenderness. There is no rebound.  Musculoskeletal:     Cervical back: Neck supple.     Comments: Tenderness palpation lower lumbar spine, no step-off or deformity noted  Lymphadenopathy:     Cervical: No cervical adenopathy.  Skin:    General: Skin is warm and dry.  Neurological:     Mental Status: He is alert and oriented to person, place, and time.     Comments: 5 out of 5 strength bilateral upper extremities, no dysmetria to finger-nose-finger, cranial nerves II through XII intact, there is slight weakness with dorsiflexion of the right foot 4 out of 5, no drift noted and proximal muscle strength appears  intact  Psychiatric:        Mood and Affect: Mood normal.     ED Results / Procedures / Treatments   Labs (all labs ordered are listed, but only abnormal results are displayed) Labs Reviewed  COMPREHENSIVE METABOLIC PANEL - Abnormal; Notable for the following components:      Result Value   Potassium 2.9 (*)    Glucose, Bld 111 (*)    Total Protein 6.4 (*)    All other components within normal limits  ETHANOL  PROTIME-INR  APTT  CBC  DIFFERENTIAL  RAPID URINE DRUG SCREEN, HOSP PERFORMED  URINALYSIS, ROUTINE W REFLEX MICROSCOPIC    EKG EKG Interpretation  Date/Time:  Monday  April 16 2022 01:48:20 EST Ventricular Rate:  49 PR Interval:  223 QRS Duration: 163 QT Interval:  528 QTC Calculation: 477 R Axis:   -35 Text Interpretation: Sinus bradycardia Right bundle branch block No significant change since last tracing Confirmed by Ross Marcus ([id number]) on 04/16/2022 2:27:17 AM  Radiology DG Lumbar Spine Complete  Result Date: 04/16/2022 CLINICAL DATA:  Right foot drop. EXAM: LUMBAR SPINE - COMPLETE 4+ VIEW COMPARISON:  None Available. FINDINGS: There is no evidence of acute lumbar spine fracture. Alignment is normal. Multilevel intervertebral disc space narrowing degenerative endplate changes and facet arthropathy. IMPRESSION: 1. No acute fracture. 2. Multilevel degenerative changes. Electronically Signed   By: Thornell Sartorius M.D.   On: 04/16/2022 01:40   CT HEAD WO CONTRAST  Result Date: 04/16/2022 CLINICAL DATA:  Neuro deficit, acute, stroke suspected.  Leg pain. EXAM: CT HEAD WITHOUT CONTRAST TECHNIQUE: Contiguous axial images were obtained from the base of the skull through the vertex without intravenous contrast. RADIATION DOSE REDUCTION: This exam was performed according to the departmental dose-optimization program which includes automated exposure control, adjustment of the mA and/or kV according to patient size and/or use of iterative reconstruction technique.  COMPARISON:  02/27/2018. FINDINGS: Brain: No acute intracranial hemorrhage, midline shift or mass effect. No extra-axial fluid collection. Mild periventricular white matter hypodensities are noted bilaterally. No hydrocephalus. Vascular: No hyperdense vessel or unexpected calcification. Skull: Normal. Negative for fracture or focal lesion. Sinuses/Orbits: Mucosal thickening is present in the maxillary sinuses bilaterally. No acute orbital abnormality. Other: None. IMPRESSION: 1. No acute intracranial process. 2. Mild chronic microvascular ischemic changes. Electronically Signed   By: Thornell Sartorius M.D.   On: 04/16/2022 01:25    Procedures Procedures    Medications Ordered in ED Medications  potassium chloride 10 mEq in 100 mL IVPB (has no administration in time range)  potassium chloride SA (KLOR-CON M) CR tablet 40 mEq (40 mEq Oral Given 04/16/22 0245)    ED Course/ Medical Decision Making/ A&P                           Medical Decision Making Amount and/or Complexity of Data Reviewed Labs: ordered. Radiology: ordered.  Risk Prescription drug management.   This patient presents to the ED for concern of right foot drop, this involves an extensive number of treatment options, and is a complaint that carries with it a high risk of complications and morbidity.  I considered the following differential and admission for this acute, potentially life threatening condition.  The differential diagnosis includes lumbar lesion including fracture or mass lesion, CVA, isolated peripheral nerve palsy  MDM:    This is a 82 year old male who presents with concerns for his right foot flopping.  He is overall nontoxic and vital signs are reassuring.  He does have evidence of some mild right foot drop on the right with weak dorsiflexion.  This is an isolated exam finding.  He reports significant weight loss and ongoing back discomfort.  Considerations include lumbar lesion and/or CVA.  Given recent weight  loss, would also be concern for potential B symptoms and undiagnosed cancer.  I have reviewed recent CT imaging as of fall.  No noted lesions in the chest abdomen pelvis or lumbar spine.  Workup initiated.  CT head negative.  Basic lab workup is largely reassuring with the exception of mild hypokalemia.  This was replaced oral and IV.  Given patient's symptoms, feel it is prudent that  he get MRI imaging of the lumbar spine and head.  He is agreeable to this plan.  Will send to [facility] for MRI imaging.  Dr. Roxanne Mins accepting.  (Labs, imaging, consults)  Labs: I Ordered, and personally interpreted labs.  The pertinent results include: CBC, BMP, UDS, UA, PT/INR  Imaging Studies ordered: I ordered imaging studies including CT head, lumbar plain films I independently visualized and interpreted imaging. I agree with the radiologist interpretation  Additional history obtained from wife at bedside.  External records from outside source obtained and reviewed including prior evaluations and imaging  Cardiac Monitoring: The patient was maintained on a cardiac monitor.  I personally viewed and interpreted the cardiac monitored which showed an underlying rhythm of: Sinus rhythm  Reevaluation: After the interventions noted above, I reevaluated the patient and found that they have :stayed the same  Social Determinants of Health:  lives independently  Disposition: Transfer for MRI  Co morbidities that complicate the patient evaluation  Past Medical History:  Diagnosis Date   Arthritis    Coronary artery disease    Stent 2004   GERD (gastroesophageal reflux disease)    History of echocardiogram    Echo 5/17: mild LVH, EF 50-55%, no RWMA, Gr 2 DD, mild to mod AI, PASP 32 mmHg   History of nuclear stress test    Myoview 5/17 - EF 48%, no scar, no ischemia. 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    Lung nodules    noted on past CT scans   Malaria    SVT (supraventricular tachycardia) (HCC)    remote episode during exercise test   Thoracic aortic aneurysm [facility])    last scan in Jan 2013. Now measuring 5.0cm. Referred to Dr. Servando Snare   Wears dentures    bottom   Wears glasses      Medicines Meds ordered this encounter  Medications   potassium chloride 10 mEq in 100 mL IVPB   potassium chloride SA (KLOR-CON M) CR tablet 40 mEq    I have reviewed the patients home medicines and have made adjustments as needed  Problem List / ED Course: Problem List Items Addressed This Visit   None Visit Diagnoses     Right foot drop    -  Primary   Hypokalemia                       Final Clinical Impression(s) / ED Diagnoses Final diagnoses:  Right foot drop  Hypokalemia    Rx / DC Orders ED Discharge Orders     None         Merryl Hacker, MD 04/16/22 [phone]

## 2022-04-16 NOTE — ED Notes (Signed)
SBAR report given to [facility] ED charge RN and [facility] at this time.

## 2022-04-16 NOTE — ED Provider Notes (Signed)
I assumed care of patient after transfer from [place] Patient was sent for further evaluation of right foot drop. This seemed to worsen after a flight from [place] with significant walking in the airports Patient reports she has she started having symptoms last week, but worsened over the past day.  He reports some numbness in his right foot.  He denies any other weakness.  No chest pain or abdominal pain.  No new back pain.  He reports chronic low back pain.  He reports previous history of spinal injections, but no spinal surgery Patient is awake and alert. Distal pulses are equal and intact in all 4 extremities.  No arm or leg drift. Patient with weakness of right dorsiflexion. He reports numbness to the right foot. Patient has appropriate strength with right foot inversion, but weakness with eversion He has mild tenderness over the right fibular head.  No recent trauma Working diagnosis is L5 radiculopathy versus peroneal neuropathy. Will cancel MRI brain, proceed with MRI lumbar spine without contrast   Ripley Fraise, MD 04/16/22 [phone]

## 2022-04-16 NOTE — ED Provider Notes (Signed)
Signed out to Dr. Godfrey Pick at shift change.  Plan to follow-up on MRI lumbar spine.  After this patient can be discharged home, if negative can be referred to neurology   Ripley Fraise, MD 04/16/22 [phone]

## 2022-04-16 NOTE — ED Triage Notes (Signed)
Pt arrives from [facility] c/o right foot weakness x 1 week. Pt reports that foot will go limp and numb and he is unable to walk or put weight on it. Pt transferred here for MRI per EMS

## 2022-04-16 NOTE — ED Notes (Signed)
Patient, belongings, paperwork sent to [facility] at this time via [facility] 

## 2022-04-16 NOTE — ED Notes (Signed)
Discharge instructions reviewed with patient. Patient denies any questions or concerns. Patient out to lobby with spouse.

## 2022-04-16 NOTE — ED Provider Notes (Signed)
Care of patient assumed from Dr. Christy Gentles.  This patient has had new onset of right foot drop over the past week.  He is currently awaiting MRI of lumbar spine.  Discharge is anticipated. Physical Exam  BP (!) 173/77   Pulse (!) 48   Temp 97.8 F (36.6 C)   Resp 14   Ht 6\' 7"  (2.007 m)   Wt 98.9 kg   SpO2 98%   BMI 24.56 kg/m   Physical Exam Vitals and nursing note reviewed.  Constitutional:      General: He is not in acute distress.    Appearance: Normal appearance. He is well-developed. He is not ill-appearing, toxic-appearing or diaphoretic.  HENT:     Head: Normocephalic and atraumatic.     Right Ear: External ear normal.     Left Ear: External ear normal.     Nose: Nose normal.  Eyes:     Extraocular Movements: Extraocular movements intact.     Conjunctiva/sclera: Conjunctivae normal.  Cardiovascular:     Rate and Rhythm: Normal rate.  Pulmonary:     Effort: Pulmonary effort is normal. No respiratory distress.  Abdominal:     General: There is no distension.     Palpations: Abdomen is soft.  Musculoskeletal:        General: No swelling. Normal range of motion.     Cervical back: Normal range of motion and neck supple.     Right lower leg: No edema.     Left lower leg: No edema.  Skin:    General: Skin is warm and dry.     Capillary Refill: Capillary refill takes less than 2 seconds.     Coloration: Skin is not jaundiced or pale.  Neurological:     Mental Status: He is alert and oriented to person, place, and time.     Cranial Nerves: No cranial nerve deficit.     Comments: Minimally diminished right ankle dorsiflexion  Psychiatric:        Mood and Affect: Mood normal.        Behavior: Behavior normal.     Procedures  Procedures  ED Course / MDM    Medical Decision Making Amount and/or Complexity of Data Reviewed Labs: ordered. Radiology: ordered.  Risk Prescription drug management.   On assessment, patient sitting on ED stretcher, resting  comfortably.  He does have preserved dorsiflexion of his right ankle.  It is slightly diminished when compared to the left.  He underwent MRI of lumbar spine which was negative for acute findings.  He was given contact information for neurology follow-up for possible nerve studies.  Patient was discharged in good condition.       Godfrey Pick, MD 04/16/22 [phone]

## 2022-04-16 NOTE — Discharge Instructions (Signed)
Today, your potassium was low.  You were given potassium replacement in the ER.  You should follow-up with your primary care doctor for repeat lab work to ensure that potassium stays normal.  Your MRI did not show any new findings.  There is a telephone number below that you can call to set up a follow-up appointment with a neurologist.  Neurologist can offer further nerve studies.  Return to the emergency department for any new or worsening symptoms of concern.

## 2022-04-19 DIAGNOSIS — R634 Abnormal weight loss: Secondary | ICD-10-CM | POA: Diagnosis not present

## 2022-04-19 DIAGNOSIS — I10 Essential (primary) hypertension: Secondary | ICD-10-CM | POA: Diagnosis not present

## 2022-04-19 DIAGNOSIS — E876 Hypokalemia: Secondary | ICD-10-CM | POA: Diagnosis not present

## 2022-04-19 DIAGNOSIS — E118 Type 2 diabetes mellitus with unspecified complications: Secondary | ICD-10-CM | POA: Diagnosis not present

## 2022-04-19 DIAGNOSIS — K219 Gastro-esophageal reflux disease without esophagitis: Secondary | ICD-10-CM | POA: Diagnosis not present

## 2022-04-19 DIAGNOSIS — M21371 Foot drop, right foot: Secondary | ICD-10-CM | POA: Diagnosis not present

## 2022-04-19 DIAGNOSIS — K5900 Constipation, unspecified: Secondary | ICD-10-CM | POA: Diagnosis not present

## 2022-04-19 DIAGNOSIS — M5136 Other intervertebral disc degeneration, lumbar region: Secondary | ICD-10-CM | POA: Diagnosis not present

## 2022-04-19 DIAGNOSIS — R1310 Dysphagia, unspecified: Secondary | ICD-10-CM | POA: Diagnosis not present

## 2022-04-23 ENCOUNTER — Other Ambulatory Visit: Payer: Self-pay | Admitting: Cardiovascular Disease

## 2022-04-23 NOTE — Telephone Encounter (Signed)
Prescription refill request for Eliquis received. Indication:a fib Last office visit:5/23 Scr:1.0 Age: 82 Weight:98.9  kg  Prescription refilled

## 2022-04-27 ENCOUNTER — Telehealth: Payer: Self-pay

## 2022-04-27 NOTE — Telephone Encounter (Signed)
        Patient  visited [facility] [place] on 04/16/2022  for Right foot drop.   Telephone encounter attempt :  1st  A HIPAA compliant voice message was left requesting a return call.  Instructed patient to call back at [phone].   [place] Resource Care Guide   [email or web address]  ?? [phone]   Website: [email or web address]  [email or web address]

## 2022-05-01 ENCOUNTER — Telehealth: Payer: Self-pay

## 2022-05-01 NOTE — Telephone Encounter (Signed)
        Patient  visited [facility] [place] on 04/16/2022  for Right foot drop.   Telephone encounter attempt :  2nd  A HIPAA compliant voice message was left requesting a return call.  Instructed patient to call back at [phone].   [place] Resource Care Guide   [email or web address]  ?? [phone]   Website: [email or web address]

## 2022-05-03 ENCOUNTER — Telehealth: Payer: Self-pay

## 2022-05-03 NOTE — Telephone Encounter (Signed)
        Patient  visited [facility] [place] on 04/16/2022  for Right foot drop.   Telephone encounter attempt :  3rd  A HIPAA compliant voice message was left requesting a return call.  Instructed patient to call back at [phone].   [place] Resource Care Guide   [email or web address]  ?? [phone]   Website: [email or web address]  [email or web address]

## 2022-05-06 NOTE — Progress Notes (Unsigned)
Electrophysiology Office Follow up Visit Note:    Date:  05/06/2022   ID:  Ronald [name], DOB [birth year], MRN [id number]  PCP:  Crist Infante, MD  [facility] Cardiologist:  Lauree Chandler, MD  [facility] Electrophysiologist:  Vickie Epley, MD    Interval History:    Ronald [name] is a 82 y.o. male who presents for a follow up visit.  Hx of CAD, HTN, HLD, SVT, Bells Palsy, AF, TAA. Previously followed by Dr Rayann Heman. On amiodarone given recurrent atypical atrial flutter.        Past Medical History:  Diagnosis Date   Arthritis    Coronary artery disease    Stent 2004   GERD (gastroesophageal reflux disease)    History of echocardiogram    Echo 5/17: mild LVH, EF 50-55%, no RWMA, Gr 2 DD, mild to mod AI, PASP 32 mmHg   History of nuclear stress test    Myoview 5/17 - EF 48%, no scar, no ischemia. 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    Lung nodules    noted on past CT scans   Malaria    SVT (supraventricular tachycardia) (HCC)    remote episode during exercise test   Thoracic aortic aneurysm [facility])    last scan in Jan 2013. Now measuring 5.0cm. Referred to Dr. Servando Snare   Wears dentures    bottom   Wears glasses     Past Surgical History:  Procedure Laterality Date   ATRIAL FIBRILLATION ABLATION N/A 05/12/2019   Procedure: ATRIAL FIBRILLATION ABLATION;  Surgeon: Thompson Grayer, MD;  Location: [place] CV LAB;  Service: Cardiovascular;  Laterality: N/A;   CARDIOVERSION N/A 03/13/2019   Procedure: CARDIOVERSION;  Surgeon: Fay Records, MD;  Location: [place];  Service: Cardiovascular;  Laterality: N/A;   CARDIOVERSION N/A 04/15/2019   Procedure: CARDIOVERSION;  Surgeon: Thayer Headings, MD;  Location:  [facility] ENDOSCOPY;  Service: Cardiovascular;  Laterality: N/A;   CARDIOVERSION N/A 06/16/2019   Procedure: CARDIOVERSION;  Surgeon: Skeet Latch, MD;  Location: [place];  Service: Cardiovascular;  Laterality: N/A;   CERVICAL FUSION  03-2010   c2-c7   COLONOSCOPY     x4   CORONARY STENT PLACEMENT  2004   dental implant     x 2   ELBOW SURGERY  1997   lt and rt   EYE SURGERY     HIP PINNING,CANNULATED Right 05/11/2014   Procedure: CANNULATED HIP PINNING;  Surgeon: Renette Butters, MD;  Location: [place];  Service: Orthopedics;  Laterality: Right;   RETINAL DETACHMENT SURGERY  1975   lt   rotator cuff surgery  1997   lt   SHOULDER ARTHROSCOPY  2/15   right-got cardiac clearance-gsc   TONSILLECTOMY     TRIGGER FINGER RELEASE Left 10/12/2013   Procedure: LEFT LONG FINGER TRIGGER RELEASE;  Surgeon: Jolyn Nap, MD;  Location: [place];  Service: Orthopedics;  Laterality: Left;    Current Medications: No outpatient medications have been marked as taking for the 05/07/22 encounter (Appointment) with Vickie Epley, MD.     Allergies:   Quinolones, Promethazine hcl,  Vesicare [solifenacin succinate], Atorvastatin, and Ezetimibe-simvastatin   Social History   Socioeconomic History   Marital status: Married    Spouse name: Not on file   Number of children: 1   Years of education: Not on file   Highest education level: Not on file  Occupational History   Occupation: retired Games developer: RETIRED  Tobacco Use   Smoking status: Former   Smokeless tobacco: Current    Types: Chew   Tobacco comments:    reports that when he smoked it was intermittent  Vaping Use   Vaping Use: Unknown  Substance and Sexual Activity   Alcohol use: Yes    Alcohol/week: 1.0 standard drink of alcohol    Types: 1 Standard drinks or equivalent per week    Comment: occasional mainly during holiday   Drug use: No   Sexual activity: Yes  Other Topics Concern   Not  on file  Social History Narrative   Lives in [place]   Retired Patent attorney (Nurse, children's at [employer], [employer])   Social Determinants of Health   Financial Resource Strain: Not on file  Food Insecurity: Not on file  Transportation Needs: Not on file  Physical Activity: Not on file  Stress: Not on file  Social Connections: Not on file     Family History: The patient's family history includes Cancer (age of onset: 6) in his mother; Heart attack in his father; Heart disease in his father. There is no history of Hypertension or Stroke.  ROS:   Please see the history of present illness.    All other systems reviewed and are negative.  EKGs/Labs/Other Studies Reviewed:    The following studies were reviewed today: ***  EKG:  The ekg ordered today demonstrates ***  Recent Labs: 04/16/2022: ALT 44; BUN 20; Creatinine, Ser 1.09; Hemoglobin 13.1; Platelets 207; Potassium 2.9; Sodium 141  Recent Lipid Panel    Component Value Date/Time   CHOL 184 11/14/2010 1221   TRIG 143.0 11/14/2010 1221   HDL 48.60 11/14/2010 1221   CHOLHDL 4 11/14/2010 1221   VLDL 28.6 11/14/2010 1221   LDLCALC 107 (H) 11/14/2010 1221    Physical Exam:    VS:  There were no vitals taken for this visit.    Wt Readings from Last 3 Encounters:  04/15/22 218 lb (98.9 kg)  03/26/22 225 lb (102.1 kg)  10/31/21 245 lb (111.1 kg)     GEN: *** Well nourished, well developed in no acute distress CARDIAC: ***RRR, no murmurs, rubs, gallops RESPIRATORY:  Clear to auscultation without rales, wheezing or rhonchi        ASSESSMENT:    No diagnosis found. PLAN:    In order of problems listed above:   #Persistent AF/AFL Maintaining sinus on amiodarone. Cont Eliquis.  #Hypertension *** goal today.  Recommend checking blood pressures 1-2 times per week at home and recording the values.  Recommend bringing these recordings to the primary care physician.  Follow up 6 mo  with APP with CMP, TSH and FT4 at that appt.     Medication Adjustments/Labs and Tests Ordered: Current medicines are reviewed at length with the patient today.  Concerns regarding medicines are outlined above.  No orders of the defined types were placed in this encounter.  No orders of the defined types were placed in this encounter.    Signed, Lars Mage, MD, [facility], [facility] 05/06/2022 4:05 PM    Electrophysiology [place]  [facility] 

## 2022-05-07 ENCOUNTER — Ambulatory Visit: Payer: Medicare PPO | Attending: Cardiology | Admitting: Cardiology

## 2022-05-07 ENCOUNTER — Encounter: Payer: Self-pay | Admitting: Cardiology

## 2022-05-07 VITALS — BP 138/72 | HR 53 | Ht 79.0 in | Wt 243.0 lb

## 2022-05-07 DIAGNOSIS — Z79899 Other long term (current) drug therapy: Secondary | ICD-10-CM | POA: Diagnosis not present

## 2022-05-07 DIAGNOSIS — I10 Essential (primary) hypertension: Secondary | ICD-10-CM | POA: Diagnosis not present

## 2022-05-07 DIAGNOSIS — I4819 Other persistent atrial fibrillation: Secondary | ICD-10-CM

## 2022-05-07 NOTE — Patient Instructions (Signed)
Medication Instructions:  Your physician recommends that you continue on your current medications as directed. Please refer to the Current Medication list given to you today.  *If you need a refill on your cardiac medications before your next appointment, please call your pharmacy*  Follow-Up: At [facility], you and your health needs are our priority.  As part of our continuing mission to provide you with exceptional heart care, we have created designated Provider Care Teams.  These Care Teams include your primary Cardiologist (physician) and Advanced Practice Providers (APPs -  Physician Assistants and Nurse Practitioners) who all work together to provide you with the care you need, when you need it.  Your next appointment:   6 month(s)  Provider:   You will see one of the following Advanced Practice Providers on your designated Care Team:   Renee Ursuy, PA-C Michael "Andy" Tillery, PA-C Suzann Riddle, NP    

## 2022-05-07 NOTE — Progress Notes (Signed)
Electrophysiology Office Follow up Visit Note:    Date:  05/07/2022   ID:  NYMIR RINGLER, DOB Mar 07, 1941, MRN [id number]  PCP:  [name], MD  [facility] Cardiologist:  Verne Carrow, MD  [facility] Electrophysiologist:  Lanier Prude, MD    Interval History:    Ronald [name] is a 82 y.o. male who presents for a follow up visit.  Hx of CAD, HTN, HLD, SVT, Bells Palsy, AF, TAA. Previously followed by Dr Johney Frame. On amiodarone given recurrent atypical atrial flutter.  Today, he reports losing 70 pounds since our last visit. He was recently sick and experienced occasional loss of appetite and abdominal pain. He took insulin for 1 month for DM. At home, he has results such as 106 and 118.  He compliantly takes amiodarone 200 MG and Eliquis 5 MG.  He denies any chest pain, shortness of breath, or peripheral edema. No lightheadedness, headaches, syncope, orthopnea, or PND.       Past Medical History:  Diagnosis Date   Arthritis    Coronary artery disease    Stent 2004   GERD (gastroesophageal reflux disease)    History of echocardiogram    Echo 5/17: mild LVH, EF 50-55%, no RWMA, Gr 2 DD, mild to mod AI, PASP 32 mmHg   History of nuclear stress test    Myoview 5/17 - EF 48%, no scar, no ischemia. 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    Lung nodules    noted on past CT scans   Malaria    SVT (supraventricular tachycardia)    remote episode during exercise test   Thoracic aortic aneurysm [facility])    last scan in Jan 2013. Now measuring 5.0cm. Referred to Dr. Tyrone Sage   Wears dentures    bottom   Wears glasses     Past Surgical History:  Procedure Laterality Date   ATRIAL FIBRILLATION ABLATION N/A 05/12/2019    Procedure: ATRIAL FIBRILLATION ABLATION;  Surgeon: Hillis Range, MD;  Location: [facility] INVASIVE CV LAB;  Service: Cardiovascular;  Laterality: N/A;   CARDIOVERSION N/A 03/13/2019   Procedure: CARDIOVERSION;  Surgeon: Pricilla Riffle, MD;  Location: [facility] ENDOSCOPY;  Service: Cardiovascular;  Laterality: N/A;   CARDIOVERSION N/A 04/15/2019   Procedure: CARDIOVERSION;  Surgeon: Vesta Mixer, MD;  Location: [facility] ENDOSCOPY;  Service: Cardiovascular;  Laterality: N/A;   CARDIOVERSION N/A 06/16/2019   Procedure: CARDIOVERSION;  Surgeon: Chilton Si, MD;  Location: [facility];  Service: Cardiovascular;  Laterality: N/A;   CERVICAL FUSION  03-2010   c2-c7   COLONOSCOPY     x4   CORONARY STENT PLACEMENT  2004   dental implant     x 2   ELBOW SURGERY  1997   lt and rt   EYE SURGERY     HIP PINNING,CANNULATED Right 05/11/2014   Procedure: CANNULATED HIP PINNING;  Surgeon: Sheral Apley, MD;  Location: [facility] OR;  Service: Orthopedics;  Laterality: Right;   RETINAL DETACHMENT SURGERY  1975   lt   rotator cuff surgery  1997   lt   SHOULDER ARTHROSCOPY  2/15   right-got cardiac clearance-gsc  TONSILLECTOMY     TRIGGER FINGER RELEASE Left 10/12/2013   Procedure: LEFT LONG FINGER TRIGGER RELEASE;  Surgeon: Jolyn Nap, MD;  Location: [place];  Service: Orthopedics;  Laterality: Left;    Current Medications: Current Meds  Medication Sig   amiodarone (PACERONE) 200 MG tablet TAKE 1 TABLET BY MOUTH TWICE A DAY   blood glucose meter kit and supplies KIT Dispense based on patient and insurance preference. Use up to four times daily as directed.   chlorthalidone (HYGROTON) 25 MG tablet Take 12.5 mg by mouth every morning.   Coenzyme Q10 (COQ-10) 200 MG CAPS Take 200 mg by mouth every morning.   dicyclomine (BENTYL) 10 MG capsule 1 capsule 30 minutes before eating Orally Three times a day as needed   ELIQUIS 5 MG TABS tablet TAKE 1 TABLET BY MOUTH TWICE A DAY   ezetimibe (ZETIA) 10  MG tablet Take 10 mg by mouth every evening.   famotidine (PEPCID) 40 MG tablet Take 40 mg by mouth 2 (two) times daily.   gabapentin (NEURONTIN) 300 MG capsule 1 capsule Oral Once a day in evening   Glucosamine HCl 1500 MG TABS Take 3,000 mg by mouth daily.   LINZESS 145 MCG CAPS capsule Take 145 mcg by mouth daily.   lisinopril (PRINIVIL,ZESTRIL) 40 MG tablet Take 10 mg by mouth every evening. Take 1/4 tablet (10 mg) daily   LIVALO 4 MG TABS Take 2 mg by mouth at bedtime.    Multiple Vitamin (MULTIVITAMIN WITH MINERALS) TABS tablet Take 1 tablet by mouth daily.    nitroGLYCERIN (NITROSTAT) 0.4 MG SL tablet Place 1 tablet (0.4 mg total) under the tongue every 5 (five) minutes as needed for chest pain.   sucralfate (CARAFATE) 1 g tablet Take 1 g by mouth 3 (three) times daily.     Allergies:   Promethazine hcl, Vesicare [solifenacin succinate], and Atorvastatin   Social History   Socioeconomic History   Marital status: Married    Spouse name: Not on file   Number of children: 1   Years of education: Not on file   Highest education level: Not on file  Occupational History   Occupation: retired Games developer: RETIRED  Tobacco Use   Smoking status: Former   Smokeless tobacco: Current    Types: Chew   Tobacco comments:    reports that when he smoked it was intermittent  Vaping Use   Vaping Use: Unknown  Substance and Sexual Activity   Alcohol use: Yes    Alcohol/week: 1.0 standard drink of alcohol    Types: 1 Standard drinks or equivalent per week    Comment: occasional mainly during holiday   Drug use: No   Sexual activity: Yes  Other Topics Concern   Not on file  Social History Narrative   Lives in [place]   Retired Patent attorney (Nurse, children's at [employer], [employer])   Social Determinants of Health   Financial Resource Strain: Not on file  Food Insecurity: Not on file  Transportation Needs: Not on file  Physical Activity:  Not on file  Stress: Not on file  Social Connections: Not on file     Family History: The patient's family history includes Cancer (age of onset: 1) in his mother; Heart attack in his father; Heart disease in his father. There is no history of Hypertension or Stroke.  ROS:   Please see the history of present illness.    (+)  loss of appetite (+) significant weight loss All other systems reviewed and are negative.  EKGs/Labs/Other Studies Reviewed:    The following studies were reviewed today:   EKG:  EKG is personally reviewed. 05/07/22: Sinus rhythm.   Recent Labs: 04/16/2022: ALT 44; BUN 20; Creatinine, Ser 1.09; Hemoglobin 13.1; Platelets 207; Potassium 2.9; Sodium 141  Recent Lipid Panel    Component Value Date/Time   CHOL 184 11/14/2010 1221   TRIG 143.0 11/14/2010 1221   HDL 48.60 11/14/2010 1221   CHOLHDL 4 11/14/2010 1221   VLDL 28.6 11/14/2010 1221   LDLCALC 107 (H) 11/14/2010 1221    Physical Exam:    VS:  BP 138/72   Pulse (!) 53   Ht 6\' 7"  (2.007 m)   Wt 243 lb (110.2 kg)   SpO2 98%   BMI 27.38 kg/m     Wt Readings from Last 3 Encounters:  05/07/22 243 lb (110.2 kg)  04/15/22 218 lb (98.9 kg)  03/26/22 225 lb (102.1 kg)     GEN:  Well nourished, well developed in no acute distress CARDIAC: RRR, no murmurs, rubs, gallops RESPIRATORY:  Clear to auscultation without rales, wheezing or rhonchi        ASSESSMENT:    1. Persistent atrial fibrillation ([place])   2. Primary hypertension   3. Encounter for long-term (current) use of high-risk medication    PLAN:    In order of problems listed above:   #Persistent AF/AFL Maintaining sinus on amiodarone. Cont Eliquis.  #Hypertension At goal today.  Recommend checking blood pressures 1-2 times per week at home and recording the values.  Recommend bringing these recordings to the primary care physician.  Follow up 6 mo with APP      Medication Adjustments/Labs and Tests Ordered: Current  medicines are reviewed at length with the patient today.  Concerns regarding medicines are outlined above.  No orders of the defined types were placed in this encounter.  No orders of the defined types were placed in this encounter.   I,Mitra Faeizi,acting as a Education administrator for Vickie Epley, MD.,have documented all relevant documentation on the behalf of Vickie Epley, MD,as directed by  Vickie Epley, MD while in the presence of Vickie Epley, MD.  I, Vickie Epley, MD, have reviewed all documentation for this visit. The documentation on 05/07/22 for the exam, diagnosis, procedures, and orders are all accurate and complete.    Signed, Lars Mage, MD, [facility], [facility] 05/07/2022 8:15 AM    Electrophysiology  [facility]

## 2022-05-10 DIAGNOSIS — I739 Peripheral vascular disease, unspecified: Secondary | ICD-10-CM | POA: Diagnosis not present

## 2022-05-14 ENCOUNTER — Ambulatory Visit (HOSPITAL_COMMUNITY)
Admission: RE | Admit: 2022-05-14 | Discharge: 2022-05-14 | Disposition: A | Discharge status: Home / Self Care | Payer: Medicare PPO | Source: Ambulatory Visit | Attending: Surgery | Admitting: Surgery

## 2022-05-14 ENCOUNTER — Other Ambulatory Visit (HOSPITAL_COMMUNITY): Payer: Self-pay | Admitting: Neurosurgery

## 2022-05-14 DIAGNOSIS — I739 Peripheral vascular disease, unspecified: Secondary | ICD-10-CM

## 2022-05-14 LAB — VAS US ABI WITH/WO TBI
Left ABI: 1.35
Right ABI: 1.24

## 2022-05-22 ENCOUNTER — Encounter: Payer: Self-pay | Admitting: Vascular Surgery

## 2022-05-22 ENCOUNTER — Ambulatory Visit: Payer: Medicare PPO | Admitting: Vascular Surgery

## 2022-05-22 VITALS — BP 127/73 | HR 48 | Temp 98.0°F | Resp 18 | Ht 79.0 in | Wt 241.0 lb

## 2022-05-22 DIAGNOSIS — M21371 Foot drop, right foot: Secondary | ICD-10-CM

## 2022-05-22 NOTE — Progress Notes (Signed)
Patient name: Ronald [name] MRN: [id number] DOB: 03/31/1941 Sex: male  REASON FOR CONSULT: Right leg claudication  HPI: Ronald [name] is a 82 y.o. male, with history of coronary disease, hypertension, hyperlipidemia referred by Dr. Saintclair Halsted for evaluation of possible right leg claudication.  He states about a month ago he was traveling in the airport to [place] and developed sudden foot drop on the right.  States his foot was clunky.  He was evaluated by physician in the airport and told he may be having a stroke.  Ultimately came back to [facility] and was seen in the emergency room and sent to Dr. Saintclair Halsted with neurosurgery.  Dr. Saintclair Halsted did not feel that this was related to his back and he had no significant foraminal stenosis at L4-L5 to explain his foot drop.  He did send him to vascular surgery for further evaluation.  Patient states he has no pain in his legs when walking.  His foot drop is improved.  ABIs on 05/14/2022 are 1.24 on the right multiphasic and 1.35 on the left multiphasic  Past Medical History:  Diagnosis Date   Arthritis    Coronary artery disease    Stent 2004   GERD (gastroesophageal reflux disease)    History of echocardiogram    Echo 5/17: mild LVH, EF 50-55%, no RWMA, Gr 2 DD, mild to mod AI, PASP 32 mmHg   History of nuclear stress test    Myoview 5/17 - EF 48%, no scar, no ischemia. 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    Lung nodules    noted on past CT scans   Malaria    SVT (supraventricular tachycardia)    remote episode during exercise test   Thoracic aortic aneurysm [facility])    last scan in Jan 2013. Now measuring 5.0cm. Referred to Dr. Servando Snare   Wears dentures    bottom   Wears glasses     Past Surgical History:   Procedure Laterality Date   ATRIAL FIBRILLATION ABLATION N/A 05/12/2019   Procedure: ATRIAL FIBRILLATION ABLATION;  Surgeon: Thompson Grayer, MD;  Location: [place] [facility];  Service: Cardiovascular;  Laterality: N/A;   CARDIOVERSION N/A 03/13/2019   Procedure: CARDIOVERSION;  Surgeon: Fay Records, MD;  Location: [place];  Service: Cardiovascular;  Laterality: N/A;   CARDIOVERSION N/A 04/15/2019   Procedure: CARDIOVERSION;  Surgeon: Thayer Headings, MD;  Location: [facility] ENDOSCOPY;  Service: Cardiovascular;  Laterality: N/A;   CARDIOVERSION N/A 06/16/2019   Procedure: CARDIOVERSION;  Surgeon: Skeet Latch, MD;  Location: [place];  Service: Cardiovascular;  Laterality: N/A;   CERVICAL FUSION  03-2010   c2-c7   COLONOSCOPY     x4   CORONARY STENT PLACEMENT  2004   dental implant     x 2   ELBOW SURGERY  1997   lt and rt   EYE SURGERY     HIP PINNING,CANNULATED Right 05/11/2014   Procedure: CANNULATED HIP PINNING;  Surgeon: Renette Butters, MD;  Location: [place];  Service: Orthopedics;  Laterality: Right;   RETINAL DETACHMENT SURGERY  1975   lt   rotator cuff surgery  1997   lt   SHOULDER ARTHROSCOPY  2/15   right-got cardiac clearance-[facility]   TONSILLECTOMY     TRIGGER FINGER RELEASE Left 10/12/2013   Procedure: LEFT LONG FINGER TRIGGER RELEASE;  Surgeon: Jolyn Nap, MD;  Location: [place];  Service: Orthopedics;  Laterality: Left;    Family History  Problem Relation Age of Onset   Heart disease Father    Heart attack Father        43   Cancer Mother 65       LUNG   Hypertension Neg Hx    Stroke Neg Hx     SOCIAL HISTORY: Social History   Socioeconomic History   Marital status: Married    Spouse name: Not on file   Number of children: 1   Years of education: Not on file   Highest education level: Not on file  Occupational History   Occupation: retired Games developer: RETIRED  Tobacco Use   Smoking status: Former   Smokeless  tobacco: Current    Types: Chew   Tobacco comments:    reports that when he smoked it was intermittent  Vaping Use   Vaping Use: Unknown  Substance and Sexual Activity   Alcohol use: Yes    Alcohol/week: 1.0 standard drink of alcohol    Types: 1 Standard drinks or equivalent per week    Comment: occasional mainly during holiday   Drug use: No   Sexual activity: Yes  Other Topics Concern   Not on file  Social History Narrative   Lives in [place]   Retired Patent attorney (Nurse, children's at [employer], [employer])   Social Determinants of Health   Financial Resource Strain: Not on file  Food Insecurity: Not on file  Transportation Needs: Not on file  Physical Activity: Not on file  Stress: Not on file  Social Connections: Not on file  Intimate Partner Violence: Not on file    Allergies  Allergen Reactions   Promethazine Hcl Other (See Comments)    Agitation and incoherence (phenergan)   Vesicare [Solifenacin Succinate] Itching and Rash   Atorvastatin     Other reaction(s): Myalgia    Current Outpatient Medications  Medication Sig Dispense Refill   amiodarone (PACERONE) 200 MG tablet TAKE 1 TABLET BY MOUTH TWICE A DAY 180 tablet 3   blood glucose meter kit and supplies KIT Dispense based on patient and insurance preference. Use up to four times daily as directed. 1 each 0   chlorthalidone (HYGROTON) 25 MG tablet Take 12.5 mg by mouth every morning.     Coenzyme Q10 (COQ-10) 200 MG CAPS Take 200 mg by mouth every morning.     dicyclomine (BENTYL) 10 MG capsule 1 capsule 30 minutes before eating Orally Three times a day as needed     ELIQUIS 5 MG TABS tablet TAKE 1 TABLET BY MOUTH TWICE A DAY 60 tablet 5   famotidine (PEPCID) 40 MG tablet Take 40 mg by mouth 2 (two) times daily.     gabapentin (NEURONTIN) 300 MG capsule 1 capsule Oral Once a day in evening     Glucosamine HCl 1500 MG TABS Take 3,000 mg by mouth daily.     hydrocortisone 2.5 %  cream Apply 1 application  topically as needed (eye brows flakey). Use on head     Ivermectin (SOOLANTRA EX) Apply 1 application  topically daily as needed (Rosacea).     ketoconazole (NIZORAL)  2 % cream Apply 1 application  topically daily as needed for irritation (Corner of mouth (sores)).     LINZESS 145 MCG CAPS capsule Take 145 mcg by mouth daily.     lisinopril (PRINIVIL,ZESTRIL) 40 MG tablet Take 10 mg by mouth every evening. Take 1/4 tablet (10 mg) daily     LIVALO 4 MG TABS Take 2 mg by mouth at bedtime.      Multiple Vitamin (MULTIVITAMIN WITH MINERALS) TABS tablet Take 1 tablet by mouth daily.      mupirocin ointment (BACTROBAN) 2 % Apply 1 application  topically daily as needed (tick bites).     nitroGLYCERIN (NITROSTAT) 0.4 MG SL tablet Place 1 tablet (0.4 mg total) under the tongue every 5 (five) minutes as needed for chest pain. 25 tablet 1   sucralfate (CARAFATE) 1 g tablet Take 1 g by mouth 3 (three) times daily.     clobetasol ointment (TEMOVATE) [id number] % Apply 1 application topically 2 (two) times daily as needed (itchy scaly on legs).  (Patient not taking: Reported on 05/07/2022)     ezetimibe (ZETIA) 10 MG tablet Take 10 mg by mouth every evening. (Patient not taking: Reported on 05/22/2022)     fluticasone (FLONASE) 50 MCG/ACT nasal spray Place 1 spray into both nostrils daily as needed for allergies (sinus congestion). (Patient not taking: Reported on 05/07/2022)     No current facility-administered medications for this visit.    REVIEW OF SYSTEMS:  [X]$  denotes positive finding, [ ]$  denotes negative finding Cardiac  Comments:  Chest pain or chest pressure:    Shortness of breath upon exertion:    Short of breath when lying flat:    Irregular heart rhythm:        Vascular    Pain in calf, thigh, or hip brought on by ambulation:    Pain in feet at night that wakes you up from your sleep:     Blood clot in your veins:    Leg swelling:         Pulmonary    Oxygen at  home:    Productive cough:     Wheezing:         Neurologic    Sudden weakness in arms or legs:     Sudden numbness in arms or legs:     Sudden onset of difficulty speaking or slurred speech:    Temporary loss of vision in one eye:     Problems with dizziness:         Gastrointestinal    Blood in stool:     Vomited blood:         Genitourinary    Burning when urinating:     Blood in urine:        Psychiatric    Major depression:         Hematologic    Bleeding problems:    Problems with blood clotting too easily:        Skin    Rashes or ulcers:        Constitutional    Fever or chills:      PHYSICAL EXAM: Vitals:   05/22/22 1014  BP: 127/73  Pulse: (!) 48  Resp: 18  Temp: 98 F (36.7 C)  TempSrc: Temporal  SpO2: 97%  Weight: 241 lb (109.3 kg)  Height: 6' 7"$  (2.007 m)    GENERAL: The patient is a well-nourished male, in no acute distress. The vital signs are documented above.  CARDIAC: There is a regular rate and rhythm.  VASCULAR:  Palpable femoral pulses bilaterally Palpable DP pulses bilaterally No lower extremity tissue loss PULMONARY: No respiratory distress. ABDOMEN: Soft and non-tender.  MUSCULOSKELETAL: There are no major deformities or cyanosis. NEUROLOGIC: No focal weakness or paresthesias are detected.  Right foot seems to have 5/5 with plantar and dorsal flexion. SKIN: There are no ulcers or rashes noted. PSYCHIATRIC: The patient has a normal affect.  DATA:   ABIs on 05/14/2022 are 1.24 on the right multiphasic and 1.35 on the left multiphasic  Assessment/Plan:   82 y.o. male, with history of coronary disease, hypertension, hyperlipidemia presents for evaluation of possible right leg claudication.  He states about a month ago he was traveling in the airport to [place] and developed sudden foot drop on the right.  He was evaluated by physician in the airport and told he may be having a stroke.  Ultimately came back to [facility] and was seen  in the emergency room and sent to Dr. Saintclair Halsted with neurosurgery.  Dr. Saintclair Halsted did not feel that this was related to his back and he had no significant foraminal stenosis at L4-L5 to explain his foot drop.  He was sent to vascular surgery for further evaluation.    I discussed I do not believe this is related to arterial insufficiency.  He has a palpable dorsalis pedis pulse in the right foot.  His ABIs yesterday were normal with no evidence of significant arterial insufficiency.  I did review a CTA abdomen pelvis from 07/28/2021 and I see no significant aortoiliac disease with patent vessels.  His foot drop is improved.  I have recommended we send him to neurology for their input and further evaluation.  Fortunately his foot drop has significantly improved.   Marty Heck, MD [facility] Office: [phone]

## 2022-05-31 DIAGNOSIS — M21371 Foot drop, right foot: Secondary | ICD-10-CM | POA: Diagnosis not present

## 2022-05-31 DIAGNOSIS — E118 Type 2 diabetes mellitus with unspecified complications: Secondary | ICD-10-CM | POA: Diagnosis not present

## 2022-05-31 DIAGNOSIS — M5136 Other intervertebral disc degeneration, lumbar region: Secondary | ICD-10-CM | POA: Diagnosis not present

## 2022-05-31 DIAGNOSIS — R634 Abnormal weight loss: Secondary | ICD-10-CM | POA: Diagnosis not present

## 2022-05-31 DIAGNOSIS — E785 Hyperlipidemia, unspecified: Secondary | ICD-10-CM | POA: Diagnosis not present

## 2022-05-31 DIAGNOSIS — I10 Essential (primary) hypertension: Secondary | ICD-10-CM | POA: Diagnosis not present

## 2022-05-31 DIAGNOSIS — K219 Gastro-esophageal reflux disease without esophagitis: Secondary | ICD-10-CM | POA: Diagnosis not present

## 2022-06-12 DIAGNOSIS — I4891 Unspecified atrial fibrillation: Secondary | ICD-10-CM | POA: Diagnosis not present

## 2022-06-12 DIAGNOSIS — R109 Unspecified abdominal pain: Secondary | ICD-10-CM | POA: Diagnosis not present

## 2022-06-12 DIAGNOSIS — E876 Hypokalemia: Secondary | ICD-10-CM | POA: Diagnosis not present

## 2022-06-12 DIAGNOSIS — R634 Abnormal weight loss: Secondary | ICD-10-CM | POA: Diagnosis not present

## 2022-06-12 DIAGNOSIS — I2510 Atherosclerotic heart disease of native coronary artery without angina pectoris: Secondary | ICD-10-CM | POA: Diagnosis not present

## 2022-06-12 DIAGNOSIS — E119 Type 2 diabetes mellitus without complications: Secondary | ICD-10-CM | POA: Diagnosis not present

## 2022-06-27 DIAGNOSIS — K5900 Constipation, unspecified: Secondary | ICD-10-CM | POA: Diagnosis not present

## 2022-06-27 DIAGNOSIS — K219 Gastro-esophageal reflux disease without esophagitis: Secondary | ICD-10-CM | POA: Diagnosis not present

## 2022-06-29 ENCOUNTER — Other Ambulatory Visit: Payer: Self-pay | Admitting: Cardiovascular Disease

## 2022-06-29 NOTE — Telephone Encounter (Signed)
Prescription refill request for Eliquis received. Indication: afib  Last office visit: Quentin Ore 05/07/2022 Scr: 1.09, 04/16/2022 Age: 82 yo  Weight: 109.3 kg   Refill sent.

## 2022-08-07 DIAGNOSIS — H43813 Vitreous degeneration, bilateral: Secondary | ICD-10-CM | POA: Diagnosis not present

## 2022-08-07 DIAGNOSIS — Z961 Presence of intraocular lens: Secondary | ICD-10-CM | POA: Diagnosis not present

## 2022-08-07 DIAGNOSIS — E119 Type 2 diabetes mellitus without complications: Secondary | ICD-10-CM | POA: Diagnosis not present

## 2022-09-18 ENCOUNTER — Other Ambulatory Visit: Payer: Self-pay

## 2022-10-08 DIAGNOSIS — N1831 Chronic kidney disease, stage 3a: Secondary | ICD-10-CM | POA: Diagnosis not present

## 2022-10-08 DIAGNOSIS — E118 Type 2 diabetes mellitus with unspecified complications: Secondary | ICD-10-CM | POA: Diagnosis not present

## 2022-10-08 DIAGNOSIS — I129 Hypertensive chronic kidney disease with stage 1 through stage 4 chronic kidney disease, or unspecified chronic kidney disease: Secondary | ICD-10-CM | POA: Diagnosis not present

## 2022-10-08 DIAGNOSIS — I259 Chronic ischemic heart disease, unspecified: Secondary | ICD-10-CM | POA: Diagnosis not present

## 2022-10-08 DIAGNOSIS — N529 Male erectile dysfunction, unspecified: Secondary | ICD-10-CM | POA: Diagnosis not present

## 2022-10-08 DIAGNOSIS — D6869 Other thrombophilia: Secondary | ICD-10-CM | POA: Diagnosis not present

## 2022-10-08 DIAGNOSIS — R232 Flushing: Secondary | ICD-10-CM | POA: Diagnosis not present

## 2022-10-08 DIAGNOSIS — M5136 Other intervertebral disc degeneration, lumbar region: Secondary | ICD-10-CM | POA: Diagnosis not present

## 2022-10-08 DIAGNOSIS — M21371 Foot drop, right foot: Secondary | ICD-10-CM | POA: Diagnosis not present

## 2022-10-08 DIAGNOSIS — I4891 Unspecified atrial fibrillation: Secondary | ICD-10-CM | POA: Diagnosis not present

## 2022-10-08 DIAGNOSIS — E785 Hyperlipidemia, unspecified: Secondary | ICD-10-CM | POA: Diagnosis not present

## 2022-11-20 DIAGNOSIS — L821 Other seborrheic keratosis: Secondary | ICD-10-CM | POA: Diagnosis not present

## 2022-11-20 DIAGNOSIS — Z85828 Personal history of other malignant neoplasm of skin: Secondary | ICD-10-CM | POA: Diagnosis not present

## 2022-11-20 DIAGNOSIS — D171 Benign lipomatous neoplasm of skin and subcutaneous tissue of trunk: Secondary | ICD-10-CM | POA: Diagnosis not present

## 2022-11-20 DIAGNOSIS — D225 Melanocytic nevi of trunk: Secondary | ICD-10-CM | POA: Diagnosis not present

## 2022-11-20 DIAGNOSIS — D235 Other benign neoplasm of skin of trunk: Secondary | ICD-10-CM | POA: Diagnosis not present

## 2022-11-20 DIAGNOSIS — D485 Neoplasm of uncertain behavior of skin: Secondary | ICD-10-CM | POA: Diagnosis not present

## 2022-11-20 DIAGNOSIS — C44519 Basal cell carcinoma of skin of other part of trunk: Secondary | ICD-10-CM | POA: Diagnosis not present

## 2022-11-20 DIAGNOSIS — L080 Pyoderma: Secondary | ICD-10-CM | POA: Diagnosis not present

## 2022-12-02 NOTE — Progress Notes (Unsigned)
Cardiology Office Note:  .   Date:  12/03/2022  ID:  [name], DOB July 05, 1940, MRN [id number] PCP: Ronald Ran, MD  Ronald [name] Cardiologist:  Ronald Carrow, MD Electrophysiologist:  Ronald Prude, MD {  History of Present Illness: .   Ronald [name] is a 82 y.o. male with a past medical history of CAD, HTN, HLD, SVT, Bell's palsy, AF, TAA followed by EP here for follow-up.  Patient is on amiodarone given recurrent atypical atrial flutter.  He was seen by Ronald [name] January 2024 and at that time he had reported losing 70 pounds since his last visit.  Was recently sick and experienced occasional loss of appetite and abdominal pain.  He took insulin for 1 month for DM.  At home, he has results such as 106 and 2018 for his blood glucose.  Compliantly takes amiodarone 200 mg and Eliquis 5 mg.  Denied any chest pain, shortness of breath, peripheral edema, lightheadedness, headaches, syncope, orthopnea, and PND.  Today, he tells me he started to go to the [place] for some of his medications because he can get them cheaper.Symptoms today include feeling flushed at night at times.  No fever.  Walking 2 miles on the treadmill when he is holding on but feels wobbly when walking today.  No recent illness, cough, or congestion.  He has had a headache these past couple of mornings.  States that over the last several months he had a 70 pound weight decrease and nobody can tell him why.  He has had numerous tests.  He sees a GI doctor.  He states his stomach was recently upset due to some fried food.  He has had his gallbladder evaluated.  He is on a few different medications for his stomach.  We discussed refilling his cardiac meds today. He does have some lower extremity edema today but states when he wears his compression hose is gone.   Reports no shortness of breath nor dyspnea on exertion. Reports no chest pain, pressure, or tightness. No orthopnea, PND. Reports no  palpitations   ROS: Pertinent ROS in HPI.  Studies Reviewed: Marland Kitchen       ABIs 05/14/2022 Summary:  Right: Resting right ankle-brachial index is within normal range. The  right toe-brachial index is normal.   Left: Resting left ankle-brachial index indicates noncompressible left  lower extremity arteries. The left toe-brachial index is normal.       Physical Exam:   VS:  BP (!) 142/70   Pulse 61   Ht 6\' 7"  (2.007 m)   Wt 259 lb 3.2 oz (117.6 kg)   SpO2 95%   BMI 29.20 kg/m    Wt Readings from Last 3 Encounters:  12/03/22 259 lb 3.2 oz (117.6 kg)  05/22/22 241 lb (109.3 kg)  05/07/22 243 lb (110.2 kg)    GEN: Well nourished, well developed in no acute distress NECK: No JVD; No carotid bruits CARDIAC: RRR, no murmurs, rubs, gallops RESPIRATORY:  Clear to auscultation without rales, wheezing or rhonchi  ABDOMEN: Soft, non-tender, non-distended EXTREMITIES:  No edema; No deformity   ASSESSMENT AND PLAN: .   1.  Persistent atrial fibrillation/atrial flutter -he had on DCCV, then ablation, then DCCV -recently Amio has been reduced but unsure of the dose -liver number were normal back in 12/22 but no recent  -he has some blood work Jan 7th with his PCP -For now, continue current medications including Eliquis 5 mg twice a day -We have  refilled his Eliquis and nitro today  2.  HTN -he thinks he took his medications -BP keeping track at home -Slightly elevated today in the clinic 142/70 -Continue low-sodium, heart healthy diet  3.  HLD -he has issues with statins -he was taken off zetia and he is unsure why -update labs in January   4. Stomach pain -he saw a GI PA -lots of xrays of his stomach -He is currently on 2 medications for this.      Dispo: He can follow-up with Dr. Clifton [name] in a year.  Signed, Sharlene Dory, PA-C

## 2022-12-03 ENCOUNTER — Ambulatory Visit: Payer: Medicare PPO | Attending: Physician Assistant | Admitting: Physician Assistant

## 2022-12-03 ENCOUNTER — Encounter: Payer: Self-pay | Admitting: Physician Assistant

## 2022-12-03 VITALS — BP 142/70 | HR 61 | Ht 79.0 in | Wt 259.2 lb

## 2022-12-03 DIAGNOSIS — I77810 Thoracic aortic ectasia: Secondary | ICD-10-CM

## 2022-12-03 DIAGNOSIS — I484 Atypical atrial flutter: Secondary | ICD-10-CM | POA: Diagnosis not present

## 2022-12-03 DIAGNOSIS — I4819 Other persistent atrial fibrillation: Secondary | ICD-10-CM | POA: Diagnosis not present

## 2022-12-03 DIAGNOSIS — R0789 Other chest pain: Secondary | ICD-10-CM

## 2022-12-03 DIAGNOSIS — I10 Essential (primary) hypertension: Secondary | ICD-10-CM | POA: Diagnosis not present

## 2022-12-03 DIAGNOSIS — R109 Unspecified abdominal pain: Secondary | ICD-10-CM

## 2022-12-03 DIAGNOSIS — E785 Hyperlipidemia, unspecified: Secondary | ICD-10-CM | POA: Diagnosis not present

## 2022-12-03 DIAGNOSIS — Z79899 Other long term (current) drug therapy: Secondary | ICD-10-CM | POA: Diagnosis not present

## 2022-12-03 MED ORDER — NITROGLYCERIN 0.4 MG SL SUBL: 0.4000 mg | SUBLINGUAL_TABLET | SUBLINGUAL | 1 refills | Status: AC | PRN

## 2022-12-03 MED ORDER — APIXABAN 5 MG PO TABS: 5.0000 mg | ORAL_TABLET | Freq: Two times a day (BID) | ORAL | 5 refills | Status: AC

## 2022-12-03 NOTE — Patient Instructions (Signed)
Medication Instructions:  Your physician recommends that you continue on your current medications as directed. Please refer to the Current Medication list given to you today.  *If you need a refill on your cardiac medications before your next appointment, please call your pharmacy*  Lab Work: When your primary care provider draws your labs, have them fax [place] a copy [phone] If you have labs (blood work) drawn today and your tests are completely normal, you will receive your results only by: MyChart Message (if you have MyChart) OR A paper copy in the mail If you have any lab test that is abnormal or we need to change your treatment, we will call you to review the results.   Testing/Procedures: Your physician has requested that you have an echocardiogram. Echocardiography is a painless test that uses sound waves to create images of your heart. It provides your doctor with information about the size and shape of your heart and how well your heart's chambers and valves are working. This procedure takes approximately one hour. There are no restrictions for this procedure. Please do NOT wear cologne, perfume, aftershave, or lotions (deodorant is allowed). Please arrive 15 minutes prior to your appointment time.    Follow-Up: At [facility], you and your health needs are our priority.  As part of our continuing mission to provide you with exceptional heart care, we have created designated Provider Care Teams.  These Care Teams include your primary Cardiologist (physician) and Advanced Practice Providers (APPs -  Physician Assistants and Nurse Practitioners) who all work together to provide you with the care you need, when you need it.   Your next appointment:   6 month(s)  Provider:   Verne Carrow, MD     Other Instructions Check your blood pressure daily, 1 hr after morning medications for 2 weeks, keep a log and send [place] the readings through mychart at the end of the 2  weeks.   Heart-Healthy Eating Plan Many factors influence your heart health, including eating and exercise habits. Heart health is also called coronary health. Coronary risk increases with abnormal blood fat (lipid) levels. A heart-healthy eating plan includes limiting unhealthy fats, increasing healthy fats, limiting salt (sodium) intake, and making other diet and lifestyle changes. What is my plan? Your health care provider may recommend that: You limit your fat intake to _________% or less of your total calories each day. You limit your saturated fat intake to _________% or less of your total calories each day. You limit the amount of cholesterol in your diet to less than _________ mg per day. You limit the amount of sodium in your diet to less than _________ mg per day. What are tips for following this plan? Cooking Cook foods using methods other than frying. Baking, boiling, grilling, and broiling are all good options. Other ways to reduce fat include: Removing the skin from poultry. Removing all visible fats from meats. Steaming vegetables in water or broth. Meal planning  At meals, imagine dividing your plate into fourths: Fill one-half of your plate with vegetables and green salads. Fill one-fourth of your plate with whole grains. Fill one-fourth of your plate with lean protein foods. Eat 2-4 cups of vegetables per day. One cup of vegetables equals 1 cup (91 g) broccoli or cauliflower florets, 2 medium carrots, 1 large bell pepper, 1 large sweet potato, 1 large tomato, 1 medium white potato, 2 cups (150 g) raw leafy greens. Eat 1-2 cups of fruit per day. One cup of  fruit equals 1 small apple, 1 large banana, 1 cup (237 g) mixed fruit, 1 large orange,  cup (82 g) dried fruit, 1 cup (240 mL) 100% fruit juice. Eat more foods that contain soluble fiber. Examples include apples, broccoli, carrots, beans, peas, and barley. Aim to get 25-30 g of fiber per day. Increase your consumption  of legumes, nuts, and seeds to 4-5 servings per week. One serving of dried beans or legumes equals  cup (90 g) cooked, 1 serving of nuts is  oz (12 almonds, 24 pistachios, or 7 walnut halves), and 1 serving of seeds equals  oz (8 g). Fats Choose healthy fats more often. Choose monounsaturated and polyunsaturated fats, such as olive and canola oils, avocado oil, flaxseeds, walnuts, almonds, and seeds. Eat more omega-3 fats. Choose salmon, mackerel, sardines, tuna, flaxseed oil, and ground flaxseeds. Aim to eat fish at least 2 times each week. Check food labels carefully to identify foods with trans fats or high amounts of saturated fat. Limit saturated fats. These are found in animal products, such as meats, butter, and cream. Plant sources of saturated fats include palm oil, palm kernel oil, and coconut oil. Avoid foods with partially hydrogenated oils in them. These contain trans fats. Examples are stick margarine, some tub margarines, cookies, crackers, and other baked goods. Avoid fried foods. General information Eat more home-cooked food and less restaurant, buffet, and fast food. Limit or avoid alcohol. Limit foods that are high in added sugar and simple starches such as foods made using white refined flour (white breads, pastries, sweets). Lose weight if you are overweight. Losing just 5-10% of your body weight can help your overall health and prevent diseases such as diabetes and heart disease. Monitor your sodium intake, especially if you have high blood pressure. Talk with your health care provider about your sodium intake. Try to incorporate more vegetarian meals weekly. What foods should I eat? Fruits All fresh, canned (in natural juice), or frozen fruits. Vegetables Fresh or frozen vegetables (raw, steamed, roasted, or grilled). Green salads. Grains Most grains. Choose whole wheat and whole grains most of the time. Rice and pasta, including brown rice and pastas made with whole  wheat. Meats and other proteins Lean, well-trimmed beef, veal, pork, and lamb. Chicken and [place] without skin. All fish and shellfish. Wild duck, rabbit, pheasant, and venison. Egg whites or low-cholesterol egg substitutes. Dried beans, peas, lentils, and tofu. Seeds and most nuts. Dairy Low-fat or nonfat cheeses, including ricotta and mozzarella. Skim or 1% milk (liquid, powdered, or evaporated). Buttermilk made with low-fat milk. Nonfat or low-fat yogurt. Fats and oils Non-hydrogenated (trans-free) margarines. Vegetable oils, including soybean, sesame, sunflower, olive, avocado, peanut, safflower, corn, canola, and cottonseed. Salad dressings or mayonnaise made with a vegetable oil. Beverages Water (mineral or sparkling). Coffee and tea. Unsweetened ice tea. Diet beverages. Sweets and desserts Sherbet, gelatin, and fruit ice. Small amounts of dark chocolate. Limit all sweets and desserts. Seasonings and condiments All seasonings and condiments. The items listed above may not be a complete list of foods and beverages you can eat. Contact a dietitian for more options. What foods should I avoid? Fruits Canned fruit in heavy syrup. Fruit in cream or butter sauce. Fried fruit. Limit coconut. Vegetables Vegetables cooked in cheese, cream, or butter sauce. Fried vegetables. Grains Breads made with saturated or trans fats, oils, or whole milk. Croissants. Sweet rolls. Donuts. High-fat crackers, such as cheese crackers and chips. Meats and other proteins Fatty meats, such as hot dogs,  ribs, sausage, bacon, rib-eye roast or steak. High-fat deli meats, such as salami and bologna. Caviar. Domestic duck and goose. Organ meats, such as liver. Dairy Cream, sour cream, cream cheese, and creamed cottage cheese. Whole-milk cheeses. Whole or 2% milk (liquid, evaporated, or condensed). Whole buttermilk. Cream sauce or high-fat cheese sauce. Whole-milk yogurt. Fats and oils Meat fat, or shortening. Cocoa  butter, hydrogenated oils, palm oil, coconut oil, palm kernel oil. Solid fats and shortenings, including bacon fat, salt pork, lard, and butter. Nondairy cream substitutes. Salad dressings with cheese or sour cream. Beverages Regular sodas and any drinks with added sugar. Sweets and desserts Frosting. Pudding. Cookies. Cakes. Pies. Milk chocolate or white chocolate. Buttered syrups. Full-fat ice cream or ice cream drinks. The items listed above may not be a complete list of foods and beverages to avoid. Contact a dietitian for more information. Summary Heart-healthy meal planning includes limiting unhealthy fats, increasing healthy fats, limiting salt (sodium) intake and making other diet and lifestyle changes. Lose weight if you are overweight. Losing just 5-10% of your body weight can help your overall health and prevent diseases such as diabetes and heart disease. Focus on eating a balance of foods, including fruits and vegetables, low-fat or nonfat dairy, lean protein, nuts and legumes, whole grains, and heart-healthy oils and fats. This information is not intended to replace advice given to you by your health care provider. Make sure you discuss any questions you have with your health care provider. Document Revised: 05/01/2021 Document Reviewed: 05/01/2021 Elsevier Patient Education  2024 [employer] Low-Sodium Eating Plan Salt (sodium) helps you keep a healthy balance of fluids in your body. Too much sodium can raise your blood pressure. It can also cause fluid and waste to be held in your body. Your health care provider or dietitian may recommend a low-sodium eating plan if you have high blood pressure (hypertension), kidney disease, liver disease, or heart failure. Eating less sodium can help lower your blood pressure and reduce swelling. It can also protect your heart, liver, and kidneys. What are tips for following this plan? Reading food labels  Check food labels for the amount of  sodium per serving. If you eat more than one serving, you must multiply the listed amount by the number of servings. Choose foods with less than 140 milligrams (mg) of sodium per serving. Avoid foods with 300 mg of sodium or more per serving. Always check how much sodium is in a product, even if the label says "unsalted" or "no salt added." Shopping  Buy products labeled as "low-sodium" or "no salt added." Buy fresh foods. Avoid canned foods and pre-made or frozen meals. Avoid canned, cured, or processed meats. Buy breads that have less than 80 mg of sodium per slice. Cooking  Eat more home-cooked food. Try to eat less restaurant, buffet, and fast food. Try not to add salt when you cook. Use salt-free seasonings or herbs instead of table salt or sea salt. Check with your provider or pharmacist before using salt substitutes. Cook with plant-based oils, such as canola, sunflower, or olive oil. Meal planning When eating at a restaurant, ask if your food can be made with less salt or no salt. Avoid dishes labeled as brined, pickled, cured, or smoked. Avoid dishes made with soy sauce, miso, or teriyaki sauce. Avoid foods that have monosodium glutamate (MSG) in them. MSG may be added to some restaurant food, sauces, soups, bouillon, and canned foods. Make meals that can be grilled, baked, poached, roasted,  or steamed. These are often made with less sodium. General information Try to limit your sodium intake to 1,500-2,300 mg each day, or the amount told by your provider. What foods should I eat? Fruits Fresh, frozen, or canned fruit. Fruit juice. Vegetables Fresh or frozen vegetables. "No salt added" canned vegetables. "No salt added" tomato sauce and paste. Low-sodium or reduced-sodium tomato and vegetable juice. Grains Low-sodium cereals, such as oats, puffed wheat and rice, and shredded wheat. Low-sodium crackers. Unsalted rice. Unsalted pasta. Low-sodium bread. Whole grain breads and whole  grain pasta. Meats and other proteins Fresh or frozen meat, poultry, seafood, and fish. These should have no added salt. Low-sodium canned tuna and salmon. Unsalted nuts. Dried peas, beans, and lentils without added salt. Unsalted canned beans. Eggs. Unsalted nut butters. Dairy Milk. Soy milk. Cheese that is naturally low in sodium, such as ricotta cheese, fresh mozzarella, or Swiss cheese. Low-sodium or reduced-sodium cheese. Cream cheese. Yogurt. Seasonings and condiments Fresh and dried herbs and spices. Salt-free seasonings. Low-sodium mustard and ketchup. Sodium-free salad dressing. Sodium-free light mayonnaise. Fresh or refrigerated horseradish. Lemon juice. Vinegar. Other foods Homemade, reduced-sodium, or low-sodium soups. Unsalted popcorn and pretzels. Low-salt or salt-free chips. The items listed above may not be all the foods and drinks you can have. Talk to a dietitian to learn more. What foods should I avoid? Vegetables Sauerkraut, pickled vegetables, and relishes. Olives. [place] fries. Onion rings. Regular canned vegetables, except low-sodium or reduced-sodium items. Regular canned tomato sauce and paste. Regular tomato and vegetable juice. Frozen vegetables in sauces. Grains Instant hot cereals. Bread stuffing, pancake, and biscuit mixes. Croutons. Seasoned rice or pasta mixes. Noodle soup cups. Boxed or frozen macaroni and cheese. Regular salted crackers. Self-rising flour. Meats and other proteins Meat or fish that is salted, canned, smoked, spiced, or pickled. Precooked or cured meat, such as sausages or meat loaves. Tomasa Blase. Ham. Pepperoni. Hot dogs. Corned beef. Chipped beef. Salt pork. Jerky. Pickled herring, anchovies, and sardines. Regular canned tuna. Salted nuts. Dairy Processed cheese and cheese spreads. Hard cheeses. Cheese curds. Blue cheese. Feta cheese. String cheese. Regular cottage cheese. Buttermilk. Canned milk. Fats and oils Salted butter. Regular margarine. Ghee.  Bacon fat. Seasonings and condiments Onion salt, garlic salt, seasoned salt, table salt, and sea salt. Canned and packaged gravies. Worcestershire sauce. Tartar sauce. Barbecue sauce. Teriyaki sauce. Soy sauce, including reduced-sodium soy sauce. Steak sauce. Fish sauce. Oyster sauce. Cocktail sauce. Horseradish that you find on the shelf. Regular ketchup and mustard. Meat flavorings and tenderizers. Bouillon cubes. Hot sauce. Pre-made or packaged marinades. Pre-made or packaged taco seasonings. Relishes. Regular salad dressings. Salsa. Other foods Salted popcorn and pretzels. Corn chips and puffs. Potato and tortilla chips. Canned or dried soups. Pizza. Frozen entrees and pot pies. The items listed above may not be all the foods and drinks you should avoid. Talk to a dietitian to learn more. This information is not intended to replace advice given to you by your health care provider. Make sure you discuss any questions you have with your health care provider. Document Revised: 04/12/2022 Document Reviewed: 04/12/2022 Elsevier Patient Education  2024 [employer].

## 2022-12-20 ENCOUNTER — Emergency Department (HOSPITAL_BASED_OUTPATIENT_CLINIC_OR_DEPARTMENT_OTHER): Payer: Medicare PPO | Admitting: Radiology

## 2022-12-20 ENCOUNTER — Encounter (HOSPITAL_BASED_OUTPATIENT_CLINIC_OR_DEPARTMENT_OTHER): Payer: Self-pay

## 2022-12-20 ENCOUNTER — Other Ambulatory Visit: Payer: Self-pay

## 2022-12-20 ENCOUNTER — Emergency Department (HOSPITAL_BASED_OUTPATIENT_CLINIC_OR_DEPARTMENT_OTHER)
Admission: EM | Admit: 2022-12-20 | Discharge: 2022-12-20 | Disposition: A | Discharge status: Home / Self Care | Payer: Medicare PPO | Attending: Emergency Medicine | Admitting: Emergency Medicine

## 2022-12-20 ENCOUNTER — Emergency Department (HOSPITAL_BASED_OUTPATIENT_CLINIC_OR_DEPARTMENT_OTHER): Payer: Medicare PPO

## 2022-12-20 DIAGNOSIS — Z79899 Other long term (current) drug therapy: Secondary | ICD-10-CM | POA: Insufficient documentation

## 2022-12-20 DIAGNOSIS — R0789 Other chest pain: Secondary | ICD-10-CM | POA: Diagnosis not present

## 2022-12-20 DIAGNOSIS — I2510 Atherosclerotic heart disease of native coronary artery without angina pectoris: Secondary | ICD-10-CM | POA: Diagnosis not present

## 2022-12-20 DIAGNOSIS — R079 Chest pain, unspecified: Secondary | ICD-10-CM | POA: Diagnosis not present

## 2022-12-20 DIAGNOSIS — I1A0 Resistant hypertension: Secondary | ICD-10-CM | POA: Diagnosis not present

## 2022-12-20 DIAGNOSIS — E119 Type 2 diabetes mellitus without complications: Secondary | ICD-10-CM | POA: Diagnosis not present

## 2022-12-20 DIAGNOSIS — J984 Other disorders of lung: Secondary | ICD-10-CM | POA: Diagnosis not present

## 2022-12-20 DIAGNOSIS — I10 Essential (primary) hypertension: Secondary | ICD-10-CM | POA: Diagnosis not present

## 2022-12-20 DIAGNOSIS — R519 Headache, unspecified: Secondary | ICD-10-CM | POA: Diagnosis not present

## 2022-12-20 DIAGNOSIS — Z7901 Long term (current) use of anticoagulants: Secondary | ICD-10-CM | POA: Diagnosis not present

## 2022-12-20 DIAGNOSIS — R42 Dizziness and giddiness: Secondary | ICD-10-CM | POA: Diagnosis not present

## 2022-12-20 DIAGNOSIS — R600 Localized edema: Secondary | ICD-10-CM | POA: Insufficient documentation

## 2022-12-20 DIAGNOSIS — R918 Other nonspecific abnormal finding of lung field: Secondary | ICD-10-CM | POA: Diagnosis not present

## 2022-12-20 DIAGNOSIS — R001 Bradycardia, unspecified: Secondary | ICD-10-CM | POA: Diagnosis not present

## 2022-12-20 LAB — BASIC METABOLIC PANEL
Anion gap: 11 (ref 5–15)
BUN: 24 mg/dL — ABNORMAL HIGH (ref 8–23)
CO2: 31 mmol/L (ref 22–32)
Calcium: 8.4 mg/dL — ABNORMAL LOW (ref 8.9–10.3)
Chloride: 105 mmol/L (ref 98–111)
Creatinine, Ser: 1.12 mg/dL (ref 0.61–1.24)
GFR, Estimated: >60 mL/min (ref >60)
Glucose, Bld: 124 mg/dL — ABNORMAL HIGH (ref 70–99)
Potassium: 3.1 mmol/L — ABNORMAL LOW (ref 3.5–5.1)
Sodium: 147 mmol/L — ABNORMAL HIGH (ref 135–145)

## 2022-12-20 LAB — TROPONIN I (HIGH SENSITIVITY)
Troponin I (High Sensitivity): 19 ng/L — ABNORMAL HIGH (ref <18)
Troponin I (High Sensitivity): 21 ng/L — ABNORMAL HIGH (ref <18)

## 2022-12-20 LAB — CBC
HCT: 39.4 % (ref 39.0–52.0)
Hemoglobin: 13 g/dL (ref 13.0–17.0)
MCH: 30.6 pg (ref 26.0–34.0)
MCHC: 33 g/dL (ref 30.0–36.0)
MCV: 92.7 fL (ref 80.0–100.0)
Platelets: 214 10*3/uL (ref 150–400)
RBC: 4.25 MIL/uL (ref 4.22–5.81)
RDW: 13 % (ref 11.5–15.5)
WBC: 5.2 10*3/uL (ref 4.0–10.5)
nRBC: 0 % (ref 0.0–0.2)

## 2022-12-20 LAB — LIPASE, BLOOD: Lipase: 20 U/L (ref 11–51)

## 2022-12-20 MED ORDER — POTASSIUM CHLORIDE CRYS ER 20 MEQ PO TBCR
40.0000 meq | EXTENDED_RELEASE_TABLET | ORAL | Status: AC
  Administered 2022-12-20: 40 meq via ORAL
  Filled 2022-12-20: qty 2

## 2022-12-20 MED ORDER — AMLODIPINE BESYLATE 5 MG PO TABS: 5.0000 mg | ORAL_TABLET | Freq: Every day | ORAL | 2 refills | Status: DC

## 2022-12-20 MED ORDER — LACTATED RINGERS IV BOLUS
500.0000 mL | Freq: Once | INTRAVENOUS | Status: AC
  Administered 2022-12-20: 500 mL via INTRAVENOUS

## 2022-12-20 NOTE — ED Provider Notes (Signed)
    ED Course / MDM   Clinical Course as of 12/20/22 1637  Thu Dec 20, 2022  1543 Received sign out from Dr. Eloise Harman, presenting with lightheadedness x1 week, chest pain for 1 day. ECG shows sinus bradycardia. Able to ambulate. Pending repeat troponin. Initially minimally elevated, if stable likely d/c.  [WS]  1633 Delta troponin stable.  Minimal troponin elevated possibly due to his high blood pressure.  Patient also further reports that his symptoms began after eating a [employer] hamburger, seems to be in his lower chest on the left side and have been improving.  He is not having any other symptoms like nausea or vomiting, syncope, back pain, shortness of breath.  His labs do show mild hypernatremia which suggests some dehydration in terms of water intake.  He reports that he has been drinking 3 diet Mountain Dew's a day and not drinking much water.  Advised increased water intake decrease diet soda intake.  He reports that he already has close follow-up with cardiology and his primary doctor.  Previous provider already prescribed amlodipine for his high blood pressure.  Overall concern for ACS is very low.  Discussed with the patient.  He feels comfortable with discharge and would prefer discharge.  Discussed strict return precautions for any new or worsening symptoms. Will discharge patient to home. All questions answered. Patient comfortable with plan of discharge. Return precautions discussed with patient and specified on the after visit summary.  [WS]    Clinical Course User Index [WS] Lonell Grandchild, MD   Medical Decision Making Amount and/or Complexity of Data Reviewed Labs: ordered. Radiology: ordered.  Risk Prescription drug management.         Lonell Grandchild, MD 12/20/22 [phone]

## 2022-12-20 NOTE — Discharge Instructions (Addendum)
You were seen for your chest pain in the emergency department.   At home, please start taking the amlodipine for your blood pressure.  Please also try to drink more water and drink less diet soda.  Your laboratory test showed that you may have some mild dehydration related to not drinking enough water.  This could be contributing to your headache.  Follow-up with your primary doctor in 2-3 days regarding your visit.  Cardiology will be calling you regarding an appointment within the next 72 hours.  You may contact them if you do not hear from them in that time using the information in this packet.  Return immediately to the emergency department if you experience any of the following: Worsening pain, difficulty breathing, unexplained vomiting or sweating, or any other concerning symptoms.    Thank you for visiting our Emergency Department. It was a pleasure taking care of you today.

## 2022-12-20 NOTE — ED Provider Notes (Signed)
[place] EMERGENCY DEPARTMENT AT [facility] Provider Note   CSN: [id number] Arrival date & time: 12/20/22  1230     History  Chief Complaint  Patient presents with   Chest Pain    Ronald [name] is a 82 y.o. male.  82 year old male with history of hypertension, CAD status post PCI, hyperlipidemia, diabetes, atrial flutter status post cardioversion and ablation on Eliquis, and TAA who presents emergency department with dizziness and chest pain.  Patient reports that for the past week he has had lightheaded sensation when standing up and with walking.  Also says he has felt fatigued and occasionally feels off balance.  Has had an occipital headache that is worse at night and resolves during the day which is new.  Denies any double vision, blurry vision, numbness or weakness of his arms or legs.  Yesterday started having chest discomfort after eating a hamburger.  Described it as substernal and felt like reflux.  Not exertional no diaphoresis or vomiting.  Says that he still does have some chest discomfort at this time but is very mild.  Says it did improve with some Mylanta that he took.  Called his primary care office about his symptoms and told them that his blood pressure was elevated and they referred him to the emergency department for evaluation.  Takes losartan and chlorthalidone for his blood pressure.       Home Medications Prior to Admission medications   Medication Sig Start Date End Date Taking? Authorizing Provider  amLODipine (NORVASC) 5 MG tablet Take 1 tablet (5 mg total) by mouth daily. 12/20/22  Yes Rondel Baton, MD  amiodarone (PACERONE) 200 MG tablet TAKE 1 TABLET BY MOUTH TWICE A DAY 12/05/21   Kathleene Hazel, MD  apixaban (ELIQUIS) 5 MG TABS tablet Take 1 tablet (5 mg total) by mouth 2 (two) times daily. 12/03/22   Sharlene Dory, PA-C  blood glucose meter kit and supplies KIT Dispense based on patient and insurance preference. Use up to four  times daily as directed. 10/31/21   Cheryll Cockayne, MD  chlorthalidone (HYGROTON) 25 MG tablet Take 12.5 mg by mouth every morning. 09/05/21   [provider]  clobetasol ointment (TEMOVATE) 0.05 % Apply 1 application  topically 2 (two) times daily as needed (itchy scaly on legs).    [provider]  Coenzyme Q10 (COQ-10) 200 MG CAPS Take 200 mg by mouth every morning.    [provider]  dicyclomine (BENTYL) 10 MG capsule 1 capsule 30 minutes before eating Orally Three times a day as needed 12/04/21   [provider]  famotidine (PEPCID) 40 MG tablet Take 40 mg by mouth 2 (two) times daily. 03/07/22   [provider]  fluticasone (FLONASE) 50 MCG/ACT nasal spray Place 1 spray into both nostrils daily as needed for allergies (sinus congestion).    [provider]  gabapentin (NEURONTIN) 300 MG capsule 1 capsule Oral Once a day in evening    [provider]  Glucosamine HCl 1500 MG TABS Take 3,000 mg by mouth daily.    [provider]  hydrocortisone 2.5 % cream Apply 1 application  topically as needed (eye brows flakey). Use on head 07/09/14   [provider]  Ivermectin (SOOLANTRA EX) Apply 1 application  topically daily as needed (Rosacea).    [provider]  ketoconazole (NIZORAL) 2 % cream Apply 1 application  topically daily as needed for irritation (Corner of mouth (sores)).  [provider]  LINZESS 145 MCG CAPS capsule Take 145 mcg by mouth daily.    [provider]  LIVALO 4 MG TABS Take 2 mg by mouth at bedtime.  02/24/19   [provider]  losartan (COZAAR) 100 MG tablet Take 100 mg by mouth daily. 11/19/22   [provider]  Multiple Vitamin (MULTIVITAMIN WITH MINERALS) TABS tablet Take 1 tablet by mouth daily.     [provider]  mupirocin ointment (BACTROBAN) 2 % Apply 1 application  topically daily as needed (tick bites).    [provider]   nitroGLYCERIN (NITROSTAT) 0.4 MG SL tablet Place 1 tablet (0.4 mg total) under the tongue every 5 (five) minutes as needed for chest pain. 12/03/22   Sharlene Dory, PA-C  sucralfate (CARAFATE) 1 g tablet Take 1 g by mouth 3 (three) times daily.    [provider]      Allergies    Promethazine hcl, Vesicare [solifenacin succinate], and Atorvastatin    Review of Systems   Review of Systems  Physical Exam Updated Vital Signs BP (!) 180/80   Pulse (!) 47   Temp 97.9 F (36.6 C) (Oral)   Resp 14   Ht 6\' 7"  (2.007 m)   Wt 113.9 kg   SpO2 98%   BMI 28.28 kg/m  Physical Exam Vitals and nursing note reviewed.  Constitutional:      General: He is not in acute distress.    Appearance: He is well-developed.  HENT:     Head: Normocephalic and atraumatic.     Right Ear: External ear normal.     Left Ear: External ear normal.     Nose: Nose normal.  Eyes:     Extraocular Movements: Extraocular movements intact.     Conjunctiva/sclera: Conjunctivae normal.     Pupils: Pupils are equal, round, and reactive to light.  Cardiovascular:     Rate and Rhythm: Regular rhythm. Bradycardia present.     Heart sounds: Normal heart sounds.     Comments: Chest pain not reproducible Pulmonary:     Effort: Pulmonary effort is normal. No respiratory distress.     Breath sounds: Normal breath sounds.  Abdominal:     General: There is no distension.     Palpations: Abdomen is soft. There is no mass.     Tenderness: There is no abdominal tenderness. There is no guarding.  Musculoskeletal:     Cervical back: Normal range of motion and neck supple.     Right lower leg: Edema (1+) present.     Left lower leg: Edema (1+) present.  Skin:    General: Skin is warm and dry.  Neurological:     Mental Status: He is alert. Mental status is at baseline.     Comments: MENTAL STATUS: AAOx3 CRANIAL NERVES: II: Pupils equal and reactive 4 mm BL, no RAPD, no VF deficits III, IV, VI: EOM intact, no  gaze preference or deviation, no nystagmus. V: normal sensation to light touch in V1, V2, and V3 segments bilaterally VII: no facial weakness or asymmetry, no nasolabial fold flattening VIII: normal hearing to speech and finger friction IX, X: normal palatal elevation, no uvular deviation XI: 5/5 head turn and 5/5 shoulder shrug bilaterally XII: midline tongue protrusion MOTOR: 5/5 strength in R shoulder flexion, elbow flexion and extension, and grip strength. 5/5 strength in L shoulder flexion, elbow flexion and extension, and grip strength.  5/5 strength in R hip and knee flexion,  knee extension, ankle plantar and dorsiflexion. 5/5 strength in L hip and knee flexion, knee extension, ankle plantar and dorsiflexion. SENSORY: Normal sensation to light touch in all extremities COORD: Normal finger to nose and heel to shin, no tremor, no dysmetria STATION: normal stance, no truncal ataxia GAIT: Normal   Psychiatric:        Mood and Affect: Mood normal.        Behavior: Behavior normal.     ED Results / Procedures / Treatments   Labs (all labs ordered are listed, but only abnormal results are displayed) Labs Reviewed  BASIC METABOLIC PANEL - Abnormal; Notable for the following components:      Result Value   Sodium 147 (*)    Potassium 3.1 (*)    Glucose, Bld 124 (*)    BUN 24 (*)    Calcium 8.4 (*)    All other components within normal limits  TROPONIN I (HIGH SENSITIVITY) - Abnormal; Notable for the following components:   Troponin I (High Sensitivity) 19 (*)    All other components within normal limits  TROPONIN I (HIGH SENSITIVITY) - Abnormal; Notable for the following components:   Troponin I (High Sensitivity) 21 (*)    All other components within normal limits  CBC  LIPASE, BLOOD    EKG EKG Interpretation Date/Time:  Thursday December 20 2022 12:38:38 EDT Ventricular Rate:  53 PR Interval:  198 QRS Duration:  140 QT Interval:  525 QTC Calculation: 493 R  Axis:   -68  Text Interpretation: Sinus rhythm RBBB and LAFB Confirmed by Vonita Moss [phone]) on 12/20/2022 1:24:43 PM  Radiology DG Chest 2 View  Result Date: 12/20/2022 CLINICAL DATA:  Provided history: Chest pain. EXAM: CHEST - 2 VIEW COMPARISON:  Chest CT 03/26/2022. Prior chest radiographs 02/07/2021 and earlier. FINDINGS: The cardiomediastinal silhouette is unchanged. Small linear opacities within the left lung base most consistent with atelectasis and/or scarring. No appreciable airspace consolidation. No evidence of pleural effusion or pneumothorax. No acute osseous abnormality identified. Degenerative changes of the spine. Cervical fixation hardware. IMPRESSION: 1. No appreciable airspace consolidation or pulmonary edema. 2. Small linear opacities within the lung bases most consistent with atelectasis and/or scarring. Electronically Signed   By: Jackey Loge D.O.   On: 12/20/2022 15:04   CT Head Wo Contrast  Result Date: 12/20/2022 CLINICAL DATA:  Occipital headache, lightheadedness, hypertension EXAM: CT HEAD WITHOUT CONTRAST TECHNIQUE: Contiguous axial images were obtained from the base of the skull through the vertex without intravenous contrast. RADIATION DOSE REDUCTION: This exam was performed according to the departmental dose-optimization program which includes automated exposure control, adjustment of the mA and/or kV according to patient size and/or use of iterative reconstruction technique. COMPARISON:  04/16/2022 FINDINGS: Brain: No evidence of acute infarction, hemorrhage, mass, mass effect, or midline shift. No hydrocephalus or extra-axial fluid collection. Cerebral volume is better than expected for age. Vascular: No hyperdense vessel. Skull: Negative for fracture or focal lesion. Sinuses/Orbits: No acute finding. Status post bilateral lens replacements. Other: The mastoid air cells are well aerated. IMPRESSION: No acute intracranial process. Electronically Signed   By: Wiliam Ke M.D.   On: 12/20/2022 14:46    Procedures Procedures    Medications Ordered in ED Medications  lactated ringers bolus 500 mL (0 mLs Intravenous Stopped 12/20/22 1514)  potassium chloride SA (KLOR-CON M) CR tablet 40 mEq (40 mEq Oral Given 12/20/22 1415)    ED Course/ Medical Decision Making/ A&P Clinical Course as of 12/20/22 1917  Thu Dec 20, 2022  1543 Received sign out from Dr. Eloise Harman, presenting with lightheadedness x1 week, chest pain for 1 day. ECG shows sinus bradycardia. Able to ambulate. Pending repeat troponin. Initially minimally elevated, if stable likely d/c.  [WS]  1633 Delta troponin stable.  Minimal troponin elevated possibly due to his high blood pressure.  Patient also further reports that his symptoms began after eating a [employer] hamburger, seems to be in his lower chest on the left side and have been improving.  He is not having any other symptoms like nausea or vomiting, syncope, back pain, shortness of breath.  His labs do show mild hypernatremia which suggests some dehydration in terms of water intake.  He reports that he has been drinking 3 diet Mountain Dew's a day and not drinking much water.  Advised increased water intake decrease diet soda intake.  He reports that he already has close follow-up with cardiology and his primary doctor.  Previous provider already prescribed amlodipine for his high blood pressure.  Overall concern for ACS is very low.  Discussed with the patient.  He feels comfortable with discharge and would prefer discharge.  Discussed strict return precautions for any new or worsening symptoms. Will discharge patient to home. All questions answered. Patient comfortable with plan of discharge. Return precautions discussed with patient and specified on the after visit summary.  [WS]    Clinical Course User Index [WS] Lonell Grandchild, MD                                 Medical Decision Making Amount and/or Complexity of Data  Reviewed Labs: ordered. Radiology: ordered.  Risk Prescription drug management.   KILAN SUPAK is a 82 y.o. male with comorbidities that complicate the patient evaluation including hypertension, CAD status post PCI, hyperlipidemia, diabetes, atrial flutter status post cardioversion and ablation on Eliquis, and TAA who presents emergency department with dizziness and chest pain.    Initial Ddx:  MI, arrhythmia, orthostasis, dehydration, stroke, mass, hypertensive emergency  MDM/Course:  Patient presents to the emergency department with a week of lightheadedness, headache, and a day of chest discomfort in the setting of elevated blood pressure.  Does not have any focal neurologic deficits on his exam or cerebellar signs.  Is able to walk without assistance so feel that stroke is highly unlikely.  Given his age did obtain a head CT to rule out any mass or anything else that would be causing his symptoms which did not show any evidence of acute intracranial abnormality.  Chest x-ray without any abnormalities.  EKG did not show any new ischemic changes.  Initial troponin was marginally elevated at 19.  Initial blood pressure was nearly 180 systolic but repeat without any intervention was in the 150s.  Will prescribe him some amlodipine for his resistant hypertension.  Also gave him some fluids in case he is having some orthostasis.  Was notably bradycardic but this appears to be chronic and has been in the 40s and 50s on his last several visits.  So I feel that this is unlikely the etiology of his symptoms.  Upon re-evaluation patient remained stable.  He was signed out to the oncoming physician (Dr Suezanne Jacquet) awaiting repeat troponin.  This patient presents to the ED for concern of complaints listed in HPI, this involves an extensive number of treatment options, and is a complaint that carries with it a high risk of  complications and morbidity. Disposition including potential need for admission  considered.   Dispo: Pending remainder of workup  Additional history obtained from spouse Records reviewed Outpatient Clinic Notes The following labs were independently interpreted: Chemistry and show  hyponatremia I independently reviewed the following imaging with scope of interpretation limited to determining acute life threatening conditions related to emergency care: CT Head and agree with the radiologist interpretation with the following exceptions: none I personally reviewed and interpreted cardiac monitoring: sinus bradycardia I personally reviewed and interpreted the pt's EKG: see above for interpretation  I have reviewed the patients home medications and made adjustments as needed Social Determinants of health:  Elderly         Final Clinical Impression(s) / ED Diagnoses Final diagnoses:  Chest pain, unspecified type  Bradycardia  Resistant hypertension    Rx / DC Orders ED Discharge Orders          Ordered    amLODipine (NORVASC) 5 MG tablet  Daily        12/20/22 1519    Ambulatory referral to Cardiology        12/20/22 1519              Rondel Baton, MD 12/20/22 [phone]

## 2022-12-20 NOTE — ED Triage Notes (Signed)
Pt presents with CP described as an ache to L lower chest that was non-radiating that started yesterday. Pt reports associated increase in BP and lightheadedness. Pt was able to walk for a mile last PM, but pt states he has been unable to walk a straight line, "like I am drunk, " for a week. Pt states pain is better today.

## 2022-12-24 ENCOUNTER — Ambulatory Visit (HOSPITAL_COMMUNITY): Payer: Medicare PPO | Attending: Physician Assistant

## 2022-12-24 DIAGNOSIS — R7989 Other specified abnormal findings of blood chemistry: Secondary | ICD-10-CM | POA: Diagnosis not present

## 2022-12-24 DIAGNOSIS — I2510 Atherosclerotic heart disease of native coronary artery without angina pectoris: Secondary | ICD-10-CM | POA: Insufficient documentation

## 2022-12-24 DIAGNOSIS — I77810 Thoracic aortic ectasia: Secondary | ICD-10-CM | POA: Insufficient documentation

## 2022-12-24 DIAGNOSIS — I351 Nonrheumatic aortic (valve) insufficiency: Secondary | ICD-10-CM

## 2022-12-24 DIAGNOSIS — I10 Essential (primary) hypertension: Secondary | ICD-10-CM | POA: Insufficient documentation

## 2022-12-24 DIAGNOSIS — E785 Hyperlipidemia, unspecified: Secondary | ICD-10-CM | POA: Insufficient documentation

## 2022-12-24 DIAGNOSIS — R42 Dizziness and giddiness: Secondary | ICD-10-CM | POA: Diagnosis not present

## 2022-12-24 DIAGNOSIS — E876 Hypokalemia: Secondary | ICD-10-CM | POA: Diagnosis not present

## 2022-12-24 DIAGNOSIS — I129 Hypertensive chronic kidney disease with stage 1 through stage 4 chronic kidney disease, or unspecified chronic kidney disease: Secondary | ICD-10-CM | POA: Diagnosis not present

## 2022-12-24 DIAGNOSIS — R000 Tachycardia, unspecified: Secondary | ICD-10-CM | POA: Insufficient documentation

## 2022-12-24 DIAGNOSIS — E870 Hyperosmolality and hypernatremia: Secondary | ICD-10-CM | POA: Diagnosis not present

## 2022-12-24 DIAGNOSIS — R001 Bradycardia, unspecified: Secondary | ICD-10-CM | POA: Diagnosis not present

## 2022-12-24 DIAGNOSIS — I4891 Unspecified atrial fibrillation: Secondary | ICD-10-CM | POA: Diagnosis not present

## 2022-12-24 DIAGNOSIS — I080 Rheumatic disorders of both mitral and aortic valves: Secondary | ICD-10-CM | POA: Insufficient documentation

## 2022-12-24 LAB — ECHOCARDIOGRAM COMPLETE
Area-P 1/2: 3.65 cm2
P 1/2 time: 400 ms
S' Lateral: 4.4 cm

## 2022-12-27 DIAGNOSIS — D485 Neoplasm of uncertain behavior of skin: Secondary | ICD-10-CM | POA: Diagnosis not present

## 2022-12-27 DIAGNOSIS — D171 Benign lipomatous neoplasm of skin and subcutaneous tissue of trunk: Secondary | ICD-10-CM | POA: Diagnosis not present

## 2023-01-18 ENCOUNTER — Telehealth: Payer: Self-pay

## 2023-01-18 NOTE — Telephone Encounter (Signed)
Transition Care Management Unsuccessful Follow-up Telephone Call  Date of discharge and from where:  12/20/2022 [facility]  Attempts:  1st Attempt  Reason for unsuccessful TCM follow-up call:  Left voice message  Zaccheus Edmister [facility]  [facility], [facility] Guide Direct Dial: [phone]  Website: [email or web address]

## 2023-01-21 ENCOUNTER — Telehealth: Payer: Self-pay

## 2023-01-21 NOTE — Telephone Encounter (Signed)
Transition Care Management Follow-up Telephone Call Date of discharge and from where: 12/20/2022 [facility] How have you been since you were released from the hospital? Patient stated he is feeling much better. Any questions or concerns? No  Items Reviewed: Did the pt receive and understand the discharge instructions provided? Yes  Medications obtained and verified? Yes  Other? No  Any new allergies since your discharge? No  Dietary orders reviewed? Yes Do you have support at home? Yes   Follow up appointments reviewed:  PCP Hospital f/u appt confirmed? Yes  Scheduled to see [name], MD on 12/25/2022 @ [facility] Specialist Hospital f/u appt confirmed? Yes  Scheduled to see Dayna N. Shea Evans, PA-C on 03/11/2023 @ [facility]. Are transportation arrangements needed? No  If their condition worsens, is the pt aware to call PCP or go to the Emergency Dept.? Yes Was the patient provided with contact information for the PCP's office or ED? Yes Was to pt encouraged to call back with questions or concerns? Yes   Ancelmo Hunt [facility]  [facility], [facility] Guide Direct Dial: [phone]  Website: [email or web address]

## 2023-01-30 DIAGNOSIS — R29818 Other symptoms and signs involving the nervous system: Secondary | ICD-10-CM | POA: Diagnosis not present

## 2023-01-30 DIAGNOSIS — I4891 Unspecified atrial fibrillation: Secondary | ICD-10-CM | POA: Diagnosis not present

## 2023-01-30 DIAGNOSIS — I129 Hypertensive chronic kidney disease with stage 1 through stage 4 chronic kidney disease, or unspecified chronic kidney disease: Secondary | ICD-10-CM | POA: Diagnosis not present

## 2023-01-31 ENCOUNTER — Telehealth: Payer: Self-pay | Admitting: Cardiovascular Disease

## 2023-01-31 NOTE — Telephone Encounter (Signed)
Pt is requesting a callback regarding his face heating up at night but he's not sweating so he can't get any sleep. This has been going on for about 2 to 3 months, it started when he would just lay down but now it's beginning to be an all day thing. Please advise

## 2023-01-31 NOTE — Telephone Encounter (Signed)
Called pt in regards to facial symptoms at night.  Reports faces gets really hot mainly at night.  Has been occurring for 2-3 months.  It keeps him up at night.  Reports mentioned to PCP who said it could be testosterone related then said for pt to check with cardiology could be r/t amiodarone.   Pt reports has checked temperature no fever noted.  Uses cold rags to help some.   Asked pt if PCP checked testosterone level pt is unsure.  Wants to know if facial hotness could be r/t amiodarone.  Pt has been on amiodarone for over 5 yrs per pt.   Advised will send to pharmacy to review.

## 2023-02-01 NOTE — Telephone Encounter (Signed)
Amiodarone is known to cause photosensitivity however this is typically seen during exposure to light. Odd that it is happening only at night.  "Dermatologic: Photosensitivity has been reported and may be reduced with sun-barrier creams or protective clothing. Blue-gray skin discoloration may occur with prolonged use; some reversal of discoloration may occur upon discontinuation."  Suggest reaching back to out to PCP to check testosterone levels

## 2023-02-01 NOTE — Telephone Encounter (Signed)
Informed patient.  He said he is burning up right now.  Denies any new medications.  No sweating, just hot.  He gets so hot at night he can't sleep.  It has continued to get more frequent to where now it is almost constant.  He is aware I will forward to his PCP and also to Dr. Clifton Dempsy as Lorain Childes.

## 2023-02-06 DIAGNOSIS — Z87891 Personal history of nicotine dependence: Secondary | ICD-10-CM | POA: Diagnosis not present

## 2023-02-06 DIAGNOSIS — J309 Allergic rhinitis, unspecified: Secondary | ICD-10-CM | POA: Diagnosis not present

## 2023-02-06 DIAGNOSIS — Z7901 Long term (current) use of anticoagulants: Secondary | ICD-10-CM | POA: Diagnosis not present

## 2023-02-06 DIAGNOSIS — H9192 Unspecified hearing loss, left ear: Secondary | ICD-10-CM | POA: Diagnosis not present

## 2023-02-06 DIAGNOSIS — M1990 Unspecified osteoarthritis, unspecified site: Secondary | ICD-10-CM | POA: Diagnosis not present

## 2023-02-06 DIAGNOSIS — Z809 Family history of malignant neoplasm, unspecified: Secondary | ICD-10-CM | POA: Diagnosis not present

## 2023-02-06 DIAGNOSIS — I129 Hypertensive chronic kidney disease with stage 1 through stage 4 chronic kidney disease, or unspecified chronic kidney disease: Secondary | ICD-10-CM | POA: Diagnosis not present

## 2023-02-06 DIAGNOSIS — I2510 Atherosclerotic heart disease of native coronary artery without angina pectoris: Secondary | ICD-10-CM | POA: Diagnosis not present

## 2023-02-06 DIAGNOSIS — N529 Male erectile dysfunction, unspecified: Secondary | ICD-10-CM | POA: Diagnosis not present

## 2023-02-06 DIAGNOSIS — K219 Gastro-esophageal reflux disease without esophagitis: Secondary | ICD-10-CM | POA: Diagnosis not present

## 2023-02-06 DIAGNOSIS — I739 Peripheral vascular disease, unspecified: Secondary | ICD-10-CM | POA: Diagnosis not present

## 2023-02-06 DIAGNOSIS — K589 Irritable bowel syndrome without diarrhea: Secondary | ICD-10-CM | POA: Diagnosis not present

## 2023-02-06 DIAGNOSIS — E785 Hyperlipidemia, unspecified: Secondary | ICD-10-CM | POA: Diagnosis not present

## 2023-02-06 DIAGNOSIS — Z8249 Family history of ischemic heart disease and other diseases of the circulatory system: Secondary | ICD-10-CM | POA: Diagnosis not present

## 2023-02-06 DIAGNOSIS — R269 Unspecified abnormalities of gait and mobility: Secondary | ICD-10-CM | POA: Diagnosis not present

## 2023-02-18 ENCOUNTER — Other Ambulatory Visit: Payer: Self-pay | Admitting: Thoracic Surgery (Cardiothoracic Vascular Surgery)

## 2023-02-18 DIAGNOSIS — I7121 Aneurysm of the ascending aorta, without rupture: Secondary | ICD-10-CM

## 2023-02-25 ENCOUNTER — Encounter: Payer: Self-pay | Admitting: Thoracic Surgery (Cardiothoracic Vascular Surgery)

## 2023-03-10 ENCOUNTER — Encounter: Payer: Self-pay | Admitting: Physician Assistant

## 2023-03-10 NOTE — Progress Notes (Unsigned)
Cardiology Office Note    Date:  03/11/2023  ID:  Ronald [name], DOB 10-Aug-1940, MRN [id number] PCP:  [name], MD  Cardiologist:  Verne Carrow, MD  Electrophysiologist:  Lanier Prude, MD   Chief Complaint: ER follow-up  History of Present Illness: .    Ronald [name] is a 82 y.o. 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  His last PCI was in 2004 to RCA. Last cath 2013 showed 60% small caliber PDA, mild plaque in RCA, patent RCA stent otherwise no significant disease. Last nuc 2022 was low risk. ABIs 05/2022 done by [facility] for foot drop showed noncompressible LLE arteries, with normal TBI and normal RLE; symptoms not felt related to arterial insufficiency. He also has history of persistent AF/AFL with prior ablation of Afib in 2021, then developed AFL in 2022 requiring subsequent DCCV and has been treated with amiodarone. Last echo 12/2022 showed EF 60-65%, G2DD, mod RV enlargement mild MR, moderate AI, severe dilation of aorta. He also follows with Dr. Leafy Ro for TAA and has a f/u CT pending - noting previous study also captured some pulmonary nodules. He had ED visit 12/2022 for lightheadedness and chest pain that started after eating a hamburger, improved with Mylanta. Labs showed hypokalemia, hypernatremia, hsTrops 19-21, workup felt reassuring, possibly related to elevated BP. In 2023 he reports he had significant weight loss following stomach issues and had "every test known to man" but no cause was found. He eventually spontaneously got his strength and began building  back his weight and reports he's felt much better at the end of 2024. He has continued to have issues with facial flushing at night over the last 4-5 months. This past weekend he skipped his amiodarone dose Saturday, and decreased to once daily on Sunday, and flushing has improved for the first time ever. He denies any recurrent chest pain. He has not had any dyspnea on exertion. He was walking 1 mile regularly up until about 2 weeks ago when the holidays started and he got busy. However, he has remained physically active and helped his son move a few days ago. He had no cardiac symptoms with this. He states he feels "great."  Labwork independently reviewed: 12/2022 trops 19-21, lipase wnl, Na 147, K 3.1, BUN 24, Cr 1.12, CBC wnl. KPN 12/24/22 K 3.6 06/2022 TSH OK  ROS: .    Please see the history of present illness.  All other systems are reviewed and otherwise negative.  Studies Reviewed: Marland Kitchen    EKG:  EKG is ordered today, personally reviewed, demonstrating sinus bradycardia 54bpm, first degree AVB, NSIVCD c/w known RBBB, nonspecific ST changes, cannot exclude prior anterior infarct. Overall stable from prior.  CV Studies: Cardiac studies reviewed are outlined and summarized above. Otherwise please see EMR for full report.   Current Reported Medications:.    Current Meds  Medication Sig   amiodarone (PACERONE) 200 MG tablet TAKE 1 TABLET BY MOUTH TWICE A DAY   amLODipine (NORVASC) 5 MG tablet Take 1 tablet (5 mg total) by mouth daily.   apixaban (ELIQUIS) 5 MG TABS tablet Take  1 tablet (5 mg total) by mouth 2 (two) times daily.   blood glucose meter kit and supplies KIT Dispense based on patient and insurance preference. Use up to four times daily as directed.   chlorthalidone (HYGROTON) 25 MG tablet Take 12.5 mg by mouth every morning.   clobetasol ointment (TEMOVATE) 0.05 % Apply 1 application  topically 2 (two) times daily as needed (itchy scaly on legs).   Coenzyme Q10 (COQ-10) 200 MG  CAPS Take 200 mg by mouth every morning.   dicyclomine (BENTYL) 10 MG capsule 1 capsule 30 minutes before eating Orally Three times a day as needed   famotidine (PEPCID) 40 MG tablet Take 40 mg by mouth 2 (two) times daily.   fluticasone (FLONASE) 50 MCG/ACT nasal spray Place 1 spray into both nostrils daily as needed for allergies (sinus congestion).   gabapentin (NEURONTIN) 300 MG capsule 1 capsule Oral Once a day in evening   Glucosamine HCl 1500 MG TABS Take 3,000 mg by mouth daily.   hydrocortisone 2.5 % cream Apply 1 application  topically as needed (eye brows flakey). Use on head   Ivermectin (SOOLANTRA EX) Apply 1 application  topically daily as needed (Rosacea).   ketoconazole (NIZORAL) 2 % cream Apply 1 application  topically daily as needed for irritation (Corner of mouth (sores)).   LINZESS 145 MCG CAPS capsule Take 145 mcg by mouth daily.   LIVALO 4 MG TABS Take 2 mg by mouth at bedtime.    losartan (COZAAR) 100 MG tablet Take 100 mg by mouth daily.   Multiple Vitamin (MULTIVITAMIN WITH MINERALS) TABS tablet Take 1 tablet by mouth daily.    mupirocin ointment (BACTROBAN) 2 % Apply 1 application  topically daily as needed (tick bites).   nitroGLYCERIN (NITROSTAT) 0.4 MG SL tablet Place 1 tablet (0.4 mg total) under the tongue every 5 (five) minutes as needed for chest pain.   sucralfate (CARAFATE) 1 g tablet Take 1 g by mouth 3 (three) times daily.    Physical Exam:    VS:  BP 120/72 (BP Location: Right Arm, Patient Position: Sitting, Cuff Size: Normal)   Pulse (!) 54   Resp 16   Ht 6\' 7"  (2.007 m)   Wt 255 lb (115.7 kg)   SpO2 95%   BMI 28.73 kg/m    Wt Readings from Last 3 Encounters:  03/11/23 255 lb (115.7 kg)  12/20/22 251 lb (113.9 kg)  12/03/22 259 lb 3.2 oz (117.6 kg)    GEN: Well nourished, well developed in no acute distress NECK: No JVD; No carotid bruits CARDIAC: RRR, no murmurs, rubs, gallops RESPIRATORY:  Clear to auscultation without rales, wheezing or  rhonchi  ABDOMEN: Soft, non-tender, non-distended EXTREMITIES:  No edema; No acute deformity   Asessement and Plan:.    1. CAD, HLD - seen in ED 12/2022 for atypical CP after eating a hamburger, trops low/flat in setting of HTN and electrolyte disturbance, not felt to reflect ACS. The symptoms sounded GI in nature. He has been able to exert himself since then without any angina or dyspnea. F/u echo showed preserved LVEF. Therefore it seems that continued medical management is appropriate, monitoring for any recurrence in symptoms. He is not on ASA given concomitant Eliquis. No BB given HR 50s at baseline. PCP is managing statin/lipids.  2. HTN - BP controlled on present regimen, no change in antihypertensive regimen.  3. Electrolyte disturbances - recheck labs today with CMET, Mg today.  4. Persistent atrial fib/flutter - patient  has been dealing with nocturnal facial flushing for months. This weekend he self-trialed a decrease in amiodarone with improvement for the first time in months. He does not wish to take this BID any longer. Will formally decrease amiodarone to 200mg  daily and arrange close f/u with EP (overdue anyway) to follow further. Will update TSH and LFTs in labs today along with CBC. Eliquis dose remains appropriate.  5. Mild MR, moderate AI, TAA - last echo 12/2022 demonstrated mild MR, moderate AI. I would expect the AI needing to be monitored again in 1 year but he is also under active surveillance for his thoracic aortic aneurysm by Dr. Leafy Ro. He has a CT scan and CT surgery follow-up later this month. Will await any input on CT surgery if findings warrant sooner eval of the aortic valve if he has reached a threshold of needing intervention for his TAA. We reviewed aneurysm precautions/guidelines today and outlined on AVS.   Disposition: F/u with EP next available (overdue), and Dr. Clifton Dawood in 6 months.  Signed, Laurann Montana, PA-C

## 2023-03-11 ENCOUNTER — Encounter: Payer: Self-pay | Admitting: Physician Assistant

## 2023-03-11 ENCOUNTER — Ambulatory Visit: Payer: Medicare PPO | Attending: Cardiology | Admitting: Physician Assistant

## 2023-03-11 VITALS — BP 120/72 | HR 54 | Resp 16 | Ht 79.0 in | Wt 255.0 lb

## 2023-03-11 DIAGNOSIS — I10 Essential (primary) hypertension: Secondary | ICD-10-CM | POA: Diagnosis not present

## 2023-03-11 DIAGNOSIS — I2510 Atherosclerotic heart disease of native coronary artery without angina pectoris: Secondary | ICD-10-CM

## 2023-03-11 DIAGNOSIS — I4892 Unspecified atrial flutter: Secondary | ICD-10-CM | POA: Diagnosis not present

## 2023-03-11 DIAGNOSIS — I4819 Other persistent atrial fibrillation: Secondary | ICD-10-CM

## 2023-03-11 DIAGNOSIS — I7121 Aneurysm of the ascending aorta, without rupture: Secondary | ICD-10-CM

## 2023-03-11 DIAGNOSIS — E785 Hyperlipidemia, unspecified: Secondary | ICD-10-CM | POA: Diagnosis not present

## 2023-03-11 DIAGNOSIS — I340 Nonrheumatic mitral (valve) insufficiency: Secondary | ICD-10-CM

## 2023-03-11 DIAGNOSIS — E878 Other disorders of electrolyte and fluid balance, not elsewhere classified: Secondary | ICD-10-CM | POA: Diagnosis not present

## 2023-03-11 DIAGNOSIS — I351 Nonrheumatic aortic (valve) insufficiency: Secondary | ICD-10-CM

## 2023-03-11 MED ORDER — AMIODARONE HCL 200 MG PO TABS: 200.0000 mg | ORAL_TABLET | Freq: Every day | ORAL | 1 refills | Status: AC

## 2023-03-11 NOTE — Patient Instructions (Addendum)
Medication Instructions:  DECREASE Amiodarone to 200mg  Take 1 tablet once a day  *If you need a refill on your cardiac medications before your next appointment, please call your pharmacy*   Lab Work: TODAY-CMET, CBC, MAG, TSH If you have labs (blood work) drawn today and your tests are completely normal, you will receive your results only by: MyChart Message (if you have MyChart) OR A paper copy in the mail If you have any lab test that is abnormal or we need to change your treatment, we will call you to review the results.   Testing/Procedures: NONE ORDERED   Follow-Up: At [facility], you and your health needs are our priority.  As part of our continuing mission to provide you with exceptional heart care, we have created designated Provider Care Teams.  These Care Teams include your primary Cardiologist (physician) and Advanced Practice Providers (APPs -  Physician Assistants and Nurse Practitioners) who all work together to provide you with the care you need, when you need it.  We recommend signing up for the patient portal called "MyChart".  Sign up information is provided on this After Visit Summary.  MyChart is used to connect with patients for Virtual Visits (Telemedicine).  Patients are able to view lab/test results, encounter notes, upcoming appointments, etc.  Non-urgent messages can be sent to your provider as well.   To learn more about what you can do with MyChart, go to [email or web address].    Your next appointment:   6 month(s)  Provider:   Verne Carrow, MD    NEEDS APPT WITH DR Lalla Brothers OR EP APP FIRST AVAILABLE  Other Instructions   Information About Your Aneurysm  One of your tests has shown an aneurysm of your aorta. The word "aneurysm" refers to a bulge in an artery (blood vessel). Mainstays of therapy for aneurysms include very good blood pressure control, healthy lifestyle, and avoiding tobacco products and street drugs. Research has  raised concern that antibiotics in the fluoroquinolone class could be associated with increased risk of having an aneurysm develop or tear. This includes medicines that end in "floxacin," like Cipro or Levaquin. Make sure to discuss this information with other healthcare providers if you require antibiotics.  Since aneurysms can run in families, you should discuss your diagnosis with first degree relatives as they may need to be screened for this. Regular mild-moderate physical exercise is important, but avoid heavy lifting/weight lifting over 30lbs, chopping wood, shoveling snow or digging heavy earth with a shovel. It is best to avoid activities that cause grunting or straining (medically referred to as a "Valsalva maneuver"). This happens when a person bears down against a closed throat to increase the strength of arm or abdominal muscles. There's often a tendency to do this when lifting heavy weights, doing sit-ups, push-ups or chin-ups, etc., but it may be harmful.  This is a finding being monitored by Dr. Leafy Ro. Most unruptured thoracic aortic aneurysms cause no symptoms, so they are often found during exams for other conditions. Contact a health care provider if you develop any discomfort in your upper back, neck, abdomen, trouble swallowing, cough or hoarseness, or unexplained weight loss. Get help right away if you develop severe pain in your upper back or abdomen that may move into your chest and arms, or any other concerning symptoms such as shortness of breath or fever.

## 2023-03-12 ENCOUNTER — Telehealth: Payer: Self-pay

## 2023-03-12 DIAGNOSIS — Z79899 Other long term (current) drug therapy: Secondary | ICD-10-CM

## 2023-03-12 DIAGNOSIS — I10 Essential (primary) hypertension: Secondary | ICD-10-CM

## 2023-03-12 LAB — COMPREHENSIVE METABOLIC PANEL
ALT: 20 [IU]/L (ref 0–44)
AST: 28 [IU]/L (ref 0–40)
Albumin: 4.3 g/dL (ref 3.8–4.8)
Alkaline Phosphatase: 87 [IU]/L (ref 44–121)
BUN/Creatinine Ratio: 17 (ref 10–24)
BUN: 23 mg/dL (ref 8–27)
Bilirubin Total: 0.5 mg/dL (ref 0.0–1.2)
CO2: 31 mmol/L — ABNORMAL HIGH (ref 20–29)
Calcium: 9 mg/dL (ref 8.6–10.2)
Chloride: 103 mmol/L (ref 96–106)
Creatinine, Ser: 1.35 mg/dL — ABNORMAL HIGH (ref 0.76–1.27)
Globulin, Total: 2.5 g/dL (ref 1.5–4.5)
Glucose: 126 mg/dL — ABNORMAL HIGH (ref 70–99)
Potassium: 3.3 mmol/L — ABNORMAL LOW (ref 3.5–5.2)
Sodium: 144 mmol/L (ref 134–144)
Total Protein: 6.8 g/dL (ref 6.0–8.5)
eGFR: 53 mL/min/{1.73_m2} — ABNORMAL LOW (ref >59)

## 2023-03-12 LAB — CBC
Hematocrit: 41.4 % (ref 37.5–51.0)
Hemoglobin: 13 g/dL (ref 13.0–17.7)
MCH: 29.1 pg (ref 26.6–33.0)
MCHC: 31.4 g/dL — ABNORMAL LOW (ref 31.5–35.7)
MCV: 93 fL (ref 79–97)
Platelets: 239 10*3/uL (ref 150–450)
RBC: 4.47 x10E6/uL (ref 4.14–5.80)
RDW: 13.4 % (ref 11.6–15.4)
WBC: 5 10*3/uL (ref 3.4–10.8)

## 2023-03-12 LAB — MAGNESIUM: Magnesium: 2.3 mg/dL (ref 1.6–2.3)

## 2023-03-12 LAB — TSH: TSH: 3.19 u[IU]/mL (ref 0.450–4.500)

## 2023-03-12 MED ORDER — POTASSIUM CHLORIDE CRYS ER 20 MEQ PO TBCR: 40.0000 meq | EXTENDED_RELEASE_TABLET | Freq: Every day | ORAL | 3 refills | Status: AC

## 2023-03-12 NOTE — Telephone Encounter (Signed)
-----   Message from Laurann Montana sent at 03/12/2023  9:26 AM EST ----- Please let patient know labs overall stable except kidney function slightly worsened and potassium remains low. However, looking back at previous kidney trends it looks like this level has been observed back several years ago so may reflect his overall baseline range. Given the recent issues with low potassium recommend starting KCL tablets taking 2 tablets once a day with recheck BMET 5-7 days. Thank you!

## 2023-03-12 NOTE — Telephone Encounter (Signed)
Discussed lab results with patient.  Per Dayna: Please let patient know labs overall stable except kidney function slightly worsened and potassium remains low. However, looking back at previous kidney trends it looks like this level has been observed back several years ago so may reflect his overall baseline range. Given the recent issues with low potassium recommend starting KCL tablets taking 2 tablets once a day with recheck BMET 5-7 days. Thank you!   Rx for potassium 20 meq 2 tablets daily sent to pharmacy. BMET ordered and released to [facility]. Patient will come in for repeat BMET on 03/19/23.  Patient expressed concern regarding kidney function and amiodarone use. Informed patient this is being monitored and will be followed up on with next lab drawn. Patient verbalized understanding of the above and expressed appreciation for call.

## 2023-03-14 ENCOUNTER — Ambulatory Visit: Payer: Medicare PPO | Admitting: Cardiology

## 2023-03-20 DIAGNOSIS — I10 Essential (primary) hypertension: Secondary | ICD-10-CM | POA: Diagnosis not present

## 2023-03-20 DIAGNOSIS — Z79899 Other long term (current) drug therapy: Secondary | ICD-10-CM | POA: Diagnosis not present

## 2023-03-20 NOTE — Progress Notes (Unsigned)
  Electrophysiology Office Note:   Date:  03/21/2023  ID:  Ronald [name], DOB 1940/10/29, MRN [id number]  Primary Cardiologist: Verne Carrow, MD Electrophysiologist: Lanier Prude, MD      History of Present Illness:   Ronald [name] is a 82 y.o. male with h/o CAD, HTN, HLD, SVT, Bells Palsy, AF, and TAA seen today for routine electrophysiology followup.   Since last being seen in our clinic the patient reports doing well overall. Had gen cards visit in the past two weeks and sees TCTS next week. Has had some "hot flushing" in his face and wonders if it's the amiodarone. Surveillance labs stable. Overall, he denies chest pain, palpitations, dyspnea, PND, orthopnea, nausea, vomiting, dizziness, syncope, edema, weight gain, or early satiety.   Review of systems complete and found to be negative unless listed in HPI.   EP Information / Studies Reviewed:    EKG is not ordered today. EKG from 03/11/2023 reviewed which showed sinus bradycardia 54 bpm       Echo 12/2022 LVEF 60-65%, grade 2 DD, normal RV, mild MR  Physical Exam:   VS:  BP 116/74   Pulse (!) 59   Ht 6\' 7"  (2.007 m)   Wt 260 lb 12.8 oz (118.3 kg)   SpO2 95%   BMI 29.38 kg/m    Wt Readings from Last 3 Encounters:  03/21/23 260 lb 12.8 oz (118.3 kg)  03/11/23 255 lb (115.7 kg)  12/20/22 251 lb (113.9 kg)     GEN: Well nourished, well developed in no acute distress NECK: No JVD; No carotid bruits CARDIAC: Regular rate and rhythm, no murmurs, rubs, gallops RESPIRATORY:  Clear to auscultation without rales, wheezing or rhonchi  ABDOMEN: Soft, non-tender, non-distended EXTREMITIES:  No edema; No deformity   ASSESSMENT AND PLAN:    Persistent AF/AFL Maintaining sinus on amiodarone Cont Eliquis 5 mg BID for CHA2DS2/VASc of at least 5. Surveillance labs stable earlier this month.  If he feels like he is having off target symptoms, would be OK to reduce further to 200 mg Mon-Friday only (skipping  Sat/Sun)  HTN Stable on current regimen   Hypokalemia BMET 12/11 stable with addition of K.    Follow up with Dr. Lalla Brothers in 12 months ( 6 months with gen cards)  Signed, Graciella Freer, PA-C

## 2023-03-21 ENCOUNTER — Encounter: Payer: Self-pay | Admitting: Student

## 2023-03-21 ENCOUNTER — Ambulatory Visit: Payer: Medicare PPO | Attending: Student | Admitting: Student

## 2023-03-21 ENCOUNTER — Telehealth: Payer: Self-pay

## 2023-03-21 VITALS — BP 116/74 | HR 59 | Ht 79.0 in | Wt 260.8 lb

## 2023-03-21 DIAGNOSIS — I4819 Other persistent atrial fibrillation: Secondary | ICD-10-CM | POA: Diagnosis not present

## 2023-03-21 DIAGNOSIS — I10 Essential (primary) hypertension: Secondary | ICD-10-CM

## 2023-03-21 DIAGNOSIS — E878 Other disorders of electrolyte and fluid balance, not elsewhere classified: Secondary | ICD-10-CM

## 2023-03-21 DIAGNOSIS — I4892 Unspecified atrial flutter: Secondary | ICD-10-CM | POA: Diagnosis not present

## 2023-03-21 LAB — BASIC METABOLIC PANEL
BUN/Creatinine Ratio: 20 (ref 10–24)
BUN: 27 mg/dL (ref 8–27)
CO2: 26 mmol/L (ref 20–29)
Calcium: 9.2 mg/dL (ref 8.6–10.2)
Chloride: 105 mmol/L (ref 96–106)
Creatinine, Ser: 1.32 mg/dL — ABNORMAL HIGH (ref 0.76–1.27)
Glucose: 105 mg/dL — ABNORMAL HIGH (ref 70–99)
Potassium: 4.2 mmol/L (ref 3.5–5.2)
Sodium: 144 mmol/L (ref 134–144)
eGFR: 55 mL/min/{1.73_m2} — ABNORMAL LOW (ref >59)

## 2023-03-21 NOTE — Patient Instructions (Signed)

## 2023-03-21 NOTE — Telephone Encounter (Signed)
-----   Message from Laurann Montana sent at 03/21/2023  7:54 AM EST ----- Please let pt know labs look great. Potassium now at goal with potassium supplementation, would continue. Kidney function is abnormal but stable. Thank you!

## 2023-03-21 NOTE — Telephone Encounter (Signed)
Left voicemail to return call to office.

## 2023-03-27 NOTE — Progress Notes (Unsigned)
[address]       [place] [address]             [phone]           Ronald [name] [name] Medical Record #[id number] Date of Birth: April 23, 1940  Ronald Breeding, MD  Chief Complaint:   follow up ascending aortic aneurysm  History of Present Illness:     Pt is an 82 yo male with a recent CT scan to follow his ascending aortic aneurysm. Pt has no CP. Pt with a stable 5 cm aneurysm on todays CT. He however is still having flushing symptoms at night. Says he feels it is the amiodarone. 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    Lung nodules    noted on past CT scans   Malaria    Paroxysmal atrial flutter (HCC)    Persistent atrial fibrillation (HCC)    SVT (supraventricular tachycardia) (HCC)    remote episode during exercise test   Thoracic aortic aneurysm [facility])    last scan in Jan 2013. Now measuring 5.0cm. Referred to Dr. [name]   Wears dentures    bottom   Wears glasses     Past Surgical History:  Procedure Laterality Date   ATRIAL FIBRILLATION ABLATION N/A 05/12/2019   Procedure: ATRIAL FIBRILLATION ABLATION;  Surgeon: Ronald Range, MD;  Location: [facility] INVASIVE CV LAB;  Service: Cardiovascular;  Laterality: N/A;   CARDIOVERSION N/A 03/13/2019   Procedure: CARDIOVERSION;  Surgeon: Ronald Riffle, MD;  Location: [name] ENDOSCOPY;  Service: Cardiovascular;  Laterality: N/A;   CARDIOVERSION N/A 04/15/2019   Procedure: CARDIOVERSION;  Surgeon: Ronald Mixer, MD;  Location: [facility] ENDOSCOPY;   Service: Cardiovascular;  Laterality: N/A;   CARDIOVERSION N/A 06/16/2019   Procedure: CARDIOVERSION;  Surgeon: Ronald Si, MD;  Location: [facility] ENDOSCOPY;  Service: Cardiovascular;  Laterality: N/A;   CERVICAL FUSION  03-2010   c2-c7   COLONOSCOPY     x4   CORONARY STENT PLACEMENT  2004   dental implant     x 2   ELBOW SURGERY  1997   lt and rt   EYE SURGERY     HIP PINNING,CANNULATED Right 05/11/2014   Procedure: CANNULATED HIP PINNING;  Surgeon: Ronald Apley, MD;  Location: [facility] OR;  Service: Orthopedics;  Laterality: Right;   RETINAL DETACHMENT SURGERY  1975   lt   rotator cuff surgery  1997   lt   SHOULDER ARTHROSCOPY  2/15   right-got cardiac clearance-[facility]   TONSILLECTOMY     TRIGGER FINGER RELEASE Left 10/12/2013   Procedure: LEFT LONG FINGER TRIGGER RELEASE;  Surgeon: Ronald Marble, MD;  Location: [facility];  Service: Orthopedics;  Laterality: Left;    Social History   Tobacco Use  Smoking Status Former  Smokeless Tobacco Current   Types: Chew  Tobacco Comments  reports that when he smoked it was intermittent    Social History   Substance and Sexual Activity  Alcohol Use Yes   Alcohol/week: 1.0 standard drink of alcohol   Types: 1 Standard drinks or equivalent per week   Comment: occasional mainly during holiday    Social History   Socioeconomic History   Marital status: Married    Spouse name: Not on file   Number of children: 1   Years of education: Not on file   Highest education level: Not on file  Occupational History   Occupation: retired Advertising account executive: RETIRED  Tobacco Use   Smoking status: Former   Smokeless tobacco: Current    Types: Chew   Tobacco comments:    reports that when he smoked it was intermittent  Vaping Use   Vaping status: Unknown  Substance and Sexual Activity   Alcohol use: Yes    Alcohol/week: 1.0 standard drink of alcohol    Types: 1 Standard drinks or equivalent per week    Comment:  occasional mainly during holiday   Drug use: No   Sexual activity: Yes  Other Topics Concern   Not on file  Social History Narrative   Lives in [place]   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[employer], [employer])   Social Drivers of Corporate investment banker Strain: Not on file  Food Insecurity: Not on file  Transportation Needs: Not on file  Physical Activity: Not on file  Stress: Not on file  Social Connections: Unknown (02/01/2022)   Received from [facility], [facility]   Social Network    Social Network: Not on file  Intimate Partner Violence: Unknown (02/01/2022)   Received from [employer], [facility]   HITS    Physically Hurt: Not on file    Insult or Talk Down To: Not on file    Threaten Physical Harm: Not on file    Scream or Curse: Not on file    Allergies  Allergen Reactions   Promethazine Hcl Other (See Comments)    Agitation and incoherence (phenergan)   Vesicare [Solifenacin Succinate] Itching and Rash   Atorvastatin     Other reaction(s): Myalgia    Current Outpatient Medications  Medication Sig Dispense Refill   amiodarone (PACERONE) 200 MG tablet Take 1 tablet (200 mg total) by mouth daily. 90 tablet 1   amLODipine (NORVASC) 5 MG tablet Take 1 tablet (5 mg total) by mouth daily. 30 tablet 2   apixaban (ELIQUIS) 5 MG TABS tablet Take 1 tablet (5 mg total) by mouth 2 (two) times daily. 60 tablet 5   blood glucose meter kit and supplies KIT Dispense based on patient and insurance preference. Use up to four times daily as directed. 1 each 0   chlorthalidone (HYGROTON) 25 MG tablet Take 12.5 mg by mouth every morning.     clobetasol ointment (TEMOVATE) 0.05 % Apply 1 application  topically 2 (two) times daily as needed (itchy scaly on legs).     Coenzyme Q10 (COQ-10) 200 MG CAPS Take 200 mg by mouth every morning.     dicyclomine (BENTYL) 10 MG capsule 1 capsule 30 minutes before eating Orally Three times a day  as needed     famotidine (PEPCID) 40 MG tablet Take 40 mg by mouth 2 (two) times daily.     fluticasone (FLONASE) 50 MCG/ACT nasal spray Place 1 spray into both nostrils daily as needed for allergies (  sinus congestion).     gabapentin (NEURONTIN) 300 MG capsule 1 capsule Oral Once a day in evening     Glucosamine HCl 1500 MG TABS Take 3,000 mg by mouth daily.     hydrocortisone 2.5 % cream Apply 1 application  topically as needed (eye brows flakey). Use on head     Ivermectin (SOOLANTRA EX) Apply 1 application  topically daily as needed (Rosacea).     ketoconazole (NIZORAL) 2 % cream Apply 1 application  topically daily as needed for irritation (Corner of mouth (sores)).     LINZESS 145 MCG CAPS capsule Take 145 mcg by mouth daily.     LIVALO 4 MG TABS Take 2 mg by mouth at bedtime.      losartan (COZAAR) 100 MG tablet Take 100 mg by mouth daily.     Multiple Vitamin (MULTIVITAMIN WITH MINERALS) TABS tablet Take 1 tablet by mouth daily.      mupirocin ointment (BACTROBAN) 2 % Apply 1 application  topically daily as needed (tick bites).     nitroGLYCERIN (NITROSTAT) 0.4 MG SL tablet Place 1 tablet (0.4 mg total) under the tongue every 5 (five) minutes as needed for chest pain. 25 tablet 1   potassium chloride SA (KLOR-CON M) 20 MEQ tablet Take 2 tablets (40 mEq total) by mouth daily. 180 tablet 3   sucralfate (CARAFATE) 1 g tablet Take 1 g by mouth 3 (three) times daily.     No current facility-administered medications for this visit.     Family History  Problem Relation Age of Onset   Heart disease Father    Heart attack Father        12   Cancer Mother 106       LUNG   Hypertension Neg Hx    Stroke Neg Hx        Physical Exam: Not performed     Diagnostic Studies & Laboratory data: I have personally reviewed the following studies and agree with the findings     Recent Radiology Findings:       Recent Lab Findings: Lab Results  Component Value Date   WBC 5.0  03/11/2023   HGB 13.0 03/11/2023   HCT 41.4 03/11/2023   PLT 239 03/11/2023   GLUCOSE 105 (H) 03/20/2023   CHOL 184 11/14/2010   TRIG 143.0 11/14/2010   HDL 48.60 11/14/2010   LDLCALC 107 (H) 11/14/2010   ALT 20 03/11/2023   AST 28 03/11/2023   NA 144 03/20/2023   K 4.2 03/20/2023   CL 105 03/20/2023   CREATININE 1.32 (H) 03/20/2023   BUN 27 03/20/2023   CO2 26 03/20/2023   TSH 3.190 03/11/2023   INR 1.1 04/16/2022      Assessment / Plan:     Stable ascending aortic aneurysm. Will repeat CT in a year. We discussed his symptoms of flushing and how it went away after he reduced his dosage of amiodarone but returned. I told him to try stopping it but let cardiology know and see if after a few days his symtoms resolve totally.   I have spent 30 min in review of the records, viewing studies and in face to face with patient and in coordination of future care    Eugenio Hoes 03/27/2023 4:33 PM

## 2023-03-28 ENCOUNTER — Encounter: Payer: Self-pay | Admitting: Thoracic Surgery (Cardiothoracic Vascular Surgery)

## 2023-03-28 ENCOUNTER — Ambulatory Visit: Payer: Medicare PPO | Admitting: Thoracic Surgery (Cardiothoracic Vascular Surgery)

## 2023-03-28 ENCOUNTER — Ambulatory Visit
Admission: RE | Admit: 2023-03-28 | Discharge: 2023-03-28 | Disposition: A | Discharge status: Home / Self Care | Payer: Medicare PPO | Source: Ambulatory Visit | Attending: Thoracic Surgery (Cardiothoracic Vascular Surgery) | Admitting: Thoracic Surgery (Cardiothoracic Vascular Surgery)

## 2023-03-28 VITALS — BP 110/63 | HR 58 | Resp 20 | Ht 79.0 in | Wt 260.4 lb

## 2023-03-28 DIAGNOSIS — I700 Atherosclerosis of aorta: Secondary | ICD-10-CM | POA: Diagnosis not present

## 2023-03-28 DIAGNOSIS — I7121 Aneurysm of the ascending aorta, without rupture: Secondary | ICD-10-CM | POA: Diagnosis not present

## 2023-03-28 DIAGNOSIS — I77810 Thoracic aortic ectasia: Secondary | ICD-10-CM | POA: Diagnosis not present

## 2023-03-28 NOTE — Patient Instructions (Signed)
Repeat CT in a year

## 2023-04-16 DIAGNOSIS — E785 Hyperlipidemia, unspecified: Secondary | ICD-10-CM | POA: Diagnosis not present

## 2023-04-16 DIAGNOSIS — I129 Hypertensive chronic kidney disease with stage 1 through stage 4 chronic kidney disease, or unspecified chronic kidney disease: Secondary | ICD-10-CM | POA: Diagnosis not present

## 2023-04-16 DIAGNOSIS — E118 Type 2 diabetes mellitus with unspecified complications: Secondary | ICD-10-CM | POA: Diagnosis not present

## 2023-04-16 DIAGNOSIS — Z125 Encounter for screening for malignant neoplasm of prostate: Secondary | ICD-10-CM | POA: Diagnosis not present

## 2023-04-16 DIAGNOSIS — N1831 Chronic kidney disease, stage 3a: Secondary | ICD-10-CM | POA: Diagnosis not present

## 2023-04-23 DIAGNOSIS — Z1339 Encounter for screening examination for other mental health and behavioral disorders: Secondary | ICD-10-CM | POA: Diagnosis not present

## 2023-04-23 DIAGNOSIS — I129 Hypertensive chronic kidney disease with stage 1 through stage 4 chronic kidney disease, or unspecified chronic kidney disease: Secondary | ICD-10-CM | POA: Diagnosis not present

## 2023-04-23 DIAGNOSIS — I259 Chronic ischemic heart disease, unspecified: Secondary | ICD-10-CM | POA: Diagnosis not present

## 2023-04-23 DIAGNOSIS — Z0000 Encounter for general adult medical examination without abnormal findings: Secondary | ICD-10-CM | POA: Diagnosis not present

## 2023-04-23 DIAGNOSIS — J3089 Other allergic rhinitis: Secondary | ICD-10-CM | POA: Diagnosis not present

## 2023-04-23 DIAGNOSIS — R82998 Other abnormal findings in urine: Secondary | ICD-10-CM | POA: Diagnosis not present

## 2023-04-23 DIAGNOSIS — Z1331 Encounter for screening for depression: Secondary | ICD-10-CM | POA: Diagnosis not present

## 2023-04-23 DIAGNOSIS — E785 Hyperlipidemia, unspecified: Secondary | ICD-10-CM | POA: Diagnosis not present

## 2023-04-23 DIAGNOSIS — I4891 Unspecified atrial fibrillation: Secondary | ICD-10-CM | POA: Diagnosis not present

## 2023-04-23 DIAGNOSIS — E118 Type 2 diabetes mellitus with unspecified complications: Secondary | ICD-10-CM | POA: Diagnosis not present

## 2023-04-23 DIAGNOSIS — N1831 Chronic kidney disease, stage 3a: Secondary | ICD-10-CM | POA: Diagnosis not present

## 2023-04-23 DIAGNOSIS — D6869 Other thrombophilia: Secondary | ICD-10-CM | POA: Diagnosis not present

## 2023-06-28 DIAGNOSIS — L309 Dermatitis, unspecified: Secondary | ICD-10-CM | POA: Diagnosis not present

## 2023-06-28 DIAGNOSIS — Z85828 Personal history of other malignant neoplasm of skin: Secondary | ICD-10-CM | POA: Diagnosis not present

## 2023-09-19 DIAGNOSIS — M65341 Trigger finger, right ring finger: Secondary | ICD-10-CM | POA: Diagnosis not present

## 2023-09-19 DIAGNOSIS — M65342 Trigger finger, left ring finger: Secondary | ICD-10-CM | POA: Diagnosis not present

## 2023-09-23 DIAGNOSIS — Z0100 Encounter for examination of eyes and vision without abnormal findings: Secondary | ICD-10-CM | POA: Diagnosis not present

## 2023-09-23 DIAGNOSIS — Z961 Presence of intraocular lens: Secondary | ICD-10-CM | POA: Diagnosis not present

## 2023-09-23 DIAGNOSIS — E119 Type 2 diabetes mellitus without complications: Secondary | ICD-10-CM | POA: Diagnosis not present

## 2023-09-23 DIAGNOSIS — H43813 Vitreous degeneration, bilateral: Secondary | ICD-10-CM | POA: Diagnosis not present

## 2023-09-23 DIAGNOSIS — Z8669 Personal history of other diseases of the nervous system and sense organs: Secondary | ICD-10-CM | POA: Diagnosis not present

## 2023-10-07 DIAGNOSIS — M7711 Lateral epicondylitis, right elbow: Secondary | ICD-10-CM | POA: Diagnosis not present

## 2023-10-16 DIAGNOSIS — Z85828 Personal history of other malignant neoplasm of skin: Secondary | ICD-10-CM | POA: Diagnosis not present

## 2023-10-16 DIAGNOSIS — D485 Neoplasm of uncertain behavior of skin: Secondary | ICD-10-CM | POA: Diagnosis not present

## 2023-10-20 NOTE — Progress Notes (Unsigned)
 No chief complaint on file.  History of Present Illness: 83 yo male with h/o CAD, RBBB, first degree AV block, HTN, HLD, thoracic aortic aneurysm and persistent atrial fibrillation here today for cardiac follow up. He has a thoracic aortic aneurysm and is followed yearly by CT surgery with yearly chest CT scans. He is known to have CAD and had a stent placed in the RCA in 2004. Last cardiac cath in 2013 with mild disease in the LAD and Circumflex, patent stent proximal RCA and moderate disease in the small PDA. He has not tolerated statins due to muscle aches or beta blockers secondary to bradycardia. Echo September 2024 with normal LV function, mild MR, moderate AI, dilated aortic root. His thoracic aortic aneurysm is followed in CT surgery. Chest CT December 2024 with 4.9 cm ascending aorta. He has atrial fibrillation and is followed in EP. He was cardioverted to sinus 03/13/19 but converted back to atrial fib. He was started on amiodarone  and had repeat cardioversion 04/15/19 but did not maintain sinus rhythm. He had an atrial fib ablation in February 2021.   He is here today for follow up. The patient denies any chest pain, dyspnea, palpitations, lower extremity edema, orthopnea, PND, dizziness, near syncope or syncope.   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    Lung nodules    noted on past CT scans   Malaria    Paroxysmal atrial flutter (HCC)    Persistent atrial fibrillation (HCC)    SVT (supraventricular tachycardia) (HCC)    remote episode during exercise test   Thoracic  aortic aneurysm [facility])    last scan in Jan 2013. Now measuring 5.0cm. Referred to Dr. Army   Wears dentures    bottom   Wears glasses     Past Surgical History:  Procedure Laterality Date   ATRIAL FIBRILLATION ABLATION N/A 05/12/2019   Procedure: ATRIAL FIBRILLATION ABLATION;  Surgeon: Kelsie Agent, MD;  Location: [facility] INVASIVE CV LAB;  Service: Cardiovascular;  Laterality: N/A;   CARDIOVERSION N/A 03/13/2019   Procedure: CARDIOVERSION;  Surgeon: Okey Vina GAILS, MD;  Location: [facility] ENDOSCOPY;  Service: Cardiovascular;  Laterality: N/A;   CARDIOVERSION N/A 04/15/2019   Procedure: CARDIOVERSION;  Surgeon: Alveta Aleene PARAS, MD;  Location: [facility] ENDOSCOPY;  Service: Cardiovascular;  Laterality: N/A;   CARDIOVERSION N/A 06/16/2019   Procedure: CARDIOVERSION;  Surgeon: Raford Riggs, MD;  Location: [facility] ENDOSCOPY;  Service: Cardiovascular;  Laterality: N/A;   CERVICAL FUSION  03-2010   c2-c7   COLONOSCOPY     x4   CORONARY STENT PLACEMENT  2004   dental implant     x 2   ELBOW SURGERY  1997   lt and rt   EYE SURGERY     HIP PINNING,CANNULATED Right 05/11/2014   Procedure: CANNULATED HIP PINNING;  Surgeon: Evalene JONETTA Chancy, MD;  Location: [facility] OR;  Service: Orthopedics;  Laterality: Right;   RETINAL DETACHMENT SURGERY  1975   lt  rotator cuff surgery  1997   lt   SHOULDER ARTHROSCOPY  2/15   right-got cardiac clearance-[facility]   TONSILLECTOMY     TRIGGER FINGER RELEASE Left 10/12/2013   Procedure: LEFT LONG FINGER TRIGGER RELEASE;  Surgeon: Alm DELENA Hummer, MD;  Location: [facility];  Service: Orthopedics;  Laterality: Left;    Current Outpatient Medications  Medication Sig Dispense Refill   amiodarone  (PACERONE ) 200 MG tablet Take 1 tablet (200 mg total) by mouth daily. 90 tablet 1   amLODipine  (NORVASC ) 5 MG tablet Take 1 tablet (5 mg total) by mouth daily. 30 tablet 2   apixaban  (ELIQUIS ) 5 MG TABS tablet Take 1 tablet (5 mg total) by mouth 2 (two) times daily. 60 tablet 5    blood glucose meter kit and supplies KIT Dispense based on patient and insurance preference. Use up to four times daily as directed. 1 each 0   chlorthalidone  (HYGROTON ) 25 MG tablet Take 12.5 mg by mouth every morning.     clobetasol ointment (TEMOVATE) 0.05 % Apply 1 application  topically 2 (two) times daily as needed (itchy scaly on legs).     Coenzyme Q10 (COQ-10) 200 MG CAPS Take 200 mg by mouth every morning.     dicyclomine (BENTYL) 10 MG capsule 1 capsule 30 minutes before eating Orally Three times a day as needed     famotidine (PEPCID) 40 MG tablet Take 40 mg by mouth 2 (two) times daily.     fluticasone (FLONASE) 50 MCG/ACT nasal spray Place 1 spray into both nostrils daily as needed for allergies (sinus congestion).     gabapentin (NEURONTIN) 300 MG capsule 1 capsule Oral Once a day in evening     Glucosamine HCl 1500 MG TABS Take 3,000 mg by mouth daily.     hydrocortisone  2.5 % cream Apply 1 application  topically as needed (eye brows flakey). Use on head     Ivermectin (SOOLANTRA EX) Apply 1 application  topically daily as needed (Rosacea).     ketoconazole (NIZORAL) 2 % cream Apply 1 application  topically daily as needed for irritation (Corner of mouth (sores)).     LINZESS 145 MCG CAPS capsule Take 145 mcg by mouth daily.     LIVALO 4 MG TABS Take 2 mg by mouth at bedtime.      losartan (COZAAR) 100 MG tablet Take 100 mg by mouth daily.     Multiple Vitamin (MULTIVITAMIN WITH MINERALS) TABS tablet Take 1 tablet by mouth daily.      mupirocin  ointment (BACTROBAN ) 2 % Apply 1 application  topically daily as needed (tick bites).     nitroGLYCERIN  (NITROSTAT ) 0.4 MG SL tablet Place 1 tablet (0.4 mg total) under the tongue every 5 (five) minutes as needed for chest pain. 25 tablet 1   potassium chloride  SA (KLOR-CON  M) 20 MEQ tablet Take 2 tablets (40 mEq total) by mouth daily. 180 tablet 3   sucralfate (CARAFATE) 1 g tablet Take 1 g by mouth 3 (three) times daily.     No current  facility-administered medications for this visit.    Allergies  Allergen Reactions   Promethazine Hcl Other (See Comments)    Agitation and incoherence (phenergan)   Vesicare [Solifenacin Succinate] Itching and Rash   Atorvastatin     Other reaction(s): Myalgia    Social History   Socioeconomic History   Marital status: Married    Spouse name: Not on file   Number of children: 1  Years of education: Not on file   Highest education level: Not on file  Occupational History   Occupation: retired Advertising account executive: RETIRED  Tobacco Use   Smoking status: Former   Smokeless tobacco: Current    Types: Chew   Tobacco comments:    reports that when he smoked it was intermittent  Vaping Use   Vaping status: Unknown  Substance and Sexual Activity   Alcohol  use: Yes    Alcohol /week: 1.0 standard drink of alcohol     Types: 1 Standard drinks or equivalent per week    Comment: occasional mainly during holiday   Drug use: No   Sexual activity: Yes  Other Topics Concern   Not on file  Social History Narrative   Lives in [place]   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[employer])   Social Drivers of Corporate investment banker Strain: Not on file  Food Insecurity: Not on file  Transportation Needs: Not on file  Physical Activity: Not on file  Stress: Not on file  Social Connections: Unknown (02/01/2022)   Received from [facility]   Social Network    Social Network: Not on file  Intimate Partner Violence: Unknown (02/01/2022)   Received from [facility]   HITS    Physically Hurt: Not on file    Insult or Talk Down To: Not on file    Threaten Physical Harm: Not on file    Scream or Curse: Not on file    Family History  Problem Relation Age of Onset   Heart disease Father    Heart attack Father        52   Cancer Mother 41       LUNG   Hypertension Neg Hx    Stroke Neg Hx     Review of Systems:  As stated  in the HPI and otherwise negative.   There were no vitals taken for this visit.  Physical Examination: General: Well developed, well nourished, NAD  HEENT: OP clear, mucus membranes moist  SKIN: warm, dry. No rashes. Neuro: No focal deficits  Musculoskeletal: Muscle strength 5/5 all ext  Psychiatric: Mood and affect normal  Neck: No JVD, no carotid bruits, no thyromegaly, no lymphadenopathy.  Lungs:Clear bilaterally, no wheezes, rhonci, crackles Cardiovascular: Regular rate and rhythm. No murmurs, gallops or rubs. Abdomen:Soft. Bowel sounds present. Non-tender.  Extremities: No lower extremity edema. Pulses are 2 + in the bilateral DP/PT.  EKG:  EKG is *** ordered today. The ekg ordered today demonstrates   Recent Labs: 03/11/2023: ALT 20; Hemoglobin 13.0; Magnesium  2.3; Platelets 239; TSH 3.190 03/20/2023: BUN 27; Creatinine, Ser 1.32; Potassium 4.2; Sodium 144   Lipid Panel Followed in primary care   Wt Readings from Last 3 Encounters:  03/28/23 260 lb 6.4 oz (118.1 kg)  03/21/23 260 lb 12.8 oz (118.3 kg)  03/11/23 255 lb (115.7 kg)    Assessment and Plan:   1. CAD without angina: No chest pain. Last cath in 2013 with patent stent RCA, mild disease in the LAD and Circumflex. Stress test April 2022 with no ischemia. No beta blocker due to bradycardia. He is not on ASA since he is on Eliquis . *** ? Statin   2. Thoracic Aortic aneurysm: Followed in the CT surgery clinic. Chest CT December 2024 with 4.9 cm ascending aorta. *** make sure he has follow up   3. HTN: BP is controlled.   4. HLD: Lipids  followed in primary care. LDL ***. *** ? Statin and Zetia.     5. Aortic Valve disease: Mild to moderate by echo in April 2022.    6. Persistent atrial fibrillation: Sinus today. Continue amiodarone  and Eliquis   Labs/ tests ordered today include:  No orders of the defined types were placed in this encounter.  Disposition:   F/U with me in 12 months  Signed, [name], MD 10/20/2023 11:02 AM    [facility] [address] [place], [place], [place]  [address] Phone: [phone]; Fax: [phone]

## 2023-10-21 ENCOUNTER — Ambulatory Visit: Attending: Cardiovascular Disease | Admitting: Cardiovascular Disease

## 2023-10-21 ENCOUNTER — Encounter: Payer: Self-pay | Admitting: Cardiovascular Disease

## 2023-10-21 VITALS — BP 118/70 | HR 57 | Ht 79.0 in | Wt 264.6 lb

## 2023-10-21 DIAGNOSIS — I10 Essential (primary) hypertension: Secondary | ICD-10-CM | POA: Diagnosis not present

## 2023-10-21 DIAGNOSIS — I351 Nonrheumatic aortic (valve) insufficiency: Secondary | ICD-10-CM

## 2023-10-21 DIAGNOSIS — I2510 Atherosclerotic heart disease of native coronary artery without angina pectoris: Secondary | ICD-10-CM | POA: Diagnosis not present

## 2023-10-21 DIAGNOSIS — E785 Hyperlipidemia, unspecified: Secondary | ICD-10-CM | POA: Diagnosis not present

## 2023-10-21 DIAGNOSIS — I4819 Other persistent atrial fibrillation: Secondary | ICD-10-CM | POA: Diagnosis not present

## 2023-10-21 DIAGNOSIS — I7121 Aneurysm of the ascending aorta, without rupture: Secondary | ICD-10-CM

## 2023-10-21 NOTE — Patient Instructions (Addendum)
 Medication Instructions:   No changes   *If you need a refill on your cardiac medications before your next appointment, please call your pharmacy*  Follow-Up: At [facility], you and your health needs are our priority.  As part of our continuing mission to provide you with exceptional heart care, our providers are all part of one team.  This team includes your primary Cardiologist (physician) and Advanced Practice Providers or APPs (Physician Assistants and Nurse Practitioners) who all work together to provide you with the care you need, when you need it.  Your next appointment:   1 year(s)  Provider:   [name], MD    We recommend signing up for the patient portal called MyChart.  Sign up information is provided on this After Visit Summary.  MyChart is used to connect with patients for Virtual Visits (Telemedicine).  Patients are able to view lab/test results, encounter notes, upcoming appointments, etc.  Non-urgent messages can be sent to your provider as well.   To learn more about what you can do with MyChart, go to [email or web address].

## 2023-10-31 DIAGNOSIS — R2231 Localized swelling, mass and lump, right upper limb: Secondary | ICD-10-CM | POA: Diagnosis not present

## 2023-11-04 DIAGNOSIS — E118 Type 2 diabetes mellitus with unspecified complications: Secondary | ICD-10-CM | POA: Diagnosis not present

## 2023-11-04 DIAGNOSIS — I129 Hypertensive chronic kidney disease with stage 1 through stage 4 chronic kidney disease, or unspecified chronic kidney disease: Secondary | ICD-10-CM | POA: Diagnosis not present

## 2023-11-04 DIAGNOSIS — I4891 Unspecified atrial fibrillation: Secondary | ICD-10-CM | POA: Diagnosis not present

## 2023-11-04 DIAGNOSIS — E785 Hyperlipidemia, unspecified: Secondary | ICD-10-CM | POA: Diagnosis not present

## 2023-11-04 DIAGNOSIS — D6869 Other thrombophilia: Secondary | ICD-10-CM | POA: Diagnosis not present

## 2023-11-04 DIAGNOSIS — I259 Chronic ischemic heart disease, unspecified: Secondary | ICD-10-CM | POA: Diagnosis not present

## 2023-11-04 DIAGNOSIS — I712 Thoracic aortic aneurysm, without rupture, unspecified: Secondary | ICD-10-CM | POA: Diagnosis not present

## 2023-11-04 DIAGNOSIS — N1831 Chronic kidney disease, stage 3a: Secondary | ICD-10-CM | POA: Diagnosis not present

## 2023-11-07 ENCOUNTER — Telehealth: Payer: Self-pay

## 2023-11-07 NOTE — Telephone Encounter (Signed)
   Pre-operative Risk Assessment    Patient Name: Ronald [name]  DOB: June 03, 1940 MRN: [id number]   Date of last office visit: 10/21/23 Date of next office visit: n/a   Request for Surgical Clearance    Procedure:  Bony lesion on ring finger   Date of Surgery:  Clearance TBD                                 Surgeon:  Dr. Ozell Hock  Surgeon's Group or Practice Name:  [facility] Phone number:  [phone] Fax number:  [phone]   Type of Clearance Requested:   - Medical  - Pharmacy:  Hold Apixaban  (Eliquis ) usually ask to be held for 7-10 days   Type of Anesthesia:  Local    Additional requests/questions:    [name]   11/07/2023, 4:51 PM

## 2023-11-12 NOTE — Telephone Encounter (Signed)
 Dr. Verlin,  Mr. Steffenhagen is requesting preoperative cardiac evaluation for excision of bony lesion on ring finger.  He was recently seen by you in clinic on 10/21/2023.  He remained stable from a cardiac standpoint at that time.  His PMH includes coronary artery disease, RBBB, first-degree AV block, hypertension, hyperlipidemia, and thoracic aortic aneurysm as well as persistent atrial fibrillation.  Follow-up was planned for 12 months.  Would you be able to comment on cardiac risk for upcoming procedure?  Thank you for your help.  Please direct your response to CV DIV preop pool.  Josefa HERO. Armand Preast NP-C     11/12/2023, 3:41 PM [facility] [address] Office [phone] Fax [phone]

## 2023-11-12 NOTE — Telephone Encounter (Signed)
 Patient with diagnosis of atrial fibrillation on Eliquis  for anticoagulation.    Procedure:  Bony lesion on ring finger    Date of Surgery:  Clearance TBD     CHA2DS2-VASc Score = 4   This indicates a 4.8% annual risk of stroke. The patient's score is based upon: CHF History: 0 HTN History: 1 Diabetes History: 0 Stroke History: 0 Vascular Disease History: 1 Age Score: 2 Gender Score: 0    CrCl 76 Platelet count 239  Patient has not had an Afib/aflutter ablation within the last 3 months or DCCV within the last 30 days  Per office protocol, patient can hold Eliquis  for 2-3 days prior to procedure.   Patient will not need bridging with Lovenox (enoxaparin) around procedure.  Patient has good renal function and should easily clear medication in 2-3 days.  If surgeon wants more than that will need to get MD approval for hold.    **This guidance is not considered finalized until pre-operative APP has relayed final recommendations.**

## 2023-11-13 NOTE — Telephone Encounter (Signed)
   Patient Name: Ronald [name]  DOB: [birth year] MRN: [id number]  Primary Cardiologist: [name], MD  Chart reviewed as part of pre-operative protocol coverage. Given past medical history and time since last visit, based on ACC/AHA guidelines, Ronald [name] is at acceptable risk for the planned procedure without further cardiovascular testing.   Patient has good renal function and should easily clear medication in 2-3 days. If surgeon wants more than that will need to get MD approval for hold.   Per Dr. Cash on 11/13/2023, okay to proceed with surgery.  The patient was advised that if he develops new symptoms prior to surgery to contact our office to arrange for a follow-up visit, and he verbalized understanding.  I will route this recommendation to the requesting party via Epic fax function and remove from pre-op pool.  Please call with questions.  Lamarr Satterfield, NP 11/13/2023, 10:58 AM

## 2023-11-15 NOTE — Telephone Encounter (Signed)
 2nd request received. Will route preop clearance to surgeons office

## 2023-12-25 DIAGNOSIS — M71349 Other bursal cyst, unspecified hand: Secondary | ICD-10-CM | POA: Diagnosis not present

## 2023-12-25 DIAGNOSIS — R2231 Localized swelling, mass and lump, right upper limb: Secondary | ICD-10-CM | POA: Diagnosis not present

## 2023-12-31 DIAGNOSIS — Z85828 Personal history of other malignant neoplasm of skin: Secondary | ICD-10-CM | POA: Diagnosis not present

## 2023-12-31 DIAGNOSIS — L718 Other rosacea: Secondary | ICD-10-CM | POA: Diagnosis not present

## 2023-12-31 DIAGNOSIS — D1801 Hemangioma of skin and subcutaneous tissue: Secondary | ICD-10-CM | POA: Diagnosis not present

## 2024-01-03 DIAGNOSIS — M7711 Lateral epicondylitis, right elbow: Secondary | ICD-10-CM | POA: Diagnosis not present

## 2024-01-06 DIAGNOSIS — M7711 Lateral epicondylitis, right elbow: Secondary | ICD-10-CM | POA: Diagnosis not present

## 2024-01-28 DIAGNOSIS — C44311 Basal cell carcinoma of skin of nose: Secondary | ICD-10-CM | POA: Diagnosis not present

## 2024-01-28 DIAGNOSIS — Z85828 Personal history of other malignant neoplasm of skin: Secondary | ICD-10-CM | POA: Diagnosis not present

## 2024-01-28 DIAGNOSIS — M7130 Other bursal cyst, unspecified site: Secondary | ICD-10-CM | POA: Diagnosis not present

## 2024-01-28 DIAGNOSIS — D171 Benign lipomatous neoplasm of skin and subcutaneous tissue of trunk: Secondary | ICD-10-CM | POA: Diagnosis not present

## 2024-01-28 DIAGNOSIS — L718 Other rosacea: Secondary | ICD-10-CM | POA: Diagnosis not present

## 2024-01-28 DIAGNOSIS — L821 Other seborrheic keratosis: Secondary | ICD-10-CM | POA: Diagnosis not present

## 2024-02-10 DIAGNOSIS — C44311 Basal cell carcinoma of skin of nose: Secondary | ICD-10-CM | POA: Diagnosis not present

## 2024-02-20 ENCOUNTER — Other Ambulatory Visit: Payer: Self-pay

## 2024-02-20 ENCOUNTER — Encounter (HOSPITAL_COMMUNITY): Payer: Self-pay

## 2024-02-20 ENCOUNTER — Emergency Department (HOSPITAL_COMMUNITY)

## 2024-02-20 ENCOUNTER — Emergency Department (HOSPITAL_COMMUNITY)
Admission: EM | Admit: 2024-02-20 | Discharge: 2024-02-20 | Disposition: A | Discharge status: Home / Self Care | Attending: Emergency Medicine | Admitting: Emergency Medicine

## 2024-02-20 ENCOUNTER — Telehealth: Payer: Self-pay | Admitting: Cardiovascular Disease

## 2024-02-20 DIAGNOSIS — Z79899 Other long term (current) drug therapy: Secondary | ICD-10-CM | POA: Insufficient documentation

## 2024-02-20 DIAGNOSIS — I10 Essential (primary) hypertension: Secondary | ICD-10-CM | POA: Insufficient documentation

## 2024-02-20 DIAGNOSIS — R0602 Shortness of breath: Secondary | ICD-10-CM | POA: Insufficient documentation

## 2024-02-20 DIAGNOSIS — I4891 Unspecified atrial fibrillation: Secondary | ICD-10-CM | POA: Diagnosis not present

## 2024-02-20 DIAGNOSIS — I7121 Aneurysm of the ascending aorta, without rupture: Secondary | ICD-10-CM

## 2024-02-20 DIAGNOSIS — R0789 Other chest pain: Secondary | ICD-10-CM | POA: Diagnosis not present

## 2024-02-20 DIAGNOSIS — Z7901 Long term (current) use of anticoagulants: Secondary | ICD-10-CM | POA: Diagnosis not present

## 2024-02-20 LAB — BASIC METABOLIC PANEL WITH GFR
Anion gap: 12 (ref 5–15)
BUN: 25 mg/dL — ABNORMAL HIGH (ref 8–23)
CO2: 26 mmol/L (ref 22–32)
Calcium: 8.8 mg/dL — ABNORMAL LOW (ref 8.9–10.3)
Chloride: 101 mmol/L (ref 98–111)
Creatinine, Ser: 1.5 mg/dL — ABNORMAL HIGH (ref 0.61–1.24)
GFR, Estimated: 46 mL/min — ABNORMAL LOW (ref >60)
Glucose, Bld: 123 mg/dL — ABNORMAL HIGH (ref 70–99)
Potassium: 3.6 mmol/L (ref 3.5–5.1)
Sodium: 139 mmol/L (ref 135–145)

## 2024-02-20 LAB — CBC
HCT: 39.2 % (ref 39.0–52.0)
Hemoglobin: 11.9 g/dL — ABNORMAL LOW (ref 13.0–17.0)
MCH: 27.4 pg (ref 26.0–34.0)
MCHC: 30.4 g/dL (ref 30.0–36.0)
MCV: 90.3 fL (ref 80.0–100.0)
Platelets: 225 K/uL (ref 150–400)
RBC: 4.34 MIL/uL (ref 4.22–5.81)
RDW: 13.9 % (ref 11.5–15.5)
WBC: 6 K/uL (ref 4.0–10.5)
nRBC: 0 % (ref 0.0–0.2)

## 2024-02-20 LAB — TROPONIN I (HIGH SENSITIVITY)
Troponin I (High Sensitivity): 11 ng/L (ref <18)
Troponin I (High Sensitivity): 12 ng/L (ref <18)

## 2024-02-20 LAB — BRAIN NATRIURETIC PEPTIDE: B Natriuretic Peptide: 62.8 pg/mL (ref 0.0–100.0)

## 2024-02-20 LAB — MAGNESIUM: Magnesium: 2.2 mg/dL (ref 1.7–2.4)

## 2024-02-20 NOTE — ED Triage Notes (Signed)
 Patient states that he is having chest pain, shob, and increased heart rate. He states that all his symptoms started about a week ago. He gives out if he tries to walk far. Chest pain is central and does not radiate. Patient reports he has a history of a-fib which he takes eliquis  for.

## 2024-02-20 NOTE — Telephone Encounter (Signed)
 Received call transferred directly from operator and spoke with patient.  He reports heart has been racing recently and he is worn out when doing activities such as going out to his barn. Having chest pain at times which he describes as an ache or tightness near his breast bone.  He is currently having this pain.  I advised patient to call 911 for transport to the hospital.  He reports he is very close to the hospital and will go to the ED now.  His wife is with him.

## 2024-02-20 NOTE — Discharge Instructions (Addendum)
 Return for any new or worse symptoms.  Return for rapid heart rate lasting 40 minutes or longer.  Return for worsening chest pain.  Return for worsening shortness of breath.  Today's workup very reassuring.  Contact cardiology for follow-up tomorrow.  Call them and they can probably see you next week.  Cardiology may want to repeat your echocardiogram.  Would also recommend that they follow-up on your kidney function.

## 2024-02-20 NOTE — Telephone Encounter (Signed)
 Pt c/o of Chest Pain: STAT if active (IN THIS MOMENT) CP, including tightness, pressure, jaw pain, shoulder/upper arm/back pain, SOB, nausea, and vomiting.  1. Are you having CP right now (tightness, pressure, or discomfort)? Discomfort under breast bone   2. Are you experiencing any other symptoms (ex. SOB, nausea, vomiting, sweating)? sob  3. How long have you been experiencing CP? A week   4. Is your CP continuous or coming and going? Coming and going   5. Have you taken Nitroglycerin ? no  6. If CP returns before callback, please consider calling 911. ?

## 2024-02-20 NOTE — ED Provider Triage Note (Signed)
 Emergency Medicine Provider Triage Evaluation Note  HAARIS METALLO , a 83 y.o. male  was evaluated in triage.  Pt complains of longstanding cardiac history history of stent placements A-fib on Eliquis .  Patient over the last 10 days has had episodes of palpitations and new significant chest tightness and shortness of breath with exertion.  No active symptoms at this moment.  Review of Systems  Positive: Exertional chest pain and shortness of breath Negative: Loc   Physical Exam  BP (!) 151/75 (BP Location: Right Arm)   Pulse 63   Temp 97.8 F (36.6 C)   Resp 16   Ht 6' 7 (2.007 m)   Wt 120.2 kg   SpO2 96%   BMI 29.85 kg/m  Gen:   Awake, no distress   Resp:  Normal effort  MSK:   Moves extremities without difficulty  Other:    Medical Decision Making  Medically screening exam initiated at 1:56 PM.  Appropriate orders placed.  NEILS SIRACUSA was informed that the remainder of the evaluation will be completed by another provider, this initial triage assessment does not replace that evaluation, and the importance of remaining in the ED until their evaluation is complete.     Arloa Chroman, PA-C 02/20/24 1358

## 2024-02-20 NOTE — ED Provider Notes (Addendum)
 [place] [facility]ERGENCY DEPARTMENT AT [facility] Provider Note   CSN: [id number] Arrival date & time: 02/20/24  1302     Patient presents with: Chest Pain and Shortness of Breath   Ronald [name] is a 83 y.o. male.   Patient for the past week with some increased exertional shortness of breath.  And occasionally increased heart rate.  And chest pressure.  Patient has been a little bit more sedentary thinks he may be deconditioned.  Patient is followed by cardiology.  He is known to have a right bundle branch block first-degree AV block hypertension hyperlipidemia thoracic aortic aneurysm atrial fibrillation is on Eliquis .  Cardiac monitoring here today does not show that he is in atrial fibs.  His last cardiac cath was in 2013 mild disease in the LAD and circumflex with patent stent in the proximal RCA and moderate disease in the small PDA.  Echo September 24 with normal LV function mild MR moderate AI a dilated aortic root.       Prior to Admission medications   Medication Sig Start Date End Date Taking? Authorizing Provider  amiodarone  (PACERONE ) 200 MG tablet Take 1 tablet (200 mg total) by mouth daily. 03/11/23   Dunn, Dayna N, PA-C  amLODipine  (NORVASC ) 5 MG tablet Take 1 tablet (5 mg total) by mouth daily. Patient not taking: Reported on 10/21/2023 12/20/22   Yolande Lamar BROCKS, MD  apixaban  (ELIQUIS ) 5 MG TABS tablet Take 1 tablet (5 mg total) by mouth 2 (two) times daily. 12/03/22   Lucien Orren SAILOR, PA-C  blood glucose meter kit and supplies KIT Dispense based on patient and insurance preference. Use up to four times daily as directed. 10/31/21   Phebe Fonda GORMAN, MD  chlorthalidone  (HYGROTON ) 25 MG tablet Take 12.5 mg by mouth every morning. 09/05/21   [provider]  clobetasol ointment (TEMOVATE) 0.05 % Apply 1 application  topically 2 (two) times daily as needed (itchy scaly on legs).    [provider]  clobetasol ointment (TEMOVATE) 0.05 % Apply 1  Application topically 2 (two) times daily.    [provider]  Coenzyme Q10 (COQ-10) 200 MG CAPS Take 200 mg by mouth every morning.    [provider]  dicyclomine (BENTYL) 10 MG capsule 1 capsule 30 minutes before eating Orally Three times a day as needed 12/04/21   [provider]  famotidine (PEPCID) 40 MG tablet Take 40 mg by mouth 2 (two) times daily. 03/07/22   [provider]  fluticasone (FLONASE) 50 MCG/ACT nasal spray Place 1 spray into both nostrils daily as needed for allergies (sinus congestion).    [provider]  gabapentin (NEURONTIN) 300 MG capsule 1 capsule Oral Once a day in evening    [provider]  Glucosamine HCl 1500 MG TABS Take 3,000 mg by mouth daily.    [provider]  hydrocortisone  2.5 % cream Apply 1 application  topically as needed (eye brows flakey). Use on head 07/09/14   [provider]  Ivermectin (SOOLANTRA EX) Apply 1 application  topically daily as needed (Rosacea).    [provider]  ketoconazole (NIZORAL) 2 % cream Apply 1 application  topically daily as needed for irritation (Corner of mouth (sores)).    [provider]  LINZESS 145 MCG CAPS capsule Take 145 mcg by mouth daily.    [provider]  LIVALO 4 MG TABS Take 2 mg by mouth at bedtime.  02/24/19   [provider]  losartan (COZAAR) 100 MG tablet Take 100 mg by mouth daily. Patient not taking: Reported on 10/21/2023 11/19/22   [provider]  Multiple Vitamin (MULTIVITAMIN WITH MINERALS) TABS tablet Take 1 tablet by mouth daily.     [provider]  mupirocin  ointment (BACTROBAN ) 2 % Apply 1 application  topically daily as needed (tick bites).    [provider]  nitroGLYCERIN  (NITROSTAT ) 0.4 MG SL tablet Place 1 tablet (0.4 mg total) under the tongue every 5 (five) minutes as needed for chest pain. 12/03/22   Lucien Orren SAILOR, PA-C  olmesartan (BENICAR) 20 MG tablet  Take 20 mg by mouth daily. 06/26/23   [provider]  potassium chloride  SA (KLOR-CON  M) 20 MEQ tablet Take 2 tablets (40 mEq total) by mouth daily. 03/12/23   Dunn, Dayna N, PA-C  sucralfate (CARAFATE) 1 g tablet Take 1 g by mouth 3 (three) times daily.    [provider]    Allergies: Promethazine hcl, Vesicare [solifenacin succinate], and Atorvastatin    Review of Systems  Constitutional:  Negative for chills and fever.  HENT:  Negative for ear pain and sore throat.   Eyes:  Negative for pain and visual disturbance.  Respiratory:  Positive for shortness of breath. Negative for cough.   Cardiovascular:  Positive for chest pain and palpitations.  Gastrointestinal:  Negative for abdominal pain and vomiting.  Genitourinary:  Negative for dysuria and hematuria.  Musculoskeletal:  Negative for arthralgias and back pain.  Skin:  Negative for color change and rash.  Neurological:  Negative for seizures and syncope.  All other systems reviewed and are negative.   Updated Vital Signs BP (!) 190/75 (BP Location: Right Arm)   Pulse (!) 55   Temp 97.8 F (36.6 C) (Oral)   Resp 11   Ht 2.007 m (6' 7)   Wt 120.2 kg   SpO2 100%   BMI 29.85 kg/m   Physical Exam Vitals and nursing note reviewed.  Constitutional:      General: He is not in acute distress.    Appearance: Normal appearance. He is well-developed.  HENT:     Head: Normocephalic and atraumatic.  Eyes:     Extraocular Movements: Extraocular movements intact.     Conjunctiva/sclera: Conjunctivae normal.     Pupils: Pupils are equal, round, and reactive to light.  Cardiovascular:     Rate and Rhythm: Normal rate and regular rhythm.     Heart sounds: No murmur heard. Pulmonary:     Effort: Pulmonary effort is normal. No respiratory distress.     Breath sounds: Normal breath sounds.  Abdominal:     General: There is no distension.     Palpations: Abdomen is soft.     Tenderness: There is no abdominal  tenderness.  Musculoskeletal:        General: No swelling.     Cervical back: Normal range of motion and neck supple.     Comments: Trace edema to both lower extremities  Skin:    General: Skin is warm and dry.     Capillary Refill: Capillary refill takes less than 2 seconds.  Neurological:     General: No focal deficit present.     Mental Status: He is alert and oriented to person, place, and time.  Psychiatric:        Mood and Affect: Mood normal.     (all labs ordered are listed, but only abnormal results are displayed) Labs Reviewed  CBC -  Abnormal; Notable for the following components:      Result Value   Hemoglobin 11.9 (*)    All other components within normal limits  BASIC METABOLIC PANEL WITH GFR - Abnormal; Notable for the following components:   Glucose, Bld 123 (*)    BUN 25 (*)    Creatinine, Ser 1.50 (*)    Calcium 8.8 (*)    GFR, Estimated 46 (*)    All other components within normal limits  BRAIN NATRIURETIC PEPTIDE  MAGNESIUM   TROPONIN I (HIGH SENSITIVITY)  TROPONIN I (HIGH SENSITIVITY)    EKG: EKG Interpretation Date/Time:  Thursday February 20 2024 13:14:29 EST Ventricular Rate:  62 PR Interval:  214 QRS Duration:  172 QT Interval:  428 QTC Calculation: 434 R Axis:   -70  Text Interpretation: Sinus rhythm with 1st degree A-V block Right bundle branch block Left anterior fascicular block Bifascicular block Abnormal ECG When compared with ECG of 11-Mar-2023 09:19, PREVIOUS ECG IS PRESENT Confirmed by Mollee Neer [phone]) on 02/20/2024 11:08:05 PM  Radiology: [facility] Chest 2 View Result Date: 02/20/2024 CLINICAL DATA:  Shortness of breath EXAM: CHEST - 2 VIEW COMPARISON:  Chest x-ray 12/20/2022 FINDINGS: The heart size and mediastinal contours are within normal limits. Both lungs are clear. The visualized skeletal structures are unremarkable. IMPRESSION: No active cardiopulmonary disease. Electronically Signed   By: Greig Pique M.D.   On: 02/20/2024  15:21     Procedures   Medications Ordered in the ED - No data to display                                  Medical Decision Making  Troponins normal 11 and 12.  No significant change.  Basic metabolic panel GFR is 46.  Creatinine 1.5 will need to compare that to old magnesium  normal 2.2 white blood cell count 6 hemoglobin 11.9 BNP 62.  Two-view chest without any acute findings.  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  EKG from December did have an intraventricular conduction delay consistent with known right bundle branch block.  And did have a sinus bradycardia with first-degree AV block.  Patient's lab work here is reassuring.  Cannot rule out some right sided heart failure as the cause of the exertional shortness of breath could be deconditioning as well.  Patient's kidney function is a little bit worse here today.  With that creatinine 1.5 is usually about 1.34 so close follow-up will be important with that.  Potassium is normal.  Patient's cardiac monitoring here is sinus rhythm that is not in atrial fibrillation  Final diagnoses:  Atypical chest pain  Exertional shortness of breath    ED Discharge Orders     None          Geraldene Hamilton, MD 02/20/24 2324    Geraldene Hamilton, MD 02/20/24 2324

## 2024-02-21 ENCOUNTER — Encounter: Payer: Self-pay | Admitting: *Deleted

## 2024-02-23 NOTE — Assessment & Plan Note (Signed)
 History of prior PCI to the RCA.  Cardiac catheterization 2013 with patent RCA stent.  Myoview  in 2022  low risk. Recent visit to the ED with chest pain, shortness of breath, palpitations. Troponins were neg. EKG showed NSR. CXR, BNP were normal.  He is describing progressive symptoms over the past couple of months, including exertional and nocturnal chest discomfort and dyspnea. Symptoms reminiscent of previous angina prior to PCI.  I have recommended proceeding with cardiac catheterization.  I reviewed this with Dr. Verlin who agreed.  Patient does not take any PDE-5 inhibitors. - Arrange cardiac catheterization this week - Start isosorbide mononitrate 30 mg daily - Continue amlodipine  5 mg daily - Continue pravastatin 10 mg daily - Continue nitroglycerin  as needed for chest pain - Hold Eliquis  for 2 days before catheterization - Follow up post cardiac catheterization

## 2024-02-23 NOTE — H&P (View-Only) (Signed)
 OFFICE NOTE:    Date:  02/24/2024  ID:  Ronald [name], DOB 11/18/1940, MRN [id number] PCP: Ronald Anes, MD  [facility] Providers Cardiologist:  Ronald Cash, MD Electrophysiologist:  Ronald ONEIDA HOLTS, MD        Coronary artery disease          S/p stent to RCA in 2004 [facility] 06/2011: pRCA stent patent, PDA 60 Myoview  4/22: low risk  Persistent atrial fibrillation Failed DCCV x 2 and failed Amiodarone  S/p PVI ablation in 2/21  Mild Aortic Insufficiency  Echocardiogram 08/05/20: EF 55-60, no RWMA, normal RVSF, mild AI, moderate to severe dilation of aortic root (51 mm), dilated ascending aorta (50 mm)  TTE 12/24/2022: EF 60-65, no RWMA, GR 2 DD, severe LVE, LVIDd 7.0 cm, normal RVSF, moderate RVE, mild MR, moderate AI, aortic root 53 mm, ascending aorta 52 mm Bradycardia; no beta-blocker  Right Bundle Branch Block  Hypertension  Hyperlipidemia  Intol of statins  Ascending thoracic aortic aneurysm  CT 03/28/23: 4.9 cm PVCs GERD         Discussed the use of AI scribe software for clinical note transcription with the patient, who gave verbal consent to proceed. History of Present Illness Ronald [name] is a 83 y.o. male for follow up after a visit to the ED. He was last seen by Dr. Cash in 10/2023. He went to the ED on 02/20/24 with shortness of breath, chest pain, palpitations.  EKG demonstrated right bundle branch block and left anterior fascicular block which is similar to what has been seen in the past.  BNP was normal.  Hemoglobin was slightly low 11.9.  Chest x-ray showed no acute disease creatinine was slightly elevated at 1.5.  Troponins were negative.  He was set up for outpatient cardiology follow-up.  He has experienced progressively worsening chest pain and shortness of breath over the past couple of months. The chest discomfort is described as pressure in the middle of the chest, occurring both with exertion and at rest, and is sometimes  accompanied by palpitations. These symptoms are reminiscent of his previous angina prior to his percutaneous coronary intervention (PCI). He has not used nitroglycerin  to relieve these symptoms. He reports chronic swelling in his left leg, which has been present for years. No fever, chills, cough, vomiting, diarrhea, or pain on deep breathing. He does not smoke.    ROS-See HPI    Studies Reviewed:       Labs 03/11/23: ALT 20, TSH 3.19 02/20/2024: K 3.6, creatinine 1.5, eGFR 46, magnesium  2.2, Hgb 11.9, PLT 225K  02/20/2024: hsTrop 11 -12; BNP 62.8  EKG  02/20/24: NSR, RBBB, LAFB   Risk Assessment/Calculations: CHA2DS2-VASc Score = 4   This indicates a 4.8% annual risk of stroke. The patient's score is based upon: CHF History: 0 HTN History: 1 Diabetes History: 0 Stroke History: 0 Vascular Disease History: 1 Age Score: 2 Gender Score: 0            Physical Exam:  VS:  BP 134/76   Pulse 76   Ht 6' 7 (2.007 m)   Wt 268 lb (121.6 kg)   SpO2 96%   BMI 30.19 kg/m        Wt Readings from Last 3 Encounters:  02/24/24 268 lb (121.6 kg)  02/20/24 265 lb (120.2 kg)  10/21/23 264 lb 9.6 oz (120 kg)    Constitutional:      Appearance: Healthy appearance. Not in distress.  Neck:  Vascular: JVD normal.  Pulmonary:     Breath sounds: Normal breath sounds. No wheezing. No rales.  Cardiovascular:     Normal rate. Regular rhythm.     Murmurs: There is no murmur.  Edema:    Peripheral edema absent.  Abdominal:     Palpations: Abdomen is soft.       Assessment and Plan:    Assessment & Plan Precordial chest pain Shortness of breath Coronary artery disease involving native coronary artery of native heart with angina pectoris History of prior PCI to the RCA.  Cardiac catheterization 2013 with patent RCA stent.  Myoview  in 2022  low risk. Recent visit to the ED with chest pain, shortness of breath, palpitations. Troponins were neg. EKG showed NSR. CXR, BNP were normal.   He is describing progressive symptoms over the past couple of months, including exertional and nocturnal chest discomfort and dyspnea. Symptoms reminiscent of previous angina prior to PCI.  I have recommended proceeding with cardiac catheterization.  I reviewed this with Dr. Verlin who agreed.  Patient does not take any PDE-5 inhibitors. - Arrange cardiac catheterization this week - Start isosorbide  mononitrate 30 mg daily - Continue amlodipine  5 mg daily - Continue pravastatin 10 mg daily - Continue nitroglycerin  as needed for chest pain - Hold Eliquis  for 2 days before catheterization - Follow up post cardiac catheterization  Persistent atrial fibrillation (HCC) S/p PVI ablation in 2021. Currently on Eliquis  for anticoagulation. Recent creatinine level at 1.5, which may necessitate adjustment of Eliquis  dosage if it remains elevated. - Continue Eliquis  5 mg twice daily - If creatinine consistently 1.5 or greater, will decrease Eliquis  to 2.5 mg twice daily - Continue amiodarone  200 mg daily Nonrheumatic aortic valve insufficiency Mod AI by TTE in 12/2022. Of note, LV was severely dilated at that time. 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  He is followed by CT surgery.      Informed Consent   Shared Decision Making/Informed Consent The risks [stroke (1 in 1000), death (1 in 1000), kidney failure [usually temporary] (1 in 500), bleeding (1 in 200), allergic reaction [possibly serious] (1 in 200)], benefits (diagnostic support and management of coronary artery disease) and alternatives of a cardiac catheterization were discussed in detail with Mr. Ronald [name] and he is willing to proceed.     Dispo:  Return for Post Procedure Follow Up.  Signed, Glendia Ferrier, PA-C

## 2024-02-23 NOTE — Assessment & Plan Note (Signed)
 S/p PVI ablation in 2021. Currently on Eliquis  for anticoagulation. Recent creatinine level at 1.5, which may necessitate adjustment of Eliquis  dosage if it remains elevated. - Continue Eliquis  5 mg twice daily - If creatinine consistently 1.5 or greater, will decrease Eliquis  to 2.5 mg twice daily - Continue amiodarone  200 mg daily

## 2024-02-23 NOTE — Progress Notes (Signed)
 OFFICE NOTE:    Date:  02/24/2024  ID:  [name], DOB 11/18/1940, MRN [id number] PCP: Shayne Anes, MD  [facility] Providers Cardiologist:  [name], MD Electrophysiologist:  OLE ONEIDA HOLTS, MD        Coronary artery disease          S/p stent to RCA in 2004 LHC 06/2011: pRCA stent patent, PDA 60 Myoview  4/22: low risk  Persistent atrial fibrillation Failed DCCV x 2 and failed Amiodarone  S/p PVI ablation in 2/21  Mild Aortic Insufficiency  Echocardiogram 08/05/20: EF 55-60, no RWMA, normal RVSF, mild AI, moderate to severe dilation of aortic root (51 mm), dilated ascending aorta (50 mm)  TTE 12/24/2022: EF 60-65, no RWMA, GR 2 DD, severe LVE, LVIDd 7.0 cm, normal RVSF, moderate RVE, mild MR, moderate AI, aortic root 53 mm, ascending aorta 52 mm Bradycardia; no beta-blocker  Right Bundle Branch Block  Hypertension  Hyperlipidemia  Intol of statins  Ascending thoracic aortic aneurysm  CT 03/28/23: 4.9 cm PVCs GERD         Discussed the use of AI scribe software for clinical note transcription with the patient, who gave verbal consent to proceed. History of Present Illness Ronald [name] is a 83 y.o. male for follow up after a visit to the ED. He was last seen by Dr. Cash in 10/2023. He went to the ED on 02/20/24 with shortness of breath, chest pain, palpitations.  EKG demonstrated right bundle branch block and left anterior fascicular block which is similar to what has been seen in the past.  BNP was normal.  Hemoglobin was slightly low 11.9.  Chest x-ray showed no acute disease creatinine was slightly elevated at 1.5.  Troponins were negative.  He was set up for outpatient cardiology follow-up.  He has experienced progressively worsening chest pain and shortness of breath over the past couple of months. The chest discomfort is described as pressure in the middle of the chest, occurring both with exertion and at rest, and is sometimes  accompanied by palpitations. These symptoms are reminiscent of his previous angina prior to his percutaneous coronary intervention (PCI). He has not used nitroglycerin  to relieve these symptoms. He reports chronic swelling in his left leg, which has been present for years. No fever, chills, cough, vomiting, diarrhea, or pain on deep breathing. He does not smoke.    ROS-See HPI    Studies Reviewed:       Labs 03/11/23: ALT 20, TSH 3.19 02/20/2024: K 3.6, creatinine 1.5, eGFR 46, magnesium  2.2, Hgb 11.9, PLT 225K  02/20/2024: hsTrop 11 -12; BNP 62.8  EKG  02/20/24: NSR, RBBB, LAFB   Risk Assessment/Calculations: CHA2DS2-VASc Score = 4   This indicates a 4.8% annual risk of stroke. The patient's score is based upon: CHF History: 0 HTN History: 1 Diabetes History: 0 Stroke History: 0 Vascular Disease History: 1 Age Score: 2 Gender Score: 0            Physical Exam:  VS:  BP 134/76   Pulse 76   Ht 6' 7 (2.007 m)   Wt 268 lb (121.6 kg)   SpO2 96%   BMI 30.19 kg/m        Wt Readings from Last 3 Encounters:  02/24/24 268 lb (121.6 kg)  02/20/24 265 lb (120.2 kg)  10/21/23 264 lb 9.6 oz (120 kg)    Constitutional:      Appearance: Healthy appearance. Not in distress.  Neck:  Vascular: JVD normal.  Pulmonary:     Breath sounds: Normal breath sounds. No wheezing. No rales.  Cardiovascular:     Normal rate. Regular rhythm.     Murmurs: There is no murmur.  Edema:    Peripheral edema absent.  Abdominal:     Palpations: Abdomen is soft.       Assessment and Plan:    Assessment & Plan Precordial chest pain Shortness of breath Coronary artery disease involving native coronary artery of native heart with angina pectoris History of prior PCI to the RCA.  Cardiac catheterization 2013 with patent RCA stent.  Myoview  in 2022  low risk. Recent visit to the ED with chest pain, shortness of breath, palpitations. Troponins were neg. EKG showed NSR. CXR, BNP were normal.   He is describing progressive symptoms over the past couple of months, including exertional and nocturnal chest discomfort and dyspnea. Symptoms reminiscent of previous angina prior to PCI.  I have recommended proceeding with cardiac catheterization.  I reviewed this with Dr. Verlin who agreed.  Patient does not take any PDE-5 inhibitors. - Arrange cardiac catheterization this week - Start isosorbide  mononitrate 30 mg daily - Continue amlodipine  5 mg daily - Continue pravastatin 10 mg daily - Continue nitroglycerin  as needed for chest pain - Hold Eliquis  for 2 days before catheterization - Follow up post cardiac catheterization  Persistent atrial fibrillation (HCC) S/p PVI ablation in 2021. Currently on Eliquis  for anticoagulation. Recent creatinine level at 1.5, which may necessitate adjustment of Eliquis  dosage if it remains elevated. - Continue Eliquis  5 mg twice daily - If creatinine consistently 1.5 or greater, will decrease Eliquis  to 2.5 mg twice daily - Continue amiodarone  200 mg daily Nonrheumatic aortic valve insufficiency Mod AI by TTE in 12/2022. Of note, LV was severely dilated at that time. 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  He is followed by CT surgery.      Informed Consent   Shared Decision Making/Informed Consent The risks [stroke (1 in 1000), death (1 in 1000), kidney failure [usually temporary] (1 in 500), bleeding (1 in 200), allergic reaction [possibly serious] (1 in 200)], benefits (diagnostic support and management of coronary artery disease) and alternatives of a cardiac catheterization were discussed in detail with [name] and he is willing to proceed.     Dispo:  Return for Post Procedure Follow Up.  Signed, Glendia Ferrier, PA-C

## 2024-02-24 ENCOUNTER — Encounter: Payer: Self-pay | Admitting: Physician Assistant

## 2024-02-24 ENCOUNTER — Ambulatory Visit: Attending: Physician Assistant | Admitting: Physician Assistant

## 2024-02-24 VITALS — BP 134/76 | HR 76 | Ht 79.0 in | Wt 268.0 lb

## 2024-02-24 DIAGNOSIS — R0602 Shortness of breath: Secondary | ICD-10-CM

## 2024-02-24 DIAGNOSIS — I7121 Aneurysm of the ascending aorta, without rupture: Secondary | ICD-10-CM

## 2024-02-24 DIAGNOSIS — I351 Nonrheumatic aortic (valve) insufficiency: Secondary | ICD-10-CM

## 2024-02-24 DIAGNOSIS — I4819 Other persistent atrial fibrillation: Secondary | ICD-10-CM | POA: Diagnosis not present

## 2024-02-24 DIAGNOSIS — I2510 Atherosclerotic heart disease of native coronary artery without angina pectoris: Secondary | ICD-10-CM

## 2024-02-24 DIAGNOSIS — I25119 Atherosclerotic heart disease of native coronary artery with unspecified angina pectoris: Secondary | ICD-10-CM

## 2024-02-24 DIAGNOSIS — R072 Precordial pain: Secondary | ICD-10-CM | POA: Diagnosis not present

## 2024-02-24 LAB — CBC

## 2024-02-24 MED ORDER — ISOSORBIDE MONONITRATE ER 30 MG PO TB24: 30.0000 mg | ORAL_TABLET | Freq: Every day | ORAL | 11 refills | Status: DC

## 2024-02-24 NOTE — Assessment & Plan Note (Signed)
 CT 03/2023 4.9 cm.  He is followed by CT surgery.

## 2024-02-24 NOTE — Patient Instructions (Addendum)
 Medication Instructions:  START taking Isosorbide mononitrate 30 mg, take one (1) tablet by mouth once daily. *If you need a refill on your cardiac medications before your next appointment, please call your pharmacy*  Lab Work: CBC, BMET If you have labs (blood work) drawn today and your tests are completely normal, you will receive your results only by: MyChart Message (if you have MyChart) OR A paper copy in the mail If you have any lab test that is abnormal or we need to change your treatment, we will call you to review the results.  Testing/Procedures: Left Heart Cath (see instructions below)  Follow-up ECHOCARDIOGRAM  Follow-Up: At [facility], you and your health needs are our priority.  As part of our continuing mission to provide you with exceptional heart care, our providers are all part of one team.  This team includes your primary Cardiologist (physician) and Advanced Practice Providers or APPs (Physician Assistants and Nurse Practitioners) who all work together to provide you with the care you need, when you need it.  Your next appointment:   2 week(s) after Post-Cath (around Dec. 22nd)   Provider:   [name], MD or Glendia Ferrier, PA-C   We recommend signing up for the patient portal called MyChart.  Sign up information is provided on this After Visit Summary.  MyChart is used to connect with patients for Virtual Visits (Telemedicine).  Patients are able to view lab/test results, encounter notes, upcoming appointments, etc.  Non-urgent messages can be sent to your provider as well.   To learn more about what you can do with MyChart, go to [email or web address].         Cardiac/Peripheral Catheterization   You are scheduled for a Cardiac Catheterization on Monday, December 8 with Dr. [name].  1. Please arrive at the [facility] (Main Entrance A) at [employer]: [address] [place], [place] [address] at 8:30 AM (This  time is 2 hour(s) before your procedure to ensure your preparation).   Free valet parking service is available. You will check in at ADMITTING. The support person will be asked to wait in the waiting room.  It is OK to have someone drop you off and come back when you are ready to be discharged.        Special note: Every effort is made to have your procedure done on time. Please understand that emergencies sometimes delay scheduled procedures.  2. Diet: Nothing to eat after midnight.  3. Hydration:You need to be well hydrated before your procedure. On December 8, you may drink approved liquids (see below) until 2 hours before the procedure, with 16 oz of water as your last intake.   List of approved liquids water, clear juice, clear tea, black coffee, fruit juices, non-citric and without pulp, carbonated beverages, Gatorade, Kool -Aid, plain Jello-O and plain ice popsicles.  4. Labs: You will need to have blood drawn today (11/17).  5. Medication instructions in preparation for your procedure:   Stop taking Eliquis  (Apixiban) on Friday, December 5.  Stop taking, Benicar (Olmesartan) Monday, December 8,    On the morning of your procedure, take Aspirin  81 mg and any morning medicines NOT listed above.  You may use sips of water.  6. Plan to go home the same day, you will only stay overnight if medically necessary. 7. You MUST have a responsible adult to drive you home. 8. An adult MUST be with you the first 24 hours after you arrive home.  9. Bring a current list of your medications, and the last time and date medication taken. 10. Bring ID and current insurance cards. 11.Please wear clothes that are easy to get on and off and wear slip-on shoes.  Thank you for allowing us  to care for you!   -- [facility]

## 2024-02-25 ENCOUNTER — Ambulatory Visit: Payer: Self-pay | Admitting: Physician Assistant

## 2024-02-25 DIAGNOSIS — I25119 Atherosclerotic heart disease of native coronary artery with unspecified angina pectoris: Secondary | ICD-10-CM

## 2024-02-25 DIAGNOSIS — I7121 Aneurysm of the ascending aorta, without rupture: Secondary | ICD-10-CM

## 2024-02-25 DIAGNOSIS — I351 Nonrheumatic aortic (valve) insufficiency: Secondary | ICD-10-CM

## 2024-02-25 LAB — CBC
Hematocrit: 39.3 % (ref 37.5–51.0)
Hemoglobin: 12.4 g/dL — AB (ref 13.0–17.7)
MCH: 27.3 pg (ref 26.6–33.0)
MCHC: 31.6 g/dL (ref 31.5–35.7)
MCV: 86 fL (ref 79–97)
Platelets: 250 x10E3/uL (ref 150–450)
RBC: 4.55 x10E6/uL (ref 4.14–5.80)
RDW: 13 % (ref 11.6–15.4)
WBC: 6.1 x10E3/uL (ref 3.4–10.8)

## 2024-02-25 LAB — BASIC METABOLIC PANEL WITH GFR
BUN/Creatinine Ratio: 17 (ref 10–24)
BUN: 20 mg/dL (ref 8–27)
CO2: 25 mmol/L (ref 20–29)
Calcium: 9 mg/dL (ref 8.6–10.2)
Chloride: 104 mmol/L (ref 96–106)
Creatinine, Ser: 1.19 mg/dL (ref 0.76–1.27)
Glucose: 112 mg/dL — ABNORMAL HIGH (ref 70–99)
Potassium: 4 mmol/L (ref 3.5–5.2)
Sodium: 142 mmol/L (ref 134–144)
eGFR: 61 mL/min/1.73 (ref >59)

## 2024-02-25 NOTE — Telephone Encounter (Signed)
 Spoke with pt regarding lab results. Pt verbalized understanding. Pt was advise to go to [facility], Main Entrance A, at 8:30 am on 12/8 for his procedure. Pt agreed and had no further questions.

## 2024-03-12 ENCOUNTER — Telehealth: Payer: Self-pay | Admitting: *Deleted

## 2024-03-12 ENCOUNTER — Ambulatory Visit (HOSPITAL_COMMUNITY): Admission: RE | Admit: 2024-03-12 | Discharge: 2024-03-12 | Disposition: A | Source: Ambulatory Visit

## 2024-03-12 DIAGNOSIS — I7121 Aneurysm of the ascending aorta, without rupture: Secondary | ICD-10-CM | POA: Diagnosis not present

## 2024-03-12 DIAGNOSIS — D3501 Benign neoplasm of right adrenal gland: Secondary | ICD-10-CM | POA: Diagnosis not present

## 2024-03-12 NOTE — Telephone Encounter (Signed)
 Cardiac Catheterization scheduled at [facility] for: Monday March 16, 2024 10:30 AM Arrival time [facility] Main Entrance A at: 8:30 AM  Diet: -Nothing to eat after midnight.  Hydration: -May drink clear liquids until 2 hours before the procedure.  Approved liquids: Water, clear tea, black coffee, fruit juices-non-citric and without pulp,Gatorade, plain Jello/popsicles.   -Please drink 16 oz of water 2 hours before procedure.  Medication instructions: -Hold:  Eliquis -none 03/14/24 until post procedure  Chlorthalidone /KCl-AM of procedure -Other usual morning medications can be taken including aspirin  81 mg.  Plan to go home the same day, you will only stay overnight if medically necessary.  You must have responsible adult to drive you home.  Someone must be with you the first 24 hours after you arrive home.  Reviewed procedure instructions with patient.

## 2024-03-16 ENCOUNTER — Ambulatory Visit (HOSPITAL_COMMUNITY)
Admission: RE | Admit: 2024-03-16 | Discharge: 2024-03-16 | Disposition: A | Discharge status: Home / Self Care | Attending: Cardiovascular Disease | Admitting: Cardiovascular Disease

## 2024-03-16 ENCOUNTER — Ambulatory Visit (HOSPITAL_COMMUNITY)
Admission: RE | Disposition: A | Discharge status: Home / Self Care | Payer: Self-pay | Source: Home / Self Care | Attending: Cardiovascular Disease

## 2024-03-16 ENCOUNTER — Other Ambulatory Visit: Payer: Self-pay

## 2024-03-16 ENCOUNTER — Encounter (HOSPITAL_COMMUNITY): Payer: Self-pay | Admitting: Cardiovascular Disease

## 2024-03-16 DIAGNOSIS — R0602 Shortness of breath: Secondary | ICD-10-CM

## 2024-03-16 DIAGNOSIS — I25118 Atherosclerotic heart disease of native coronary artery with other forms of angina pectoris: Secondary | ICD-10-CM

## 2024-03-16 DIAGNOSIS — R072 Precordial pain: Secondary | ICD-10-CM

## 2024-03-16 DIAGNOSIS — I25119 Atherosclerotic heart disease of native coronary artery with unspecified angina pectoris: Secondary | ICD-10-CM

## 2024-03-16 HISTORY — PX: LEFT HEART CATH AND CORONARY ANGIOGRAPHY: CATH118249

## 2024-03-16 SURGERY — LEFT HEART CATH AND CORONARY ANGIOGRAPHY
Anesthesia: LOCAL

## 2024-03-16 MED ORDER — ONDANSETRON HCL 4 MG/2ML IJ SOLN: 4.0000 mg | Freq: Four times a day (QID) | INTRAMUSCULAR | Status: DC | PRN

## 2024-03-16 MED ORDER — MIDAZOLAM HCL (PF) 2 MG/2ML IJ SOLN
INTRAMUSCULAR | Status: DC | PRN
  Administered 2024-03-16: 1 mg via INTRAVENOUS

## 2024-03-16 MED ORDER — HEPARIN (PORCINE) IN NACL 1000-0.9 UT/500ML-% IV SOLN
INTRAVENOUS | Status: DC | PRN
  Administered 2024-03-16: 1000 mL via SURGICAL_CAVITY

## 2024-03-16 MED ORDER — VERAPAMIL HCL 2.5 MG/ML IV SOLN
INTRAVENOUS | Status: AC
  Filled 2024-03-16: qty 2

## 2024-03-16 MED ORDER — LABETALOL HCL 5 MG/ML IV SOLN: 10.0000 mg | INTRAVENOUS | Status: DC | PRN

## 2024-03-16 MED ORDER — IOHEXOL 350 MG/ML SOLN
INTRAVENOUS | Status: DC | PRN
  Administered 2024-03-16: 55 mL via INTRA_ARTERIAL

## 2024-03-16 MED ORDER — ASPIRIN 81 MG PO CHEW
81.0000 mg | CHEWABLE_TABLET | ORAL | Status: AC
  Administered 2024-03-16: 81 mg via ORAL
  Filled 2024-03-16: qty 1

## 2024-03-16 MED ORDER — LIDOCAINE HCL (PF) 1 % IJ SOLN
INTRAMUSCULAR | Status: DC | PRN
  Administered 2024-03-16: 10 mL

## 2024-03-16 MED ORDER — HEPARIN SODIUM (PORCINE) 1000 UNIT/ML IJ SOLN
INTRAMUSCULAR | Status: AC
  Filled 2024-03-16: qty 10

## 2024-03-16 MED ORDER — VERAPAMIL HCL 2.5 MG/ML IV SOLN
INTRAVENOUS | Status: DC | PRN
  Administered 2024-03-16: 10 mL via INTRA_ARTERIAL

## 2024-03-16 MED ORDER — MIDAZOLAM HCL 2 MG/2ML IJ SOLN
INTRAMUSCULAR | Status: AC
  Filled 2024-03-16: qty 2

## 2024-03-16 MED ORDER — FREE WATER: 500.0000 mL | Freq: Once | Status: DC

## 2024-03-16 MED ORDER — SODIUM CHLORIDE 0.9% FLUSH: 3.0000 mL | Freq: Two times a day (BID) | INTRAVENOUS | Status: DC

## 2024-03-16 MED ORDER — LIDOCAINE HCL (PF) 1 % IJ SOLN
INTRAMUSCULAR | Status: AC
  Filled 2024-03-16: qty 30

## 2024-03-16 MED ORDER — LIDOCAINE HCL (PF) 1 % IJ SOLN
INTRAMUSCULAR | Status: DC | PRN
  Administered 2024-03-16: 2 mL

## 2024-03-16 MED ORDER — FENTANYL CITRATE (PF) 100 MCG/2ML IJ SOLN
INTRAMUSCULAR | Status: DC | PRN
  Administered 2024-03-16: 25 ug via INTRAVENOUS

## 2024-03-16 MED ORDER — HYDRALAZINE HCL 20 MG/ML IJ SOLN
10.0000 mg | INTRAMUSCULAR | Status: DC | PRN
  Administered 2024-03-16: 10 mg via INTRAVENOUS

## 2024-03-16 MED ORDER — SODIUM CHLORIDE 0.9% FLUSH: 3.0000 mL | INTRAVENOUS | Status: DC | PRN

## 2024-03-16 MED ORDER — SODIUM CHLORIDE 0.9 % IV SOLN: 250.0000 mL | INTRAVENOUS | Status: DC | PRN

## 2024-03-16 MED ORDER — ACETAMINOPHEN 325 MG PO TABS: 650.0000 mg | ORAL_TABLET | ORAL | Status: DC | PRN

## 2024-03-16 MED ORDER — FENTANYL CITRATE (PF) 100 MCG/2ML IJ SOLN
INTRAMUSCULAR | Status: AC
  Filled 2024-03-16: qty 2

## 2024-03-16 SURGICAL SUPPLY — 12 items
CATH 5FR JL3.5 JR4 ANG PIG MP (CATHETERS) IMPLANT
CATH INFINITI 5FR JL5 (CATHETERS) IMPLANT
CLOSURE MYNX CONTROL 5F (Vascular Products) IMPLANT
DEVICE RAD COMP TR BAND LRG (VASCULAR PRODUCTS) IMPLANT
GLIDESHEATH SLEND SS 6F .021 (SHEATH) IMPLANT
GUIDEWIRE INQWIRE 1.5J.035X260 (WIRE) IMPLANT
KIT MICROPUNCTURE NIT STIFF (SHEATH) IMPLANT
PACK CARDIAC CATHETERIZATION (CUSTOM PROCEDURE TRAY) ×1 IMPLANT
SET ATX-X65L (MISCELLANEOUS) IMPLANT
SHEATH PINNACLE 5F 10CM (SHEATH) IMPLANT
SHEATH PROBE COVER 6X72 (BAG) IMPLANT
WIRE HI TORQ VERSACORE-J 145CM (WIRE) IMPLANT

## 2024-03-16 NOTE — Interval H&P Note (Signed)
 History and Physical Interval Note:  03/16/2024 8:50 AM  Ronald [name]  has presented today for surgery, with the diagnosis of cad - angina.  The various methods of treatment have been discussed with the patient and family. After consideration of risks, benefits and other options for treatment, the patient has consented to  Procedure(s): LEFT HEART CATH AND CORONARY ANGIOGRAPHY (N/A) as a surgical intervention.  The patient's history has been reviewed, patient examined, no change in status, stable for surgery.  I have reviewed the patient's chart and labs.  Questions were answered to the patient's satisfaction.    Cath Lab Visit (complete for each Cath Lab visit)  Clinical Evaluation Leading to the Procedure:   ACS: No.  Non-ACS:    Anginal Classification: CCS III  Anti-ischemic medical therapy: Minimal Therapy (1 class of medications)  Non-Invasive Test Results: No non-invasive testing performed  Prior CABG: No previous CABG        Ronald [name]

## 2024-03-16 NOTE — Progress Notes (Signed)
 Discharge instructions reviewed with patient, wife, and son at bedside. Denies questions concerns. PT tolerated PO intake. Ambulated in the hallway, was able to void without difficulty. Incision site remains clean dry and intact. No s/s of complications. PT escorted from the unit via wheel chair to personal vehicle.

## 2024-03-16 NOTE — Discharge Instructions (Addendum)
 Resume Eliquis  tomorrow if no bleeding from cath site.    Femoral Site Care The following information offers guidance on how to care for yourself after your procedure. Your health care provider may also give you more specific instructions. If you have problems or questions, contact your health care provider. What can I expect after the procedure? After the procedure, it is common to have bruising and tenderness at the incision site. This usually fades within 1-2 weeks. Follow these instructions at home: Incision site care  Follow instructions from your health care provider about how to take care of your incision site. Make sure you: Wash your hands with soap and water  for at least 20 seconds before and after you change your bandage (dressing). If soap and water  are not available, use hand sanitizer. Remove your dressing in 24 hours. Leave stitches (sutures), skin glue, or adhesive strips in place. These skin closures may need to stay in place for 2 weeks or longer. If adhesive strip edges start to loosen and curl up, you may trim the loose edges. Do not remove adhesive strips completely unless your health care provider tells you to do that. Do not take baths, swim, or use a hot tub for at least 1 week. You may shower 24 hours after the procedure or as told by your health care provider. Gently wash the incision site with plain soap and water . Pat the area dry with a clean towel. Do not rub the site. This may cause bleeding. Do not apply powder or lotion to the site. Keep the site clean and dry. Check your femoral site every day for signs of infection. Check for: Redness, swelling, or pain. Fluid or blood. Warmth. Pus or a bad smell. Activity If you were given a sedative during the procedure, it can affect you for several hours. Do not drive or operate machinery until your health care provider says that it is safe. Rest as told by your health care provider. Avoid sitting for a long time  without moving. Get up to take short walks every 1-2 hours. This is important to improve blood flow and breathing. Ask for help if you feel weak or unsteady. Return to your normal activities as told by your health care provider. Ask your health care provider what activities are safe for you and when you can return to work. Avoid activities that take a lot of effort for the first 2-3 days after your procedure, or as long as directed. Do not lift anything that is heavier than 10 lb (4.5 kg), or the limit that you are told, until your health care provider says that it is safe. General instructions Take over-the-counter and prescription medicines only as told by your health care provider. If you will be going home right after the procedure, plan to have a responsible adult care for you for the time you are told. This is important. Keep all follow-up visits. This is important. Contact a health care provider if: You have a fever or chills. You have any of these signs of infection at your incision site: Redness, swelling, or pain. Fluid or blood. Warmth. Pus or a bad smell. Get help right away if: The incision area swells very fast. The incision area is bleeding, and the bleeding does not stop when you hold steady pressure on the area. Your leg or foot becomes pale, cool, tingly, or numb. These symptoms may represent a serious problem that is an emergency. Do not wait to see if the symptoms  will go away. Get medical help right away. Call your local emergency services (911 in the [place] Do not drive yourself to the hospital. Summary After the procedure, it is common to have bruising and tenderness that fade within 1-2 weeks. Check your femoral site every day for signs of infection. Do not lift anything that is heavier than 10 lb (4.5 kg), or the limit that you are told, until your health care provider says that it is safe. Get help right away if the incision area swells very fast, you have bleeding  at the incision area that does not stop, or your leg or foot becomes pale, cool, or numb. This information is not intended to replace advice given to you by your health care provider. Make sure you discuss any questions you have with your health care provider. Document Revised: 12/14/2020 Document Reviewed: 05/15/2020 Elsevier Patient Education  2024 [employer].

## 2024-03-17 ENCOUNTER — Other Ambulatory Visit: Payer: Self-pay

## 2024-03-17 MED ORDER — FUROSEMIDE 20 MG PO TABS: 20.0000 mg | ORAL_TABLET | Freq: Every day | ORAL | 3 refills | Status: AC

## 2024-03-17 NOTE — Progress Notes (Signed)
-----   Message -----  From: Verlin Lonni BIRCH, MD  Sent: 03/16/2024   1:42 PM EST  To: [facility] Triage   Can someone help me send Lasix  20 mg daily to his pharmacy. [facility]). Thank you. I appreciate it. [place]

## 2024-03-18 MED FILL — Heparin Sodium (Porcine) Inj 1000 Unit/ML: INTRAMUSCULAR | Qty: 10 | Status: AC

## 2024-03-23 NOTE — Progress Notes (Unsigned)
°  Electrophysiology Office Follow up Visit Note:    Date:  03/26/2024   ID:  Ronald [name], DOB 01/19/1941, MRN [id number]  PCP:  Ronald Anes, MD  [facility] Cardiologist:  Ronald Cash, MD  [facility] Electrophysiologist:  Ronald ONEIDA HOLTS, MD    Interval History:     Ronald [name] is a 83 y.o. male who presents for a follow up visit.   The patient saw Ronald [name] 12, 2024.  The patient has a history of coronary disease, hypertension, hyperlipidemia, Bell's palsy, atrial fibrillation and aortic aneurysm.  He is on Eliquis  for stroke prophylaxis.  He takes amiodarone  for rhythm control.  He reports stable shortness of breath.  He had a recent heart catheterization showing no severe stenotic lesions.  Has an echo scheduled for next week.  He remains in normal sinus rhythm.      Past medical, surgical, social and family history were reviewed.  ROS:   Please see the history of present illness.    All other systems reviewed and are negative.  EKGs/Labs/Other Studies Reviewed:    The following studies were reviewed today:     EKG Interpretation Date/Time:  Thursday [name] 18 2025 11:31:02 EST Ventricular Rate:  64 PR Interval:  198 QRS Duration:  174 QT Interval:  486 QTC Calculation: 501 R Axis:   -74  Text Interpretation: Normal sinus rhythm Right bundle branch block Left anterior fascicular block Confirmed by [name] Ronald [id number]) on 03/26/2024 11:38:33 AM    Physical Exam:    VS:  BP 120/72 (BP Location: Left Arm, Patient Position: Sitting, Cuff Size: Large)   Pulse 64   Ht 6' 7 (2.007 m)   Wt 280 lb (127 kg)   SpO2 95%   BMI 31.54 kg/m     Wt Readings from Last 3 Encounters:  03/26/24 280 lb (127 kg)  03/16/24 270 lb (122.5 kg)  02/24/24 268 lb (121.6 kg)     GEN: no distress CARD: RRR, No MRG RESP: No IWOB. CTAB.      ASSESSMENT:    1. Persistent atrial fibrillation (HCC)   2. Encounter for long-term (current) use  of high-risk medication    PLAN:    In order of problems listed above:  #Persistent atrial fibrillation #High risk medication monitoring-amiodarone  Maintaining sinus rhythm on amiodarone  Update CMP, TSH and free T4 today Continue Eliquis  for stroke prophylaxis  #Hypertension At goal today.  Recommend checking blood pressures 1-2 times per week at home and recording the values.  Recommend bringing these recordings to the primary care physician.  I discussed my upcoming departure from Ronald [name] during today's clinic appointment.  The patient will continue to follow-up with one of my EP partners moving forward.  Follow-up with EP APP in 6 months  Signed, Ronald Holts, MD, [facility], [facility] 03/26/2024 11:46 AM    Electrophysiology [facility]

## 2024-03-26 ENCOUNTER — Ambulatory Visit

## 2024-03-26 ENCOUNTER — Ambulatory Visit: Attending: Cardiovascular Disease | Admitting: Cardiology

## 2024-03-26 ENCOUNTER — Other Ambulatory Visit: Payer: Self-pay

## 2024-03-26 ENCOUNTER — Encounter: Payer: Self-pay | Admitting: Cardiology

## 2024-03-26 VITALS — BP 120/72 | HR 64 | Ht 79.0 in | Wt 280.0 lb

## 2024-03-26 DIAGNOSIS — I4819 Other persistent atrial fibrillation: Secondary | ICD-10-CM

## 2024-03-26 DIAGNOSIS — Z79899 Other long term (current) drug therapy: Secondary | ICD-10-CM

## 2024-03-26 NOTE — Patient Instructions (Signed)
 Medication Instructions:  Your physician recommends that you continue on your current medications as directed. Please refer to the Current Medication list given to you today.  *If you need a refill on your cardiac medications before your next appointment, please call your pharmacy*  Lab Work: TODAY: CMET, TSH, T4  Follow-Up: At [facility], you and your health needs are our priority.  As part of our continuing mission to provide you with exceptional heart care, our providers are all part of one team.  This team includes your primary Cardiologist (physician) and Advanced Practice Providers or APPs (Physician Assistants and Nurse Practitioners) who all work together to provide you with the care you need, when you need it.  Your next appointment:   6 months  Provider:   You will see one of the following Advanced Practice Providers on your designated Care Team:   Charlies Arthur, [place] Ozell Jodie Passey, PA-C Suzann Riddle, NP Daphne Barrack, NP Artist Pouch, PA-C

## 2024-03-27 ENCOUNTER — Ambulatory Visit

## 2024-03-27 VITALS — BP 129/63 | HR 73 | Resp 20 | Wt 278.6 lb

## 2024-03-27 DIAGNOSIS — I7121 Aneurysm of the ascending aorta, without rupture: Secondary | ICD-10-CM

## 2024-03-27 LAB — COMPREHENSIVE METABOLIC PANEL WITH GFR
ALT: 16 IU/L (ref 0–44)
AST: 21 IU/L (ref 0–40)
Albumin: 4.1 g/dL (ref 3.7–4.7)
Alkaline Phosphatase: 83 IU/L (ref 48–129)
BUN/Creatinine Ratio: 19 (ref 10–24)
BUN: 23 mg/dL (ref 8–27)
Bilirubin Total: 0.4 mg/dL (ref 0.0–1.2)
CO2: 26 mmol/L (ref 20–29)
Calcium: 8.9 mg/dL (ref 8.6–10.2)
Chloride: 103 mmol/L (ref 96–106)
Creatinine, Ser: 1.23 mg/dL (ref 0.76–1.27)
Globulin, Total: 2.2 g/dL (ref 1.5–4.5)
Glucose: 111 mg/dL — ABNORMAL HIGH (ref 70–99)
Potassium: 3.9 mmol/L (ref 3.5–5.2)
Sodium: 144 mmol/L (ref 134–144)
Total Protein: 6.3 g/dL (ref 6.0–8.5)
eGFR: 59 mL/min/1.73 — ABNORMAL LOW

## 2024-03-27 LAB — T4, FREE: Free T4: 1.24 ng/dL (ref 0.82–1.77)

## 2024-03-27 LAB — TSH: TSH: 3.35 u[IU]/mL (ref 0.450–4.500)

## 2024-03-27 NOTE — Progress Notes (Addendum)
 Addendum (04/13/2024): I personally reviewed Mr. Ronald [name] CTA aorta and TTE.  His aortic insufficiency appears stable and his EF is preserved.  His root and ascending thoracic aneurysm are also stable: ascending aorta - 5.0 cm, SOV (trileaflet) R 46.4 mm, L 45.1 mm, N 49.0 mm.  After discussing with cardiology (Dr. Verlin and Glendia Ferrier, PA), we've agreed that AI is not the source of his dyspnea, and that he should benefit from increased diuresis.  I spoke with Mr. Ronald [name] on the phone. He says he is still short of breath but feeling ok these last couple days.  Cardiology to discuss medication adjustments with the patient.  At this time, no need for surgical intervention.  Will plan to repeat CTA aorta and TTE in one year.  Plan discussed with the patient and cardiology.    [address], [place] [address]             [phone]    MEKEL HAVERSTOCK [facility] Medical Record #[id number] Date of Birth: 11/11/1940  Referring: Verlin Lonni BIRCH, MD Primary Care: Shayne Anes, MD Primary Cardiologist:Christopher Verlin, MD  Chief Complaint:    Chief Complaint  Patient presents with   Thoracic Aortic Aneurysm    Yearly f/u with CT chest    History of Present Illness:     Ronald [name] is a 83 y.o. male who presents for surgical evaluation of thoracic ascending aortic aneurysm.  He was last seen by Dr. Maryjane on 03/28/23. At that time the plan was to return in 1 year for a repeat CT scan.    Since then, he has been feeling ok aside from some progressively worsening shortness of breath. Used to be active (7-8 months ago) would exercise at the [facility], however his wife had foot surgery so he has not been going to the gym anymore.  He wants to get back to exercising.  He has shortness of breath though that he thinks may be attributed to deconditioning and weight gain.  He has had a LHC and a repeat ECHO is pending.  On his previous ECHO, he had moderate AI with a  severely dilated LV.  Former smoker, off and on for 3 years while in the [employer].  He lives in a house with his wife and is completely independent in his ADLs.  No family  history of connective tissue disease.  His father died in 6 of something heart related. Mother died in her 28s of lung cancer.  Two sisters - no known aortic issues.    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 LVEDP 21 mmHg TTE (12/24/22): LVEF 60-65% with severely dilated LV and normal RV function.  Mild MR, no MS. Moderate AI, no AS Chest CT non-contrast (03/12/24): Ascending aorta 4.9 cm, sinuses 5.1 cm   Past Medical and Surgical History: Previous Chest Surgery: No Previous Chest Radiation: No Diabetes Mellitus: No.  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    Lung nodules    noted on past CT scans   Malaria    Paroxysmal atrial flutter (HCC)    Persistent atrial fibrillation (HCC)    SVT (supraventricular tachycardia)    remote episode during exercise test   Thoracic aortic aneurysm    last scan in Jan 2013. Now measuring 5.0cm. Referred to Dr. Army   Wears dentures    bottom   Wears glasses     Past Surgical History:  Procedure Laterality Date   ATRIAL FIBRILLATION ABLATION N/A 05/12/2019   Procedure: ATRIAL FIBRILLATION ABLATION;  Surgeon: Kelsie Agent, MD;  Location: [facility] INVASIVE CV LAB;  Service:  Cardiovascular;  Laterality: N/A;   CARDIOVERSION N/A 03/13/2019   Procedure: CARDIOVERSION;  Surgeon: Okey Vina GAILS, MD;  Location: [facility] ENDOSCOPY;  Service: Cardiovascular;  Laterality: N/A;   CARDIOVERSION N/A 04/15/2019   Procedure: CARDIOVERSION;  Surgeon: Alveta Aleene PARAS, MD;  Location: [facility] ENDOSCOPY;  Service: Cardiovascular;  Laterality: N/A;   CARDIOVERSION N/A 06/16/2019   Procedure: CARDIOVERSION;  Surgeon: Raford Riggs, MD;  Location: [facility] ENDOSCOPY;  Service: Cardiovascular;  Laterality: N/A;   CERVICAL FUSION  03-2010   c2-c7   COLONOSCOPY     x4   CORONARY STENT PLACEMENT  2004   dental implant     x 2   ELBOW SURGERY  1997   lt and rt   EYE SURGERY     HIP PINNING,CANNULATED Right 05/11/2014   Procedure: CANNULATED HIP PINNING;  Surgeon: Evalene JONETTA Chancy, MD;  Location: [facility] OR;  Service: Orthopedics;  Laterality: Right;   LEFT HEART CATH AND CORONARY ANGIOGRAPHY N/A 03/16/2024   Procedure: LEFT HEART CATH AND CORONARY ANGIOGRAPHY;  Surgeon: Verlin Lonni JONETTA, MD;  Location: [facility] INVASIVE CV LAB;  Service: Cardiovascular;  Laterality: N/A;   RETINAL DETACHMENT SURGERY  1975   lt   rotator cuff surgery  1997   lt   SHOULDER ARTHROSCOPY  2/15   right-got cardiac clearance-[facility]   TONSILLECTOMY     TRIGGER FINGER RELEASE Left 10/12/2013   Procedure: LEFT LONG FINGER TRIGGER RELEASE;  Surgeon: Alm DELENA Hummer, MD;  Location: [facility];  Service: Orthopedics;  Laterality: Left;    Social History:  Tobacco Use History[1]  Social History   Substance and Sexual Activity  Alcohol  Use Yes   Alcohol /week: 1.0 standard drink of alcohol    Types: 1 Standard drinks or equivalent per week   Comment: occasional mainly during holiday     Allergies[2]  Current Outpatient Medications  Medication Sig Dispense Refill   amiodarone  (PACERONE ) 200 MG tablet Take 1 tablet (200 mg total) by mouth daily. 90 tablet 1   amLODipine  (NORVASC ) 5 MG tablet Take 1 tablet (5 mg  total) by mouth daily. (Patient taking differently: Take 2.5 mg by mouth daily.) 30 tablet 2   apixaban  (ELIQUIS ) 5 MG TABS tablet Take 1 tablet (5 mg total) by mouth 2 (two) times daily. 60 tablet 5   blood glucose meter kit and supplies KIT Dispense based on patient and insurance preference. Use up to four times daily as directed. 1 each 0   chlorthalidone  (HYGROTON ) 25 MG tablet Take 25 mg by mouth every morning.     clobetasol ointment (TEMOVATE) 0.05 % Apply 1 application  topically 2 (two) times daily as needed (itchy scaly on legs).     Coenzyme Q10 (COQ-10) 100 MG CAPS Take 100 mg by mouth every morning.     dicyclomine (BENTYL) 10 MG capsule Take 10 mg by mouth 2 (two) times daily.     docusate sodium  (COLACE) 100 MG capsule Take 100 mg by mouth daily.     famotidine (PEPCID) 40 MG tablet Take 40 mg by mouth 2 (two) times daily.     fluticasone (FLONASE) 50 MCG/ACT nasal spray Place 1 spray into both nostrils daily as needed for allergies (sinus congestion).     furosemide  (LASIX ) 20 MG tablet Take 1 tablet (20 mg total) by mouth daily. 90 tablet 3   gabapentin (NEURONTIN) 300 MG capsule Take 300 mg by mouth daily.     Glucosamine HCl 1500 MG TABS Take 3,000 mg by mouth daily. Bio-Flex     hydrocortisone  2.5 % cream Apply 1 application  topically as needed (eye brows flakey). Use on head     isosorbide  mononitrate (IMDUR ) 30 MG 24 hr tablet Take 1 tablet (30 mg total) by mouth daily. 30 tablet 11   Ivermectin (SOOLANTRA EX) Apply 1 application  topically daily as needed (Rosacea).     ketoconazole (NIZORAL) 2 % cream Apply 1 application  topically daily as needed for irritation (Corner of mouth (sores)).     Multiple Vitamin (MULTIVITAMIN WITH MINERALS) TABS tablet Take 1 tablet by mouth daily. Men     mupirocin  ointment (BACTROBAN ) 2 % Apply 1 application  topically daily as needed (tick bites).     nitroGLYCERIN  (NITROSTAT ) 0.4 MG SL tablet Place 1 tablet (0.4 mg total) under the tongue  every 5 (five) minutes as needed for chest pain. 25 tablet 1   olmesartan (BENICAR) 20 MG tablet Take 20 mg by mouth daily.     Pitavastatin Calcium 2 MG TABS Take 2 mg by mouth daily.     potassium chloride  SA (KLOR-CON  M) 20 MEQ tablet Take 2 tablets (40 mEq total) by mouth daily. 180 tablet 3   sucralfate (CARAFATE) 1 g tablet Take 1 g by mouth 3 (three) times daily.     No current facility-administered medications for this visit.    (Not in a hospital admission)   Family History  Problem Relation Age of Onset   Heart disease Father    Heart attack Father        47   Cancer Mother 67       LUNG   Hypertension Neg Hx    Stroke Neg Hx      Review of Systems:   Review of Systems  Constitutional:  Positive for malaise/fatigue. Negative for weight loss.  Respiratory:  Positive for shortness of breath. Negative for cough.   Cardiovascular:  Positive for leg swelling. Negative for chest pain and palpitations.  Gastrointestinal:  Negative for nausea and vomiting.  Neurological:  Negative for dizziness and headaches.      Physical Exam: BP 129/63 (BP Location: Left Arm, Patient Position: Sitting, Cuff Size: Normal)   Pulse 73   Resp 20   Wt 278 lb 9.6 oz (126.4 kg)   SpO2 92% Comment: RA  BMI 31.39 kg/m  Physical Exam Constitutional:      Appearance: Normal appearance.  Cardiovascular:     Rate and Rhythm: Normal rate and regular rhythm.     Heart sounds: Murmur (Very faint murmur) heard.  Pulmonary:     Effort: Pulmonary effort is  normal.     Breath sounds: Normal breath sounds.  Abdominal:     General: There is no distension.     Palpations: Abdomen is soft.  Musculoskeletal:     Right lower leg: Edema present.     Left lower leg: Edema present.     Comments: LLE > RLE  Neurological:     General: No focal deficit present.     Mental Status: He is alert and oriented to person, place, and time.     Diagnostic Studies & Laboratory data: Cardiac Studies &  Procedures   ______________________________________________________________________________________________ CARDIAC CATHETERIZATION  CARDIAC CATHETERIZATION 03/16/2024  Conclusion   Prox RCA-1 lesion is 10% stenosed.   Prox RCA-2 lesion is 30% stenosed.   RPDA lesion is 60% stenosed.   Mid Cx lesion is 50% stenosed.   Mid LAD lesion is 50% stenosed.  Mild to moderate non-obstructive disease in the mid LAD Moderate non-obstructive disease in the mid Circumflex Large intermediate branch with mild non-obstructive disease Large dominant RCA with patent proximal stent with minimal restenosis. The mid RCA has a focal mild to moderate stenosis. The PDA has a moderate stenosis. Unchanged from last cath in 2013. LVEDP 21 mmHg  Recommendations: Continue medical management of CAD. Will add Lasix  20 mg daily to use for one week and then as needed. (Elevated LV filling pressure).  Findings Coronary Findings Diagnostic  Dominance: Right  Left Anterior Descending Mid LAD lesion is 50% stenosed.  Left Circumflex Vessel is large. Mid Cx lesion is 50% stenosed.  Right Coronary Artery Vessel is large. Prox RCA-1 lesion is 10% stenosed. The lesion was previously treated using a stent (unknown type) over 2 years ago. Prox RCA-2 lesion is 30% stenosed.  Right Posterior Descending Artery RPDA lesion is 60% stenosed.  Intervention  No interventions have been documented.   STRESS TESTS  MYOCARDIAL PERFUSION IMAGING 07/18/2020  Interpretation Summary  The left ventricular ejection fraction is normal (55-65%).  Nuclear stress EF: 58%.  Blood pressure demonstrated a hypertensive response to exercise.  There was no ST segment deviation noted during stress.  The study is normal.  This is a low risk study.   ECHOCARDIOGRAM  ECHOCARDIOGRAM COMPLETE 12/24/2022  Narrative ECHOCARDIOGRAM REPORT    Patient Name:   Ronald [name] Date of Exam: 12/24/2022 Medical Rec #:  [id number]        Height:       79.0 in Accession #:    [id number]      Weight:       251.0 lb Date of Birth:  1940/10/09       BSA:          2.510 m Patient Age:    82 years        BP:           142/70 mmHg Patient Gender: M               HR:           57 bpm. Exam Location:  [address]  Procedure: 2D Echo, Cardiac Doppler and Color Doppler  Indications:    I77.810 Dilated Aortic Root  History:        Patient has prior history of Echocardiogram examinations, most recent 08/05/2020. CAD, Arrythmias:Tachycardia; Risk Factors:Dyslipidemia and Hypertension.  Sonographer:    Carl Rodgers-Jones RDCS Referring Phys: [id number] TESSA N CONTE  IMPRESSIONS   1. Left ventricular ejection fraction, by estimation, is 60 to 65%. The left ventricle has normal function.  The left ventricle has no regional wall motion abnormalities. The left ventricular internal cavity size was severely dilated. Left ventricular diastolic parameters are consistent with Grade II diastolic dysfunction (pseudonormalization). 2. Right ventricular systolic function is normal. The right ventricular size is moderately enlarged. 3. The mitral valve is normal in structure. Mild mitral valve regurgitation. No evidence of mitral stenosis. 4. The aortic valve is normal in structure. Aortic valve regurgitation is moderate. No aortic stenosis is present. Aortic regurgitation PHT measures 400 msec. 5. Aortic dilatation noted. There is severe dilatation of the aortic root, measuring 53 mm. There is severe dilatation of the ascending aorta, measuring 52 mm. 6. The inferior vena cava is normal in size with greater than 50% respiratory variability, suggesting right atrial pressure of 3 mmHg.  Comparison(s): Prior 07/2020: Ascending aorta 50 mm.  FINDINGS Left Ventricle: Left ventricular ejection fraction, by estimation, is 60 to 65%. The left ventricle has normal function. The left ventricle has no regional wall motion abnormalities. The left  ventricular internal cavity size was severely dilated. There is no left ventricular hypertrophy. Left ventricular diastolic parameters are consistent with Grade II diastolic dysfunction (pseudonormalization).  Right Ventricle: The right ventricular size is moderately enlarged. No increase in right ventricular wall thickness. Right ventricular systolic function is normal.  Left Atrium: Left atrial size was normal in size.  Right Atrium: Right atrial size was normal in size.  Pericardium: There is no evidence of pericardial effusion.  Mitral Valve: The mitral valve is normal in structure. Mild mitral valve regurgitation. No evidence of mitral valve stenosis.  Tricuspid Valve: The tricuspid valve is normal in structure. Tricuspid valve regurgitation is trivial. No evidence of tricuspid stenosis.  Aortic Valve: The aortic valve is normal in structure. Aortic valve regurgitation is moderate. Aortic regurgitation PHT measures 400 msec. No aortic stenosis is present.  Pulmonic Valve: The pulmonic valve was normal in structure. Pulmonic valve regurgitation is not visualized. No evidence of pulmonic stenosis.  Aorta: Aortic dilatation noted. There is severe dilatation of the aortic root, measuring 53 mm. There is severe dilatation of the ascending aorta, measuring 52 mm.  Venous: The inferior vena cava is normal in size with greater than 50% respiratory variability, suggesting right atrial pressure of 3 mmHg.  IAS/Shunts: No atrial level shunt detected by color flow Doppler.   LEFT VENTRICLE PLAX 2D LVIDd:         7.00 cm   Diastology LVIDs:         4.40 cm   LV e' medial:    4.84 cm/s LV PW:         0.90 cm   LV E/e' medial:  17.3 LV IVS:        0.70 cm   LV e' lateral:   7.46 cm/s LVOT diam:     2.50 cm   LV E/e' lateral: 11.2 LV SV:         128 LV SV Index:   51 LVOT Area:     4.91 cm   RIGHT VENTRICLE             IVC RV Basal diam:  4.80 cm     IVC diam: 2.00 cm RV S prime:      15.85 cm/s TAPSE (M-mode): 3.8 cm  LEFT ATRIUM             Index        RIGHT ATRIUM           Index LA diam:  4.90 cm 1.95 cm/m   RA Area:     18.30 cm LA Vol (A2C):   87.3 ml 34.78 ml/m  RA Volume:   48.70 ml  19.40 ml/m LA Vol (A4C):   71.5 ml 28.49 ml/m LA Biplane Vol: 80.0 ml 31.87 ml/m AORTIC VALVE LVOT Vmax:   109.50 cm/s LVOT Vmean:  73.200 cm/s LVOT VTI:    0.260 m AI PHT:      400 msec  AORTA Ao Root diam: 5.30 cm Ao Asc diam:  5.20 cm  MITRAL VALVE               TRICUSPID VALVE MV Area (PHT): 3.65 cm    TR Peak grad:   27.2 mmHg MV Decel Time: 208 msec    TR Vmax:        261.00 cm/s MV E velocity: 83.70 cm/s MV A velocity: 50.50 cm/s  SHUNTS MV E/A ratio:  1.66        Systemic VTI:  0.26 m Systemic Diam: 2.50 cm  Oneil Parchment MD Electronically signed by Oneil Parchment MD Signature Date/Time: 12/24/2022/12:04:07 PM    Final      CT SCANS  CT CORONARY MORPH W/CTA COR W/SCORE 12/26/2011  Narrative *RADIOLOGY REPORT*  INDICATION:  Ascending thoracic aortic aneurysm and aneurysmal dilatation of the aortic root.  Follow-up study.  CT ANGIOGRAPHY OF THE HEART, CORONARY ARTERY, STRUCTURE, AND MORPHOLOGY  CONTRAST:  100 ml of Omnipaque -300.  COMPARISON:  CTA chest 05/14/2011.  TECHNIQUE:  CT angiography of the coronary vessels was performed on a 256 channel system using prospective ECG gating.  A scout and noncontrast exam (for calcium scoring) were performed.  Circulation time was measured using a test bolus.  Coronary CTA was performed with sub mm slice collimation during portions of the cardiac cycle after prior injection of iodinated contrast.  Imaging post processing was performed on an independent workstation creating multiplanar and 3-D images, and quantitative analysis of the heart and coronary arteries.  Note that this exam targets the heart and the chest was not imaged in its entirety.  PREMEDICATION: Lopressor  5 mg,  IV Nitroglycerin  400 mcg, sublingual.  FINDINGS: Technical quality:  Good  Heart rate:  55-60  CORONARY ARTERIES: Left main coronary artery:  Not present (there are separate ostia for the left anterior descending and left circumflex coronary arteries directly off the left sinus of Valsalva). Left anterior descending:  Mildly diseased with mixed calcified noncalcified atherosclerotic plaque with areas of stenosis up to 25 - 50% in the mid LAD. Left circumflex:  Large caliber vessel that quickly gives rise to two large obtuse marginal vessels.  Proximal left circumflex demonstrates only minimal calcified plaque but no associated stenosis. Obtuse Marginal 1: Mildly diseased with calcified plaque with no associated luminal stenosis. Obtuse Marginal 2: Extensive calcified atherosclerotic plaque proximally with stenosis >50% (possibly >75%), however, secondary to the high degree of calcification, accurate assessment for luminal stenosis is not possible on this examination. Right coronary artery:  There is a stent in the proximal right coronary artery which is grossly patent.  There is some calcified plaque at the distal end of the stent where the vessel is slightly acutely angulated.  There appears to be some stenosis in this region, however, it is favored to be approximately 25 - 50% (accurate assessment is limited by the high-density the stent material and the heavily calcified plaque).  The remainder of the RCA is otherwise mildly diseased with nonobstructive plaque. Posterior descending artery:  Patent with a mixed calcified noncalcified plaque.  Secondary to the small size of the vessel and excessive image noise, accurate assessment for stenosis is not possible on this examination, however, there appears to be at least 25 - 50% stenosis of the mid PDA. Dominance:  Right  CORONARY CALCIUM: Total Agatston Score:  655 MESA database percentile:  80th Comment - it appears as  though the patient has moved or breathed during image acquisition, such that the a portion of the coronary arteries was incompletely visualized.  This likely underestimates the patient's true calcium score.  AORTA AND PULMONARY MEASUREMENTS: Aortic root (21 - 40 mm): 29 mm  at the annulus 49 mm  at the sinuses of Valsalva 36 mm  at the sinotubular junction Ascending aorta ( <  40 mm):  50 mm Aortic arch : 34 mm Descending aorta:  28 mm Main pulmonary artery:  ( <  30 mm):  35 mm  EXTRACARDIAC FINDINGS: Several tiny pulmonary nodules measuring 4 mm are scattered throughout the lungs bilaterally and are completely unchanged compared to prior examinations (dating back to 2007); these can be considered radiographically benign requiring no further imaging follow-up.  Small calcified granulomas are also noted in the posterior aspect of the left upper lobe.  Multiple calcified left hilar and mediastinal lymph nodes are noted.  No acute consolidative airspace disease.  No pleural effusions.  Visualized portions of the upper abdomen are unremarkable.  IMPRESSION:  1. Ascending thoracic aortic aneurysm is similar in size measuring 4.9 cm on today's examination.  No evidence of thoracic aortic dissection at this time. 2.  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  The patient's aortic valve appears to be tricuspid.  Additionally, there is no effacement of the sinotubular junction. 3.  Multivessel coronary artery disease, as detailed above.  Most importantly, there is a heavily calcified plaque in the obtuse marginal 2 branch (a large vessel) which appears to be stenotic (i.e., at least 50% diameter stenosis, and possbily in excess of 75%), however, accurate assessment for percentage stenosis is limited on this examination secondary to the high degree of calcification. Clinical correlation is recommended, with consideration  for further evaluation with stress testing if there is clinical concern for inducible ischemia. 4.  Coronary artery calcium scoring is at least 655 (the patient moved during the image acquisition, resulting in incomplete coverage of the coronary arteries which likely underestimates the true calcium score), which is 80th percentile for patient's of matched age, gender and race/ethnicity. 5.  Right coronary artery dominance.   Original Report Authenticated By: TORIBIO MICAEL AYE, M.D.     ______________________________________________________________________________________________     EKG: NSR, RBBB, LAFB I have independently reviewed the above radiologic studies and discussed with the patient   Recent Lab Findings: Lab Results  Component Value Date   WBC 6.1 02/24/2024   HGB 12.4 (L) 02/24/2024   HCT 39.3 02/24/2024   PLT 250 02/24/2024   GLUCOSE 111 (H) 03/26/2024   CHOL 184 11/14/2010   TRIG 143.0 11/14/2010   HDL 48.60 11/14/2010   LDLCALC 107 (H) 11/14/2010   ALT 16 03/26/2024   AST 21 03/26/2024   NA 144 03/26/2024   K 3.9 03/26/2024   CL 103 03/26/2024   CREATININE 1.23 03/26/2024   BUN 23 03/26/2024   CO2 26 03/26/2024   TSH 3.350 03/26/2024   INR 1.1 04/16/2022      Assessment / Plan:  83 y.o. male with known, stable root and ascending aortic aneurysm who has had progressively worsening shortness of breath over the last couple months.  He has had a LHC which did not demonstrate significant CAD.  His last ECHO in 2024 demonstrated moderate AI and I'm concerned that his AI has progressed.  He does not have a loud murmur on exam, and has a new ECHO pending 03/31/24.    Will follow-up TTE on 12/23 to evaluate AI.  If it is severe, we will bring him back for a discussion about surgical options and he will need a contrasted CT scan.  If unchanged, will probably continue to monitor with repeat ECHO and CT scan in 6-12 months.  Patient and his wife expressed  understanding and are in agreement with the plan.  I  spent 45 minutes counseling the patient face to face.   Con RAMAN Devarion Mcclanahan 03/27/2024 3:34 PM          [1]  Social History Tobacco Use  Smoking Status Former  Smokeless Tobacco Current   Types: Chew  Tobacco Comments   reports that when he smoked it was intermittent  [2]  Allergies Allergen Reactions   Promethazine Hcl Other (See Comments)    Agitation and incoherence (phenergan)   Atorvastatin     Other reaction(s): Myalgia   Colesevelam     Other Reaction(s): stopped due to constipation in 09/2021   Metformin  Hcl     Other Reaction(s): diarrhea, stomach pain, constipation   Promethazine Hcl     Other Reaction(s): went berzerk.  wild.   Solifenacin Succinate Dermatitis    Other Reaction(s): rash/itching

## 2024-03-31 ENCOUNTER — Ambulatory Visit (HOSPITAL_COMMUNITY)
Admission: RE | Admit: 2024-03-31 | Discharge: 2024-03-31 | Disposition: A | Discharge status: Home / Self Care | Source: Ambulatory Visit | Attending: Physician Assistant | Admitting: Physician Assistant

## 2024-03-31 DIAGNOSIS — I351 Nonrheumatic aortic (valve) insufficiency: Secondary | ICD-10-CM | POA: Insufficient documentation

## 2024-03-31 DIAGNOSIS — I25119 Atherosclerotic heart disease of native coronary artery with unspecified angina pectoris: Secondary | ICD-10-CM | POA: Insufficient documentation

## 2024-03-31 DIAGNOSIS — I7121 Aneurysm of the ascending aorta, without rupture: Secondary | ICD-10-CM | POA: Diagnosis present

## 2024-04-01 ENCOUNTER — Other Ambulatory Visit: Payer: Self-pay

## 2024-04-01 DIAGNOSIS — I7121 Aneurysm of the ascending aorta, without rupture: Secondary | ICD-10-CM

## 2024-04-01 LAB — ECHOCARDIOGRAM COMPLETE
AR max vel: 4.63 cm2
AV Area VTI: 4.49 cm2
AV Area mean vel: 4.42 cm2
AV Mean grad: 6.3 mmHg
AV Peak grad: 11.9 mmHg
Ao pk vel: 1.73 m/s
Area-P 1/2: 2.91 cm2
P 1/2 time: 502 ms
S' Lateral: 3.3 cm

## 2024-04-03 DIAGNOSIS — I351 Nonrheumatic aortic (valve) insufficiency: Secondary | ICD-10-CM | POA: Insufficient documentation

## 2024-04-03 NOTE — Addendum Note (Signed)
 Addended byBETHA FERRIER, GLENDIA T on: 04/03/2024 12:58 PM   Modules accepted: Orders

## 2024-04-05 NOTE — Progress Notes (Unsigned)
 "      OFFICE NOTE:    Date:  04/06/2024  ID:  Ronald [name], DOB September 23, 1940, MRN [id number] PCP: Shayne Anes, MD  [facility] Providers Cardiologist:  [name], MD Electrophysiologist:  OLE ONEIDA HOLTS, MD        Coronary artery disease          S/p stent to RCA in 2004 LHC 06/2011: pRCA stent patent, PDA 60 Myoview  4/22: low risk  LHC 03/16/24: pRCA stent patent w 10 ISR, pRCA 30, RPDA 60; mLCx 50; mLAD 50; LVEDP 21  Persistent atrial fibrillation Failed DCCV x 2 and failed Amiodarone  S/p PVI ablation in 2/21  Moderate Aortic Insufficiency  Echocardiogram 08/05/20: EF 55-60, no RWMA, normal RVSF, mild AI, moderate to severe dilation of aortic root (51 mm), dilated ascending aorta (50 mm)  TTE 12/24/2022: EF 60-65, no RWMA, GR 2 DD, severe LVE, LVIDd 7.0 cm, normal RVSF, moderate RVE, mild MR, moderate AI, aortic root 53 mm, ascending aorta 52 mm TTE 03/31/2024: EF 55-60, no RWMA, mild LVH, GRII DD, moderately reduced RVSF, moderate RVE, moderate RAE, trivial MR, moderate AI, AV sclerosis, severe dilation of the ascending aorta 50 mm, severe dilation of aortic root 53 mm  Bradycardia: no beta-blocker  Right Bundle Branch Block  Hypertension  Hyperlipidemia  Intol of statins  Ascending thoracic aortic aneurysm  Followed by CT surgery  CT 03/28/23: 4.9 cm CT 03/12/24: 4.9 cm  Aortic atherosclerosis PVCs GERD         Discussed the use of AI scribe software for clinical note transcription with the patient, who gave verbal consent to proceed. History of Present Illness Ronald [name] is a 83 y.o. male  He was seen 02/24/24 for progressively worsening chest pain and shortness of breath. I started him on Imdur  and arrange cardiac catheterization. This demonstrated patent RCA stent, mild to mod nonobstructive CAD in the LAD and mod nonobstructive CAD in the LCx. Med Rx was recommended for CAD. LVEDP was elevated and he was started on Lasix  20 mg once daily for 1  week, then prn. Follow up TTE demonstrated normal EF, mod AI, mod diastolic dysfunction, severe dilation of aortic root and ascending aorta.   He experiences ongoing shortness of breath. This is better with support, such as a grocery cart. He has not had chest pain, but he describes a sensation of his heart 'racing' during exertion. No wheezing or coughing. He can lay flat without difficulty. He experienced severe headaches with isosorbide  and discontinued it after two doses.  He reports chronic swelling in his L leg, which improves with support hose, and denies any recent worsening of symptoms. He is a retired Agricultural Engineer, and he has been less active over the past year due to his wife's health issues.    ROS-See HPI    Studies Reviewed:  EKG Interpretation Date/Time:  Monday April 06 2024 09:36:49 EST Ventricular Rate:  69 PR Interval:  176 QRS Duration:  174 QT Interval:  480 QTC Calculation: 514 R Axis:   -69  Text Interpretation: Sinus rhythm with Premature atrial complexes Right bundle branch block Left anterior fascicular block Bifascicular block No significant change since last tracing 03/26/24 Confirmed by Lelon Hamilton [phone]) on 04/06/2024 10:21:35 AM    LABS 03/26/24: K 3.9, SCr 1.23, ALT 21, Hgb 12.4, PLT 250K, TSH 3.35  Risk Assessment/Calculations: CHA2DS2-VASc Score = 4   This indicates a 4.8% annual risk of stroke. The patient's  score is based upon: CHF History: 0 HTN History: 1 Diabetes History: 0 Stroke History: 0 Vascular Disease History: 1 Age Score: 2 Gender Score: 0           Physical Exam:  VS:  BP 116/60 (BP Location: Right Arm, Patient Position: Sitting, Cuff Size: Large)   Pulse 69   Ht 6' 7 (2.007 m)   Wt 274 lb (124.3 kg)   SpO2 95%   BMI 30.87 kg/m        Wt Readings from Last 3 Encounters:  04/06/24 274 lb (124.3 kg)  03/27/24 278 lb 9.6 oz (126.4 kg)  03/26/24 280 lb (127 kg)    Constitutional:       Appearance: Healthy appearance. Not in distress.  Neck:     Vascular: No JVR. JVD normal.  Pulmonary:     Breath sounds: Normal breath sounds. No wheezing. No rales.  Cardiovascular:     Normal rate. Regular rhythm.     Murmurs: There is a grade 2/4 diastolic murmur at the LLSB.     Comments: R groin w/o hematoma, bruit Edema:    Peripheral edema absent.  Abdominal:     Palpations: Abdomen is soft.       Assessment and Plan:    Assessment & Plan Precordial chest pain Shortness of breath He had a patent RCA stent and mild to mod nonobstructive CAD. LVEDP was elevated and he was started on Lasix  20 mg once daily. I placed him on Imdur  at last OV prior to his cardiac catheterization. He could not tolerate this due to HAs and stopped it. He has not noticed much improvement since starting on Lasix . He continues to have dyspnea on exertion and chest pain, with exertion. He had a recent CT with Dr. Daniel of CT surgery. No parenchymal changes were noted on this study to suggest Amio lung toxicity. His O2 sats are normal. His TTE showed mod AI. He does not appear volume overloaded on exam today. He does note that his wife had an ankle issue that limited her activity for the past year. They used to walk together every day. He has not done the same activity for the last year and wonders if deconditioning may be contributing. I think this is very real possibility. - Obtain follow up BMET, BNP today - I will discuss with Dr. Verlin and Dr. Cindie +/- workup for Amio lung toxicity - I have forwarded his echocardiogram to Dr. Daniel and Dr. Verlin for review as well. - Follow up with Dr. Verlin in 6 mos.  Coronary artery disease involving native coronary artery of native heart with angina pectoris History of prior PCI to the RCA.  Recent cardiac catheterization with patent RCA stent and mod nonobs disease elsewhere. Med Rx continued. As noted, he continues to have exertional chest pain and shortness of  breath. He could not tolerate Imdur  due to HAs. He recently reduced his dose of Amlodipine  from 5 to 2.5 mg. We could consider addition of beta-blocker, Ranolazine. I recommend increasing his Amlodipine  for now. - Increase Amlodipine  to 5 mg once daily  - Continue NTG prn, Pitavastatin 2 mg once daily  - Follow up 6 mos Chronic heart failure with preserved ejection fraction (HFpEF) (HCC) EF normal by recent Echocardiogram. LVEDP was elevated. He was placed on Lasix  20 mg once daily. Plan was for 1 week then prn. He is still taking it. We discussed the rationale for SGLT2 inhib. We can consider adding this at  some point in the future. - BMET, BNP today - If BNP is very high, I will go ahead and add Jardiance now.  Nonrheumatic aortic valve insufficiency Mod AI by recent TTE. As noted, I have forwarded this to Dr. Verlin and Dr. Daniel to review. - Continue annual surveillance. Aneurysm of ascending aorta without rupture 4.9 cm by recent CT. He is followed by CT surgery.   Persistent atrial fibrillation (HCC) S/p PVI ablation in 2021. He is maintaining NSR. Recent LFTs, TFTs normal. As noted, I doubt Amio lung toxicity as a cause for his symptoms based upon recent CT scan. I will review with Dr. Merlyn and Dr. Verlin.        Dispo:  Return in about 6 months (around 10/05/2024) for Routine Follow Up, w/ Dr. Verlin.  Signed, Glendia Ferrier, PA-C   "

## 2024-04-05 NOTE — Assessment & Plan Note (Signed)
 4.9 cm by recent CT. He is followed by CT surgery. ***

## 2024-04-05 NOTE — Assessment & Plan Note (Signed)
 S/p PVI ablation in 2021. He is maintaining NSR. Recent LFTs, TFTs normal. As noted, I doubt Amio lung toxicity as a cause for his symptoms based upon recent CT scan. I will review with Dr. Merlyn and Dr. Verlin.

## 2024-04-05 NOTE — Assessment & Plan Note (Signed)
 History of prior PCI to the RCA.  Recent cardiac catheterization with patent RCA stent and mod nonobs disease elsewhere. Med Rx continued. As noted, he continues to have exertional chest pain and shortness of breath. He could not tolerate Imdur  due to HAs. He recently reduced his dose of Amlodipine  from 5 to 2.5 mg. We could consider addition of beta-blocker, Ranolazine. I recommend increasing his Amlodipine  for now. - Increase Amlodipine  to 5 mg once daily  - Continue NTG prn, Pitavastatin 2 mg once daily  - Follow up 6 mos

## 2024-04-05 NOTE — Assessment & Plan Note (Signed)
 Mod AI by recent TTE. As noted, I have forwarded this to Dr. Verlin and Dr. Daniel to review. - Continue annual surveillance.

## 2024-04-06 ENCOUNTER — Encounter: Payer: Self-pay | Admitting: *Deleted

## 2024-04-06 ENCOUNTER — Encounter: Payer: Self-pay | Admitting: Physician Assistant

## 2024-04-06 ENCOUNTER — Ambulatory Visit: Attending: Physician Assistant | Admitting: Physician Assistant

## 2024-04-06 VITALS — BP 116/60 | HR 69 | Ht 79.0 in | Wt 274.0 lb

## 2024-04-06 DIAGNOSIS — I25119 Atherosclerotic heart disease of native coronary artery with unspecified angina pectoris: Secondary | ICD-10-CM

## 2024-04-06 DIAGNOSIS — R072 Precordial pain: Secondary | ICD-10-CM

## 2024-04-06 DIAGNOSIS — I351 Nonrheumatic aortic (valve) insufficiency: Secondary | ICD-10-CM | POA: Diagnosis not present

## 2024-04-06 DIAGNOSIS — I5032 Chronic diastolic (congestive) heart failure: Secondary | ICD-10-CM | POA: Diagnosis not present

## 2024-04-06 DIAGNOSIS — R0602 Shortness of breath: Secondary | ICD-10-CM | POA: Diagnosis not present

## 2024-04-06 DIAGNOSIS — I7121 Aneurysm of the ascending aorta, without rupture: Secondary | ICD-10-CM | POA: Diagnosis not present

## 2024-04-06 DIAGNOSIS — I4819 Other persistent atrial fibrillation: Secondary | ICD-10-CM | POA: Diagnosis not present

## 2024-04-06 LAB — BASIC METABOLIC PANEL WITH GFR
BUN/Creatinine Ratio: 21 (ref 10–24)
BUN: 26 mg/dL (ref 8–27)
CO2: 25 mmol/L (ref 20–29)
Calcium: 9.1 mg/dL (ref 8.6–10.2)
Chloride: 102 mmol/L (ref 96–106)
Creatinine, Ser: 1.24 mg/dL (ref 0.76–1.27)
Glucose: 243 mg/dL — ABNORMAL HIGH (ref 70–99)
Potassium: 3.7 mmol/L (ref 3.5–5.2)
Sodium: 141 mmol/L (ref 134–144)
eGFR: 58 mL/min/1.73 — ABNORMAL LOW

## 2024-04-06 LAB — PRO B NATRIURETIC PEPTIDE: NT-Pro BNP: 128 pg/mL (ref 0–486)

## 2024-04-06 MED ORDER — AMLODIPINE BESYLATE 5 MG PO TABS: 5.0000 mg | ORAL_TABLET | Freq: Every day | ORAL | 3 refills | Status: AC

## 2024-04-06 NOTE — Patient Instructions (Addendum)
 Medication Instructions:  START BACK TAKING AMLODIPINE  5 MG DAILY.  Lab Work: NUTRITIONAL THERAPIST AND BNP TO BO DONE TODAY.  Testing/Procedures: NONE  Follow-Up: At [facility], you and your health needs are our priority.  As part of our continuing mission to provide you with exceptional heart care, our providers are all part of one team.  This team includes your primary Cardiologist (physician) and Advanced Practice Providers or APPs (Physician Assistants and Nurse Practitioners) who all work together to provide you with the care you need, when you need it.  Your next appointment:   6 MONTHS  Provider:   [name], MD

## 2024-04-07 ENCOUNTER — Ambulatory Visit: Payer: Self-pay | Admitting: Physician Assistant

## 2024-04-08 ENCOUNTER — Ambulatory Visit (HOSPITAL_COMMUNITY)
Admission: RE | Admit: 2024-04-08 | Discharge: 2024-04-08 | Disposition: A | Discharge status: Home / Self Care | Source: Ambulatory Visit

## 2024-04-08 DIAGNOSIS — I7121 Aneurysm of the ascending aorta, without rupture: Secondary | ICD-10-CM | POA: Insufficient documentation

## 2024-04-08 MED ORDER — IOHEXOL 350 MG/ML SOLN
75.0000 mL | Freq: Once | INTRAVENOUS | Status: AC | PRN
  Administered 2024-04-08: 75 mL via INTRAVENOUS

## 2024-04-13 ENCOUNTER — Ambulatory Visit: Payer: Self-pay

## 2024-05-07 ENCOUNTER — Telehealth: Payer: Self-pay | Admitting: Cardiovascular Disease

## 2024-05-07 NOTE — Telephone Encounter (Signed)
" ° °  Pt c/o of Chest Pain: STAT if active CP, including tightness, pressure, jaw pain, radiating pain to shoulder/upper arm/back, CP unrelieved by Nitro. Symptoms reported of SOB, nausea, vomiting, sweating.  1. Are you having CP right now? Yes     2. Are you experiencing any other symptoms (ex. SOB, nausea, vomiting, sweating)? Heartburn, nausea. Pt states he hasn't had heartburn in a very long time    3. Is your CP continuous or coming and going? Coming and going    4. Have you taken Nitroglycerin ? No    5. How long have you been experiencing CP? 4-5 days     6. If NO CP at time of call then end call with telling Pt to call back or call 911 if Chest pain returns prior to return call from triage team.  Pt has been having chest pain for about for days. He has been to ED and also states he's had every test known to man. Pt asking if Dr. Verlin could look back over his heart CT and see if anything was missed. Please advise.  "

## 2024-05-07 NOTE — Telephone Encounter (Signed)
 Received triage call from patient with c/o CP for 4-5 days. States he has been to ER and had every test possible done, CT, XR, echo, etc. Pt asking if Dr Verlin can look over everything and make sure nothing has been missed.  Pt has hx heartburn- takes Sucralfate and just increased dose today.  Pt had CP at PCP visit recently and given Sucralfate. States after he belches it improves. Didn't realize he was supposed to take 4 until today.  Pt currently having L dull intermittent chest pain. ED precautions given to patient and hesitant to go up there and sit 14-16 hours for the same tests.   Will send to Dr Verlin and Glendia Ferrier, PA for further recommendations.

## 2024-05-08 NOTE — Telephone Encounter (Signed)
 If sucralfate is improving his chest pain, would think this is all GI. Make sure he follow up with PCP or GI (whoever Rx'd the Sucralfate) as well.  Dr. Daniel recently looked at his echocardiogram and AI is not bad enough to repair/replace aortic valve. His cardiac catheterization did not show significant CAD. The only abnormality was elevated filling pressures at the time of his cardiac catheterization. Volume overload can contribute to similar symptoms. CT did not show any changes that would raise concern for amio lung toxicity.   How is he currently taking Lasix ? Is he more short of breath? Any leg edema? Any orthopnea? Weight gain in the last 1-2 weeks.   Glendia Ferrier, PA-C    05/08/2024 1:39 PM

## 2024-05-08 NOTE — Telephone Encounter (Signed)
 Received incoming call from pt. He was able to have a Bowel Movement, and is now calling back to discuss the Chest pains.    Has been belching a lot and having Chest pains since this past Monday or Tuesday (05/04/2024 or 05/05/2024). The CP's are about the same as yesterday. He's not sure if maybe he over exerted himself because (before experiencing the CP's) he had walked half a mile, then another 0.75 mi, then skipped one day, then the next day he lifted some weights, then afterward began having the belching and CP's. He sometimes can feel a little more short of breath, but is able to go to the grocery store as long as he is pushing the cart around.  No recent swelling. (Uses compression hose and watches sodium intake and this controls his Left leg swelling) He does take Lasix  20 mg, once daily. Also takes other Cardiac medications as prescribed.   Weight on Christmas Day=277 lbs.  Weight today=262 lbs (has been cutting out sweets)  Recent CBG's: 139, 146, 134  Denies Orthopnea.   Recent BP's: 140/81 123/79 124/76 142/74  He has been on Sucralfate for last 3-5 years. He does have a GI doctor. PCP recently prescribed Iron supplement that he will be taking twice weekly. Advised pt to get in touch with his GI provider and we would inform him of any other rec's on behalf of Cardiology. ER precautions reviewed with pt who verbalized understanding.

## 2024-05-08 NOTE — Telephone Encounter (Signed)
 Called and spoke to pt. He is getting ready to do a Fleets Enema cause I'm impacted. His wife is with him but she has Alzheimer's and he needs to get his medication list out in front of him.   Informed pt that I will call him back in about 15-20 min.

## 2024-05-11 NOTE — Telephone Encounter (Signed)
 If he is having GI symptoms (belching) with his chest pain, would recommend he reach out to PCP or GI. If he ever has a change in his chest pain symptoms or worsening symptoms, would recommend he go to the ED. Glendia Ferrier, PA-C    05/11/2024 9:53 AM

## 2024-05-18 ENCOUNTER — Ambulatory Visit: Admitting: Physician Assistant
